# Patient Record
Sex: Male | Born: 1945
Health system: Southern US, Community
[De-identification: ages and names within clinical notes are randomized; demographics above are authoritative.]

## PROBLEM LIST (undated history)

## (undated) DIAGNOSIS — K2270 Barrett's esophagus without dysplasia: Secondary | ICD-10-CM

## (undated) DIAGNOSIS — Z8711 Personal history of peptic ulcer disease: Secondary | ICD-10-CM

## (undated) DIAGNOSIS — I10 Essential (primary) hypertension: Secondary | ICD-10-CM

## (undated) DIAGNOSIS — F32A Depression, unspecified: Secondary | ICD-10-CM

## (undated) DIAGNOSIS — Z87442 Personal history of urinary calculi: Secondary | ICD-10-CM

## (undated) DIAGNOSIS — R7611 Nonspecific reaction to tuberculin skin test without active tuberculosis: Secondary | ICD-10-CM

## (undated) DIAGNOSIS — I2510 Atherosclerotic heart disease of native coronary artery without angina pectoris: Secondary | ICD-10-CM

## (undated) DIAGNOSIS — G4730 Sleep apnea, unspecified: Secondary | ICD-10-CM

## (undated) DIAGNOSIS — N529 Male erectile dysfunction, unspecified: Secondary | ICD-10-CM

## (undated) DIAGNOSIS — I671 Cerebral aneurysm, nonruptured: Secondary | ICD-10-CM

## (undated) DIAGNOSIS — E7800 Pure hypercholesterolemia, unspecified: Secondary | ICD-10-CM

## (undated) DIAGNOSIS — K219 Gastro-esophageal reflux disease without esophagitis: Secondary | ICD-10-CM

## (undated) DIAGNOSIS — K635 Polyp of colon: Secondary | ICD-10-CM

## (undated) DIAGNOSIS — Z8719 Personal history of other diseases of the digestive system: Secondary | ICD-10-CM

## (undated) DIAGNOSIS — N400 Enlarged prostate without lower urinary tract symptoms: Secondary | ICD-10-CM

## (undated) DIAGNOSIS — F419 Anxiety disorder, unspecified: Secondary | ICD-10-CM

## (undated) DIAGNOSIS — N189 Chronic kidney disease, unspecified: Secondary | ICD-10-CM

## (undated) HISTORY — DX: Benign prostatic hyperplasia without lower urinary tract symptoms: N40.0

## (undated) HISTORY — DX: Nonspecific reaction to tuberculin skin test without active tuberculosis: R76.11

## (undated) HISTORY — DX: Personal history of peptic ulcer disease: Z87.11

## (undated) HISTORY — DX: Gastro-esophageal reflux disease without esophagitis: K21.9

## (undated) HISTORY — PX: HERNIA REPAIR: SHX51

## (undated) HISTORY — DX: Anxiety disorder, unspecified: F41.9

## (undated) HISTORY — PX: CATARACT EXTRACTION W/ INTRAOCULAR LENS  IMPLANT, BILATERAL: SHX1307

## (undated) HISTORY — DX: Pure hypercholesterolemia, unspecified: E78.00

## (undated) HISTORY — DX: Polyp of colon: K63.5

## (undated) HISTORY — DX: Personal history of other diseases of the digestive system: Z87.19

## (undated) HISTORY — DX: Male erectile dysfunction, unspecified: N52.9

## (undated) HISTORY — DX: Essential (primary) hypertension: I10

## (undated) HISTORY — DX: Barrett's esophagus without dysplasia: K22.70

---

## 1968-02-08 HISTORY — PX: APPENDECTOMY: SHX54

## 1997-02-07 HISTORY — PX: PROSTATE SURGERY: SHX751

## 1997-10-03 ENCOUNTER — Encounter: Admission: RE | Admit: 1997-10-03 | Discharge: 1997-10-03 | Payer: Self-pay | Admitting: Family Medicine

## 1997-10-20 ENCOUNTER — Encounter: Admission: RE | Admit: 1997-10-20 | Discharge: 1997-10-20 | Payer: Self-pay | Admitting: Family Medicine

## 1997-11-03 ENCOUNTER — Emergency Department (HOSPITAL_COMMUNITY): Admission: EM | Admit: 1997-11-03 | Discharge: 1997-11-03 | Payer: Self-pay | Admitting: Emergency Medicine

## 1998-02-19 ENCOUNTER — Encounter: Payer: Self-pay | Admitting: Urology

## 1998-02-23 ENCOUNTER — Ambulatory Visit (HOSPITAL_COMMUNITY): Admission: RE | Admit: 1998-02-23 | Discharge: 1998-02-23 | Payer: Self-pay | Admitting: Urology

## 2003-07-28 ENCOUNTER — Ambulatory Visit (HOSPITAL_COMMUNITY): Admission: RE | Admit: 2003-07-28 | Discharge: 2003-07-28 | Payer: Self-pay | Admitting: Gastroenterology

## 2003-07-28 ENCOUNTER — Encounter (INDEPENDENT_AMBULATORY_CARE_PROVIDER_SITE_OTHER): Payer: Self-pay | Admitting: Specialist

## 2003-12-07 ENCOUNTER — Emergency Department (HOSPITAL_COMMUNITY): Admission: EM | Admit: 2003-12-07 | Discharge: 2003-12-07 | Payer: Self-pay | Admitting: *Deleted

## 2005-07-13 ENCOUNTER — Ambulatory Visit: Admission: RE | Admit: 2005-07-13 | Discharge: 2005-07-13 | Payer: Self-pay | Admitting: Specialist

## 2008-11-19 ENCOUNTER — Encounter: Admission: RE | Admit: 2008-11-19 | Discharge: 2008-11-19 | Payer: Self-pay | Admitting: Otolaryngology

## 2010-06-25 NOTE — Op Note (Signed)
NAMESAMAJ, WESSELLS                          ACCOUNT NO.:  [id number]   MEDICAL RECORD NO.:  [id number]                   PATIENT TYPE:  AMB   LOCATION:  ENDO                                 FACILITY:  [facility]   PHYSICIAN:  Petra Kuba, M.D.                 DATE OF BIRTH:  [birth year]   DATE OF PROCEDURE:  07/28/2003  DATE OF DISCHARGE:                                 OPERATIVE REPORT   PROCEDURE:  Colonoscopy.   INDICATION:  Polyp on flexible sigmoidoscopy.  Consent was signed after  risks, benefits, methods, options thoroughly discussed in the office.   MEDICINES USED:  1. Demerol 60.  2. Versed 6.   DESCRIPTION OF PROCEDURE:  Rectal inspection was pertinent for external  hemorrhoids, small.  Digital exam was negative.  The video colonoscope was  inserted, easily advanced around the colon to the cecum.  This did require  some abdominal pressure but no position changes.  On insertion in the  rectum, a small polyp was seen, possibly the one seen by Dr. Janey Greaser but no  other abnormalities but some left-sided diverticula.  The cecum was  identified by the appendiceal orifice and the ileocecal valve.  The prep was  adequate.  There was some liquid stool that required washing and suctioning.  On slow withdrawal through the colon, the appendiceal orifice possibly had a  little bit of polypoid tissue there, and a few cold biopsies were obtained  just to make sure.  The scope was further withdrawn.  In the ascending  colon, 2 polyps on folds right next to each other were seen and each hot  biopsied x 1 or 2 and put in a second container.  The scope was further  withdrawn.  The transverse was normal.  In the descending and proximal  sigmoid, 2 questionable tiny polyps were seen and were both hot biopsied and  put in a third container.  The scope was withdrawn back to the distal  sigmoid, and multiple tiny to small probable hyperplastic-appearing polyps  were seen and were all hot  biopsied and put in the fourth container.  No  other polypoid lesions were seen.  One of these was probably the one seen by  Dr. Janey Greaser on his sigmoidoscopy.  Anorectal pull-through and retroflexion  confirmed some small hemorrhoids.  The scope was reinserted a short ways up  the left side of the colon; air was suctioned and the scope removed.  The  patient tolerated the procedure well.  There was no obvious immediate  complication.   ENDOSCOPIC DIAGNOSES:  1. Internal and external hemorrhoids.  2. Left-sided diverticula.  3. Few rectal and distal sigmoid tiny to small polyps, hot biopsied.  4. Two tiny proximal sigmoid and mid descending questionable polyps, hot     biopsied.  5. Ascending 2 small polyps, hot biopsied.  6. Questionable appendiceal orifice with polypoid tissue, cold biopsied.  7. Otherwise, within normal limits to the cecum.   PLAN:  1. Await pathology to determine future colonic screening.  2. Otherwise, return care to Dr. Janey Greaser for the customary health care     maintenance to include yearly rectals and guaiacs.  3. Otherwise, happy to see back p.r.n.Petra Kuba, M.D.    MEM/MEDQ  D:  07/28/2003  T:  07/28/2003  Job:  [id number]   cc:   Al Decant. Janey Greaser, MD  [address]  [place]  [place] [address]  Fax: [phone]

## 2011-04-14 ENCOUNTER — Other Ambulatory Visit: Payer: Self-pay | Admitting: Dermatology

## 2012-10-08 HISTORY — PX: COLONOSCOPY: SHX174

## 2012-10-08 HISTORY — PX: ESOPHAGOGASTRODUODENOSCOPY ENDOSCOPY: SHX5814

## 2012-10-15 LAB — HM COLONOSCOPY

## 2014-03-10 ENCOUNTER — Encounter: Payer: Self-pay | Admitting: *Deleted

## 2014-03-10 ENCOUNTER — Ambulatory Visit (INDEPENDENT_AMBULATORY_CARE_PROVIDER_SITE_OTHER): Payer: 59 | Admitting: Internal Medicine

## 2014-03-10 ENCOUNTER — Encounter: Payer: Self-pay | Admitting: Internal Medicine

## 2014-03-10 VITALS — BP 118/80 | HR 77 | Ht 69.0 in | Wt 178.8 lb

## 2014-03-10 DIAGNOSIS — R0609 Other forms of dyspnea: Secondary | ICD-10-CM

## 2014-03-10 NOTE — Patient Instructions (Signed)
Your physician recommends that you continue on your current medications as directed. Please refer to the Current Medication list given to you today. Your physician recommends that you schedule a follow-up appointment as needed with Dr. Ross.   

## 2014-03-10 NOTE — Progress Notes (Signed)
HPI Patient is a 69 yo who is referred for evaluation of fatigue  Different from what she has experienced in past.  Increased DOE After work gets home  Exhausted  Wants to sleep  Doesn't   Has cramps/pain in L leg (thigh) when does stairs.   Since seen by Dr Harrington Challenger has [facility]  Does troined [facility] 3.5 to 4 for 30 min   Does do some jogging on track Then gets on to  wts   Doing well with this   Labs done in Dec  CBC, TSH normla  Cr 1.5   Allergies  Allergen Reactions  . Penicillins Rash    Current Outpatient Prescriptions  Medication Sig Dispense Refill  . atorvastatin (LIPITOR) 40 MG tablet Take 40 mg by mouth daily.    . calcium carbonate (TUMS - DOSED IN MG ELEMENTAL CALCIUM) 500 MG chewable tablet Chew 1 tablet by mouth as needed for indigestion or heartburn.    . clonazePAM (KLONOPIN) 1 MG tablet Take 1 mg by mouth 2 (two) times daily. Take 1/2 to 1 tablet every 12 hours as needed for severe anxiety.    . hydrochlorothiazide (HYDRODIURIL) 25 MG tablet Take 25 mg by mouth daily.    Marland Kitchen ibuprofen (ADVIL,MOTRIN) 200 MG tablet Take 200 mg by mouth 2 (two) times daily. Pt states that he takes 2 tablets twice daily.    . Multiple Vitamin (MULTIVITAMIN) tablet Take 1 tablet by mouth daily.    Marland Kitchen omeprazole (PRILOSEC) 20 MG capsule Take 20 mg by mouth daily.    . tamsulosin (FLOMAX) 0.4 MG CAPS capsule Take 0.4 mg by mouth daily after breakfast.    . zaleplon (SONATA) 10 MG capsule Take 10 mg by mouth at bedtime as needed for sleep.     No current facility-administered medications for this visit.    Past Medical History  Diagnosis Date  . Hypertension   . Hypercholesteremia   . Anxiety   . BPH (benign prostatic hyperplasia)   . Erectile dysfunction   . GERD (gastroesophageal reflux disease)     Past Surgical History  Procedure Laterality Date  . Hernia repair    . Appendectomy    . Esophagogastroduodenoscopy endoscopy  10/2012  . Colonoscopy  10/2012    Family History   Problem Relation Age of Onset  . Aneurysm Father   . Breast cancer Sister   . Colon cancer Sister     History   Social History  . Marital Status: Married    Spouse Name: pamela    Number of Children: 2  . Years of Education: 12   Occupational History  . [employer].    Social History Main Topics  . Smoking status: Former Smoker    Quit date: 03/10/1996  . Smokeless tobacco: Not on file  . Alcohol Use: No  . Drug Use: No  . Sexual Activity: Not on file   Other Topics Concern  . Not on file   Social History Narrative    Review of Systems:  All systems reviewed.  They are negative to the above problem except as previously stated.  Vital Signs: BP 118/80 mmHg  Pulse 77  Ht 5\' 9"  (1.753 m)  Wt 178 lb 12.8 oz (81.103 kg)  BMI 26.39 kg/m2  Physical Exam  HEENT:  Normocephalic, atraumatic. EOMI, PERRLA.  Neck: JVP is normal.  No bruits.  Lungs: clear to auscultation. No rales no wheezes.  Heart: Regular rate and rhythm. Normal S1, S2.  No S3.   No significant murmurs. PMI not displaced.  Abdomen:  Supple, nontender. Normal bowel sounds. No masses. No hepatomegaly.  Extremities:   Good distal pulses throughout. No lower extremity edema.  Musculoskeletal :moving all extremities.  Neuro:   alert and oriented x3.  CN II-XII grossly intact.  EKG  SR 77 bpm   Assessment and Plan:  1  Fatigue, dyspnea.  I am not convinced cardiac in origin  He is going to gym  Doing OK with this  I would not sched any cardiac testing unless symptoms change  2.  Lipids  Did not get LDL but total chol was 152, HT< 40  Trig 125  Cannot be high     F/U Prn

## 2014-04-07 ENCOUNTER — Encounter: Payer: Self-pay | Admitting: Podiatry

## 2014-04-07 ENCOUNTER — Ambulatory Visit (INDEPENDENT_AMBULATORY_CARE_PROVIDER_SITE_OTHER): Payer: 59 | Admitting: Podiatry

## 2014-04-07 ENCOUNTER — Ambulatory Visit: Payer: 59

## 2014-04-07 ENCOUNTER — Ambulatory Visit (INDEPENDENT_AMBULATORY_CARE_PROVIDER_SITE_OTHER): Payer: 59

## 2014-04-07 VITALS — BP 142/80 | HR 87 | Resp 12

## 2014-04-07 DIAGNOSIS — M779 Enthesopathy, unspecified: Secondary | ICD-10-CM

## 2014-04-07 DIAGNOSIS — R52 Pain, unspecified: Secondary | ICD-10-CM | POA: Diagnosis not present

## 2014-04-07 MED ORDER — DICLOFENAC SODIUM 75 MG PO TBEC: 75.0000 mg | DELAYED_RELEASE_TABLET | Freq: Two times a day (BID) | ORAL | Status: DC

## 2014-04-07 MED ORDER — TRIAMCINOLONE ACETONIDE 10 MG/ML IJ SUSP
10.0000 mg | Freq: Once | INTRAMUSCULAR | Status: AC
  Administered 2014-04-07: 10 mg

## 2014-04-07 NOTE — Progress Notes (Signed)
   Subjective:    Patient ID: John Salazar, male    DOB: 04/04/1945, 69 y.o.   MRN: [id number]  HPI  PT STATED LT LATERAL SIDE OF THE FOOT IS PAINFUL AND HAVE BURNING SENSATION. LT FOOT IS GETTING WORKING, ESPECIALLY WHEN WALKING/STANDING. TRIED NO TREATMENT.  Review of Systems  All other systems reviewed and are negative.      Objective:   Physical Exam        Assessment & Plan:

## 2014-04-08 NOTE — Progress Notes (Signed)
Subjective:     Patient ID: John Salazar, male   DOB: 1945-12-15, 69 y.o.   MRN: [id number]  HPI patient states that both his feet hurt but the left is worse on the outside and he does not remember specific injury. States his orthotics have worn out   Review of Systems  All other systems reviewed and are negative.      Objective:   Physical Exam  Cardiovascular: Intact distal pulses.   Musculoskeletal: Normal range of motion.  Neurological: He is alert.  Skin: Skin is warm and dry.  Nursing note and vitals reviewed.  neurovascular status intact with muscle strength adequate range of motion subtalar midtarsal joint was adequate. Patient's noted to have good digital perfusion and is well oriented 3 and does have mild equinus condition. There is quite a bit of discomfort in the lateral side of the left over right foot proximal to the  metatarsal head with inflammation occurring upon palpation and pain    Assessment:     Probable tendinitis with instability of the foot structure and orthotics which have lost some of their support mechanism    Plan:     H&P and x-rays reviewed. At this point I did careful lateral injection left 3 mg Kenalog 5 mg Xylocaine and scanned for custom orthotics to reduce stress against his feet. Reappoint when they are returned or earlier if any issues should occur

## 2014-04-29 ENCOUNTER — Ambulatory Visit: Payer: 59 | Admitting: *Deleted

## 2014-04-29 DIAGNOSIS — M779 Enthesopathy, unspecified: Secondary | ICD-10-CM

## 2014-04-29 NOTE — Patient Instructions (Signed)

## 2014-04-29 NOTE — Progress Notes (Signed)
Patient ID: John Salazar, male   DOB: [birth year], 69 y.o.   MRN: [id number] PICKING UP INSERTS

## 2014-11-28 ENCOUNTER — Other Ambulatory Visit: Payer: Self-pay | Admitting: Gastroenterology

## 2015-07-23 DIAGNOSIS — H01022 Squamous blepharitis right lower eyelid: Secondary | ICD-10-CM | POA: Diagnosis not present

## 2015-07-23 DIAGNOSIS — H25813 Combined forms of age-related cataract, bilateral: Secondary | ICD-10-CM | POA: Diagnosis not present

## 2015-07-23 DIAGNOSIS — H01021 Squamous blepharitis right upper eyelid: Secondary | ICD-10-CM | POA: Diagnosis not present

## 2015-07-23 DIAGNOSIS — H01024 Squamous blepharitis left upper eyelid: Secondary | ICD-10-CM | POA: Diagnosis not present

## 2015-07-23 DIAGNOSIS — H01025 Squamous blepharitis left lower eyelid: Secondary | ICD-10-CM | POA: Diagnosis not present

## 2015-07-23 DIAGNOSIS — H5703 Miosis: Secondary | ICD-10-CM | POA: Diagnosis not present

## 2015-07-29 DIAGNOSIS — J0100 Acute maxillary sinusitis, unspecified: Secondary | ICD-10-CM | POA: Diagnosis not present

## 2015-08-05 DIAGNOSIS — L821 Other seborrheic keratosis: Secondary | ICD-10-CM | POA: Diagnosis not present

## 2015-08-05 DIAGNOSIS — B351 Tinea unguium: Secondary | ICD-10-CM | POA: Diagnosis not present

## 2015-08-05 DIAGNOSIS — L820 Inflamed seborrheic keratosis: Secondary | ICD-10-CM | POA: Diagnosis not present

## 2015-08-05 DIAGNOSIS — L814 Other melanin hyperpigmentation: Secondary | ICD-10-CM | POA: Diagnosis not present

## 2015-08-05 DIAGNOSIS — D225 Melanocytic nevi of trunk: Secondary | ICD-10-CM | POA: Diagnosis not present

## 2015-08-05 DIAGNOSIS — L570 Actinic keratosis: Secondary | ICD-10-CM | POA: Diagnosis not present

## 2015-08-10 DIAGNOSIS — H2512 Age-related nuclear cataract, left eye: Secondary | ICD-10-CM | POA: Diagnosis not present

## 2015-08-18 DIAGNOSIS — H2511 Age-related nuclear cataract, right eye: Secondary | ICD-10-CM | POA: Diagnosis not present

## 2015-08-24 DIAGNOSIS — H21561 Pupillary abnormality, right eye: Secondary | ICD-10-CM | POA: Diagnosis not present

## 2015-08-24 DIAGNOSIS — H25811 Combined forms of age-related cataract, right eye: Secondary | ICD-10-CM | POA: Diagnosis not present

## 2015-08-24 DIAGNOSIS — H2511 Age-related nuclear cataract, right eye: Secondary | ICD-10-CM | POA: Diagnosis not present

## 2015-08-24 DIAGNOSIS — H2181 Floppy iris syndrome: Secondary | ICD-10-CM | POA: Diagnosis not present

## 2015-09-08 DIAGNOSIS — E559 Vitamin D deficiency, unspecified: Secondary | ICD-10-CM | POA: Diagnosis not present

## 2015-09-08 DIAGNOSIS — Z0000 Encounter for general adult medical examination without abnormal findings: Secondary | ICD-10-CM | POA: Diagnosis not present

## 2015-11-26 DIAGNOSIS — A09 Infectious gastroenteritis and colitis, unspecified: Secondary | ICD-10-CM | POA: Diagnosis not present

## 2015-11-26 DIAGNOSIS — Z86010 Personal history of colonic polyps: Secondary | ICD-10-CM | POA: Diagnosis not present

## 2015-11-26 DIAGNOSIS — K2210 Ulcer of esophagus without bleeding: Secondary | ICD-10-CM | POA: Diagnosis not present

## 2015-11-26 DIAGNOSIS — K210 Gastro-esophageal reflux disease with esophagitis: Secondary | ICD-10-CM | POA: Diagnosis not present

## 2015-12-03 DIAGNOSIS — L918 Other hypertrophic disorders of the skin: Secondary | ICD-10-CM | POA: Diagnosis not present

## 2015-12-03 DIAGNOSIS — B351 Tinea unguium: Secondary | ICD-10-CM | POA: Diagnosis not present

## 2015-12-03 DIAGNOSIS — D225 Melanocytic nevi of trunk: Secondary | ICD-10-CM | POA: Diagnosis not present

## 2015-12-03 DIAGNOSIS — L570 Actinic keratosis: Secondary | ICD-10-CM | POA: Diagnosis not present

## 2015-12-03 DIAGNOSIS — L821 Other seborrheic keratosis: Secondary | ICD-10-CM | POA: Diagnosis not present

## 2016-01-20 DIAGNOSIS — E782 Mixed hyperlipidemia: Secondary | ICD-10-CM | POA: Diagnosis not present

## 2016-01-20 DIAGNOSIS — R5383 Other fatigue: Secondary | ICD-10-CM | POA: Diagnosis not present

## 2016-01-20 DIAGNOSIS — M79609 Pain in unspecified limb: Secondary | ICD-10-CM | POA: Diagnosis not present

## 2016-01-20 DIAGNOSIS — Z23 Encounter for immunization: Secondary | ICD-10-CM | POA: Diagnosis not present

## 2016-02-04 DIAGNOSIS — W458XXA Other foreign body or object entering through skin, initial encounter: Secondary | ICD-10-CM | POA: Diagnosis not present

## 2016-02-04 DIAGNOSIS — S61209A Unspecified open wound of unspecified finger without damage to nail, initial encounter: Secondary | ICD-10-CM | POA: Diagnosis not present

## 2016-03-15 DIAGNOSIS — M62830 Muscle spasm of back: Secondary | ICD-10-CM | POA: Diagnosis not present

## 2016-03-15 DIAGNOSIS — G44209 Tension-type headache, unspecified, not intractable: Secondary | ICD-10-CM | POA: Diagnosis not present

## 2016-04-11 DIAGNOSIS — N401 Enlarged prostate with lower urinary tract symptoms: Secondary | ICD-10-CM | POA: Diagnosis not present

## 2016-04-18 DIAGNOSIS — N5201 Erectile dysfunction due to arterial insufficiency: Secondary | ICD-10-CM | POA: Diagnosis not present

## 2016-04-18 DIAGNOSIS — N400 Enlarged prostate without lower urinary tract symptoms: Secondary | ICD-10-CM | POA: Diagnosis not present

## 2016-04-23 DIAGNOSIS — J069 Acute upper respiratory infection, unspecified: Secondary | ICD-10-CM | POA: Diagnosis not present

## 2016-05-23 DIAGNOSIS — J309 Allergic rhinitis, unspecified: Secondary | ICD-10-CM | POA: Diagnosis not present

## 2016-05-23 DIAGNOSIS — I10 Essential (primary) hypertension: Secondary | ICD-10-CM | POA: Diagnosis not present

## 2016-05-23 DIAGNOSIS — R350 Frequency of micturition: Secondary | ICD-10-CM | POA: Diagnosis not present

## 2016-05-23 DIAGNOSIS — E782 Mixed hyperlipidemia: Secondary | ICD-10-CM | POA: Diagnosis not present

## 2016-05-24 DIAGNOSIS — M79609 Pain in unspecified limb: Secondary | ICD-10-CM | POA: Diagnosis not present

## 2016-05-24 DIAGNOSIS — Z23 Encounter for immunization: Secondary | ICD-10-CM | POA: Diagnosis not present

## 2016-05-24 DIAGNOSIS — R5383 Other fatigue: Secondary | ICD-10-CM | POA: Diagnosis not present

## 2016-05-24 DIAGNOSIS — E782 Mixed hyperlipidemia: Secondary | ICD-10-CM | POA: Diagnosis not present

## 2016-06-02 DIAGNOSIS — L570 Actinic keratosis: Secondary | ICD-10-CM | POA: Diagnosis not present

## 2016-06-02 DIAGNOSIS — L820 Inflamed seborrheic keratosis: Secondary | ICD-10-CM | POA: Diagnosis not present

## 2016-06-02 DIAGNOSIS — B351 Tinea unguium: Secondary | ICD-10-CM | POA: Diagnosis not present

## 2016-06-02 DIAGNOSIS — L814 Other melanin hyperpigmentation: Secondary | ICD-10-CM | POA: Diagnosis not present

## 2016-06-02 DIAGNOSIS — L821 Other seborrheic keratosis: Secondary | ICD-10-CM | POA: Diagnosis not present

## 2016-08-24 DIAGNOSIS — G8929 Other chronic pain: Secondary | ICD-10-CM | POA: Diagnosis not present

## 2016-08-24 DIAGNOSIS — M170 Bilateral primary osteoarthritis of knee: Secondary | ICD-10-CM | POA: Diagnosis not present

## 2016-08-24 DIAGNOSIS — M25562 Pain in left knee: Secondary | ICD-10-CM | POA: Diagnosis not present

## 2016-10-13 DIAGNOSIS — I10 Essential (primary) hypertension: Secondary | ICD-10-CM | POA: Diagnosis not present

## 2016-10-13 DIAGNOSIS — Z125 Encounter for screening for malignant neoplasm of prostate: Secondary | ICD-10-CM | POA: Diagnosis not present

## 2016-10-13 DIAGNOSIS — N400 Enlarged prostate without lower urinary tract symptoms: Secondary | ICD-10-CM | POA: Diagnosis not present

## 2016-10-13 DIAGNOSIS — E782 Mixed hyperlipidemia: Secondary | ICD-10-CM | POA: Diagnosis not present

## 2016-10-13 DIAGNOSIS — Z23 Encounter for immunization: Secondary | ICD-10-CM | POA: Diagnosis not present

## 2016-10-13 DIAGNOSIS — E559 Vitamin D deficiency, unspecified: Secondary | ICD-10-CM | POA: Diagnosis not present

## 2016-10-13 DIAGNOSIS — Z0000 Encounter for general adult medical examination without abnormal findings: Secondary | ICD-10-CM | POA: Diagnosis not present

## 2016-11-29 DIAGNOSIS — H8109 Meniere's disease, unspecified ear: Secondary | ICD-10-CM | POA: Diagnosis not present

## 2016-11-29 DIAGNOSIS — I10 Essential (primary) hypertension: Secondary | ICD-10-CM | POA: Diagnosis not present

## 2016-11-29 DIAGNOSIS — M549 Dorsalgia, unspecified: Secondary | ICD-10-CM | POA: Diagnosis not present

## 2016-11-29 DIAGNOSIS — R21 Rash and other nonspecific skin eruption: Secondary | ICD-10-CM | POA: Diagnosis not present

## 2016-12-01 DIAGNOSIS — C44329 Squamous cell carcinoma of skin of other parts of face: Secondary | ICD-10-CM | POA: Diagnosis not present

## 2016-12-01 DIAGNOSIS — D485 Neoplasm of uncertain behavior of skin: Secondary | ICD-10-CM | POA: Diagnosis not present

## 2016-12-01 DIAGNOSIS — D225 Melanocytic nevi of trunk: Secondary | ICD-10-CM | POA: Diagnosis not present

## 2016-12-01 DIAGNOSIS — L218 Other seborrheic dermatitis: Secondary | ICD-10-CM | POA: Diagnosis not present

## 2016-12-01 DIAGNOSIS — L570 Actinic keratosis: Secondary | ICD-10-CM | POA: Diagnosis not present

## 2016-12-01 DIAGNOSIS — L578 Other skin changes due to chronic exposure to nonionizing radiation: Secondary | ICD-10-CM | POA: Diagnosis not present

## 2016-12-01 DIAGNOSIS — L821 Other seborrheic keratosis: Secondary | ICD-10-CM | POA: Diagnosis not present

## 2016-12-01 DIAGNOSIS — L814 Other melanin hyperpigmentation: Secondary | ICD-10-CM | POA: Diagnosis not present

## 2016-12-01 DIAGNOSIS — L820 Inflamed seborrheic keratosis: Secondary | ICD-10-CM | POA: Diagnosis not present

## 2016-12-13 DIAGNOSIS — L218 Other seborrheic dermatitis: Secondary | ICD-10-CM | POA: Diagnosis not present

## 2016-12-13 DIAGNOSIS — C44320 Squamous cell carcinoma of skin of unspecified parts of face: Secondary | ICD-10-CM | POA: Diagnosis not present

## 2016-12-13 DIAGNOSIS — L570 Actinic keratosis: Secondary | ICD-10-CM | POA: Diagnosis not present

## 2017-02-09 DIAGNOSIS — A09 Infectious gastroenteritis and colitis, unspecified: Secondary | ICD-10-CM | POA: Diagnosis not present

## 2017-02-09 DIAGNOSIS — Z86010 Personal history of colonic polyps: Secondary | ICD-10-CM | POA: Diagnosis not present

## 2017-02-09 DIAGNOSIS — K2210 Ulcer of esophagus without bleeding: Secondary | ICD-10-CM | POA: Diagnosis not present

## 2017-02-14 DIAGNOSIS — L578 Other skin changes due to chronic exposure to nonionizing radiation: Secondary | ICD-10-CM | POA: Diagnosis not present

## 2017-02-14 DIAGNOSIS — Z85828 Personal history of other malignant neoplasm of skin: Secondary | ICD-10-CM | POA: Diagnosis not present

## 2017-02-22 DIAGNOSIS — H4911 Fourth [trochlear] nerve palsy, right eye: Secondary | ICD-10-CM | POA: Diagnosis not present

## 2017-02-22 DIAGNOSIS — H532 Diplopia: Secondary | ICD-10-CM | POA: Diagnosis not present

## 2017-02-22 DIAGNOSIS — H5021 Vertical strabismus, right eye: Secondary | ICD-10-CM | POA: Diagnosis not present

## 2017-02-22 DIAGNOSIS — Z961 Presence of intraocular lens: Secondary | ICD-10-CM | POA: Diagnosis not present

## 2017-03-02 DIAGNOSIS — N189 Chronic kidney disease, unspecified: Secondary | ICD-10-CM | POA: Diagnosis not present

## 2017-03-29 DIAGNOSIS — L570 Actinic keratosis: Secondary | ICD-10-CM | POA: Diagnosis not present

## 2017-04-19 ENCOUNTER — Telehealth: Payer: Self-pay | Admitting: Neurology

## 2017-04-19 ENCOUNTER — Encounter (INDEPENDENT_AMBULATORY_CARE_PROVIDER_SITE_OTHER): Payer: Self-pay

## 2017-04-19 ENCOUNTER — Encounter: Payer: Self-pay | Admitting: Neurology

## 2017-04-19 ENCOUNTER — Ambulatory Visit (INDEPENDENT_AMBULATORY_CARE_PROVIDER_SITE_OTHER): Payer: PPO | Admitting: Neurology

## 2017-04-19 VITALS — BP 124/78 | HR 88 | Ht 69.0 in | Wt 173.0 lb

## 2017-04-19 DIAGNOSIS — H4912 Fourth [trochlear] nerve palsy, left eye: Secondary | ICD-10-CM | POA: Diagnosis not present

## 2017-04-19 DIAGNOSIS — H532 Diplopia: Secondary | ICD-10-CM

## 2017-04-19 DIAGNOSIS — H499 Unspecified paralytic strabismus: Secondary | ICD-10-CM

## 2017-04-19 NOTE — Telephone Encounter (Signed)
John Salazar [phone] said if another form was sent in that would mean another auth but they don't require an [place]. She is requesting a call back, please do not fax anything. Please call asap

## 2017-04-19 NOTE — Telephone Encounter (Signed)
Faxed the form back with Dr. Jaynee Eagles NPI and GI NPI.

## 2017-04-19 NOTE — Telephone Encounter (Signed)
I spoke to [facility] and gave her Dr. Jaynee Eagles NPI and [facility] NPI for the approvals for the images.

## 2017-04-19 NOTE — Progress Notes (Signed)
[facility]    Provider:  Dr Jaynee Eagles Referring Provider: Lawerance Cruel, MD Primary Care Physician:  John Cruel, MD  CC:  Left 4th nerve palsy  HPI:  John Salazar is a 72 y.o. male here as a referral from Dr. Katy Salazar for diplopia. PMHx HTN, meniere's, HLD. When he looks up at church he has double vision. He has to tilt to the left to see the TV sometimes. Worse when tired because he really has to focus. No trauma or inciting event. May have been "forever" for these symptoms. Could have been ongoing for years. Not worsening. Stable. He also notices it when playing golf. He feels his eyes get tired. It is constant, continuous, not dependent on time of day. No facial weakness, no difficulty speaking or swallowing, no numbness, no tingling, no focal weakness or any other problems. His eyes are tired when he moves them. Not painful. It does make him dizzy if moves eyes very quickly. No other focal neurologic deficits, associated symptoms, inciting events or modifiable factors. No fevers. No headache.   Reviewed notes, labs and imaging from outside physicians, which showed:  Reviewed referring physician notes.  Patient presented with double vision especially when looking up.  If he stares really hard his eyes get tired, his eyes seemed not to be in sink when he is looking peripherally in any direction, also complains of not being able to focus on things when his head is in motion.  No associated symptoms.  Are unchanged or unaffected by nothing.  Occur while looking at screens at church and golfing.  Reviewed exam which showed best corrected vision 20/20 OU, pupils equally round and reactive, full motility, full confrontation, normal orbits, head test tilt test showed a definite right 4th nerve palsy normal 5th and 7th nerve palsy.  Slit-lamp exam funduscopic exam are unremarkable.  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  Review of  Systems: Patient complains of symptoms per HPI as well as the following symptoms: headache. Pertinent negatives and positives per HPI. All others negative.   Social History   Socioeconomic History  . Marital status: Married    Spouse name: John Salazar  . Number of children: 2  . Years of education: 19  . Highest education level: Not on file  Social Needs  . Financial resource strain: Not on file  . Food insecurity - worry: Not on file  . Food insecurity - inability: Not on file  . Transportation needs - medical: Not on file  . Transportation needs - non-medical: Not on file  Occupational History  . Occupation: Oceanographer.  Tobacco Use  . Smoking status: Former Smoker    Last attempt to quit: 03/10/1996    Years since quitting: 21.1  . Smokeless tobacco: Never Used  Substance and Sexual Activity  . Alcohol use: No    Alcohol/week: 0.0 oz  . Drug use: No  . Sexual activity: Not on file  Other Topics Concern  . Not on file  Social History Narrative  . Not on file    Family History  Problem Relation Age of Onset  . Aneurysm Father   . Breast cancer Sister   . Colon cancer Sister     Past Medical History:  Diagnosis Date  . Anxiety   . BPH (benign prostatic hyperplasia)   . Erectile dysfunction   . GERD (gastroesophageal reflux disease)   . Hypercholesteremia   . Hypertension  Past Surgical History:  Procedure Laterality Date  . APPENDECTOMY    . COLONOSCOPY  10/2012  . ESOPHAGOGASTRODUODENOSCOPY ENDOSCOPY  10/2012  . HERNIA REPAIR    . PROSTATE SURGERY     20 years ago    Current Outpatient Medications  Medication Sig Dispense Refill  . acetaminophen (TYLENOL) 500 MG tablet Take 500 mg by mouth every 6 (six) hours as needed.    . B Complex Vitamins (VITAMIN B COMPLEX PO) Take 1 tablet by mouth daily.     Marland Kitchen dicyclomine (BENTYL) 20 MG tablet Take 20 mg by mouth every 6 (six) hours as needed.     . ezetimibe (ZETIA) 10 MG tablet Take 10 mg by mouth  daily.    . irbesartan-hydrochlorothiazide (AVALIDE) 150-12.5 MG tablet Take 1 tablet by mouth daily.    Marland Kitchen omeprazole (PRILOSEC) 20 MG capsule Take 40 mg by mouth daily.     . simethicone (MYLICON) 244 MG chewable tablet Chew 125 mg by mouth every 6 (six) hours as needed for flatulence.    . tamsulosin (FLOMAX) 0.4 MG CAPS capsule Take 0.4 mg by mouth daily after breakfast.     No current facility-administered medications for this visit.     Allergies as of 04/19/2017 - Review Complete 04/19/2017  Allergen Reaction Noted  . Bee venom  04/18/2017  . Penicillins Rash 03/10/2014    Vitals: BP 124/78   Pulse 88   Ht 5' 9" (1.753 m)   Wt 173 lb (78.5 kg)   BMI 25.55 kg/m  Last Weight:  Wt Readings from Last 1 Encounters:  04/19/17 173 lb (78.5 kg)   Last Height:   Ht Readings from Last 1 Encounters:  04/19/17 5' 9" (1.753 m)    Physical exam: Exam: Gen: NAD, conversant, well nourised, well groomed                     CV: RRR, no MRG. No Carotid Bruits. No peripheral edema, warm, nontender Eyes: Conjunctivae clear without exudates or hemorrhage  Neuro: Detailed Neurologic Exam  Speech:    Speech is normal; fluent and spontaneous with normal comprehension.  Cognition:    The patient is oriented to person, place, and time;     recent and remote memory intact;     language fluent;     normal attention, concentration,     fund of knowledge Cranial Nerves:    The pupils are equal, round, and reactive to light. The fundi are normal and spontaneous venous pulsations are present. Visual fields are full to finger confrontation. Left 4th nerve palsy. Trigeminal sensation is intact and the muscles of mastication are normal. The face is symmetric. The palate elevates in the midline. Hearing intact. Voice is normal. Shoulder shrug is normal. The tongue has normal motion without fasciculations.   Coordination:    Normal finger to nose and heel to shin. Normal rapid alternating  movements.   Gait:    Heel-toe and tandem gait are normal.   Motor Observation:    No asymmetry, no atrophy, and no involuntary movements noted. Tone:    Normal muscle tone.    Posture:    Posture is normal. normal erect    Strength:    Strength is V/V in the upper and lower limbs.      Sensation: intact to LT     Reflex Exam:  DTR's:    Deep tendon reflexes in the upper and lower extremities are normal bilaterally.   Toes:  The toes are downgoing bilaterally.   Clonus:    Clonus is absent.      Assessment/Plan:  Lovely gentleman with left 4th nerve palsy  MRI of the brain and orbits to rule out lesions or tumors (schwannoma), increased pressure, space occupying or compressive masses, strokes, pseudotumor cerebri and other etiologies Esr/crp but unlikely [facility] Myasthenia Gravis Labs May be microvascular or congenital, will check HGBa1c If symptoms worsen need to follow up in the office. Discussed differential including MG, go to the ED for acute worsening or breathing problems.  Orders Placed This Encounter  Procedures  . MR BRAIN W WO CONTRAST  . MR ORBITS W WO CONTRAST  . Hemoglobin A1c  . Acetylcholine receptor, binding  . Acetylcholine receptor, blocking  . Acetylcholine receptor, modulating  . Basic Metabolic Panel  . Sedimentation rate  . C-reactive protein   CC: Dr. Harrington Challenger, Dr. Clent Jacks     Sarina Ill, MD  [facility] [address] [place], [place] [address]  Phone [phone] Fax [phone]

## 2017-04-19 NOTE — Patient Instructions (Addendum)
Labs MRI brain If symptoms worsen call office or email

## 2017-04-19 NOTE — Telephone Encounter (Signed)
Sherry from [employer] has called to inform Raquel Sarna that a PA is not required for procedure.  Judeen Hammans needs to know if Raquel Sarna want to withdraw or if she wants to proceed.  Judeen Hammans states if Raquel Sarna wants to proceed  Judeen Hammans will need Dr Cathren Laine 1st name & NPI in addiiton to [facility] NPI please call

## 2017-04-19 NOTE — Telephone Encounter (Signed)
04/19/17 Health team faxed clinical notes order sent to GI EE

## 2017-04-27 LAB — BASIC METABOLIC PANEL
BUN / CREAT RATIO: 13 (ref 10–24)
BUN: 19 mg/dL (ref 8–27)
CO2: 24 mmol/L (ref 20–29)
Calcium: 10.4 mg/dL — ABNORMAL HIGH (ref 8.6–10.2)
Chloride: 102 mmol/L (ref 96–106)
Creatinine, Ser: 1.46 mg/dL — ABNORMAL HIGH (ref 0.76–1.27)
GFR calc non Af Amer: 47 mL/min/{1.73_m2} — ABNORMAL LOW (ref >59)
GFR, EST AFRICAN AMERICAN: 55 mL/min/{1.73_m2} — AB (ref >59)
Glucose: 90 mg/dL (ref 65–99)
POTASSIUM: 4.1 mmol/L (ref 3.5–5.2)
SODIUM: 141 mmol/L (ref 134–144)

## 2017-04-27 LAB — HEMOGLOBIN A1C
ESTIMATED AVERAGE GLUCOSE: 117 mg/dL
HEMOGLOBIN A1C: 5.7 % — AB (ref 4.8–5.6)

## 2017-04-27 LAB — ACETYLCHOLINE RECEPTOR, BINDING

## 2017-04-27 LAB — ACETYLCHOLINE RECEPTOR, BLOCKING: ACETYLCHOL BLOCK AB: 12 % (ref 0–25)

## 2017-04-27 LAB — SEDIMENTATION RATE: Sed Rate: 8 mm/hr (ref 0–30)

## 2017-04-27 LAB — C-REACTIVE PROTEIN: CRP: 2 mg/L (ref 0.0–4.9)

## 2017-04-27 LAB — ACETYLCHOLINE RECEPTOR, MODULATING: Acetylcholine Modulat Ab: <12 % (ref 0–20)

## 2017-04-29 ENCOUNTER — Ambulatory Visit
Admission: RE | Admit: 2017-04-29 | Discharge: 2017-04-29 | Disposition: A | Discharge status: Home / Self Care | Payer: 59 | Source: Ambulatory Visit | Attending: Neurology | Admitting: Neurology

## 2017-04-29 DIAGNOSIS — H532 Diplopia: Secondary | ICD-10-CM

## 2017-04-29 DIAGNOSIS — H499 Unspecified paralytic strabismus: Secondary | ICD-10-CM | POA: Diagnosis not present

## 2017-04-29 MED ORDER — GADOBENATE DIMEGLUMINE 529 MG/ML IV SOLN
15.0000 mL | Freq: Once | INTRAVENOUS | Status: AC | PRN
  Administered 2017-04-29: 15 mL via INTRAVENOUS

## 2017-05-02 ENCOUNTER — Telehealth: Payer: Self-pay | Admitting: *Deleted

## 2017-05-02 NOTE — Telephone Encounter (Signed)
Spoke with patient regarding lab results. Patient was made aware that his labs are unremarkable. His creatinine is elevated but Dr. Jaynee Eagles believes he told her that he has a history of chronic kidney disease. If he does not have a hx of this he should follow up with his pcp. Pt reported that he was told on more than one occasion that his kidney level was up and then was told later that he needed to stop taking Advil and that he was stage 3 or 4. Pt did not know any further information and stated that he had questions for his PCP. RN encouraged pt to call PCP. He verbalized understanding and appreciation.

## 2017-05-02 NOTE — Telephone Encounter (Signed)
Called pt & discussed results of MRI brain & orbits. He is aware that the scans are unremarkable and he verbalized understanding. He requested that the results be faxed to his PCP Dr. Harrington Challenger which RN did at the end of the call. He will be seeing Dr. Harrington Challenger to f/u on his kidney function on 4/2. He had no further questions.    Notes recorded by Melvenia Beam, MD on 05/02/2017 at 1:12 PM EDT Unremarkable MRIs of the brain and orbits, thanks

## 2017-05-02 NOTE — Telephone Encounter (Signed)
-----   Message from Melvenia Beam, MD sent at 04/27/2017 12:18 PM EDT ----- Labs unremarkable. His creatinine is elevated but I believe he told me has a history of chronic kidney disease. I looked in his records however and did not see that, please verify. If he does not have a hx of this he should follow up with his pcp. thanks

## 2017-05-09 DIAGNOSIS — H532 Diplopia: Secondary | ICD-10-CM | POA: Diagnosis not present

## 2017-05-09 DIAGNOSIS — N183 Chronic kidney disease, stage 3 (moderate): Secondary | ICD-10-CM | POA: Diagnosis not present

## 2017-06-12 DIAGNOSIS — H8110 Benign paroxysmal vertigo, unspecified ear: Secondary | ICD-10-CM | POA: Diagnosis not present

## 2017-06-12 DIAGNOSIS — H8109 Meniere's disease, unspecified ear: Secondary | ICD-10-CM | POA: Diagnosis not present

## 2017-06-12 DIAGNOSIS — I10 Essential (primary) hypertension: Secondary | ICD-10-CM | POA: Diagnosis not present

## 2017-06-13 DIAGNOSIS — Z961 Presence of intraocular lens: Secondary | ICD-10-CM | POA: Diagnosis not present

## 2017-06-13 DIAGNOSIS — H4911 Fourth [trochlear] nerve palsy, right eye: Secondary | ICD-10-CM | POA: Diagnosis not present

## 2017-06-13 DIAGNOSIS — H5021 Vertical strabismus, right eye: Secondary | ICD-10-CM | POA: Diagnosis not present

## 2017-06-13 DIAGNOSIS — H5213 Myopia, bilateral: Secondary | ICD-10-CM | POA: Diagnosis not present

## 2017-06-13 DIAGNOSIS — H532 Diplopia: Secondary | ICD-10-CM | POA: Diagnosis not present

## 2017-06-14 DIAGNOSIS — H5213 Myopia, bilateral: Secondary | ICD-10-CM | POA: Diagnosis not present

## 2017-06-26 ENCOUNTER — Telehealth: Payer: Self-pay | Admitting: Neurology

## 2017-06-26 DIAGNOSIS — R42 Dizziness and giddiness: Secondary | ICD-10-CM | POA: Diagnosis not present

## 2017-06-26 DIAGNOSIS — R2689 Other abnormalities of gait and mobility: Secondary | ICD-10-CM | POA: Insufficient documentation

## 2017-06-26 NOTE — Telephone Encounter (Signed)
Patient says PCP referred him to  Dr. Redmond Baseman who is an ENT physician for balance issues but Dr Redmond Baseman says he should see Dr. Jaynee Eagles for balance issues because of MRI results. Patient saw Dr. Jaynee Eagles 04-19-17 for double vision issues. Please call and discuss.

## 2017-06-26 NOTE — Telephone Encounter (Signed)
I don't know if I should see him or not, I have no idea what he means by balance issues. He is welcome to follow up here with me however again, I don;t know what is going on so I may or may not be able to help. But happy to see him and evaluate him

## 2017-06-28 NOTE — Telephone Encounter (Signed)
Called patient. He said since he was seen for his double vision, he has had a leaning balance issue. He saw ENT and was recommended to see Dr. Jaynee Eagles and review MRI. Discussed with Dr. Jaynee Eagles. Can see in office. Patient scheduled for 8.5.19 @ 8:30 arrival time 8:00. Pt verbalized appreciation. He was also put on the wait per request in case of any cancellations. He had asked if he could see another doctor sooner. RN advised this would have to approved by both Dr. Jaynee Eagles and receiving doctor since he is established with Dr. Jaynee Eagles. Pt declined and said he would keep the August appt with Dr. Jaynee Eagles.

## 2017-07-19 DIAGNOSIS — R930 Abnormal findings on diagnostic imaging of skull and head, not elsewhere classified: Secondary | ICD-10-CM | POA: Diagnosis not present

## 2017-07-19 DIAGNOSIS — L237 Allergic contact dermatitis due to plants, except food: Secondary | ICD-10-CM | POA: Diagnosis not present

## 2017-07-19 DIAGNOSIS — R42 Dizziness and giddiness: Secondary | ICD-10-CM | POA: Diagnosis not present

## 2017-07-21 ENCOUNTER — Other Ambulatory Visit: Payer: Self-pay | Admitting: Family Medicine

## 2017-07-21 DIAGNOSIS — R930 Abnormal findings on diagnostic imaging of skull and head, not elsewhere classified: Secondary | ICD-10-CM

## 2017-07-29 ENCOUNTER — Ambulatory Visit
Admission: RE | Admit: 2017-07-29 | Discharge: 2017-07-29 | Disposition: A | Discharge status: Home / Self Care | Payer: PPO | Source: Ambulatory Visit | Attending: Family Medicine | Admitting: Family Medicine

## 2017-07-29 DIAGNOSIS — H532 Diplopia: Secondary | ICD-10-CM | POA: Diagnosis not present

## 2017-07-29 DIAGNOSIS — R930 Abnormal findings on diagnostic imaging of skull and head, not elsewhere classified: Secondary | ICD-10-CM

## 2017-07-29 DIAGNOSIS — R270 Ataxia, unspecified: Secondary | ICD-10-CM | POA: Diagnosis not present

## 2017-08-03 ENCOUNTER — Other Ambulatory Visit (HOSPITAL_COMMUNITY): Payer: Self-pay | Admitting: Interventional Radiology

## 2017-08-03 DIAGNOSIS — R9089 Other abnormal findings on diagnostic imaging of central nervous system: Secondary | ICD-10-CM

## 2017-08-07 ENCOUNTER — Other Ambulatory Visit: Payer: Self-pay | Admitting: Radiology

## 2017-08-08 ENCOUNTER — Other Ambulatory Visit: Payer: Self-pay | Admitting: Radiology

## 2017-08-09 ENCOUNTER — Ambulatory Visit (HOSPITAL_COMMUNITY)
Admission: RE | Admit: 2017-08-09 | Discharge: 2017-08-09 | Disposition: A | Discharge status: Home / Self Care | Payer: PPO | Source: Ambulatory Visit | Attending: Interventional Radiology | Admitting: Interventional Radiology

## 2017-08-09 ENCOUNTER — Encounter (HOSPITAL_COMMUNITY): Payer: Self-pay

## 2017-08-09 ENCOUNTER — Other Ambulatory Visit (HOSPITAL_COMMUNITY): Payer: Self-pay | Admitting: Interventional Radiology

## 2017-08-09 DIAGNOSIS — I671 Cerebral aneurysm, nonruptured: Secondary | ICD-10-CM | POA: Diagnosis not present

## 2017-08-09 DIAGNOSIS — F419 Anxiety disorder, unspecified: Secondary | ICD-10-CM | POA: Insufficient documentation

## 2017-08-09 DIAGNOSIS — Z7982 Long term (current) use of aspirin: Secondary | ICD-10-CM | POA: Insufficient documentation

## 2017-08-09 DIAGNOSIS — R9089 Other abnormal findings on diagnostic imaging of central nervous system: Secondary | ICD-10-CM

## 2017-08-09 DIAGNOSIS — I10 Essential (primary) hypertension: Secondary | ICD-10-CM | POA: Insufficient documentation

## 2017-08-09 DIAGNOSIS — Z880 Allergy status to penicillin: Secondary | ICD-10-CM | POA: Insufficient documentation

## 2017-08-09 DIAGNOSIS — Z87891 Personal history of nicotine dependence: Secondary | ICD-10-CM | POA: Diagnosis not present

## 2017-08-09 DIAGNOSIS — I6523 Occlusion and stenosis of bilateral carotid arteries: Secondary | ICD-10-CM | POA: Insufficient documentation

## 2017-08-09 DIAGNOSIS — I6601 Occlusion and stenosis of right middle cerebral artery: Secondary | ICD-10-CM | POA: Diagnosis not present

## 2017-08-09 DIAGNOSIS — E7800 Pure hypercholesterolemia, unspecified: Secondary | ICD-10-CM | POA: Insufficient documentation

## 2017-08-09 DIAGNOSIS — G450 Vertebro-basilar artery syndrome: Secondary | ICD-10-CM | POA: Diagnosis present

## 2017-08-09 DIAGNOSIS — I6501 Occlusion and stenosis of right vertebral artery: Secondary | ICD-10-CM | POA: Diagnosis not present

## 2017-08-09 DIAGNOSIS — K219 Gastro-esophageal reflux disease without esophagitis: Secondary | ICD-10-CM | POA: Diagnosis not present

## 2017-08-09 DIAGNOSIS — N400 Enlarged prostate without lower urinary tract symptoms: Secondary | ICD-10-CM | POA: Diagnosis not present

## 2017-08-09 DIAGNOSIS — I6521 Occlusion and stenosis of right carotid artery: Secondary | ICD-10-CM | POA: Diagnosis not present

## 2017-08-09 HISTORY — PX: IR ANGIO VERTEBRAL SEL VERTEBRAL BILAT MOD SED: IMG5369

## 2017-08-09 HISTORY — PX: IR ANGIO INTRA EXTRACRAN SEL COM CAROTID INNOMINATE BILAT MOD SED: IMG5360

## 2017-08-09 LAB — BASIC METABOLIC PANEL
ANION GAP: 7 (ref 5–15)
BUN: 17 mg/dL (ref 8–23)
CHLORIDE: 106 mmol/L (ref 98–111)
CO2: 28 mmol/L (ref 22–32)
Calcium: 9.7 mg/dL (ref 8.9–10.3)
Creatinine, Ser: 1.52 mg/dL — ABNORMAL HIGH (ref 0.61–1.24)
GFR calc Af Amer: 51 mL/min — ABNORMAL LOW (ref >60)
GFR calc non Af Amer: 44 mL/min — ABNORMAL LOW (ref >60)
GLUCOSE: 94 mg/dL (ref 70–99)
POTASSIUM: 3.8 mmol/L (ref 3.5–5.1)
Sodium: 141 mmol/L (ref 135–145)

## 2017-08-09 LAB — CBC
HEMATOCRIT: 44.1 % (ref 39.0–52.0)
HEMOGLOBIN: 14.7 g/dL (ref 13.0–17.0)
MCH: 30.4 pg (ref 26.0–34.0)
MCHC: 33.3 g/dL (ref 30.0–36.0)
MCV: 91.3 fL (ref 78.0–100.0)
Platelets: 182 10*3/uL (ref 150–400)
RBC: 4.83 MIL/uL (ref 4.22–5.81)
RDW: 12.7 % (ref 11.5–15.5)
WBC: 5.9 10*3/uL (ref 4.0–10.5)

## 2017-08-09 LAB — PROTIME-INR
INR: 1.03
Prothrombin Time: 13.4 seconds (ref 11.4–15.2)

## 2017-08-09 MED ORDER — IOHEXOL 300 MG/ML  SOLN
150.0000 mL | Freq: Once | INTRAMUSCULAR | Status: AC | PRN
Start: 2017-08-09 — End: 2017-08-09
  Administered 2017-08-09: 85 mL via INTRA_ARTERIAL

## 2017-08-09 MED ORDER — MIDAZOLAM HCL 2 MG/2ML IJ SOLN
INTRAMUSCULAR | Status: AC
  Filled 2017-08-09: qty 2

## 2017-08-09 MED ORDER — HEPARIN SODIUM (PORCINE) 1000 UNIT/ML IJ SOLN
INTRAMUSCULAR | Status: AC
  Filled 2017-08-09: qty 1

## 2017-08-09 MED ORDER — LIDOCAINE HCL 1 % IJ SOLN
INTRAMUSCULAR | Status: AC
  Filled 2017-08-09: qty 20

## 2017-08-09 MED ORDER — SODIUM CHLORIDE 0.9 % IV SOLN: Freq: Once | INTRAVENOUS | Status: DC

## 2017-08-09 MED ORDER — MIDAZOLAM HCL 2 MG/2ML IJ SOLN
INTRAMUSCULAR | Status: AC | PRN
  Administered 2017-08-09: 1 mg via INTRAVENOUS

## 2017-08-09 MED ORDER — LIDOCAINE HCL (PF) 1 % IJ SOLN
INTRAMUSCULAR | Status: AC | PRN
  Administered 2017-08-09: 10 mL

## 2017-08-09 MED ORDER — SODIUM CHLORIDE 0.9 % IV SOLN: INTRAVENOUS | Status: AC

## 2017-08-09 MED ORDER — IOPAMIDOL (ISOVUE-300) INJECTION 61%
INTRAVENOUS | Status: AC
  Filled 2017-08-09: qty 50

## 2017-08-09 MED ORDER — FENTANYL CITRATE (PF) 100 MCG/2ML IJ SOLN
INTRAMUSCULAR | Status: AC | PRN
  Administered 2017-08-09: 25 ug via INTRAVENOUS

## 2017-08-09 MED ORDER — FENTANYL CITRATE (PF) 100 MCG/2ML IJ SOLN
INTRAMUSCULAR | Status: AC
  Filled 2017-08-09: qty 2

## 2017-08-09 MED ORDER — HEPARIN SODIUM (PORCINE) 1000 UNIT/ML IJ SOLN
INTRAMUSCULAR | Status: AC | PRN
  Administered 2017-08-09: 1000 [IU] via INTRAVENOUS

## 2017-08-09 NOTE — Sedation Documentation (Signed)
IR tech holding manual pressure to right groin at this time.

## 2017-08-09 NOTE — Procedures (Signed)
S/P 4 vessel cerebral arteriogram RT CFA approach. Findings. 1.Approx 80 - 85 % stenosis of prox RT ICA. 2.RT MCA  approx 70 % stenosis prox  3.RT VA approx 70 % stenosis  4.RT PCA  prox 70 % stenosis . 5. ICA prox  50 % stenosis . 6.? 50 % stenosis Lt VBJ

## 2017-08-09 NOTE — Discharge Instructions (Signed)
Femoral Site Care Refer to this sheet in the next few weeks. These instructions provide you with information about caring for yourself after your procedure. Your health care provider may also give you more specific instructions. Your treatment has been planned according to current medical practices, but problems sometimes occur. Call your health care provider if you have any problems or questions after your procedure. What can I expect after the procedure? After your procedure, it is typical to have the following:  Bruising at the site that usually fades within 1-2 weeks.  Blood collecting in the tissue (hematoma) that may be painful to the touch. It should usually decrease in size and tenderness within 1-2 weeks.  Follow these instructions at home:  Take medicines only as directed by your health care provider.  You may shower 24-48 hours after the procedure or as directed by your health care provider. Remove the bandage (dressing) and gently wash the site with plain soap and water. Pat the area dry with a clean towel. Do not rub the site, because this may cause bleeding.  Do not take baths, swim, or use a hot tub until your health care provider approves.  Check your insertion site every day for redness, swelling, or drainage.  Do not apply powder or lotion to the site.  Limit use of stairs to twice a day for the first 2-3 days or as directed by your health care provider.  Do not squat for the first 2-3 days or as directed by your health care provider.  Do not lift over 10 lb (4.5 kg) for 5 days after your procedure or as directed by your health care provider.  Ask your health care provider when it is okay to: ? Return to work or school. ? Resume usual physical activities or sports. ? Resume sexual activity.  Do not drive home if you are discharged the same day as the procedure. Have someone else drive you.  You may drive 24 hours after the procedure unless otherwise instructed by  your health care provider.  Do not operate machinery or power tools for 24 hours after the procedure or as directed by your health care provider.  If your procedure was done as an outpatient procedure, which means that you went home the same day as your procedure, a responsible adult should be with you for the first 24 hours after you arrive home.  Keep all follow-up visits as directed by your health care provider. This is important. Contact a health care provider if:  You have a fever.  You have chills.  You have increased bleeding from the site. Hold pressure on the site. Get help right away if:  You have unusual pain at the site.  You have redness, warmth, or swelling at the site.  You have drainage (other than a small amount of blood on the dressing) from the site.  The site is bleeding, and the bleeding does not stop after 30 minutes of holding steady pressure on the site.  Your leg or foot becomes pale, cool, tingly, or numb. This information is not intended to replace advice given to you by your health care provider. Make sure you discuss any questions you have with your health care provider. Document Released: 09/27/2013 Document Revised: 07/02/2015 Document Reviewed: 08/13/2013 Elsevier Interactive Patient Education  2018 [employer]. Cerebral Angiogram Cerebral angiogram, also called cerebral angiography, is an imaging test. It uses X-ray or digital images and colored dye (iodine solution) that is called contrast. An  angiogram is done to look at blood vessels in the brain and neck. It helps to detect any abnormalities in the vessels that might affect blood flow. During the procedure, the physician will inject medicine into an area in your groin or arm to numb it. The physician will make a tiny incision in the groin or arm. A thin tube (catheter) will be slipped into an artery and moved to the brain or neck. The contrast will then be injected into the catheter and images will  be taken of the area. Tell a health care provider about:  Any allergies you have, particularly shellfish.  All medicines you are taking, including vitamins, herbs, eye drops, creams, and over-the-counter medicines.  Any problems you or family members have had with anesthetic medicines.  Any reactions you have had to contrast dye or iodine.  Any blood disorders you have.  Any surgeries you have had.  Medical conditions you have, including pregnancy or the possibility of pregnancy.  If you are currently breastfeeding. What are the risks? Generally, this is a safe procedure. However, problems can occur and include:  Allergic reaction to contrast material or anesthesia.  Damage to surrounding nerves, tissues, or structures.  Bleeding or bruising.  Blood clot.  Infection.  Inability to remember what happened (amnesia) (usually temporary).  Weakness, numbness, speech, or vision problems (usually temporary).  Stroke.  Kidney injury.  What happens before the procedure?  You may have: ? A physical exam. ? Blood and urine tests. ? Imaging tests, including X-rays or MRIs.  Ask your health care provider about: ? Changing or stopping your regular medicines. This is especially important if you are taking diabetes medicines or blood thinners. ? Taking medicines such as aspirin or ibuprofen. These medicines can thin your blood. Do not take these medicines before your procedure if your health care provider asks you not to.  Do not eat or drink anything after midnight on the night before the procedure or as directed by your health care provider. What happens during the procedure?  You will lie on your back on an imaging bed with a C-shaped machine around you.  Your head will be secured to the bed with a strap or device to help you keep still.  You will be given medicine to help you relax (sedative) through an IV in your arm.  Your heart rate and other vital signals will be  watched carefully.  Cleaning and numbing medicine (local anesthetic) will be applied to your skin where the insertion will take place. This is usually your groin, leg, or arm.  The radiologist will make a small incision.  The catheter will be inserted into an artery that leads to the head. You may feel slight pressure.  The catheter will be moved through the body all the way up to your neck and brain. Images will help your health care provider bring the catheter to the correct location.  The dye will be injected into the catheter and will travel to your head or neck area. You may feel a warming or burning sensation or notice a strange taste as the dye goes through your system.  Images will be taken of how the dye flows through the area. It is normal to hear beeping noises and machines during the procedure.  While the images are being taken, you may be given instructions on breathing, swallowing, moving, or talking.  When the images are finished, the catheter will be slowly removed.  Pressure will be applied  to the skin to stop any bleeding. A tight bandage (dressing) or seal will be applied to the skin.  Your IV line will be removed. What happens after the procedure?  You will stay in a recovery area until the medicine has worn off. Your blood pressure and pulse will be observed.  You will be asked to lie flat and to keep your arm or leg straight in the recovery room.  The insertion site will be watched for bleeding and you will be checked often.  You may feel tired.  You may feel tenderness or notice bruising at the insertion site. This information is not intended to replace advice given to you by your health care provider. Make sure you discuss any questions you have with your health care provider. Document Released: 06/10/2013 Document Revised: 07/02/2015 Document Reviewed: 02/06/2013 Elsevier Interactive Patient Education  2017 [place]. Moderate Conscious Sedation, Adult,  Care After These instructions provide you with information about caring for yourself after your procedure. Your health care provider may also give you more specific instructions. Your treatment has been planned according to current medical practices, but problems sometimes occur. Call your health care provider if you have any problems or questions after your procedure. What can I expect after the procedure? After your procedure, it is common:  To feel sleepy for several hours.  To feel clumsy and have poor balance for several hours.  To have poor judgment for several hours.  To vomit if you eat too soon.  Follow these instructions at home: For at least 24 hours after the procedure:   Do not: ? Participate in activities where you could fall or become injured. ? Drive. ? Use heavy machinery. ? Drink alcohol. ? Take sleeping pills or medicines that cause drowsiness. ? Make important decisions or sign legal documents. ? Take care of children on your own.  Rest. Eating and drinking  Follow the diet recommended by your health care provider.  If you vomit: ? Drink water, juice, or soup when you can drink without vomiting. ? Make sure you have little or no nausea before eating solid foods. General instructions  Have a responsible adult stay with you until you are awake and alert.  Take over-the-counter and prescription medicines only as told by your health care provider.  If you smoke, do not smoke without supervision.  Keep all follow-up visits as told by your health care provider. This is important. Contact a health care provider if:  You keep feeling nauseous or you keep vomiting.  You feel light-headed.  You develop a rash.  You have a fever. Get help right away if:  You have trouble breathing. This information is not intended to replace advice given to you by your health care provider. Make sure you discuss any questions you have with your health care  provider. Document Released: 11/14/2012 Document Revised: 06/29/2015 Document Reviewed: 05/16/2015 Elsevier Interactive Patient Education  [employer].

## 2017-08-09 NOTE — Sedation Documentation (Signed)
BED Rest to start at 1015

## 2017-08-09 NOTE — H&P (Signed)
Chief Complaint: Patient was seen in consultation today for cerebral arteriogram at the request of A Ahern MD  Referring Physician(s): Dr Myrla Halsted Dr Lavina Hamman  Supervising Physician: Dr Luanne Bras  Patient Status: [facility] - Out-pt  History of Present Illness: [name] is a 72 y.o. male   Pt had noticed change in vision; diplopia 3-4 months ago Was seen by PCP MRI then was deemed wnl  New symptoms developed recently continued vision changes; proprioception  Always veering to right Seems to occur when weather is hot  Never falls; no speech issues Denies headache; denies dizziness Denies N/V  MRA 07/29/17:  IMPRESSION: 1. Intracranial atherosclerosis including severe bilateral PCA stenoses. 2. Mild distal right V4 stenosis. 3. Moderate right M1 and right A1 stenoses.  Now referred to Dr Estanislado Pandy for cerebral arteriogram    Past Medical History:  Diagnosis Date  . Anxiety   . BPH (benign prostatic hyperplasia)   . Erectile dysfunction   . GERD (gastroesophageal reflux disease)   . Hypercholesteremia   . Hypertension     Past Surgical History:  Procedure Laterality Date  . APPENDECTOMY    . COLONOSCOPY  10/2012  . ESOPHAGOGASTRODUODENOSCOPY ENDOSCOPY  10/2012  . HERNIA REPAIR    . PROSTATE SURGERY     20 years ago    Allergies: Bee venom and Penicillins  Medications: Prior to Admission medications   Medication Sig Start Date End Date Taking? Authorizing Provider  acetaminophen (TYLENOL) 500 MG tablet Take 500 mg by mouth every 6 (six) hours as needed.   Yes [provider]  aspirin EC 81 MG tablet Take 81 mg by mouth daily.   Yes [provider]  ezetimibe (ZETIA) 10 MG tablet Take 10 mg by mouth at bedtime.    Yes [provider]  irbesartan-hydrochlorothiazide (AVALIDE) 150-12.5 MG tablet Take 1 tablet by mouth daily.   Yes [provider]  omeprazole (PRILOSEC) 20 MG capsule Take 20 mg by mouth daily.     Yes [provider]  tamsulosin (FLOMAX) 0.4 MG CAPS capsule Take 0.4 mg by mouth daily after breakfast.   Yes [provider]     Family History  Problem Relation Age of Onset  . Aneurysm Father   . Breast cancer Sister   . Colon cancer Sister     Social History   Socioeconomic History  . Marital status: Married    Spouse name: Wyona Neils  . Number of children: 2  . Years of education: 58  . Highest education level: Not on file  Occupational History  . Occupation: Oceanographer.  Social Needs  . Financial resource strain: Not on file  . Food insecurity:    Worry: Not on file    Inability: Not on file  . Transportation needs:    Medical: Not on file    Non-medical: Not on file  Tobacco Use  . Smoking status: Former Smoker    Last attempt to quit: 03/10/1996    Years since quitting: 21.4  . Smokeless tobacco: Never Used  Substance and Sexual Activity  . Alcohol use: No    Alcohol/week: 0.0 oz  . Drug use: No  . Sexual activity: Not on file  Lifestyle  . Physical activity:    Days per week: Not on file    Minutes per session: Not on file  . Stress: Not on file  Relationships  . Social connections:    Talks on phone: Not on file  Gets together: Not on file    Attends religious service: Not on file    Active member of club or organization: Not on file    Attends meetings of clubs or organizations: Not on file    Relationship status: Not on file  Other Topics Concern  . Not on file  Social History Narrative  . Not on file    Review of Systems: A 12 point ROS discussed and pertinent positives are indicated in the HPI above.  All other systems are negative.  Review of Systems  Constitutional: Negative for activity change, appetite change, fatigue and fever.  HENT: Negative for tinnitus and voice change.   Eyes: Positive for visual disturbance.  Respiratory: Negative for cough and shortness of breath.   Cardiovascular: Negative for chest  pain.  Gastrointestinal: Negative for abdominal pain and nausea.  Musculoskeletal: Positive for gait problem.       Proprioception issues occasionally  Neurological: Negative for dizziness, tremors, seizures, syncope, facial asymmetry, speech difficulty, weakness, light-headedness, numbness and headaches.  Psychiatric/Behavioral: Negative for behavioral problems and confusion.    Vital Signs: BP 128/87   Pulse 69   Temp 98.1 F (36.7 C) (Oral)   Resp 16   Ht 5\' 9"  (1.753 m)   Wt 170 lb (77.1 kg)   SpO2 100%   BMI 25.10 kg/m   Physical Exam  Constitutional: He is oriented to person, place, and time.  Cardiovascular: Normal rate, regular rhythm and normal heart sounds.  Pulmonary/Chest: Effort normal and breath sounds normal.  Abdominal: Soft. Bowel sounds are normal.  Musculoskeletal: Normal range of motion.  Neurological: He is alert and oriented to person, place, and time.  Skin: Skin is warm and dry.  Psychiatric: He has a normal mood and affect. His behavior is normal. Judgment and thought content normal.  Nursing note and vitals reviewed.   Imaging: [name] CX Contrast  Result Date: 07/30/2017 CLINICAL DATA:  Abnormal brain MRI. Imbalance. Ataxia. Diplopia. Concern for posterior circulation vascular disease. EXAM: MRA HEAD WITHOUT CONTRAST TECHNIQUE: Angiographic images of the Circle of Willis were obtained using MRA technique without intravenous contrast. COMPARISON:  04/29/2017 brain MRI.  No prior angiographic imaging. FINDINGS: The visualized distal vertebral arteries are patent to the basilar with irregularity and mild narrowing of the distal right V4 segment. AICAs are small and not well evaluated. Patent SCA origins are visualized bilaterally. The basilar artery is widely patent. Posterior communicating arteries are not identified. There are severe tandem stenoses involving the right P1 and proximal P2 segments. There is also a severe mid left P2 stenosis, and there  is asymmetric attenuation of the more distal left P2 segment. The internal carotid arteries are patent from skull base to carotid termini without evidence of significant stenosis. ACAs and MCAs are patent without evidence of proximal branch occlusion. There is moderate multifocal narrowing of the right M1 segment. There is also likely a moderate proximal right A1 stenosis. No significant left A1 or left M1 stenosis is identified. No intracranial aneurysm is identified. IMPRESSION: 1. Intracranial atherosclerosis including severe bilateral PCA stenoses. 2. Mild distal right V4 stenosis. 3. Moderate right M1 and right A1 stenoses. Electronically Signed   By: Logan Bores M.D.   On: 07/30/2017 16:36    Labs:  CBC: Recent Labs    08/09/17 0730  WBC 5.9  HGB 14.7  HCT 44.1  PLT 182    COAGS: Recent Labs    08/09/17 0730  INR 1.03  BMP: Recent Labs    04/19/17 0829 08/09/17 0730  NA 141 141  K 4.1 3.8  CL 102 106  CO2 24 28  GLUCOSE 90 94  BUN 19 17  CALCIUM 10.4* 9.7  CREATININE 1.46* 1.52*  GFRNONAA 47* 44*  GFRAA 55* 51*    LIVER FUNCTION TESTS: No results for input(s): BILITOT, AST, ALT, ALKPHOS, PROT, ALBUMIN in the last 8760 hours.  TUMOR MARKERS: No results for input(s): AFPTM, CEA, CA199, CHROMGRNA in the last 8760 hours.  Assessment and Plan:  Symptoms of diplopia and proprioception issues--- veers to Rt Abnormal MRA; Bilat Posterior communicating artery stenosis Now scheduled for cerebral arteriogram Risks and benefits of cerebral angiogram with intervention were discussed with the patient including, but not limited to bleeding, infection, vascular injury, contrast induced renal failure, stroke or even death.  This interventional procedure involves the use of X-rays and because of the nature of the planned procedure, it is possible that we will have prolonged use of X-ray fluoroscopy.  Potential radiation risks to you include (but are not limited to) the  following: - A slightly elevated risk for cancer  several years later in life. This risk is typically less than 0.5% percent. This risk is low in comparison to the normal incidence of human cancer, which is 33% for women and 50% for men according to the [place]. - Radiation induced injury can include skin redness, resembling a rash, tissue breakdown / ulcers and hair loss (which can be temporary or permanent).   The likelihood of either of these occurring depends on the difficulty of the procedure and whether you are sensitive to radiation due to previous procedures, disease, or genetic conditions.   IF your procedure requires a prolonged use of radiation, you will be notified and given written instructions for further action.  It is your responsibility to monitor the irradiated area for the 2 weeks following the procedure and to notify your physician if you are concerned that you have suffered a radiation induced injury.    All of the patient's questions were answered, patient is agreeable to proceed.  Consent signed and in chart.  Thank you for this interesting consult.  I greatly enjoyed meeting HUNT ZAJICEK and look forward to participating in their care.  A copy of this report was sent to the requesting provider on this date.  Electronically Signed: Carmine Youngberg A, PA-C 08/09/2017, 8:30 AM   I spent a total of  30 Minutes   in face to face in clinical consultation, greater than 50% of which was counseling/coordinating care for cerebral arteriogram

## 2017-08-11 ENCOUNTER — Encounter (HOSPITAL_COMMUNITY): Payer: Self-pay | Admitting: Interventional Radiology

## 2017-08-15 ENCOUNTER — Other Ambulatory Visit (HOSPITAL_COMMUNITY): Payer: Self-pay | Admitting: Interventional Radiology

## 2017-08-15 DIAGNOSIS — I771 Stricture of artery: Secondary | ICD-10-CM

## 2017-08-18 ENCOUNTER — Telehealth: Payer: Self-pay | Admitting: Student

## 2017-08-18 ENCOUNTER — Other Ambulatory Visit (HOSPITAL_COMMUNITY): Payer: Self-pay | Admitting: Radiology

## 2017-08-18 DIAGNOSIS — K219 Gastro-esophageal reflux disease without esophagitis: Secondary | ICD-10-CM | POA: Diagnosis present

## 2017-08-18 DIAGNOSIS — R9089 Other abnormal findings on diagnostic imaging of central nervous system: Secondary | ICD-10-CM

## 2017-08-18 DIAGNOSIS — Z9841 Cataract extraction status, right eye: Secondary | ICD-10-CM | POA: Diagnosis not present

## 2017-08-18 DIAGNOSIS — N400 Enlarged prostate without lower urinary tract symptoms: Secondary | ICD-10-CM | POA: Diagnosis present

## 2017-08-18 DIAGNOSIS — Z87891 Personal history of nicotine dependence: Secondary | ICD-10-CM | POA: Diagnosis not present

## 2017-08-18 DIAGNOSIS — I129 Hypertensive chronic kidney disease with stage 1 through stage 4 chronic kidney disease, or unspecified chronic kidney disease: Secondary | ICD-10-CM | POA: Diagnosis present

## 2017-08-18 DIAGNOSIS — E7800 Pure hypercholesterolemia, unspecified: Secondary | ICD-10-CM | POA: Diagnosis present

## 2017-08-18 DIAGNOSIS — I6601 Occlusion and stenosis of right middle cerebral artery: Secondary | ICD-10-CM | POA: Diagnosis present

## 2017-08-18 DIAGNOSIS — Z79899 Other long term (current) drug therapy: Secondary | ICD-10-CM | POA: Diagnosis not present

## 2017-08-18 DIAGNOSIS — I6521 Occlusion and stenosis of right carotid artery: Secondary | ICD-10-CM | POA: Diagnosis present

## 2017-08-18 DIAGNOSIS — Z803 Family history of malignant neoplasm of breast: Secondary | ICD-10-CM | POA: Diagnosis not present

## 2017-08-18 DIAGNOSIS — Z9842 Cataract extraction status, left eye: Secondary | ICD-10-CM | POA: Diagnosis not present

## 2017-08-18 DIAGNOSIS — Z91030 Bee allergy status: Secondary | ICD-10-CM | POA: Diagnosis not present

## 2017-08-18 DIAGNOSIS — Z961 Presence of intraocular lens: Secondary | ICD-10-CM | POA: Diagnosis present

## 2017-08-18 DIAGNOSIS — Z880 Allergy status to penicillin: Secondary | ICD-10-CM | POA: Diagnosis not present

## 2017-08-18 DIAGNOSIS — Z7902 Long term (current) use of antithrombotics/antiplatelets: Secondary | ICD-10-CM | POA: Diagnosis not present

## 2017-08-18 DIAGNOSIS — Z7982 Long term (current) use of aspirin: Secondary | ICD-10-CM | POA: Diagnosis not present

## 2017-08-18 DIAGNOSIS — Z7901 Long term (current) use of anticoagulants: Secondary | ICD-10-CM | POA: Diagnosis not present

## 2017-08-18 DIAGNOSIS — Z800 Family history of malignant neoplasm of digestive organs: Secondary | ICD-10-CM | POA: Diagnosis not present

## 2017-08-18 DIAGNOSIS — N182 Chronic kidney disease, stage 2 (mild): Secondary | ICD-10-CM | POA: Diagnosis present

## 2017-08-18 LAB — PLATELET INHIBITION P2Y12: Platelet Function  P2Y12: 136 [PRU] — ABNORMAL LOW (ref 194–418)

## 2017-08-18 NOTE — Progress Notes (Signed)
Pt came in for P2Y12 check today (08/18/17). Result was 136. Pt currently taking Aspirin 81mg  and Plavix 75mg  both 1 QD. Per Deveshwar pt is to DC Plavix and start Brilinta 90mg  b.i.d. And continue on the Aspirin 81mg  QD. Radonna Ricker, PA will call pt and pharmacy with this information. JM

## 2017-08-18 NOTE — Telephone Encounter (Signed)
Called patient regarding P2Y12 level from today- 136 PRU. Patient is scheduled for cerebral angiogram with possible right ICA revascularization. Patient is currently taking Plavix 75 mg once daily and Aspirin 81 mg once daily.  Spoke with our scheduler, Anderson Malta, who states she spoke with Dr. Estanislado Pandy regarding medication changes.  Informed patient of P2Y12 level from today. Informed patient that based on this lab value, per Dr. Estanislado Pandy, we will be switching up his medications. Instructed patient to discontinue Plavix use. Instructed patient to begin taking Brilinta 90 mg twice daily. Instructed patient to continue taking Aspirin 81 mg once daily. Confirmed pharmacy with patient- [facility] [place].  Called prescription into [facility] [phone])- Brilinta 90 mg, take one tablet by mouth twice daily, dispense 60 tablets with 3 refills.  All questions answered and concerns addressed. Patient conveys understanding and agrees with plan.  Bea Graff Louk, PA-C 08/18/2017, 12:28 PM

## 2017-08-22 ENCOUNTER — Other Ambulatory Visit: Payer: Self-pay

## 2017-08-22 ENCOUNTER — Telehealth: Payer: Self-pay | Admitting: Student

## 2017-08-22 ENCOUNTER — Encounter (HOSPITAL_COMMUNITY): Payer: Self-pay | Admitting: *Deleted

## 2017-08-22 ENCOUNTER — Other Ambulatory Visit: Payer: Self-pay | Admitting: Radiology

## 2017-08-22 NOTE — Progress Notes (Signed)
Pt denies chest pain and being under the care of a cardiologist but stated " I get short of breath at times since an hour after starting Brilinta, it says that it can be a side effect of the medicine, it says to tell the doctor if it gets worse and I hardly notice it. " Left voice message with Anderson Malta, RN to make MD aware. Pt denies having an echo and cardiac cath but stated that a stress test was performed > 10 years ago. Pt denies having an EKG and chest x ray within the last year. Pt made aware to stop taking vitamins, fish oil and herbal medicationsDo not take any NSAIDs ie: Ibuprofen, Advil, Naproxen(Aleve), Motrin, BC and Goody Powder. Pt verbalized understanding of all pre-op instructions.

## 2017-08-22 NOTE — Telephone Encounter (Signed)
Received message from pre-admit RN regarding patient's shortness of breath. RN reports patient stating he has had shortness of breath since beginning Brilinta.  Patient is scheduled for cerebral angiogram with right ICA angioplasty and possible stent placement 08/23/2017. Patient is currently taking Brilinta 90 mg twice daily and Aspirin 81 mg once daily.  Patient states that he notices the "tinest shortness of breath" for a few minutes after he takes Brilinta. States that it occurs for a few minutes and he "barley notices it". Denies chest pain or palpitations associated with shortness of breath.  Discussed case with Dr. Estanislado Pandy.  Instructed patient, per Dr. Estanislado Pandy, to drink 1/2 of a cup of coffee 20 minutes before taking Brilinta to see if that changes his shortness of breath. However, instructed patient not to begin this until after his scheduled procedure tomorrow.  Instructed patient to continue taking Brilinta 90 mg twice daily and Aspirin 81 mg once daily.  All questions answered and concerns addressed. Patient conveys understanding and agrees with plan.  Bea Graff Rosario Duey, PA-C 08/22/2017, 3:57 PM

## 2017-08-23 ENCOUNTER — Ambulatory Visit (HOSPITAL_COMMUNITY): Payer: PPO | Admitting: Anesthesiology

## 2017-08-23 ENCOUNTER — Other Ambulatory Visit (HOSPITAL_COMMUNITY): Payer: PPO

## 2017-08-23 ENCOUNTER — Inpatient Hospital Stay (HOSPITAL_COMMUNITY)
Admission: AD | Admit: 2017-08-23 | Discharge: 2017-08-24 | DRG: 036 | Disposition: A | Discharge status: Home / Self Care | Payer: PPO | Attending: Interventional Radiology | Admitting: Interventional Radiology

## 2017-08-23 ENCOUNTER — Encounter (HOSPITAL_COMMUNITY): Payer: Self-pay

## 2017-08-23 ENCOUNTER — Other Ambulatory Visit: Payer: Self-pay

## 2017-08-23 ENCOUNTER — Encounter (HOSPITAL_COMMUNITY)
Admission: AD | Disposition: A | Discharge status: Home / Self Care | Payer: Self-pay | Source: Home / Self Care | Attending: Interventional Radiology

## 2017-08-23 ENCOUNTER — Inpatient Hospital Stay (HOSPITAL_COMMUNITY): Admission: RE | Admit: 2017-08-23 | Payer: PPO | Source: Ambulatory Visit

## 2017-08-23 ENCOUNTER — Ambulatory Visit (HOSPITAL_COMMUNITY)
Admission: RE | Admit: 2017-08-23 | Discharge: 2017-08-23 | Disposition: A | Discharge status: Home / Self Care | Payer: PPO | Source: Ambulatory Visit | Attending: Interventional Radiology | Admitting: Interventional Radiology

## 2017-08-23 ENCOUNTER — Ambulatory Visit (HOSPITAL_COMMUNITY): Payer: PPO

## 2017-08-23 DIAGNOSIS — K219 Gastro-esophageal reflux disease without esophagitis: Secondary | ICD-10-CM | POA: Diagnosis present

## 2017-08-23 DIAGNOSIS — I129 Hypertensive chronic kidney disease with stage 1 through stage 4 chronic kidney disease, or unspecified chronic kidney disease: Secondary | ICD-10-CM | POA: Diagnosis present

## 2017-08-23 DIAGNOSIS — Z7902 Long term (current) use of antithrombotics/antiplatelets: Secondary | ICD-10-CM | POA: Diagnosis not present

## 2017-08-23 DIAGNOSIS — Z7982 Long term (current) use of aspirin: Secondary | ICD-10-CM

## 2017-08-23 DIAGNOSIS — Z803 Family history of malignant neoplasm of breast: Secondary | ICD-10-CM | POA: Diagnosis not present

## 2017-08-23 DIAGNOSIS — Z800 Family history of malignant neoplasm of digestive organs: Secondary | ICD-10-CM

## 2017-08-23 DIAGNOSIS — Z880 Allergy status to penicillin: Secondary | ICD-10-CM

## 2017-08-23 DIAGNOSIS — N400 Enlarged prostate without lower urinary tract symptoms: Secondary | ICD-10-CM | POA: Diagnosis present

## 2017-08-23 DIAGNOSIS — Z79899 Other long term (current) drug therapy: Secondary | ICD-10-CM | POA: Diagnosis not present

## 2017-08-23 DIAGNOSIS — N182 Chronic kidney disease, stage 2 (mild): Secondary | ICD-10-CM | POA: Diagnosis present

## 2017-08-23 DIAGNOSIS — Z9841 Cataract extraction status, right eye: Secondary | ICD-10-CM | POA: Diagnosis not present

## 2017-08-23 DIAGNOSIS — E7800 Pure hypercholesterolemia, unspecified: Secondary | ICD-10-CM | POA: Diagnosis present

## 2017-08-23 DIAGNOSIS — Z9842 Cataract extraction status, left eye: Secondary | ICD-10-CM | POA: Diagnosis not present

## 2017-08-23 DIAGNOSIS — I6521 Occlusion and stenosis of right carotid artery: Secondary | ICD-10-CM | POA: Diagnosis present

## 2017-08-23 DIAGNOSIS — Z7901 Long term (current) use of anticoagulants: Secondary | ICD-10-CM | POA: Diagnosis not present

## 2017-08-23 DIAGNOSIS — Z91030 Bee allergy status: Secondary | ICD-10-CM

## 2017-08-23 DIAGNOSIS — Z87891 Personal history of nicotine dependence: Secondary | ICD-10-CM | POA: Diagnosis not present

## 2017-08-23 DIAGNOSIS — I771 Stricture of artery: Secondary | ICD-10-CM

## 2017-08-23 DIAGNOSIS — I6601 Occlusion and stenosis of right middle cerebral artery: Secondary | ICD-10-CM | POA: Diagnosis present

## 2017-08-23 DIAGNOSIS — Z961 Presence of intraocular lens: Secondary | ICD-10-CM | POA: Diagnosis present

## 2017-08-23 HISTORY — PX: IR INTRAVSC STENT CERV CAROTID W/EMB-PROT MOD SED INCL ANGIO: IMG2303

## 2017-08-23 HISTORY — DX: Chronic kidney disease, unspecified: N18.9

## 2017-08-23 HISTORY — PX: RADIOLOGY WITH ANESTHESIA: SHX6223

## 2017-08-23 LAB — CBC WITH DIFFERENTIAL/PLATELET
Abs Immature Granulocytes: 0 10*3/uL (ref 0.0–0.1)
BASOS PCT: 1 %
Basophils Absolute: 0 10*3/uL (ref 0.0–0.1)
EOS PCT: 3 %
Eosinophils Absolute: 0.2 10*3/uL (ref 0.0–0.7)
HCT: 41.3 % (ref 39.0–52.0)
Hemoglobin: 13.5 g/dL (ref 13.0–17.0)
Immature Granulocytes: 0 %
Lymphocytes Relative: 27 %
Lymphs Abs: 1.2 10*3/uL (ref 0.7–4.0)
MCH: 30.1 pg (ref 26.0–34.0)
MCHC: 32.7 g/dL (ref 30.0–36.0)
MCV: 92 fL (ref 78.0–100.0)
MONO ABS: 0.5 10*3/uL (ref 0.1–1.0)
Monocytes Relative: 11 %
Neutro Abs: 2.6 10*3/uL (ref 1.7–7.7)
Neutrophils Relative %: 58 %
PLATELETS: 183 10*3/uL (ref 150–400)
RBC: 4.49 MIL/uL (ref 4.22–5.81)
RDW: 12.6 % (ref 11.5–15.5)
WBC: 4.5 10*3/uL (ref 4.0–10.5)

## 2017-08-23 LAB — POCT ACTIVATED CLOTTING TIME
ACTIVATED CLOTTING TIME: 197 s
Activated Clotting Time: 180 seconds
Activated Clotting Time: 202 seconds

## 2017-08-23 LAB — BASIC METABOLIC PANEL
Anion gap: 8 (ref 5–15)
BUN: 20 mg/dL (ref 8–23)
CALCIUM: 9.5 mg/dL (ref 8.9–10.3)
CO2: 23 mmol/L (ref 22–32)
Chloride: 110 mmol/L (ref 98–111)
Creatinine, Ser: 1.47 mg/dL — ABNORMAL HIGH (ref 0.61–1.24)
GFR calc Af Amer: 53 mL/min — ABNORMAL LOW (ref >60)
GFR, EST NON AFRICAN AMERICAN: 46 mL/min — AB (ref >60)
GLUCOSE: 96 mg/dL (ref 70–99)
Potassium: 3.6 mmol/L (ref 3.5–5.1)
Sodium: 141 mmol/L (ref 135–145)

## 2017-08-23 LAB — PLATELET INHIBITION P2Y12: PLATELET FUNCTION P2Y12: 51 [PRU] — AB (ref 194–418)

## 2017-08-23 LAB — PROTIME-INR
INR: 1.07
Prothrombin Time: 13.8 seconds (ref 11.4–15.2)

## 2017-08-23 SURGERY — IR WITH ANESTHESIA
Anesthesia: General

## 2017-08-23 MED ORDER — CLEVIDIPINE BUTYRATE 0.5 MG/ML IV EMUL
INTRAVENOUS | Status: DC | PRN
  Administered 2017-08-23: 1 mg/h via INTRAVENOUS

## 2017-08-23 MED ORDER — VANCOMYCIN HCL IN DEXTROSE 1-5 GM/200ML-% IV SOLN
INTRAVENOUS | Status: AC
  Filled 2017-08-23: qty 200

## 2017-08-23 MED ORDER — EPHEDRINE SULFATE 50 MG/ML IJ SOLN
INTRAMUSCULAR | Status: DC | PRN
  Administered 2017-08-23: 15 mg via INTRAVENOUS
  Administered 2017-08-23: 10 mg via INTRAVENOUS

## 2017-08-23 MED ORDER — HEPARIN (PORCINE) IN NACL 100-0.45 UNIT/ML-% IJ SOLN
INTRAMUSCULAR | Status: AC
  Administered 2017-08-23: 500 [IU]/h via INTRAVENOUS
  Filled 2017-08-23: qty 250

## 2017-08-23 MED ORDER — EPTIFIBATIDE 20 MG/10ML IV SOLN
INTRAVENOUS | Status: AC
  Administered 2017-08-23 (×2): 1.5 mg
  Filled 2017-08-23: qty 10

## 2017-08-23 MED ORDER — FENTANYL CITRATE (PF) 100 MCG/2ML IJ SOLN: 25.0000 ug | INTRAMUSCULAR | Status: DC | PRN

## 2017-08-23 MED ORDER — ASPIRIN 81 MG PO CHEW: 81.0000 mg | CHEWABLE_TABLET | Freq: Every day | ORAL | Status: DC

## 2017-08-23 MED ORDER — CLEVIDIPINE BUTYRATE 0.5 MG/ML IV EMUL
0.0000 mg/h | INTRAVENOUS | Status: DC
  Administered 2017-08-23: 2 mg/h via INTRAVENOUS
  Administered 2017-08-23: 8 mg/h via INTRAVENOUS
  Administered 2017-08-23 (×3): 4 mg/h via INTRAVENOUS
  Administered 2017-08-23 (×2): 1 mg/h via INTRAVENOUS
  Administered 2017-08-24: 8 mg/h via INTRAVENOUS
  Administered 2017-08-24: 4 mg/h via INTRAVENOUS
  Filled 2017-08-23 (×3): qty 50

## 2017-08-23 MED ORDER — SODIUM CHLORIDE 0.9 % IV SOLN
INTRAVENOUS | Status: DC
  Administered 2017-08-23 – 2017-08-24 (×2): via INTRAVENOUS

## 2017-08-23 MED ORDER — SODIUM CHLORIDE 0.9 % IV SOLN
INTRAVENOUS | Status: DC
  Administered 2017-08-23 (×2): via INTRAVENOUS

## 2017-08-23 MED ORDER — IOHEXOL 300 MG/ML  SOLN: 120.0000 mL | Freq: Once | INTRAMUSCULAR | Status: DC | PRN

## 2017-08-23 MED ORDER — HEPARIN (PORCINE) IN NACL 100-0.45 UNIT/ML-% IJ SOLN
500.0000 [IU]/h | INTRAMUSCULAR | Status: DC
  Administered 2017-08-23: 500 [IU]/h via INTRAVENOUS

## 2017-08-23 MED ORDER — ASPIRIN 81 MG PO CHEW
81.0000 mg | CHEWABLE_TABLET | Freq: Every day | ORAL | Status: DC
  Administered 2017-08-24: 81 mg via ORAL
  Filled 2017-08-23: qty 1

## 2017-08-23 MED ORDER — TICAGRELOR 90 MG PO TABS: 90.0000 mg | ORAL_TABLET | Freq: Two times a day (BID) | ORAL | Status: DC

## 2017-08-23 MED ORDER — GLYCOPYRROLATE 0.2 MG/ML IJ SOLN
INTRAMUSCULAR | Status: DC | PRN
  Administered 2017-08-23: 0.3 mg via INTRAVENOUS

## 2017-08-23 MED ORDER — HEPARIN (PORCINE) IN NACL 100-0.45 UNIT/ML-% IJ SOLN
900.0000 [IU]/h | INTRAMUSCULAR | Status: DC
  Filled 2017-08-23: qty 250

## 2017-08-23 MED ORDER — DEXAMETHASONE SODIUM PHOSPHATE 10 MG/ML IJ SOLN
INTRAMUSCULAR | Status: DC | PRN
  Administered 2017-08-23: 5 mg via INTRAVENOUS

## 2017-08-23 MED ORDER — HEPARIN (PORCINE) IN NACL 100-0.45 UNIT/ML-% IJ SOLN
600.0000 [IU]/h | INTRAMUSCULAR | Status: DC
  Administered 2017-08-23: 600 [IU]/h via INTRAVENOUS
  Filled 2017-08-23: qty 250

## 2017-08-23 MED ORDER — ACETAMINOPHEN 650 MG RE SUPP: 650.0000 mg | RECTAL | Status: DC | PRN

## 2017-08-23 MED ORDER — FENTANYL CITRATE (PF) 100 MCG/2ML IJ SOLN
INTRAMUSCULAR | Status: DC | PRN
  Administered 2017-08-23: 25 ug via INTRAVENOUS
  Administered 2017-08-23: 50 ug via INTRAVENOUS
  Administered 2017-08-23: 25 ug via INTRAVENOUS

## 2017-08-23 MED ORDER — ACETAMINOPHEN 325 MG PO TABS
650.0000 mg | ORAL_TABLET | ORAL | Status: DC | PRN
  Administered 2017-08-23 – 2017-08-24 (×2): 650 mg via ORAL
  Filled 2017-08-23 (×2): qty 2

## 2017-08-23 MED ORDER — DIPHENHYDRAMINE HCL 50 MG/ML IJ SOLN
INTRAMUSCULAR | Status: AC
  Filled 2017-08-23: qty 1

## 2017-08-23 MED ORDER — TICAGRELOR 90 MG PO TABS
90.0000 mg | ORAL_TABLET | Freq: Two times a day (BID) | ORAL | Status: DC
  Administered 2017-08-23 – 2017-08-24 (×2): 90 mg via ORAL
  Filled 2017-08-23 (×2): qty 1

## 2017-08-23 MED ORDER — HEPARIN SODIUM (PORCINE) 1000 UNIT/ML IJ SOLN
INTRAMUSCULAR | Status: DC | PRN
  Administered 2017-08-23: 3000 [IU] via INTRAVENOUS
  Administered 2017-08-23: 500 [IU] via INTRAVENOUS

## 2017-08-23 MED ORDER — VANCOMYCIN HCL 1000 MG IV SOLR
1000.0000 mg | INTRAVENOUS | Status: AC
  Administered 2017-08-23: 1000 mg via INTRAVENOUS
  Filled 2017-08-23: qty 1000

## 2017-08-23 MED ORDER — LIDOCAINE HCL (PF) 1 % IJ SOLN
INTRAMUSCULAR | Status: DC | PRN
  Administered 2017-08-23: 8 mL

## 2017-08-23 MED ORDER — DIPHENHYDRAMINE HCL 50 MG/ML IJ SOLN
INTRAMUSCULAR | Status: DC | PRN
  Administered 2017-08-23: 12.5 mg via INTRAVENOUS

## 2017-08-23 MED ORDER — MEPERIDINE HCL 50 MG/ML IJ SOLN: 6.2500 mg | INTRAMUSCULAR | Status: DC | PRN

## 2017-08-23 MED ORDER — ASPIRIN EC 325 MG PO TBEC
325.0000 mg | DELAYED_RELEASE_TABLET | ORAL | Status: DC
  Filled 2017-08-23: qty 1

## 2017-08-23 MED ORDER — METOCLOPRAMIDE HCL 5 MG/ML IJ SOLN: 10.0000 mg | Freq: Once | INTRAMUSCULAR | Status: DC | PRN

## 2017-08-23 MED ORDER — ACETAMINOPHEN 160 MG/5ML PO SOLN: 650.0000 mg | ORAL | Status: DC | PRN

## 2017-08-23 MED ORDER — MIDAZOLAM HCL 5 MG/5ML IJ SOLN
INTRAMUSCULAR | Status: DC | PRN
  Administered 2017-08-23: 0.5 mg via INTRAVENOUS
  Administered 2017-08-23: 1 mg via INTRAVENOUS
  Administered 2017-08-23: 0.5 mg via INTRAVENOUS

## 2017-08-23 MED ORDER — PHENYLEPHRINE HCL 10 MG/ML IJ SOLN
INTRAMUSCULAR | Status: DC | PRN
  Administered 2017-08-23: 25 ug/min via INTRAVENOUS

## 2017-08-23 MED ORDER — LIDOCAINE HCL (CARDIAC) PF 100 MG/5ML IV SOSY
PREFILLED_SYRINGE | INTRAVENOUS | Status: DC | PRN
  Administered 2017-08-23: 40 mg via INTRATRACHEAL

## 2017-08-23 MED ORDER — NIMODIPINE 30 MG PO CAPS
0.0000 mg | ORAL_CAPSULE | ORAL | Status: DC
  Filled 2017-08-23: qty 2

## 2017-08-23 MED ORDER — NITROGLYCERIN 1 MG/10 ML FOR IR/CATH LAB
INTRA_ARTERIAL | Status: AC
  Filled 2017-08-23: qty 10

## 2017-08-23 MED ORDER — LIDOCAINE HCL 1 % IJ SOLN
INTRAMUSCULAR | Status: AC
  Filled 2017-08-23: qty 20

## 2017-08-23 MED ORDER — CLEVIDIPINE BUTYRATE 0.5 MG/ML IV EMUL
INTRAVENOUS | Status: AC
  Filled 2017-08-23: qty 50

## 2017-08-23 NOTE — H&P (Signed)
Chief Complaint: Patient was seen in consultation today for right ICA stenosis.  Referring Physician(s): Sarina Ill  Supervising Physician: Luanne Bras  Patient Status: [facility] - Out-pt  History of Present Illness: John Salazar is a 72 y.o. male with a past medical history of hypertension, hypercholesteremia, GERD, CKD, BPJ, ED, and anxiety. He was referred to Dr. Estanislado Pandy by Dr. Jaynee Eagles for management of dizziness. He underwent a diagnostic cerebral angiogram with Dr. Estanislado Pandy 08/09/2017.  Diagnostic cerebral angiogram 08/09/2017: 1. Approximately 80-85% stenosis of proximal right ICA. 2. Right MCA  approximately 70% stenosis proximal. 3. Right VA approximately 70% stenosis. 4. Right PCA  proximal 70% stenosis. 5. ICA proximal 50% stenosis. 6. ?50% stenosis left VBJ.  Patient presents today for possible image-guided cerebral angiogram with possible right ICA stenosis angioplasty/stent placement. Patient awake and alert laying in bed with no complaints at this time. Accompanied by wife at bedside. Denies fever, chills, headache, weakness, dizziness, numbness/tingling, vision changes, hearing changes, tinnitus, or speech difficulty.  Patient is currently taking Brilinta 90 mg twice daily and Aspirin 81 mg once daily.   Past Medical History:  Diagnosis Date  . Anxiety   . BPH (benign prostatic hyperplasia)   . Chronic kidney disease    stage 2  . Erectile dysfunction   . GERD (gastroesophageal reflux disease)   . Hypercholesteremia   . Hypertension     Past Surgical History:  Procedure Laterality Date  . APPENDECTOMY    . CATARACT EXTRACTION W/ INTRAOCULAR LENS  IMPLANT, BILATERAL    . COLONOSCOPY  10/2012  . ESOPHAGOGASTRODUODENOSCOPY ENDOSCOPY  10/2012  . HERNIA REPAIR    . IR ANGIO INTRA EXTRACRAN SEL COM CAROTID INNOMINATE BILAT MOD SED  08/09/2017  . IR ANGIO VERTEBRAL SEL VERTEBRAL BILAT MOD SED  08/09/2017  . PROSTATE SURGERY     20 years ago     Allergies: Bee venom and Penicillins  Medications: Prior to Admission medications   Medication Sig Start Date End Date Taking? Authorizing Provider  acetaminophen (TYLENOL) 500 MG tablet Take 1,000 mg by mouth every 6 (six) hours as needed for headache.    Yes [provider]  aspirin EC 81 MG tablet Take 81 mg by mouth daily.   Yes [provider]  ezetimibe (ZETIA) 10 MG tablet Take 10 mg by mouth at bedtime.    Yes [provider]  irbesartan-hydrochlorothiazide (AVALIDE) 150-12.5 MG tablet Take 1 tablet by mouth every morning.    Yes [provider]  omeprazole (PRILOSEC) 40 MG capsule Take 40 mg by mouth every morning.   Yes [provider]  tamsulosin (FLOMAX) 0.4 MG CAPS capsule Take 0.4 mg by mouth every evening.    Yes [provider]  ticagrelor (BRILINTA) 90 MG TABS tablet Take 90 mg by mouth 2 (two) times daily.   Yes [provider]  omeprazole (PRILOSEC) 20 MG capsule Take 20 mg by mouth daily.     [provider]     Family History  Problem Relation Age of Onset  . Aneurysm Father   . Breast cancer Sister   . Colon cancer Sister     Social History   Socioeconomic History  . Marital status: Married    Spouse name: pamela  . Number of children: 2  . Years of education: 67  . Highest education level: Not on file  Occupational History  . Occupation: Oceanographer.  Social Needs  . Financial resource strain: Not on file  .  Food insecurity:    Worry: Not on file    Inability: Not on file  . Transportation needs:    Medical: Not on file    Non-medical: Not on file  Tobacco Use  . Smoking status: Former Smoker    Types: Cigarettes    Last attempt to quit: 03/10/1996    Years since quitting: 21.4  . Smokeless tobacco: Never Used  Substance and Sexual Activity  . Alcohol use: No    Alcohol/week: 0.0 oz  . Drug use: No  . Sexual activity: Not on file  Lifestyle  . Physical  activity:    Days per week: Not on file    Minutes per session: Not on file  . Stress: Not on file  Relationships  . Social connections:    Talks on phone: Not on file    Gets together: Not on file    Attends religious service: Not on file    Active member of club or organization: Not on file    Attends meetings of clubs or organizations: Not on file    Relationship status: Not on file  Other Topics Concern  . Not on file  Social History Narrative  . Not on file     Review of Systems: A 12 point ROS discussed and pertinent positives are indicated in the HPI above.  All other systems are negative.  Review of Systems  Constitutional: Negative for chills and fever.  HENT: Negative for hearing loss and tinnitus.   Respiratory: Negative for shortness of breath and wheezing.   Cardiovascular: Negative for chest pain and palpitations.  Neurological: Negative for dizziness, speech difficulty, weakness, numbness and headaches.  Psychiatric/Behavioral: Negative for behavioral problems and confusion.    Vital Signs: BP (!) 154/91   Pulse 71   Temp 98.2 F (36.8 C) (Oral)   Resp 18   Ht 5\' 9"  (1.753 m)   Wt 165 lb (74.8 kg)   SpO2 99%   BMI 24.37 kg/m   Physical Exam  Constitutional: He is oriented to person, place, and time. He appears well-developed and well-nourished. No distress.  Cardiovascular: Normal rate, regular rhythm and normal heart sounds.  No murmur heard. Pulmonary/Chest: Effort normal and breath sounds normal. No respiratory distress. He has no wheezes.  Neurological: He is alert and oriented to person, place, and time.  Skin: Skin is warm and dry.  Psychiatric: He has a normal mood and affect. His behavior is normal. Judgment and thought content normal.  Nursing note and vitals reviewed.    MD Evaluation Airway: WNL Heart: WNL Abdomen: WNL Chest/ Lungs: WNL ASA  Classification: 3 Mallampati/Airway Score: Two   Imaging: Mr John Salazar  Contrast  Result Date: 07/30/2017 CLINICAL DATA:  Abnormal brain MRI. Imbalance. Ataxia. Diplopia. Concern for posterior circulation vascular disease. EXAM: MRA HEAD WITHOUT CONTRAST TECHNIQUE: Angiographic images of the Circle of Willis were obtained using MRA technique without intravenous contrast. COMPARISON:  04/29/2017 brain MRI.  No prior angiographic imaging. FINDINGS: The visualized distal vertebral arteries are patent to the basilar with irregularity and mild narrowing of the distal right V4 segment. AICAs are small and not well evaluated. Patent SCA origins are visualized bilaterally. The basilar artery is widely patent. Posterior communicating arteries are not identified. There are severe tandem stenoses involving the right P1 and proximal P2 segments. There is also a severe mid left P2 stenosis, and there is asymmetric attenuation of the more distal left P2 segment. The internal carotid arteries are patent  from skull base to carotid termini without evidence of significant stenosis. ACAs and MCAs are patent without evidence of proximal branch occlusion. There is moderate multifocal narrowing of the right M1 segment. There is also likely a moderate proximal right A1 stenosis. No significant left A1 or left M1 stenosis is identified. No intracranial aneurysm is identified. IMPRESSION: 1. Intracranial atherosclerosis including severe bilateral PCA stenoses. 2. Mild distal right V4 stenosis. 3. Moderate right M1 and right A1 stenoses. Electronically Signed   By: Logan Bores M.D.   On: 07/30/2017 16:36   Ir Angio Intra Extracran Sel Com Carotid Innominate Bilat Mod Sed  Result Date: 08/11/2017 CLINICAL DATA:  Vertebrobasilar ischemic symptoms with dizziness, gait imbalance, and subjective diplopia. Abnormal MRA MRI of the brain. EXAM: BILATERAL COMMON CAROTID AND INNOMINATE ANGIOGRAPHY; IR ANGIO VERTEBRAL SEL VERTEBRAL BILAT MOD SED COMPARISON:  MRI MRA of the brain 04/29/2017. MEDICATIONS: Heparin 1000  units IV; no antibiotic was administered within 1 hour of the procedure. ANESTHESIA/SEDATION: Versed 1 mg IV; Fentanyl 25 mcg IV. Moderate Sedation Time:  31 minutes. The patient was continuously monitored during the procedure by the interventional radiology nurse under my direct supervision. CONTRAST:  Isovue 300 approximately 60 mL. FLUOROSCOPY TIME:  Fluoroscopy Time: 10 minutes 42 seconds (979 mGy). COMPLICATIONS: None immediate. TECHNIQUE: Informed written consent was obtained from the patient after a thorough discussion of the procedural risks, benefits and alternatives. All questions were addressed. Maximal Sterile Barrier Technique was utilized including caps, mask, sterile gowns, sterile gloves, sterile drape, hand hygiene and skin antiseptic. A timeout was performed prior to the initiation of the procedure. The right groin was prepped and draped in the usual sterile fashion. Thereafter using modified Seldinger technique, transfemoral access into the right common femoral artery was obtained without difficulty. Over a 0.035 inch guidewire, a 5 French Pinnacle sheath was inserted. Through this, and also over 0.035 inch guidewire, a 5 [place] JB 1 catheter was advanced to the aortic arch region and selectively positioned in the right common carotid artery, the right vertebral artery, the left common carotid artery and the left vertebral artery. FINDINGS: The right common carotid arteriogram demonstrates the right external carotid artery and its major branches to be widely patent. The right internal carotid artery just distal to the bulb demonstrates a severe stenosis of approximately 80-85% related to a circumferential smooth plaque. There is no evidence of intraluminal filling defects or of ulcerations. More distally, the right internal carotid artery is seen to opacify normally to the cranial skull base. The petrous, cavernous and supraclinoid segments demonstrate wide patency. The right middle cerebral  artery and the right anterior cerebral artery opacify into the capillary and venous phases. The right middle cerebral artery has a 25% to 50% stenosis of the proximal right M1 segment, of approximately 70% involving the distal right middle cerebral artery M1 segment. The trifurcation branches opacify unimpeded. The right anterior cerebral artery opacifies into the capillary and venous phases. The right vertebral artery origin demonstrates approximately 60 to 65% stenosis associated with mild tortuosity just distal to its origin. This dominant right opacifies normally to the cranial skull base. Wide patency is seen of the right vertebrobasilar junction, and the right posterior-inferior cerebellar artery. The opacified portion of the basilar artery, the left posterior cerebral artery, the superior cerebellar arteries and the anterior-inferior cerebellar arteries is normal into the capillary and venous phases. The right posterior cerebral artery has mild tortuosity in the P1 segment associated with a high-grade approximately 80% stenosis  of the right posterior cerebral artery distal P1 segment. The left common carotid arteriogram demonstrates the left external carotid artery and its major branches to be widely patent. The left internal carotid artery at the bulb demonstrates an approximately 50% stenosis at the level of the bulb. Just distal to this there is mild irregularity along the posterior wall of the left internal carotid artery at the bulb and just distally signifying fine small ulcerations. Distal to this the left internal carotid artery is seen to opacify normally to the cranial skull base. The petrous, cavernous and supraclinoid segments demonstrate wide patency. There is an approximately 3.7 mm x 3 mm saccular outpouching projecting medially from the superior hypophyseal region of the left internal carotid artery at the level of the ophthalmic artery. The left middle cerebral artery and the left anterior  cerebral artery opacify normally into the capillary and venous phases. Cross filling via the anterior communicating artery of the right anterior cerebral artery A2 segment is seen. The origin of the left vertebral artery is widely patent. There is mild to moderate tortuosity just distal to its origin. More distally, the vessel is seen to opacify to the cranial skull base. A hypoplastic left posterior inferior cerebellar artery is seen. Also demonstrated distal to this is approximately 50% stenosis of the left vertebrobasilar junction seen best on the AP projections. More distally, the opacified portion of the basilar artery, the left posterior cerebral artery, the superior cerebellar arteries and the anterior-inferior cerebellar arteries is normal into the capillary and venous phases. Again demonstrated is the right posterior cerebral artery severe stenosis of approximately 80% in the P1 P2 regions. Unopacified blood is seen in the basilar artery from the contralateral vertebral artery. IMPRESSION: Approximately 80-85% stenosis of the right internal carotid artery just distal to the bulb by the NASCET criteria. Approximately 25-50% stenosis of the proximal right middle cerebral artery M1 segment, and of approximately 70% of the distal right middle cerebral artery M1 segment. Approximately 60-65% stenosis of the right vertebral artery just distal to its origin. Approximately 50% stenosis of the left internal carotid artery proximally associated with mild ulceration. Approximately 3.7 mm x 3.0 mm outpouching from the severe hypophyseal region of the left internal carotid artery at the level of the ophthalmic artery probably representing an aneurysm. PLAN: Findings were reviewed with the patient and the patient's family. The patient will be started on Plavix 75 mg a day and has been asked to continue taking his 81 mg of aspirin daily. Options of subsequent management were reviewed with the patient and the patient's  family. Given the patient's vague symptoms of lack of energy, and also the symptoms of dizziness, and visual problems, endovascular options of augmenting intracranial flow were all reviewed briefly. It was felt the patient be started on the dual anti-platelet therapy for now to see response to symptoms. Also more importantly to consider revascularization of the severe high-grade stenosis of the right internal carotid artery approximately as described above to prevent further hemodynamic, and also thromboembolic complications of ischemic stroke. The option of stent assisted revascularization with distal protection of the right internal carotid artery proximally were reviewed with the patient and the patient's family. Questions answered to their satisfaction. The patient would like to proceed with endovascular treatment of the right internal carotid artery severe high-grade stenosis. This was arranged with anesthesia support as soon as possible. The patient and family were asked call should they have any concerns or questions. They leave with good understanding  and agreement with the above management plan. Electronically Signed   By: Luanne Bras M.D.   On: 08/09/2017 13:03   Ir Angio Vertebral Sel Vertebral Bilat Mod Sed  Result Date: 08/11/2017 CLINICAL DATA:  Vertebrobasilar ischemic symptoms with dizziness, gait imbalance, and subjective diplopia. Abnormal MRA MRI of the brain. EXAM: BILATERAL COMMON CAROTID AND INNOMINATE ANGIOGRAPHY; IR ANGIO VERTEBRAL SEL VERTEBRAL BILAT MOD SED COMPARISON:  MRI MRA of the brain 04/29/2017. MEDICATIONS: Heparin 1000 units IV; no antibiotic was administered within 1 hour of the procedure. ANESTHESIA/SEDATION: Versed 1 mg IV; Fentanyl 25 mcg IV. Moderate Sedation Time:  31 minutes. The patient was continuously monitored during the procedure by the interventional radiology nurse under my direct supervision. CONTRAST:  Isovue 300 approximately 60 mL. FLUOROSCOPY TIME:   Fluoroscopy Time: 10 minutes 42 seconds (979 mGy). COMPLICATIONS: None immediate. TECHNIQUE: Informed written consent was obtained from the patient after a thorough discussion of the procedural risks, benefits and alternatives. All questions were addressed. Maximal Sterile Barrier Technique was utilized including caps, mask, sterile gowns, sterile gloves, sterile drape, hand hygiene and skin antiseptic. A timeout was performed prior to the initiation of the procedure. The right groin was prepped and draped in the usual sterile fashion. Thereafter using modified Seldinger technique, transfemoral access into the right common femoral artery was obtained without difficulty. Over a 0.035 inch guidewire, a 5 French Pinnacle sheath was inserted. Through this, and also over 0.035 inch guidewire, a 5 [place] JB 1 catheter was advanced to the aortic arch region and selectively positioned in the right common carotid artery, the right vertebral artery, the left common carotid artery and the left vertebral artery. FINDINGS: The right common carotid arteriogram demonstrates the right external carotid artery and its major branches to be widely patent. The right internal carotid artery just distal to the bulb demonstrates a severe stenosis of approximately 80-85% related to a circumferential smooth plaque. There is no evidence of intraluminal filling defects or of ulcerations. More distally, the right internal carotid artery is seen to opacify normally to the cranial skull base. The petrous, cavernous and supraclinoid segments demonstrate wide patency. The right middle cerebral artery and the right anterior cerebral artery opacify into the capillary and venous phases. The right middle cerebral artery has a 25% to 50% stenosis of the proximal right M1 segment, of approximately 70% involving the distal right middle cerebral artery M1 segment. The trifurcation branches opacify unimpeded. The right anterior cerebral artery opacifies  into the capillary and venous phases. The right vertebral artery origin demonstrates approximately 60 to 65% stenosis associated with mild tortuosity just distal to its origin. This dominant right opacifies normally to the cranial skull base. Wide patency is seen of the right vertebrobasilar junction, and the right posterior-inferior cerebellar artery. The opacified portion of the basilar artery, the left posterior cerebral artery, the superior cerebellar arteries and the anterior-inferior cerebellar arteries is normal into the capillary and venous phases. The right posterior cerebral artery has mild tortuosity in the P1 segment associated with a high-grade approximately 80% stenosis of the right posterior cerebral artery distal P1 segment. The left common carotid arteriogram demonstrates the left external carotid artery and its major branches to be widely patent. The left internal carotid artery at the bulb demonstrates an approximately 50% stenosis at the level of the bulb. Just distal to this there is mild irregularity along the posterior wall of the left internal carotid artery at the bulb and just distally signifying fine small ulcerations. Distal  to this the left internal carotid artery is seen to opacify normally to the cranial skull base. The petrous, cavernous and supraclinoid segments demonstrate wide patency. There is an approximately 3.7 mm x 3 mm saccular outpouching projecting medially from the superior hypophyseal region of the left internal carotid artery at the level of the ophthalmic artery. The left middle cerebral artery and the left anterior cerebral artery opacify normally into the capillary and venous phases. Cross filling via the anterior communicating artery of the right anterior cerebral artery A2 segment is seen. The origin of the left vertebral artery is widely patent. There is mild to moderate tortuosity just distal to its origin. More distally, the vessel is seen to opacify to the  cranial skull base. A hypoplastic left posterior inferior cerebellar artery is seen. Also demonstrated distal to this is approximately 50% stenosis of the left vertebrobasilar junction seen best on the AP projections. More distally, the opacified portion of the basilar artery, the left posterior cerebral artery, the superior cerebellar arteries and the anterior-inferior cerebellar arteries is normal into the capillary and venous phases. Again demonstrated is the right posterior cerebral artery severe stenosis of approximately 80% in the P1 P2 regions. Unopacified blood is seen in the basilar artery from the contralateral vertebral artery. IMPRESSION: Approximately 80-85% stenosis of the right internal carotid artery just distal to the bulb by the NASCET criteria. Approximately 25-50% stenosis of the proximal right middle cerebral artery M1 segment, and of approximately 70% of the distal right middle cerebral artery M1 segment. Approximately 60-65% stenosis of the right vertebral artery just distal to its origin. Approximately 50% stenosis of the left internal carotid artery proximally associated with mild ulceration. Approximately 3.7 mm x 3.0 mm outpouching from the severe hypophyseal region of the left internal carotid artery at the level of the ophthalmic artery probably representing an aneurysm. PLAN: Findings were reviewed with the patient and the patient's family. The patient will be started on Plavix 75 mg a day and has been asked to continue taking his 81 mg of aspirin daily. Options of subsequent management were reviewed with the patient and the patient's family. Given the patient's vague symptoms of lack of energy, and also the symptoms of dizziness, and visual problems, endovascular options of augmenting intracranial flow were all reviewed briefly. It was felt the patient be started on the dual anti-platelet therapy for now to see response to symptoms. Also more importantly to consider revascularization  of the severe high-grade stenosis of the right internal carotid artery approximately as described above to prevent further hemodynamic, and also thromboembolic complications of ischemic stroke. The option of stent assisted revascularization with distal protection of the right internal carotid artery proximally were reviewed with the patient and the patient's family. Questions answered to their satisfaction. The patient would like to proceed with endovascular treatment of the right internal carotid artery severe high-grade stenosis. This was arranged with anesthesia support as soon as possible. The patient and family were asked call should they have any concerns or questions. They leave with good understanding and agreement with the above management plan. Electronically Signed   By: Luanne Bras M.D.   On: 08/09/2017 13:03    Labs:  CBC: Recent Labs    08/09/17 0730 08/23/17 0645  WBC 5.9 4.5  HGB 14.7 13.5  HCT 44.1 41.3  PLT 182 183    COAGS: Recent Labs    08/09/17 0730  INR 1.03    BMP: Recent Labs    04/19/17  6546 08/09/17 0730  NA 141 141  K 4.1 3.8  CL 102 106  CO2 24 28  GLUCOSE 90 94  BUN 19 17  CALCIUM 10.4* 9.7  CREATININE 1.46* 1.52*  GFRNONAA 47* 44*  GFRAA 55* 51*    LIVER FUNCTION TESTS: No results for input(s): BILITOT, AST, ALT, ALKPHOS, PROT, ALBUMIN in the last 8760 hours.  TUMOR MARKERS: No results for input(s): AFPTM, CEA, CA199, CHROMGRNA in the last 8760 hours.  Assessment and Plan:  Right ICA stenosis. Plan for image-guided cerebral angiogram with possible right ICA stenosis angioplasty/stent placement today with Dr. Estanislado Pandy. Patient is NPO. Denies fever and WBCs WNL. INR pending. P2Y12 136 PRU 08/18/2017, P2Y12 from today pending.  Risks and benefits of cerebral angiogram with intervention were discussed with the patient including, but not limited to bleeding, infection, vascular injury, contrast induced renal failure, stroke or  even death. This interventional procedure involves the use of X-rays and because of the nature of the planned procedure, it is possible that we will have prolonged use of X-ray fluoroscopy. Potential radiation risks to you include (but are not limited to) the following: - A slightly elevated risk for cancer  several years later in life. This risk is typically less than 0.5% percent. This risk is low in comparison to the normal incidence of human cancer, which is 33% for women and 50% for men according to the [place]. - Radiation induced injury can include skin redness, resembling a rash, tissue breakdown / ulcers and hair loss (which can be temporary or permanent).  The likelihood of either of these occurring depends on the difficulty of the procedure and whether you are sensitive to radiation due to previous procedures, disease, or genetic conditions.  IF your procedure requires a prolonged use of radiation, you will be notified and given written instructions for further action.  It is your responsibility to monitor the irradiated area for the 2 weeks following the procedure and to notify your physician if you are concerned that you have suffered a radiation induced injury.   All of the patient's questions were answered, patient is agreeable to proceed. Consent signed and in chart.   Thank you for this interesting consult.  I greatly enjoyed meeting BERTRAN ZEIMET and look forward to participating in their care.  A copy of this report was sent to the requesting provider on this date.  Electronically Signed: Earley Abide, PA-C 08/23/2017, 8:13 AM   I spent a total of 20 Minutes in face to face in clinical consultation, greater than 50% of which was counseling/coordinating care for right ICA stenosis.

## 2017-08-23 NOTE — Progress Notes (Signed)
Dr. Estanislado Pandy at bedside and states heparin levels should be based on PTT levels rather than heparin levels. Pharmacy made aware by Dr. Estanislado Pandy. Order placed for PTT level

## 2017-08-23 NOTE — Procedures (Signed)
S/P RT common carotid arteriogram followed by stent assisted angioplasty with distal protection of severe  RT ICA prox stenosis .

## 2017-08-23 NOTE — Progress Notes (Signed)
Pt transferred to PACU, groin checked with RN; IR RN did I&O cath on pt per MD order and got 500-600cc clear yellow urine. Report given; IR team signing off

## 2017-08-23 NOTE — Progress Notes (Signed)
Verbal order received from Dr. Estanislado Pandy to not give the 325mg  of ASA or the niMODipine prior to surgery.   Jacqlyn Larsen, RN

## 2017-08-23 NOTE — Anesthesia Procedure Notes (Signed)
Procedure Name: MAC Date/Time: 08/23/2017 8:54 AM Performed by: Mariea Clonts, CRNA Pre-anesthesia Checklist: Patient identified, Emergency Drugs available, Suction available, Patient being monitored and Timeout performed Patient Re-evaluated:Patient Re-evaluated prior to induction Oxygen Delivery Method: Nasal cannula

## 2017-08-23 NOTE — Anesthesia Postprocedure Evaluation (Signed)
Anesthesia Post Note  Patient: John Salazar  Procedure(s) Performed: IR WITH ANESTHESIA STENT PLACEMENT (N/A )     Patient location during evaluation: PACU Anesthesia Type: MAC Level of consciousness: awake and alert Pain management: pain level controlled Vital Signs Assessment: post-procedure vital signs reviewed and stable Respiratory status: spontaneous breathing, nonlabored ventilation, respiratory function stable and patient connected to nasal cannula oxygen Cardiovascular status: stable and blood pressure returned to baseline Postop Assessment: no apparent nausea or vomiting Anesthetic complications: no    Last Vitals:  Vitals:   08/23/17 1330 08/23/17 1345  BP: (!) 100/56 105/60  Pulse: 99 94  Resp: 16 14  Temp:    SpO2: 100% 98%    Last Pain:  Vitals:   08/23/17 1315  TempSrc:   PainSc: 0-No pain                 Montez Hageman

## 2017-08-23 NOTE — Transfer of Care (Signed)
Immediate Anesthesia Transfer of Care Note  Patient: John Salazar  Procedure(s) Performed: IR WITH ANESTHESIA STENT PLACEMENT (N/A )  Patient Location: PACU  Anesthesia Type:MAC  Level of Consciousness: awake, alert  and oriented  Airway & Oxygen Therapy: Patient Spontanous Breathing and Patient connected to nasal cannula oxygen  Post-op Assessment: Report given to RN and Post -op Vital signs reviewed and stable  Post vital signs: Reviewed and stable  Last Vitals:  Vitals Value Taken Time  BP 117/74 08/23/2017 11:45 AM  Temp 36.2 C 08/23/2017 11:45 AM  Pulse 87 08/23/2017 11:55 AM  Resp 16 08/23/2017 11:56 AM  SpO2 100 % 08/23/2017 11:55 AM  Vitals shown include unvalidated device data.  Last Pain:  Vitals:   08/23/17 0733  TempSrc:   PainSc: 0-No pain      Patients Stated Pain Goal: 3 (24/23/53 6144)  Complications: No apparent anesthesia complications

## 2017-08-23 NOTE — Anesthesia Procedure Notes (Signed)
Arterial Line Insertion Start/End7/17/2019 8:30 AM, 08/23/2017 8:35 AM Performed by: CRNA  Patient location: Pre-op. Preanesthetic checklist: patient identified, IV checked, site marked, risks and benefits discussed, surgical consent, monitors and equipment checked, pre-op evaluation, timeout performed and anesthesia consent Left, radial was placed Catheter size: 20 G  Attempts: 1 Procedure performed without using ultrasound guided technique. Following insertion, dressing applied and Biopatch. Post procedure assessment: normal  Patient tolerated the procedure well with no immediate complications.

## 2017-08-23 NOTE — Progress Notes (Signed)
ANTICOAGULATION CONSULT NOTE - Initial Consult  Pharmacy Consult for heparin Indication: Post-interventional neuroradiological procedure  Allergies  Allergen Reactions  . Bee Venom Anaphylaxis and Shortness Of Breath  . Penicillins Rash    PATIENT HAS HAD A PCN REACTION WITH IMMEDIATE RASH, FACIAL/TONGUE/THROAT SWELLING, SOB, OR LIGHTHEADEDNESS WITH HYPOTENSION:  #  #  YES  #  #  Has patient had a PCN reaction causing severe rash involving mucus membranes or skin necrosis: no Has patient had a PCN reaction that required hospitalization: no Has patient had a PCN reaction occurring within the last 10 years: no     Patient Measurements: Height: 5\' 9"  (175.3 cm) Weight: 165 lb (74.8 kg) IBW/kg (Calculated) : 70.7 Heparin Dosing Weight: 74.8 kg  Vital Signs: Temp: 97.7 F (36.5 C) (07/17 1315) Temp Source: Oral (07/17 0653) BP: 106/69 (07/17 1415) Pulse Rate: 93 (07/17 1415)  Labs: Recent Labs    08/23/17 0645  HGB 13.5  HCT 41.3  PLT 183  LABPROT 13.8  INR 1.07  CREATININE 1.47*    Estimated Creatinine Clearance: 45.4 mL/min (A) (by C-G formula based on SCr of 1.47 mg/dL (H)).   Medical History: Past Medical History:  Diagnosis Date  . Anxiety   . BPH (benign prostatic hyperplasia)   . Chronic kidney disease    stage 2  . Erectile dysfunction   . GERD (gastroesophageal reflux disease)   . Hypercholesteremia   . Hypertension     Medications:  Medications Prior to Admission  Medication Sig Dispense Refill Last Dose  . acetaminophen (TYLENOL) 500 MG tablet Take 1,000 mg by mouth every 6 (six) hours as needed for headache.    08/22/2017 at Unknown time  . aspirin EC 81 MG tablet Take 81 mg by mouth daily.   08/23/2017 at 0500  . ezetimibe (ZETIA) 10 MG tablet Take 10 mg by mouth at bedtime.    08/22/2017 at Unknown time  . irbesartan-hydrochlorothiazide (AVALIDE) 150-12.5 MG tablet Take 1 tablet by mouth every morning.    08/22/2017 at Unknown time  . omeprazole  (PRILOSEC) 40 MG capsule Take 40 mg by mouth every morning.   08/23/2017 at 0500  . tamsulosin (FLOMAX) 0.4 MG CAPS capsule Take 0.4 mg by mouth every evening.    08/22/2017 at Unknown time  . ticagrelor (BRILINTA) 90 MG TABS tablet Take 90 mg by mouth 2 (two) times daily.   08/23/2017 at 0500  . omeprazole (PRILOSEC) 20 MG capsule Take 20 mg by mouth daily.    08/09/2017 at 0500    Assessment: Patient is post-interventional neuroradiological procedure. Pharmacy consulted to dose and monitor heparin. Was put on heparin 500 units/hr initially.  Goal of Therapy:  Heparin level 0.1-0.25 units/ml Monitor platelets by anticoagulation protocol: Yes   Plan:  Increase heparin infusion to 600 units/hr Check anti-Xa level in 8 hours and daily while on heparin Continue to monitor H&H and platelets   Thank you for allowing [place] to participate in this patients care.   Jens Som, PharmD Please utilize [employer] (under [place]) for appropriate number for your unit pharmacist. 08/23/2017 2:35 PM

## 2017-08-23 NOTE — Anesthesia Preprocedure Evaluation (Addendum)
Anesthesia Evaluation  Patient identified by MRN, date of birth, ID band Patient awake    Reviewed: Allergy & Precautions, NPO status , Patient's Chart, lab work & pertinent test results  Airway Mallampati: II  TM Distance: >3 FB Neck ROM: Full    Dental no notable dental hx. (+) Partial Upper   Pulmonary former smoker,    Pulmonary exam normal breath sounds clear to auscultation       Cardiovascular hypertension, Pt. on medications Normal cardiovascular exam Rhythm:Regular Rate:Normal     Neuro/Psych negative neurological ROS  negative psych ROS   GI/Hepatic negative GI ROS, Neg liver ROS,   Endo/Other  negative endocrine ROS  Renal/GU negative Renal ROS  negative genitourinary   Musculoskeletal negative musculoskeletal ROS (+)   Abdominal   Peds negative pediatric ROS (+)  Hematology negative hematology ROS (+)   Anesthesia Other Findings   Reproductive/Obstetrics negative OB ROS                            Anesthesia Physical Anesthesia Plan  ASA: III  Anesthesia Plan: MAC   Post-op Pain Management:    Induction: Intravenous  PONV Risk Score and Plan: 2 and Ondansetron  Airway Management Planned: Simple Face Mask  Additional Equipment:   Intra-op Plan:   Post-operative Plan:   Informed Consent: I have reviewed the patients History and Physical, chart, labs and discussed the procedure including the risks, benefits and alternatives for the proposed anesthesia with the patient or authorized representative who has indicated his/her understanding and acceptance.   Dental advisory given  Plan Discussed with: CRNA  Anesthesia Plan Comments:        Anesthesia Quick Evaluation

## 2017-08-23 NOTE — Progress Notes (Signed)
Saw patient in PACU following procedure. Patient underwent cerebral angiogram with stent assisted angioplasty of right proximal ICA.  Patient awake and alert laying in bed with no complaints at this time. Denies fever, chills, headache, weakness, numbness/tingling, dizziness, vision changes, hearing changes, tinnitus, or speech difficulty.  Alert, awake, and oriented x3. Speech and comprehension intact. PERRL bilaterally. EOMs intact bilaterally without nystagmus or subjective diplopia. Visual fields not assessed. No facial asymmetry. Tongue midline. Motor power symmetric proportional to effort. No pronator drift. Fine motor and coordination intact and symmetric. Gait not assessed. Romberg not assessed. Heel to toe not assessed. Distal pulses 2+ bilaterally. Right groin incision soft without active bleeding or hematoma.  Plan to transfer to neuro ICU for overnight observation. Raise HOB to 30 degrees at 17:00 today. Continue with hourly BP and neuro checks. IR to follow.  Bea Graff Louk, PA-C 08/23/2017, 4:24 PM

## 2017-08-24 ENCOUNTER — Encounter (HOSPITAL_COMMUNITY): Payer: Self-pay | Admitting: Interventional Radiology

## 2017-08-24 ENCOUNTER — Other Ambulatory Visit (HOSPITAL_COMMUNITY): Payer: Self-pay | Admitting: Radiology

## 2017-08-24 DIAGNOSIS — I6521 Occlusion and stenosis of right carotid artery: Secondary | ICD-10-CM

## 2017-08-24 LAB — BASIC METABOLIC PANEL
Anion gap: 7 (ref 5–15)
BUN: 19 mg/dL (ref 8–23)
CO2: 21 mmol/L — AB (ref 22–32)
CREATININE: 1.38 mg/dL — AB (ref 0.61–1.24)
Calcium: 8.9 mg/dL (ref 8.9–10.3)
Chloride: 111 mmol/L (ref 98–111)
GFR calc Af Amer: 57 mL/min — ABNORMAL LOW (ref >60)
GFR calc non Af Amer: 50 mL/min — ABNORMAL LOW (ref >60)
GLUCOSE: 172 mg/dL — AB (ref 70–99)
Potassium: 3.6 mmol/L (ref 3.5–5.1)
Sodium: 139 mmol/L (ref 135–145)

## 2017-08-24 LAB — CBC WITH DIFFERENTIAL/PLATELET
Abs Immature Granulocytes: 0 10*3/uL (ref 0.0–0.1)
BASOS PCT: 0 %
Basophils Absolute: 0 10*3/uL (ref 0.0–0.1)
EOS ABS: 0 10*3/uL (ref 0.0–0.7)
Eosinophils Relative: 0 %
HEMATOCRIT: 35.6 % — AB (ref 39.0–52.0)
Hemoglobin: 11.9 g/dL — ABNORMAL LOW (ref 13.0–17.0)
Immature Granulocytes: 0 %
Lymphocytes Relative: 9 %
Lymphs Abs: 0.6 10*3/uL — ABNORMAL LOW (ref 0.7–4.0)
MCH: 30.4 pg (ref 26.0–34.0)
MCHC: 33.4 g/dL (ref 30.0–36.0)
MCV: 91 fL (ref 78.0–100.0)
MONO ABS: 0.4 10*3/uL (ref 0.1–1.0)
MONOS PCT: 6 %
Neutro Abs: 6.2 10*3/uL (ref 1.7–7.7)
Neutrophils Relative %: 85 %
PLATELETS: 178 10*3/uL (ref 150–400)
RBC: 3.91 MIL/uL — ABNORMAL LOW (ref 4.22–5.81)
RDW: 12.8 % (ref 11.5–15.5)
WBC: 7.3 10*3/uL (ref 4.0–10.5)

## 2017-08-24 LAB — HEPARIN LEVEL (UNFRACTIONATED): Heparin Unfractionated: 0.1 IU/mL — ABNORMAL LOW (ref 0.30–0.70)

## 2017-08-24 LAB — MRSA PCR SCREENING: MRSA BY PCR: NEGATIVE

## 2017-08-24 NOTE — Progress Notes (Signed)
IV's removed. Discharge instructions given to patient and wife with verbalization of understanding. All belongings sent with wife. Cell phone and charger with wife. Patient wheeled out by nurse tech.

## 2017-08-24 NOTE — Discharge Summary (Signed)
Patient ID: John Salazar MRN: [id number] DOB/AGE: 1945-09-03 72 y.o.  Admit date: 08/23/2017 Discharge date: 08/24/2017  Supervising Physician: Luanne Bras  Patient Status: [facility] - In-pt  Admission Diagnoses: Carotid stenosis, symptomatic w/o infarct, right  Discharge Diagnoses:  Active Problems:   Carotid stenosis, symptomatic w/o infarct, right   Discharged Condition: stable  Hospital Course:  Patient presented to [facility] 08/23/2017 for an image-guided cerebral angiogram with stent assisted angioplasty of severe right ICA proximal stenosis with Dr. Estanislado Pandy. Procedure occurred without major complications, right groin intact, VSS. Patient was transferred to neuro ICU for overnight observation. No major events occurred overnight.  Patient awake and alert sitting in bed without complaints at this time. Accompanied by wife at bedside. Denies fever, chills, headache, weakness, numbness/tingling, dizziness, vision changes, hearing changes, tinnitus, or speech difficulty. Patient to be discharged home today and to follow-up in clinic with Dr. Estanislado Pandy 2 weeks after discharge.  Consults: None  Significant Diagnostic Studies: [name] NO Contrast  Result Date: 07/30/2017 CLINICAL DATA:  Abnormal brain MRI. Imbalance. Ataxia. Diplopia. Concern for posterior circulation vascular disease. EXAM: MRA HEAD WITHOUT CONTRAST TECHNIQUE: Angiographic images of the Circle of Willis were obtained using MRA technique without intravenous contrast. COMPARISON:  04/29/2017 brain MRI.  No prior angiographic imaging. FINDINGS: The visualized distal vertebral arteries are patent to the basilar with irregularity and mild narrowing of the distal right V4 segment. AICAs are small and not well evaluated. Patent SCA origins are visualized bilaterally. The basilar artery is widely patent. Posterior communicating arteries are not identified. There are severe tandem stenoses involving the right P1 and proximal  P2 segments. There is also a severe mid left P2 stenosis, and there is asymmetric attenuation of the more distal left P2 segment. The internal carotid arteries are patent from skull base to carotid termini without evidence of significant stenosis. ACAs and MCAs are patent without evidence of proximal branch occlusion. There is moderate multifocal narrowing of the right M1 segment. There is also likely a moderate proximal right A1 stenosis. No significant left A1 or left M1 stenosis is identified. No intracranial aneurysm is identified. IMPRESSION: 1. Intracranial atherosclerosis including severe bilateral PCA stenoses. 2. Mild distal right V4 stenosis. 3. Moderate right M1 and right A1 stenoses. Electronically Signed   By: Logan Bores M.D.   On: 07/30/2017 16:36   Ir Angio Intra Extracran Sel Com Carotid Innominate Bilat Mod Sed  Result Date: 08/11/2017 CLINICAL DATA:  Vertebrobasilar ischemic symptoms with dizziness, gait imbalance, and subjective diplopia. Abnormal MRA MRI of the brain. EXAM: BILATERAL COMMON CAROTID AND INNOMINATE ANGIOGRAPHY; IR ANGIO VERTEBRAL SEL VERTEBRAL BILAT MOD SED COMPARISON:  MRI MRA of the brain 04/29/2017. MEDICATIONS: Heparin 1000 units IV; no antibiotic was administered within 1 hour of the procedure. ANESTHESIA/SEDATION: Versed 1 mg IV; Fentanyl 25 mcg IV. Moderate Sedation Time:  31 minutes. The patient was continuously monitored during the procedure by the interventional radiology nurse under my direct supervision. CONTRAST:  Isovue 300 approximately 60 mL. FLUOROSCOPY TIME:  Fluoroscopy Time: 10 minutes 42 seconds (979 mGy). COMPLICATIONS: None immediate. TECHNIQUE: Informed written consent was obtained from the patient after a thorough discussion of the procedural risks, benefits and alternatives. All questions were addressed. Maximal Sterile Barrier Technique was utilized including caps, mask, sterile gowns, sterile gloves, sterile drape, hand hygiene and skin antiseptic.  A timeout was performed prior to the initiation of the procedure. The right groin was prepped and draped in the usual sterile fashion. Thereafter using  modified Seldinger technique, transfemoral access into the right common femoral artery was obtained without difficulty. Over a 0.035 inch guidewire, a 5 French Pinnacle sheath was inserted. Through this, and also over 0.035 inch guidewire, a 5 [place] JB 1 catheter was advanced to the aortic arch region and selectively positioned in the right common carotid artery, the right vertebral artery, the left common carotid artery and the left vertebral artery. FINDINGS: The right common carotid arteriogram demonstrates the right external carotid artery and its major branches to be widely patent. The right internal carotid artery just distal to the bulb demonstrates a severe stenosis of approximately 80-85% related to a circumferential smooth plaque. There is no evidence of intraluminal filling defects or of ulcerations. More distally, the right internal carotid artery is seen to opacify normally to the cranial skull base. The petrous, cavernous and supraclinoid segments demonstrate wide patency. The right middle cerebral artery and the right anterior cerebral artery opacify into the capillary and venous phases. The right middle cerebral artery has a 25% to 50% stenosis of the proximal right M1 segment, of approximately 70% involving the distal right middle cerebral artery M1 segment. The trifurcation branches opacify unimpeded. The right anterior cerebral artery opacifies into the capillary and venous phases. The right vertebral artery origin demonstrates approximately 60 to 65% stenosis associated with mild tortuosity just distal to its origin. This dominant right opacifies normally to the cranial skull base. Wide patency is seen of the right vertebrobasilar junction, and the right posterior-inferior cerebellar artery. The opacified portion of the basilar artery, the left  posterior cerebral artery, the superior cerebellar arteries and the anterior-inferior cerebellar arteries is normal into the capillary and venous phases. The right posterior cerebral artery has mild tortuosity in the P1 segment associated with a high-grade approximately 80% stenosis of the right posterior cerebral artery distal P1 segment. The left common carotid arteriogram demonstrates the left external carotid artery and its major branches to be widely patent. The left internal carotid artery at the bulb demonstrates an approximately 50% stenosis at the level of the bulb. Just distal to this there is mild irregularity along the posterior wall of the left internal carotid artery at the bulb and just distally signifying fine small ulcerations. Distal to this the left internal carotid artery is seen to opacify normally to the cranial skull base. The petrous, cavernous and supraclinoid segments demonstrate wide patency. There is an approximately 3.7 mm x 3 mm saccular outpouching projecting medially from the superior hypophyseal region of the left internal carotid artery at the level of the ophthalmic artery. The left middle cerebral artery and the left anterior cerebral artery opacify normally into the capillary and venous phases. Cross filling via the anterior communicating artery of the right anterior cerebral artery A2 segment is seen. The origin of the left vertebral artery is widely patent. There is mild to moderate tortuosity just distal to its origin. More distally, the vessel is seen to opacify to the cranial skull base. A hypoplastic left posterior inferior cerebellar artery is seen. Also demonstrated distal to this is approximately 50% stenosis of the left vertebrobasilar junction seen best on the AP projections. More distally, the opacified portion of the basilar artery, the left posterior cerebral artery, the superior cerebellar arteries and the anterior-inferior cerebellar arteries is normal into the  capillary and venous phases. Again demonstrated is the right posterior cerebral artery severe stenosis of approximately 80% in the P1 P2 regions. Unopacified blood is seen in the basilar artery from  the contralateral vertebral artery. IMPRESSION: Approximately 80-85% stenosis of the right internal carotid artery just distal to the bulb by the NASCET criteria. Approximately 25-50% stenosis of the proximal right middle cerebral artery M1 segment, and of approximately 70% of the distal right middle cerebral artery M1 segment. Approximately 60-65% stenosis of the right vertebral artery just distal to its origin. Approximately 50% stenosis of the left internal carotid artery proximally associated with mild ulceration. Approximately 3.7 mm x 3.0 mm outpouching from the severe hypophyseal region of the left internal carotid artery at the level of the ophthalmic artery probably representing an aneurysm. PLAN: Findings were reviewed with the patient and the patient's family. The patient will be started on Plavix 75 mg a day and has been asked to continue taking his 81 mg of aspirin daily. Options of subsequent management were reviewed with the patient and the patient's family. Given the patient's vague symptoms of lack of energy, and also the symptoms of dizziness, and visual problems, endovascular options of augmenting intracranial flow were all reviewed briefly. It was felt the patient be started on the dual anti-platelet therapy for now to see response to symptoms. Also more importantly to consider revascularization of the severe high-grade stenosis of the right internal carotid artery approximately as described above to prevent further hemodynamic, and also thromboembolic complications of ischemic stroke. The option of stent assisted revascularization with distal protection of the right internal carotid artery proximally were reviewed with the patient and the patient's family. Questions answered to their satisfaction.  The patient would like to proceed with endovascular treatment of the right internal carotid artery severe high-grade stenosis. This was arranged with anesthesia support as soon as possible. The patient and family were asked call should they have any concerns or questions. They leave with good understanding and agreement with the above management plan. Electronically Signed   By: Luanne Bras M.D.   On: 08/09/2017 13:03   Ir Angio Vertebral Sel Vertebral Bilat Mod Sed  Result Date: 08/11/2017 CLINICAL DATA:  Vertebrobasilar ischemic symptoms with dizziness, gait imbalance, and subjective diplopia. Abnormal MRA MRI of the brain. EXAM: BILATERAL COMMON CAROTID AND INNOMINATE ANGIOGRAPHY; IR ANGIO VERTEBRAL SEL VERTEBRAL BILAT MOD SED COMPARISON:  MRI MRA of the brain 04/29/2017. MEDICATIONS: Heparin 1000 units IV; no antibiotic was administered within 1 hour of the procedure. ANESTHESIA/SEDATION: Versed 1 mg IV; Fentanyl 25 mcg IV. Moderate Sedation Time:  31 minutes. The patient was continuously monitored during the procedure by the interventional radiology nurse under my direct supervision. CONTRAST:  Isovue 300 approximately 60 mL. FLUOROSCOPY TIME:  Fluoroscopy Time: 10 minutes 42 seconds (979 mGy). COMPLICATIONS: None immediate. TECHNIQUE: Informed written consent was obtained from the patient after a thorough discussion of the procedural risks, benefits and alternatives. All questions were addressed. Maximal Sterile Barrier Technique was utilized including caps, mask, sterile gowns, sterile gloves, sterile drape, hand hygiene and skin antiseptic. A timeout was performed prior to the initiation of the procedure. The right groin was prepped and draped in the usual sterile fashion. Thereafter using modified Seldinger technique, transfemoral access into the right common femoral artery was obtained without difficulty. Over a 0.035 inch guidewire, a 5 French Pinnacle sheath was inserted. Through this, and also  over 0.035 inch guidewire, a 5 [place] JB 1 catheter was advanced to the aortic arch region and selectively positioned in the right common carotid artery, the right vertebral artery, the left common carotid artery and the left vertebral artery. FINDINGS: The right common carotid  arteriogram demonstrates the right external carotid artery and its major branches to be widely patent. The right internal carotid artery just distal to the bulb demonstrates a severe stenosis of approximately 80-85% related to a circumferential smooth plaque. There is no evidence of intraluminal filling defects or of ulcerations. More distally, the right internal carotid artery is seen to opacify normally to the cranial skull base. The petrous, cavernous and supraclinoid segments demonstrate wide patency. The right middle cerebral artery and the right anterior cerebral artery opacify into the capillary and venous phases. The right middle cerebral artery has a 25% to 50% stenosis of the proximal right M1 segment, of approximately 70% involving the distal right middle cerebral artery M1 segment. The trifurcation branches opacify unimpeded. The right anterior cerebral artery opacifies into the capillary and venous phases. The right vertebral artery origin demonstrates approximately 60 to 65% stenosis associated with mild tortuosity just distal to its origin. This dominant right opacifies normally to the cranial skull base. Wide patency is seen of the right vertebrobasilar junction, and the right posterior-inferior cerebellar artery. The opacified portion of the basilar artery, the left posterior cerebral artery, the superior cerebellar arteries and the anterior-inferior cerebellar arteries is normal into the capillary and venous phases. The right posterior cerebral artery has mild tortuosity in the P1 segment associated with a high-grade approximately 80% stenosis of the right posterior cerebral artery distal P1 segment. The left common carotid  arteriogram demonstrates the left external carotid artery and its major branches to be widely patent. The left internal carotid artery at the bulb demonstrates an approximately 50% stenosis at the level of the bulb. Just distal to this there is mild irregularity along the posterior wall of the left internal carotid artery at the bulb and just distally signifying fine small ulcerations. Distal to this the left internal carotid artery is seen to opacify normally to the cranial skull base. The petrous, cavernous and supraclinoid segments demonstrate wide patency. There is an approximately 3.7 mm x 3 mm saccular outpouching projecting medially from the superior hypophyseal region of the left internal carotid artery at the level of the ophthalmic artery. The left middle cerebral artery and the left anterior cerebral artery opacify normally into the capillary and venous phases. Cross filling via the anterior communicating artery of the right anterior cerebral artery A2 segment is seen. The origin of the left vertebral artery is widely patent. There is mild to moderate tortuosity just distal to its origin. More distally, the vessel is seen to opacify to the cranial skull base. A hypoplastic left posterior inferior cerebellar artery is seen. Also demonstrated distal to this is approximately 50% stenosis of the left vertebrobasilar junction seen best on the AP projections. More distally, the opacified portion of the basilar artery, the left posterior cerebral artery, the superior cerebellar arteries and the anterior-inferior cerebellar arteries is normal into the capillary and venous phases. Again demonstrated is the right posterior cerebral artery severe stenosis of approximately 80% in the P1 P2 regions. Unopacified blood is seen in the basilar artery from the contralateral vertebral artery. IMPRESSION: Approximately 80-85% stenosis of the right internal carotid artery just distal to the bulb by the NASCET criteria.  Approximately 25-50% stenosis of the proximal right middle cerebral artery M1 segment, and of approximately 70% of the distal right middle cerebral artery M1 segment. Approximately 60-65% stenosis of the right vertebral artery just distal to its origin. Approximately 50% stenosis of the left internal carotid artery proximally associated with mild ulceration. Approximately  3.7 mm x 3.0 mm outpouching from the severe hypophyseal region of the left internal carotid artery at the level of the ophthalmic artery probably representing an aneurysm. PLAN: Findings were reviewed with the patient and the patient's family. The patient will be started on Plavix 75 mg a day and has been asked to continue taking his 81 mg of aspirin daily. Options of subsequent management were reviewed with the patient and the patient's family. Given the patient's vague symptoms of lack of energy, and also the symptoms of dizziness, and visual problems, endovascular options of augmenting intracranial flow were all reviewed briefly. It was felt the patient be started on the dual anti-platelet therapy for now to see response to symptoms. Also more importantly to consider revascularization of the severe high-grade stenosis of the right internal carotid artery approximately as described above to prevent further hemodynamic, and also thromboembolic complications of ischemic stroke. The option of stent assisted revascularization with distal protection of the right internal carotid artery proximally were reviewed with the patient and the patient's family. Questions answered to their satisfaction. The patient would like to proceed with endovascular treatment of the right internal carotid artery severe high-grade stenosis. This was arranged with anesthesia support as soon as possible. The patient and family were asked call should they have any concerns or questions. They leave with good understanding and agreement with the above management plan.  Electronically Signed   By: Luanne Bras M.D.   On: 08/09/2017 13:03    Discharge Exam: Blood pressure 133/68, pulse 74, temperature 97.8 F (36.6 C), temperature source Oral, resp. rate 20, height 5\' 9"  (1.753 m), weight 171 lb 4.8 oz (77.7 kg), SpO2 98 %. Physical Exam  Constitutional: He appears well-developed and well-nourished. No distress.  Cardiovascular: Normal rate, regular rhythm and normal heart sounds.  No murmur heard. Pulmonary/Chest: Effort normal and breath sounds normal. No respiratory distress. He has no wheezes.  Neurological:  Alert, awake, and oriented x3. Speech and comprehension intact. PERRL bilaterally. EOMs intact bilaterally without nystagmus or subjective diplopia. Visual fields not assessed. No facial asymmetry. Tongue midline. Motor power symmetric proportional to effort. No pronator drift. Fine motor and coordination intact and symmetric. Gait not assessed. Romberg not assessed. Heel to toe not assessed. Distal pulses 2+ bilaterally.  Skin: Skin is warm and dry.  Right groin incision soft without active bleeding or hematoma.  Psychiatric: He has a normal mood and affect. His behavior is normal. Judgment and thought content normal.  Nursing note and vitals reviewed.   Disposition: Discharge disposition: 01-Home or Self Care       Discharge Instructions    Call MD for:  difficulty breathing, headache or visual disturbances   Complete by:  As directed    Call MD for:  extreme fatigue   Complete by:  As directed    Call MD for:  hives   Complete by:  As directed    Call MD for:  persistant dizziness or light-headedness   Complete by:  As directed    Call MD for:  persistant nausea and vomiting   Complete by:  As directed    Call MD for:  redness, tenderness, or signs of infection (pain, swelling, redness, odor or green/yellow discharge around incision site)   Complete by:  As directed    Call MD for:  severe uncontrolled pain    Complete by:  As directed    Call MD for:  temperature >100.4   Complete by:  As directed  Diet - low sodium heart healthy   Complete by:  As directed    Discharge instructions   Complete by:  As directed    Continue taking Brilitna 90 mg twice daily. Continue taking Aspirin 81 mg once daily. No bending, stooping, or lifting more than 10 pounds for 2 weeks. No driving self for 2 weeks. Stay hydrated by drinking plenty of fluids. If you notice any new or worsening stroke symptoms, go to the emergency department immediately.     Allergies as of 08/24/2017      Reactions   Bee Venom Anaphylaxis, Shortness Of Breath   Penicillins Rash   PATIENT HAS HAD A PCN REACTION WITH IMMEDIATE RASH, FACIAL/TONGUE/THROAT SWELLING, SOB, OR LIGHTHEADEDNESS WITH HYPOTENSION:  #  #  YES  #  #  Has patient had a PCN reaction causing severe rash involving mucus membranes or skin necrosis: no Has patient had a PCN reaction that required hospitalization: no Has patient had a PCN reaction occurring within the last 10 years: no      Medication List    TAKE these medications   acetaminophen 500 MG tablet Commonly known as:  TYLENOL Take 1,000 mg by mouth every 6 (six) hours as needed for headache.   aspirin EC 81 MG tablet Take 81 mg by mouth daily.   ezetimibe 10 MG tablet Commonly known as:  ZETIA Take 10 mg by mouth at bedtime.   irbesartan-hydrochlorothiazide 150-12.5 MG tablet Commonly known as:  AVALIDE Take 1 tablet by mouth every morning.   omeprazole 40 MG capsule Commonly known as:  PRILOSEC Take 40 mg by mouth every morning. What changed:  Another medication with the same name was removed. Continue taking this medication, and follow the directions you see here.   tamsulosin 0.4 MG Caps capsule Commonly known as:  FLOMAX Take 0.4 mg by mouth every evening.   ticagrelor 90 MG Tabs tablet Commonly known as:  BRILINTA Take 90 mg by mouth 2 (two) times daily.      Follow-up  Information    Luanne Bras, MD Follow up.   Specialties:  Interventional Radiology, Radiology Why:  Please follow-up with Dr. Estanislado Pandy in clinic 2 weeks following discharge. Contact information: [place]  [id number] [phone]            Electronically Signed: Earley Abide, PA-C 08/24/2017, 10:25 AM   I have spent Less Than 30 Minutes discharging Karen Kitchens.

## 2017-08-24 NOTE — Progress Notes (Signed)
[place] for Heparin Indication: Post-interventional neuroradiological procedure  Allergies  Allergen Reactions  . Bee Venom Anaphylaxis and Shortness Of Breath  . Penicillins Rash    PATIENT HAS HAD A PCN REACTION WITH IMMEDIATE RASH, FACIAL/TONGUE/THROAT SWELLING, SOB, OR LIGHTHEADEDNESS WITH HYPOTENSION:  #  #  YES  #  #  Has patient had a PCN reaction causing severe rash involving mucus membranes or skin necrosis: no Has patient had a PCN reaction that required hospitalization: no Has patient had a PCN reaction occurring within the last 10 years: no     Patient Measurements: Height: 5\' 9"  (175.3 cm) Weight: 171 lb 4.8 oz (77.7 kg) IBW/kg (Calculated) : 70.7 Heparin Dosing Weight: 74.8 kg  Vital Signs: Temp: 98.1 F (36.7 C) (07/18 0000) Temp Source: Oral (07/18 0000) BP: 114/64 (07/18 0000) Pulse Rate: 85 (07/18 0100)  Labs: Recent Labs    08/23/17 0645 08/23/17 2300 08/24/17 0229  HGB 13.5  --  11.9*  HCT 41.3  --  35.6*  PLT 183  --  178  LABPROT 13.8  --   --   INR 1.07  --   --   HEPARINUNFRC  --  0.10*  --   CREATININE 1.47*  --   --     Estimated Creatinine Clearance: 45.4 mL/min (A) (by C-G formula based on SCr of 1.47 mg/dL (H)).   Medical History: Past Medical History:  Diagnosis Date  . Anxiety   . BPH (benign prostatic hyperplasia)   . Chronic kidney disease    stage 2  . Erectile dysfunction   . GERD (gastroesophageal reflux disease)   . Hypercholesteremia   . Hypertension     Medications:  Medications Prior to Admission  Medication Sig Dispense Refill Last Dose  . acetaminophen (TYLENOL) 500 MG tablet Take 1,000 mg by mouth every 6 (six) hours as needed for headache.    08/22/2017 at Unknown time  . aspirin EC 81 MG tablet Take 81 mg by mouth daily.   08/23/2017 at 0500  . ezetimibe (ZETIA) 10 MG tablet Take 10 mg by mouth at bedtime.    08/22/2017 at Unknown time  . irbesartan-hydrochlorothiazide  (AVALIDE) 150-12.5 MG tablet Take 1 tablet by mouth every morning.    08/22/2017 at Unknown time  . omeprazole (PRILOSEC) 40 MG capsule Take 40 mg by mouth every morning.   08/23/2017 at 0500  . tamsulosin (FLOMAX) 0.4 MG CAPS capsule Take 0.4 mg by mouth every evening.    08/22/2017 at Unknown time  . ticagrelor (BRILINTA) 90 MG TABS tablet Take 90 mg by mouth 2 (two) times daily.   08/23/2017 at 0500  . omeprazole (PRILOSEC) 20 MG capsule Take 20 mg by mouth daily.    08/09/2017 at 0500    Assessment: Patient is post-interventional neuroradiological procedure. Pharmacy consulted to dose and monitor heparin. Was put on heparin 500 units/hr initially.  Heparin level within goal range this AM at 0.1  Goal of Therapy:  Heparin level 0.1-0.25 units/ml, aPTT 50-60 Monitor platelets by anticoagulation protocol: Yes   Plan:  Cont heparin at 600 units/hr Heparin off at 0800 this AM per order admin instructions  Narda Bonds, PharmD, [facility] Pharmacist Phone: [phone]

## 2017-08-25 ENCOUNTER — Other Ambulatory Visit (HOSPITAL_COMMUNITY): Payer: Self-pay | Admitting: Interventional Radiology

## 2017-08-25 DIAGNOSIS — I771 Stricture of artery: Secondary | ICD-10-CM

## 2017-08-28 ENCOUNTER — Encounter (HOSPITAL_COMMUNITY): Payer: Self-pay | Admitting: Interventional Radiology

## 2017-09-04 ENCOUNTER — Telehealth: Payer: Self-pay | Admitting: Student

## 2017-09-04 ENCOUNTER — Telehealth (HOSPITAL_COMMUNITY): Payer: Self-pay

## 2017-09-04 NOTE — Telephone Encounter (Signed)
Received message from patient regarding dizziness. Patient underwent an image-guided cerebral angiogram with stent assisted angioplasty of right ICA proximal stenosis 08/23/2017 with Dr. Estanislado Pandy. Patient is currently taking Brilinta 90 mg twice daily and Aspirin 81 mg once daily.  Patient states that he has had dizziness for the past few days. States that the dizziness is worsening, and he is now "couch bound" due to dizziness. States he thinks dizziness is related to [place] use, and is curious if he can take something in place of Brilinta.  Patient also curious on restrictions. States that he was informed of restrictions on discharge (no bending, stooping, or lifting more than 10 pounds; no driving self) and is curious when his restrictions can be stopped.  Discussed case with Dr. Estanislado Pandy.  Informed patient, per Dr. Estanislado Pandy, that we are going to switch his medications due to his dizziness. Instructed patient to discontinue Brilinta use. Instructed patient to begin taking Plavix 75 mg once daily. Instructed patient to continue taking Aspirin 81 mg once daily.  Instructed patient to continue with restrictions (no bending, stooping, or lifting more than 10 pounds; no driving self) until his follow-up appointment with Dr. Estanislado Pandy 09/12/2017.  All questions answered and concerns addressed. Patient conveys understanding and agrees with plan.  Bea Graff Louk, PA-C 09/04/2017, 9:44 AM

## 2017-09-04 NOTE — Telephone Encounter (Signed)
Pt called and stated that since he's been taking the Brilinta, he's had lots of dizziness. He's had so much dizziness that he has to stay home now. He would like some advise on what to do especially if he has to take this medication for the next year. I informed pt that I would give the message to [facility] and have her call him back. He agreed with this plan. AW

## 2017-09-11 ENCOUNTER — Ambulatory Visit: Payer: Self-pay | Admitting: Neurology

## 2017-09-11 ENCOUNTER — Encounter

## 2017-09-12 ENCOUNTER — Ambulatory Visit (HOSPITAL_COMMUNITY)
Admission: RE | Admit: 2017-09-12 | Discharge: 2017-09-12 | Disposition: A | Discharge status: Home / Self Care | Payer: PPO | Source: Ambulatory Visit | Attending: Interventional Radiology | Admitting: Interventional Radiology

## 2017-09-12 ENCOUNTER — Telehealth: Payer: Self-pay | Admitting: Student

## 2017-09-12 DIAGNOSIS — N189 Chronic kidney disease, unspecified: Secondary | ICD-10-CM | POA: Insufficient documentation

## 2017-09-12 DIAGNOSIS — Z880 Allergy status to penicillin: Secondary | ICD-10-CM | POA: Insufficient documentation

## 2017-09-12 DIAGNOSIS — Z7982 Long term (current) use of aspirin: Secondary | ICD-10-CM | POA: Insufficient documentation

## 2017-09-12 DIAGNOSIS — Z8774 Personal history of (corrected) congenital malformations of heart and circulatory system: Secondary | ICD-10-CM | POA: Insufficient documentation

## 2017-09-12 DIAGNOSIS — I129 Hypertensive chronic kidney disease with stage 1 through stage 4 chronic kidney disease, or unspecified chronic kidney disease: Secondary | ICD-10-CM | POA: Diagnosis not present

## 2017-09-12 DIAGNOSIS — Z09 Encounter for follow-up examination after completed treatment for conditions other than malignant neoplasm: Secondary | ICD-10-CM | POA: Diagnosis not present

## 2017-09-12 DIAGNOSIS — I771 Stricture of artery: Secondary | ICD-10-CM

## 2017-09-12 DIAGNOSIS — E7800 Pure hypercholesterolemia, unspecified: Secondary | ICD-10-CM | POA: Insufficient documentation

## 2017-09-12 DIAGNOSIS — N400 Enlarged prostate without lower urinary tract symptoms: Secondary | ICD-10-CM | POA: Diagnosis not present

## 2017-09-12 DIAGNOSIS — F419 Anxiety disorder, unspecified: Secondary | ICD-10-CM | POA: Insufficient documentation

## 2017-09-12 DIAGNOSIS — K219 Gastro-esophageal reflux disease without esophagitis: Secondary | ICD-10-CM | POA: Insufficient documentation

## 2017-09-12 DIAGNOSIS — Z7902 Long term (current) use of antithrombotics/antiplatelets: Secondary | ICD-10-CM | POA: Insufficient documentation

## 2017-09-12 DIAGNOSIS — Z87891 Personal history of nicotine dependence: Secondary | ICD-10-CM | POA: Insufficient documentation

## 2017-09-12 LAB — PLATELET INHIBITION P2Y12: Platelet Function  P2Y12: 139 [PRU] — ABNORMAL LOW (ref 194–418)

## 2017-09-12 NOTE — Progress Notes (Signed)
Received patient's P2Y12 result from today- 139 PRU. Patient is currently taking Plavix 75 mg once daily and Aspirin 81 mg once daily.  Reviewed lab result with Dr. Estanislado Pandy. Per Dr. Estanislado Pandy, no indication to change medications based on lab value.  Patient to continue taking Plavix 75 mg once daily and Aspirin 81 mg once daily.  Bea Graff Haydon Dorris, PA-C 09/12/2017, 3:45 PM

## 2017-09-12 NOTE — Consult Note (Signed)
Chief Complaint: Patient was seen in consultation today for right ICA proximal stenosis s/p revascularization.  Referring Physician(s): None  Supervising Physician: Luanne Bras  Patient Status: [facility] - Out-pt  History of Present Illness: John Salazar is a 72 y.o. male with a past medical history of hypertension, hypercholesteremia, GERD, CKD, BPJ, ED, and anxiety. He was referred to Dr. Estanislado Pandy by Dr. Jaynee Eagles for management of dizziness. He underwent a diagnostic cerebral angiogram with Dr. Estanislado Pandy 08/09/2017. He then underwent a cerebral angiogram with revascularization of his right ICA proximal stenosis with Dr. Estanislado Pandy 08/23/2017.  Patient presents today for follow-up regarding his recent procedure 08/23/2017. Patient awake and alert sitting in chair. Accompanied by wife. States that since procedure, his symptoms are "better but not 100% better". Complains of dizziness, stable at this time. Complains of diplopia, stable at this time. Denies headache, weakness, numbness/tingling, syncope, blurred vision, hearing changes, tinnitus, or speech difficulty.  Patient is currently taking Plavix 75 mg once daily and Aspirin 81 mg once daily.   Past Medical History:  Diagnosis Date  . Anxiety   . BPH (benign prostatic hyperplasia)   . Chronic kidney disease    stage 2  . Erectile dysfunction   . GERD (gastroesophageal reflux disease)   . Hypercholesteremia   . Hypertension     Past Surgical History:  Procedure Laterality Date  . APPENDECTOMY    . CATARACT EXTRACTION W/ INTRAOCULAR LENS  IMPLANT, BILATERAL    . COLONOSCOPY  10/2012  . ESOPHAGOGASTRODUODENOSCOPY ENDOSCOPY  10/2012  . HERNIA REPAIR    . IR ANGIO INTRA EXTRACRAN SEL COM CAROTID INNOMINATE BILAT MOD SED  08/09/2017  . IR ANGIO VERTEBRAL SEL VERTEBRAL BILAT MOD SED  08/09/2017  . IR INTRAVSC STENT CERV CAROTID W/EMB-PROT MOD SED INCL ANGIO  08/23/2017  . PROSTATE SURGERY     20 years ago  . RADIOLOGY WITH  ANESTHESIA N/A 08/23/2017   Procedure: IR WITH ANESTHESIA STENT PLACEMENT;  Surgeon: Luanne Bras, MD;  Location: [place];  Service: Radiology;  Laterality: N/A;    Allergies: Bee venom and Penicillins  Medications: Prior to Admission medications   Medication Sig Start Date End Date Taking? Authorizing Provider  acetaminophen (TYLENOL) 500 MG tablet Take 1,000 mg by mouth every 6 (six) hours as needed for headache.     [provider]  aspirin EC 81 MG tablet Take 81 mg by mouth daily.    [provider]  ezetimibe (ZETIA) 10 MG tablet Take 10 mg by mouth at bedtime.     [provider]  irbesartan-hydrochlorothiazide (AVALIDE) 150-12.5 MG tablet Take 1 tablet by mouth every morning.     [provider]  omeprazole (PRILOSEC) 40 MG capsule Take 40 mg by mouth every morning.    [provider]  tamsulosin (FLOMAX) 0.4 MG CAPS capsule Take 0.4 mg by mouth every evening.     [provider]  ticagrelor (BRILINTA) 90 MG TABS tablet Take 90 mg by mouth 2 (two) times daily.    [provider]     Family History  Problem Relation Age of Onset  . Aneurysm Father   . Breast cancer Sister   . Colon cancer Sister     Social History   Socioeconomic History  . Marital status: Married    Spouse name: pamela  . Number of children: 2  . Years of education: 37  . Highest education level: Not on file  Occupational History  . Occupation: Oceanographer.  Social Needs  . Financial resource strain: Not on file  . Food insecurity:    Worry: Not on file    Inability: Not on file  . Transportation needs:    Medical: Not on file    Non-medical: Not on file  Tobacco Use  . Smoking status: Former Smoker    Types: Cigarettes    Last attempt to quit: 03/10/1996    Years since quitting: 21.5  . Smokeless tobacco: Never Used  Substance and Sexual Activity  . Alcohol use: No    Alcohol/week: 0.0 oz  . Drug use: No  . Sexual  activity: Not on file  Lifestyle  . Physical activity:    Days per week: Not on file    Minutes per session: Not on file  . Stress: Not on file  Relationships  . Social connections:    Talks on phone: Not on file    Gets together: Not on file    Attends religious service: Not on file    Active member of club or organization: Not on file    Attends meetings of clubs or organizations: Not on file    Relationship status: Not on file  Other Topics Concern  . Not on file  Social History Narrative  . Not on file     Review of Systems: A 12 point ROS discussed and pertinent positives are indicated in the HPI above.  All other systems are negative.  Review of Systems  Constitutional: Negative for chills and fever.  HENT: Negative for hearing loss and tinnitus.   Eyes: Positive for visual disturbance.  Respiratory: Negative for shortness of breath and wheezing.   Cardiovascular: Negative for chest pain and palpitations.  Neurological: Positive for dizziness. Negative for syncope, speech difficulty, weakness, numbness and headaches.  Psychiatric/Behavioral: Negative for behavioral problems and confusion.    Vital Signs: There were no vitals taken for this visit.  Physical Exam  Constitutional: He is oriented to person, place, and time. He appears well-developed and well-nourished. No distress.  Pulmonary/Chest: Effort normal. No respiratory distress.  Neurological: He is alert and oriented to person, place, and time.  Skin: Skin is warm and dry.  Psychiatric: He has a normal mood and affect. His behavior is normal. Judgment and thought content normal.  Nursing note and vitals reviewed.    Imaging: Ir Sherre Lain Stent Cerv Carotid W/emb-prot Mod Sed  Result Date: 08/28/2017 CLINICAL DATA:  Patient with a history of symptomatic severe right internal carotid artery stenosis proximally. EXAM: INTRAVSC STENT CERV CAROTID W/ EMB-PROT COMPARISON:  Diagnostic catheter arteriogram of  08/09/2017. MEDICATIONS: Heparin 3,500 units IV; vancomycin 1 g IV antibiotic was administered within 1 hour of the procedure. ANESTHESIA/SEDATION: Mac anesthesia as per the Department of Anesthesiology at [place]:  Isovue 300 approximately 100 cc. FLUOROSCOPY TIME:  Fluoroscopy Time: 36 minutes 12 seconds (1193 mGy). COMPLICATIONS: Nonnone immediatee immediate. TECHNIQUE: Informed written consent was obtained from the patient after a thorough discussion of the procedural risks, benefits and alternatives. All questions were addressed. Maximal Sterile Barrier Technique was utilized including caps, mask, sterile gowns, sterile gloves, sterile drape, hand hygiene and skin antiseptic. A timeout was performed prior to the initiation of the procedure. The right groin was prepped and draped in the usual sterile fashion. Thereafter using modified Seldinger technique, transfemoral access into the right common femoral artery was obtained without difficulty. Over a 0.035 inch guidewire, a 5 French Pinnacle sheath was inserted. Through this, and also over 0.035  inch guidewire, a 5 [place] JB 1 catheter was advanced to the aortic arch region and selectively positioned in the right common carotid artery. FINDINGS: The right common carotid arteriogram again demonstrates the right external carotid artery and its major branches to be widely patent. The right internal carotid artery just distal to the bulb demonstrates a severe almost 90% narrowing by the NASCET criteria related to circumferential smooth plaque. Distal to this the right internal carotid artery is seen to opacify to the cranial skull base. The petrous segment is widely patent. There is mild stenosis of the petrous cavernous junction. More distally, the cavernous and the supraclinoid segments demonstrate wide patency. A right middle cerebral artery demonstrates an approximately 50% stenosis in the right middle cerebral artery M1 segment. The  trifurcation branches are seen to opacify into the capillary and venous phases. A right anterior cerebral artery is seen to opacify proximally and distally into the A2 A3 territory. ENDOVASCULAR REVASCULARIZATION OF SEVERELY STENOTIC RIGHT INTERNAL CAROTID ARTERY PROXIMALLY WITH STENT ASSISTED ANGIOPLASTY WITH DISTAL PROTECTION The diagnostic JB 1 catheter in the right common carotid artery was exchanged over a 0.035 inch 300 cm Rosen exchange guidewire for a 6 French 80 cm Cook shuttle sheath using biplane roadmap technique and constant fluoroscopic guidance. Good aspiration obtained from the hub of the [facility] shuttle sheath. A gentle contrast injection demonstrated no change in the extracranial or intracranial circulation. Measurements were then performed of the right internal carotid artery in its normal segment just distal to the area of severe stenosis. Also obtained was measurements of the right common carotid artery just proximal to the right common carotid bifurcation. A 4/7 mm EMBO SHIELD and 80 device was then prepped in its housing. Bubbles were cleared off of the filter portion of the distal protection device and also of the delivery microcatheter. The filter device was then captured into the delivery microcatheter. The combination of the delivery microcatheter and the delivery micro guidewire were then retrieved from the housing and J configuration given to the distal 0.014 inch micro guidewire. Thereafter using, biplane roadmap technique and constant fluoroscopic guidance, in a coaxial manner and with constant heparinized saline infusion the combination of the delivery microcatheter of the distal capture device, and the micro guidewire were then advanced to the distal portion of the [employer] sheath. Using a torque device, and biplane roadmap technique and constant fluoroscopic guidance, access to the tight narrowing of the right internal carotid artery was obtained without difficulty followed by the  advancement of the wire, the delivery microcatheter of the distal protection device to the distal portion of the right internal carotid artery petrous horizontal segment. Once there, the distal deployment of the filter device was then achieved by unsheathing it proximally. Once the filter had been deployed, the delivery sheath was then gently retrieved and removed with constant attention to the distal portion of the filter. The filter delivery catheter was then retrieved and removed. A control arteriogram performed through the [place] shuttle sheath in the right common carotid artery demonstrated no evidence of spasm or of dissections. No change was noted intracranially. At this time, in a coaxial manner and with constant heparinized saline infusion using biplane roadmap technique and constant fluoroscopic guidance, a 4 mm x 20 mm 014 Viatrac angioplasty balloon catheter which had been retrogradely prepped and purged with heparinized saline infusion was advanced using the rapid exchange technique to the area of tight stenosis of the right internal carotid artery. The markers on  the balloon were then appropriately aligned related to the severe stenosis. Thereafter, control inflation was performed using a micro inflation syringe device via micro tubing to 10 atmospheres. The balloon was left inflated at 4.08 mm for approximately 45 seconds. It was then deflated and retrieved and removed again using the rapid exchange technique. A control arteriogram performed through the [facility] shuttle sheath in the right internal carotid artery demonstrates significant improved caliber and flow through the angioplastied segment. At this time, it was decided to use a 8-6 mm x 40 mm stent across the angioplastied segment and also to cover the carotid bulb and into the right common carotid bifurcation. The stent delivery catheter was then prepped and purged with heparinized saline infusion retrogradely. Using the rapid exchange  technique, the stent delivery catheter was advanced over the exchange micro guidewire and positioned such that the distal and the proximal portions were in the normal appearing segments of the internal carotid artery, and the common carotid artery. After having ascertained the distal and the proximal landing zones, this stent was then deployed without any difficulty. The delivery apparatus was retrieved and removed. The distal filter remained stable. A control arteriogram performed through the [place] shuttle sheath demonstrated significant improved caliber and flow through the stented segment. There continued to be waist with less than 1 cm coverage distal to the area of stenosis at the distal portion of the stent. The waist area was then angioplastied with a Viatrac 014 5 mm x 30 mm angioplasty balloon catheter which was prepped and purged with heparinized saline infusion retrogradely as described earlier. Again using the rapid exchange technique this was advanced and positioned such that the distal and the proximal markers were appropriately aligned. Thereafter, slow inflation was performed using a micro inflation syringe device via micro tubing to a diameter of 5.14 mm maintained for 40 seconds. This was then deflated and retrieved and removed. A control arteriogram performed through the [place] shuttle sheath demonstrated significant caliber and flow through the entirety of the stented segment and intracranially. Given the small coverage distal to the actual diseased segment, it was elected to place a second stent device with measurements of 9 mm/7 mm x 40 mm. Again as described above, this stent apparatus was prepped with heparinized saline infusion and then advanced using the rapid exchange technique. The distal portion of this stent was deployed at least 1.5 cm distal to the initial stent. Again after having deployed, the delivery process was retrieved and removed. Control arteriogram performed  through the [facility] shuttle sheath demonstrated excellent apposition and flow through the stented segment and intracranially. Following the placement of the second stent, the filter capture catheter microcatheter was advanced again in a coaxial manner using the rapid exchange technique to the proximal portion of the device. By retrieving on the delivery micro guidewire, the filter was captured into the delivery microcatheter and retrieved and removed under constant fluoroscopic guidance. Angiograms were then obtained through the [facility] shuttle sheath at 15,30 and 40 minutes post second stent deployment. These continued to maintain excellent apposition and lack of irregularity within the stented segment of the distal common carotid artery and proximal internal carotid artery. Intracranially there was significant improved hemodynamic flow into the right middle cerebral artery and the right anterior cerebral artery distributions. Throughout the procedure, the patient's hemodynamic status and neurological status remained stable. No evidence of bradycardia or hemodynamic consequences were noted at the time of the procedure. The [place] shuttle sheath was  then retrieved into the abdominal aorta and exchanged for a J-tip guidewire for a 6 French Pinnacle sheath. This in turn was then removed with the application of an ExoSeal closure device followed by external pressure. During manual compression, the patient did have a transient episode of vasovagal episode where he complained of nausea and diaphoresis with the blood pressure dropping to the 80s over 50s. The patient's heart rate remained in the region of approximately 55 beats per minute. This responded appropriately to fluid challenge, and also to vasoconstriction with medication as per CRNA. The patient remained neurologically stable. The right groin appeared soft without evidence of a hematoma or bleeding. Distal pulses remained palpable in the dorsalis pedis, and posterior  regions at the end of the procedure. The patient was then transferred to the neuro ICU for overnight observations on low-dose IV heparin and neurological observations. Overnight the patient had no neurological or systemic complications. The patient was able to tolerate a free liquid meal in the late evening. The following day, the IV heparin was stopped and the patient was switched to aspirin 81 mg a day, and Brilinta 90 mg twice a day. Prior to discharge the patient was able to ambulate independently. The patient was able to take a full meal intact without difficulty. He was then discharged home under the care of his family with special instructions to continue on maintaining his hydration, continue his anti-platelet therapy, his blood pressure medication and also his cholesterol lowering medications. He was advised not to drive for a couple of weeks. He was also advised to refrain from stooping, bending or lifting 10 pounds or more. Questions were answered to his satisfaction. He was then discharged under the care of his wife. They both were informed to call 911 should he develop stroke-like symptoms such as speech difficulties, facial weakness, gait imbalance or visual problems. The patient will be seen in the clinic in approximately 2 weeks time. IMPRESSION: Status post endovascular revascularization of symptomatic severe high-grade stenosis of the right internal carotid artery proximally with stent assisted angioplasty and distal protection as described above. PLAN: Follow-up of in the clinic in 2 weeks time. INDICATION: Patient with a history of severe right internal carotid artery stenosis with right cerebral hemisphere ischemic symptoms. Electronically Signed   By: Luanne Bras M.D.   On: 08/24/2017 15:14    Labs:  CBC: Recent Labs    08/09/17 0730 08/23/17 0645 08/24/17 0229  WBC 5.9 4.5 7.3  HGB 14.7 13.5 11.9*  HCT 44.1 41.3 35.6*  PLT 182 183 178    COAGS: Recent Labs     08/09/17 0730 08/23/17 0645  INR 1.03 1.07    BMP: Recent Labs    04/19/17 0829 08/09/17 0730 08/23/17 0645 08/24/17 0229  NA 141 141 141 139  K 4.1 3.8 3.6 3.6  CL 102 106 110 111  CO2 _0 21*  GLUCOSE 90 94 96 172*  BUN _1 CALCIUM 10.4* 9.7 9.5 8.9  CREATININE 1.46* 1.52* 1.47* 1.38*  GFRNONAA 47* 44* 46* 50*  GFRAA 55* 51* 53* 57*    LIVER FUNCTION TESTS: No results for input(s): BILITOT, AST, ALT, ALKPHOS, PROT, ALBUMIN in the last 8760 hours.  TUMOR MARKERS: No results for input(s): AFPTM, CEA, CA199, CHROMGRNA in the last 8760 hours.  Assessment and Plan:  Right ICA proximal stenosis s/p revascularization 08/23/2017 with Dr. Estanislado Pandy. Reviewed imaging with patient and wife. Explained that the best course of management for patient's  stenosis at this time is monitoring with routine imaging.  Plan for follow-up carotid ultrasound in 4 months from treatment (4 months from 08/23/2017). Informed patient that our schedulers will call him to set up this imaging scan. Will order P2Y12 today to check effectiveness of Plavix use. Instructed patient to continue taking Plavix 75 mg once daily and Aspirin 81 mg once daily. Instructed patient to return to baseline function slowly. Informed patient that if he notices any left-sided weakness, unsteady gait, or worsening of diplopia, to call us immediately.  All questions answered and concerns addressed. Patient and wife convey understanding and agree with plan.  Thank you for this interesting consult.  I greatly enjoyed meeting ENMANUEL ZUFALL and look forward to participating in their care.  A copy of this report was sent to the requesting provider on this date.  Electronically Signed: Earley Abide, PA-C 09/12/2017, 10:12 AM   I spent a total of 25 Minutes in face to face in clinical consultation, greater than 50% of which was counseling/coordinating care for right ICA proximal stenosis s/p  revascularization.

## 2017-09-13 ENCOUNTER — Other Ambulatory Visit (HOSPITAL_COMMUNITY): Payer: Self-pay | Admitting: Interventional Radiology

## 2017-09-20 ENCOUNTER — Telehealth (HOSPITAL_COMMUNITY): Payer: Self-pay

## 2017-09-20 NOTE — Telephone Encounter (Signed)
Pt called because his BP has been low lately. He wasn't sure if this was related to his procedure and stent. Per Dr. Estanislado Pandy, this is not related and for pt to f/u with PCP about low BP. Pt agreed with this plan. AW

## 2017-11-07 DIAGNOSIS — Z79899 Other long term (current) drug therapy: Secondary | ICD-10-CM | POA: Diagnosis not present

## 2017-11-07 DIAGNOSIS — Z125 Encounter for screening for malignant neoplasm of prostate: Secondary | ICD-10-CM | POA: Diagnosis not present

## 2017-11-07 DIAGNOSIS — E782 Mixed hyperlipidemia: Secondary | ICD-10-CM | POA: Diagnosis not present

## 2017-11-07 DIAGNOSIS — Z23 Encounter for immunization: Secondary | ICD-10-CM | POA: Diagnosis not present

## 2017-11-07 DIAGNOSIS — I10 Essential (primary) hypertension: Secondary | ICD-10-CM | POA: Diagnosis not present

## 2017-11-08 DIAGNOSIS — Z125 Encounter for screening for malignant neoplasm of prostate: Secondary | ICD-10-CM | POA: Diagnosis not present

## 2017-11-08 DIAGNOSIS — E782 Mixed hyperlipidemia: Secondary | ICD-10-CM | POA: Diagnosis not present

## 2017-11-08 DIAGNOSIS — Z23 Encounter for immunization: Secondary | ICD-10-CM | POA: Diagnosis not present

## 2017-11-08 DIAGNOSIS — I10 Essential (primary) hypertension: Secondary | ICD-10-CM | POA: Diagnosis not present

## 2017-11-08 DIAGNOSIS — Z79899 Other long term (current) drug therapy: Secondary | ICD-10-CM | POA: Diagnosis not present

## 2017-11-08 LAB — BASIC METABOLIC PANEL
BUN: 16 (ref 4–21)
Creatinine: 1.5 — AB (ref 0.6–1.3)
Glucose: 89
Potassium: 3.8 (ref 3.4–5.3)
SODIUM: 142 (ref 137–147)

## 2017-11-09 LAB — LIPID PANEL
CHOLESTEROL: 131 (ref 0–200)
HDL: 40 (ref 35–70)
LDL CALC: 62
TRIGLYCERIDES: 147 (ref 40–160)

## 2017-11-09 LAB — HEPATIC FUNCTION PANEL
ALT: 15 (ref 10–40)
AST: 17 (ref 14–40)
Bilirubin, Total: 0.5

## 2017-12-12 DIAGNOSIS — A09 Infectious gastroenteritis and colitis, unspecified: Secondary | ICD-10-CM | POA: Diagnosis not present

## 2017-12-12 DIAGNOSIS — Z86010 Personal history of colonic polyps: Secondary | ICD-10-CM | POA: Diagnosis not present

## 2017-12-12 DIAGNOSIS — K2210 Ulcer of esophagus without bleeding: Secondary | ICD-10-CM | POA: Diagnosis not present

## 2017-12-20 ENCOUNTER — Telehealth: Payer: Self-pay | Admitting: Student

## 2017-12-20 NOTE — Progress Notes (Signed)
Received refill authorization request for patient. Patient is currently taking Plavix 75 mg once daily.  Request form for Plavix 75 mg tablets taken once daily with 3 refills filled and faxed to [facility] [phone]).  Bea Graff Abednego Yeates, PA-C 12/20/2017, 1:49 PM

## 2018-01-08 ENCOUNTER — Ambulatory Visit (INDEPENDENT_AMBULATORY_CARE_PROVIDER_SITE_OTHER): Payer: PPO | Admitting: Family Medicine

## 2018-01-08 ENCOUNTER — Encounter (INDEPENDENT_AMBULATORY_CARE_PROVIDER_SITE_OTHER): Payer: Self-pay

## 2018-01-08 VITALS — BP 156/88 | HR 74 | Temp 97.9°F | Resp 10 | Ht 69.0 in | Wt 172.5 lb

## 2018-01-08 DIAGNOSIS — N400 Enlarged prostate without lower urinary tract symptoms: Secondary | ICD-10-CM | POA: Insufficient documentation

## 2018-01-08 DIAGNOSIS — H9202 Otalgia, left ear: Secondary | ICD-10-CM

## 2018-01-08 DIAGNOSIS — K2270 Barrett's esophagus without dysplasia: Secondary | ICD-10-CM | POA: Insufficient documentation

## 2018-01-08 DIAGNOSIS — I10 Essential (primary) hypertension: Secondary | ICD-10-CM | POA: Insufficient documentation

## 2018-01-08 DIAGNOSIS — E785 Hyperlipidemia, unspecified: Secondary | ICD-10-CM | POA: Diagnosis not present

## 2018-01-08 DIAGNOSIS — D649 Anemia, unspecified: Secondary | ICD-10-CM | POA: Insufficient documentation

## 2018-01-08 MED ORDER — LOSARTAN POTASSIUM 25 MG PO TABS: 25.0000 mg | ORAL_TABLET | Freq: Every day | ORAL | 2 refills | Status: DC

## 2018-01-08 MED ORDER — PANTOPRAZOLE SODIUM 40 MG PO TBEC: 40.0000 mg | DELAYED_RELEASE_TABLET | Freq: Every day | ORAL | 1 refills | Status: DC

## 2018-01-08 NOTE — Progress Notes (Signed)
Subjective:     John Salazar is a 72 y.o. male presenting for Establish Care (previous PCP Dr Becky Sax physicians); Hypertension (b/p fluctuates. discuss medication changes possibly.); and Ear Pain (left ear. Started 3 days ago-01/05/18.)     HPI  #HTN - was on irbesartan-HCTZ > prior to having the stent placed - then dropped to HCTZ alone (told this was good for menieres disease hx - carotid artery stent > then started having dizzy spells - the last week BP is going up - fatigued, flushed - Home monitoring > 120-150/80 - will get lightheaded if his BP is ~115 - currently taking HCTZ 25 mg -   #Ear Pain  - pain on the exterior and canal of the left ear - put some olive oil and heating pad and tylenol - no fever, chills - no drainage, itching - decreased hearing at baseline  - not worse with hearing  #GERD - hx of alcohol abuse until 1993 - quit smoking 1998 - started having stomach issues at 80 - continued for that time - hx of ulcers - sp treatment - has continued different medicaiton - last EGD was 4-5 years ago - follows with GI with plan for a EGD/Colonoscopy after he stops Plavix - on Plavix until June      Review of Systems No cp, sob Endorses intermittent dizziness No blood in urine or stool  Social History   Tobacco Use  Smoking Status Former Smoker  . Types: Cigarettes  . Last attempt to quit: 03/10/1996  . Years since quitting: 21.8  Smokeless Tobacco Never Used        Objective:    BP Readings from Last 3 Encounters:  01/08/18 (!) 156/88  08/24/17 133/80  08/09/17 120/82   Wt Readings from Last 3 Encounters:  01/08/18 172 lb 8 oz (78.2 kg)  08/23/17 171 lb 4.8 oz (77.7 kg)  08/09/17 170 lb (77.1 kg)    BP (!) 156/88   Pulse 74   Temp 97.9 F (36.6 C)   Resp 10   Ht 5\' 9"  (1.753 m)   Wt 172 lb 8 oz (78.2 kg)   SpO2 98%   BMI 25.47 kg/m    Physical Exam  Constitutional: He is oriented to person, place, and  time. He appears well-developed and well-nourished. No distress.  HENT:  Head: Normocephalic and atraumatic.  Right Ear: Tympanic membrane and ear canal normal.  Left Ear: Tympanic membrane and ear canal normal.  Nose: Nose normal.  Mouth/Throat: Uvula is midline and oropharynx is clear and moist.  Eyes: Pupils are equal, round, and reactive to light. Conjunctivae and EOM are normal. No scleral icterus.  Neck: Normal range of motion. Neck supple.  Cardiovascular: Normal rate, regular rhythm and normal heart sounds.  No murmur heard. Pulmonary/Chest: Effort normal and breath sounds normal. No respiratory distress. He has no wheezes.  Abdominal: Soft. Bowel sounds are normal. He exhibits no distension and no mass. There is no tenderness. There is no guarding.  Musculoskeletal: Normal range of motion.  Lymphadenopathy:    He has no cervical adenopathy.  Neurological: He is alert and oriented to person, place, and time.  Skin: Skin is warm and dry. Capillary refill takes less than 2 seconds. He is not diaphoretic.  Psychiatric: He has a normal mood and affect.          Assessment & Plan:   Problem List Items Addressed This Visit      Cardiovascular and  Mediastinum   Benign essential HTN - Primary    Discussed ways to decrease orthostatic symptoms. Also will start losartan given CKD and elevated BP today. Repeat labs in 4 weeks      Relevant Medications   hydrochlorothiazide (HYDRODIURIL) 25 MG tablet   rosuvastatin (CRESTOR) 20 MG tablet   losartan (COZAAR) 25 MG tablet     Digestive   Barrett esophagus    Cont pantoprazole and following with GI      Relevant Medications   pantoprazole (PROTONIX) 40 MG tablet     Other   HLD (hyperlipidemia)   Relevant Medications   hydrochlorothiazide (HYDRODIURIL) 25 MG tablet   rosuvastatin (CRESTOR) 20 MG tablet   losartan (COZAAR) 25 MG tablet   Anemia    Noted on labs when hospitalized for surgery. Will recheck at next lab  draw. No blood in stool       Other Visit Diagnoses    Left ear pain          No evidence of infection or trauma on exam. Reassurance and watch and wait for ear pain. Return if worsening/not improving   Return in about 4 weeks (around 02/05/2018) for blood pressure check.  Lesleigh Noe, MD

## 2018-01-08 NOTE — Patient Instructions (Addendum)
#  Hypertension - Start Losartan - keep an eye on blood pressure and symptoms - make sure you make slow transitions -- sit on the side of the bed - consider moving legs in bed before waking up in the morning - Return for blood pressure check and labs in 4 weeks

## 2018-01-08 NOTE — Assessment & Plan Note (Signed)
Cont pantoprazole and following with GI

## 2018-01-08 NOTE — Assessment & Plan Note (Signed)
Discussed ways to decrease orthostatic symptoms. Also will start losartan given CKD and elevated BP today. Repeat labs in 4 weeks

## 2018-01-08 NOTE — Assessment & Plan Note (Signed)
Noted on labs when hospitalized for surgery. Will recheck at next lab draw. No blood in stool

## 2018-01-15 ENCOUNTER — Other Ambulatory Visit: Payer: Self-pay

## 2018-01-15 NOTE — Patient Outreach (Signed)
[facility]) Care Management  01/15/2018  WILBUR OAKLAND Dec 22, 1945 [id number]   Telephone Screen  Referral Date: 01/15/18 Referral Source: Nurse Call Center Referral Reason: "  01/13/18-caller states he has itching and hemorrhoids" Insurance: HTA   Incoming call from patient returning RN CM call. Patient shares that through his cell phone carrier he has the automatic spam block of phone calls and it blocked the return calls from the nurse at nurse line center. RN CM reviewed and discussed reason for call to nurse line. Patient states that he spoke with "online MD" and got all his questions or concerns answered. He declined needing further assistance and voiced no further issues or concerns at this time. Patient advised to feel free to call 24hr Nurse Line for any future needs or concerns. He voiced understanding and was appreciative of call.     Plan: RN CM will close case at this time.    Enzo Montgomery, RN,BSN,CCM [place] [facility] Telephonic Care Management Coordinator Direct Phone: [phone] Toll Free: [phone] Fax: [phone]

## 2018-01-15 NOTE — Patient Outreach (Signed)
[facility]) Care Management  01/15/2018  OSVALDO LAMPING 15-Oct-1945 [id number]    Telephone Screen  Referral Date: 01/15/18 Referral Source: Nurse Call Center Referral Reason: "  01/13/18-caller states he has itching and hemorrhoids" Insurance: HTA   Outreach attempt #1 to patient and call went straight to [employer]. RN CM left HIPAA compliant voicemail message along with contact info.     Plan: RN CM will make outreach attempt to patient within 3-4 business days. RN CM will send unsuccessful outreach letter to patient.   Enzo Montgomery, RN,BSN,CCM [place] [facility] Telephonic Care Management Coordinator Direct Phone: [phone] Toll Free: [phone] Fax: [phone]

## 2018-01-16 ENCOUNTER — Encounter: Payer: Self-pay | Admitting: Gastroenterology

## 2018-01-16 ENCOUNTER — Ambulatory Visit: Payer: PPO

## 2018-01-22 ENCOUNTER — Encounter: Payer: Self-pay | Admitting: Internal Medicine

## 2018-01-22 ENCOUNTER — Ambulatory Visit: Payer: PPO | Admitting: Internal Medicine

## 2018-01-22 VITALS — BP 118/78 | HR 81 | Ht 69.0 in | Wt 171.4 lb

## 2018-01-22 DIAGNOSIS — Z7189 Other specified counseling: Secondary | ICD-10-CM | POA: Diagnosis not present

## 2018-01-22 DIAGNOSIS — I10 Essential (primary) hypertension: Secondary | ICD-10-CM

## 2018-01-22 DIAGNOSIS — E782 Mixed hyperlipidemia: Secondary | ICD-10-CM | POA: Diagnosis not present

## 2018-01-22 DIAGNOSIS — I6521 Occlusion and stenosis of right carotid artery: Secondary | ICD-10-CM

## 2018-01-22 NOTE — Patient Instructions (Signed)
Medication Instructions:  No changes If you need a refill on your cardiac medications before your next appointment, please call your pharmacy.   Lab work: None If you have labs (blood work) drawn today and your tests are completely normal, you will receive your results only by: Marland Kitchen MyChart Message (if you have MyChart) OR . A paper copy in the mail If you have any lab test that is abnormal or we need to change your treatment, we will call you to review the results.  Testing/Procedures: none  Follow-Up: At [facility], you and your health needs are our priority.  As part of our continuing mission to provide you with exceptional heart care, we have created designated Provider Care Teams.  These Care Teams include your primary Cardiologist (physician) and Advanced Practice Providers (APPs -  Physician Assistants and Nurse Practitioners) who all work together to provide you with the care you need, when you need it. You will need a follow up appointment in:  12 months.  Please call our office 2 months in advance to schedule this appointment.  You may see Dr. Harrington Challenger or one of the following Advanced Practice Providers on your designated Care Team: Richardson Dopp, PA-C [place], [place] . Daune Perch, NP  Any Other Special Instructions Will Be Listed Below (If Applicable).

## 2018-01-22 NOTE — Progress Notes (Signed)
Cardiology Office Note   Date:  01/22/2018   ID:  Felder, Lebeda 11-11-1945, MRN [id number]  PCP:  Lesleigh Noe, MD  Cardiologist:   Dorris Carnes, MD   Pt is self referred for cardiac risk analysis      History of Present Illness: John Salazar is a 72 y.o. male with a history of HTN, GERD, EtOH aabuse   CV dz  The pt developed dizziness in July 2019   Work up showed signif CV dz and he underatent PTA/Stent in July.    The dizziness has improved  H recently was seen by new primary MD   Concerned about coexistent CAD    He denies CP   Breathing is very good   He is very active  Lipids in October LDL 62  HDL 40   Trig 131    Current Meds  Medication Sig  . acetaminophen (TYLENOL) 500 MG tablet Take 1,000 mg by mouth every 6 (six) hours as needed for headache.   Marland Kitchen aspirin EC 81 MG tablet Take 81 mg by mouth daily.  . Calcium Carbonate Antacid (TUMS E-X PO) Take by mouth.  . clopidogrel (PLAVIX) 75 MG tablet   . dicyclomine (BENTYL) 20 MG tablet Take 20 mg by mouth daily.  Marland Kitchen losartan (COZAAR) 25 MG tablet Take 1 tablet (25 mg total) by mouth daily.  . pantoprazole (PROTONIX) 40 MG tablet Take 1 tablet (40 mg total) by mouth daily.  . Probiotic Product (PROBIOTIC-10 PO) Take 1 tablet by mouth daily.  . rosuvastatin (CRESTOR) 20 MG tablet   . Simethicone (GAS-X PO) Take by mouth.  . tamsulosin (FLOMAX) 0.4 MG CAPS capsule Take 0.4 mg by mouth every evening.      Allergies:   Bee venom and Penicillins   Past Medical History:  Diagnosis Date  . Anxiety   . BPH (benign prostatic hyperplasia)   . Chronic kidney disease    stage 2  . Colon polyps   . Erectile dysfunction   . GERD (gastroesophageal reflux disease)   . History of stomach ulcers   . Hypercholesteremia   . Hypertension   . 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 APPENDECTOMY  1970  . CATARACT EXTRACTION W/ INTRAOCULAR LENS  IMPLANT, BILATERAL      . COLONOSCOPY  10/2012  . ESOPHAGOGASTRODUODENOSCOPY ENDOSCOPY  10/2012  . HERNIA REPAIR    . IR ANGIO INTRA EXTRACRAN SEL COM CAROTID INNOMINATE BILAT MOD SED  08/09/2017  . IR ANGIO VERTEBRAL SEL VERTEBRAL BILAT MOD SED  08/09/2017  . IR INTRAVSC STENT CERV CAROTID W/EMB-PROT MOD SED INCL ANGIO  08/23/2017  . PROSTATE SURGERY  1999   20 years ago, enlarged prostate  . RADIOLOGY WITH ANESTHESIA N/A 08/23/2017   Procedure: IR WITH ANESTHESIA STENT PLACEMENT;  Surgeon: Luanne Bras, MD;  Location: [facility];  Service: Radiology;  Laterality: N/A;     Social History:  The patient  reports that he quit smoking about 21 years ago. His smoking use included cigarettes. He has never used smokeless tobacco. He reports that he does not drink alcohol or use drugs.   Family History:  The patient's family history includes Aneurysm (age of onset: 103) in his father; Stroke (age of onset: 73) in his mother.    ROS:  Please see the history of present illness. All other systems are reviewed and  Negative to the  above problem except as noted.    PHYSICAL EXAM: VS:  BP 118/78   Pulse 81   Ht 5\' 9"  (1.753 m)   Wt 171 lb 6.4 oz (77.7 kg)   SpO2 96%   BMI 25.31 kg/m   GEN: Well nourished, well developed, in no acute distress  HEENT: normal  Neck: no JVD, carotid bruits, or masses Cardiac: RRR; no murmurs, rubs, or gallops,no edema  Respiratory:  clear to auscultation bilaterally, normal work of breathing GI: soft, nontender, nondistended, + BS  No hepatomegaly  MS: no deformity Moving all extremities   Skin: warm and dry, no rash Neuro:  Strength and sensation are intact Psych: euthymic mood, full affect   EKG:  EKG is not ordered today.  In July 2019  SR 70 bpm  Nonspecific ST changes     Lipid Panel    Component Value Date/Time   CHOL 131 11/09/2017   TRIG 147 11/09/2017   HDL 40 11/09/2017   LDLCALC 62 11/09/2017      Wt Readings from Last 3 Encounters:  01/22/18 171 lb 6.4 oz (77.7  kg)  01/08/18 172 lb 8 oz (78.2 kg)  08/23/17 171 lb 4.8 oz (77.7 kg)      ASSESSMENT AND PLAN:  1 Cardiac risk assessment   The pt probably  has CAD given known CV disease    He is active and without symptoms No suggestion of angina  Therefore I would not plan any furhter testing for now unless develops symptoms    Treat as if has dz   Keep active     2  HTN  BP is OK  3  CV dz  Continue on ASA Plavix given recent CV intervention    4  Lipids   Good control   Continue to follow    WIll place f/u for 1 year,  Sooner if develops symptoms    I would set f/u in 1 year, sooner if pt develops problems     Current medicines are reviewed at length with the patient today.  The patient does not have concerns regarding medicines.  Signed, Dorris Carnes, MD  01/22/2018 2:11 PM    [facility] [place], [place], [place]  [address] Phone: [phone]; Fax: [phone]

## 2018-02-05 ENCOUNTER — Encounter: Payer: Self-pay | Admitting: Family Medicine

## 2018-02-05 ENCOUNTER — Ambulatory Visit (INDEPENDENT_AMBULATORY_CARE_PROVIDER_SITE_OTHER): Payer: PPO | Admitting: Family Medicine

## 2018-02-05 VITALS — BP 150/82 | HR 88 | Temp 98.1°F | Ht 69.0 in | Wt 172.2 lb

## 2018-02-05 DIAGNOSIS — N183 Chronic kidney disease, stage 3 unspecified: Secondary | ICD-10-CM | POA: Insufficient documentation

## 2018-02-05 DIAGNOSIS — Z1159 Encounter for screening for other viral diseases: Secondary | ICD-10-CM | POA: Diagnosis not present

## 2018-02-05 DIAGNOSIS — Z1211 Encounter for screening for malignant neoplasm of colon: Secondary | ICD-10-CM

## 2018-02-05 DIAGNOSIS — I10 Essential (primary) hypertension: Secondary | ICD-10-CM

## 2018-02-05 LAB — BASIC METABOLIC PANEL
BUN: 20 mg/dL (ref 6–23)
CHLORIDE: 106 meq/L (ref 96–112)
CO2: 28 meq/L (ref 19–32)
Calcium: 9.6 mg/dL (ref 8.4–10.5)
Creatinine, Ser: 1.55 mg/dL — ABNORMAL HIGH (ref 0.40–1.50)
GFR: 46.9 mL/min — ABNORMAL LOW (ref >60.00)
Glucose, Bld: 88 mg/dL (ref 70–99)
Potassium: 4.4 mEq/L (ref 3.5–5.1)
Sodium: 140 mEq/L (ref 135–145)

## 2018-02-05 MED ORDER — LOSARTAN POTASSIUM 50 MG PO TABS: 25.0000 mg | ORAL_TABLET | Freq: Every day | ORAL | 1 refills | Status: DC

## 2018-02-05 NOTE — Progress Notes (Signed)
Subjective:     John Salazar is a 72 y.o. male presenting for Hypertension (4 week follow up. )     HPI   #HTN - not checking the bp too often at home - has been running high at home 140-160s - took BP medication today   #colon cancer screening - has had polyps removed - last screen ~5 years ago - GI doctor wanted to wait until no longer on Plavix - GI doctor is talking with the provider who started the plavix for the stent to see if he can stop it  Review of Systems  Respiratory: Negative for shortness of breath.   Cardiovascular: Negative for chest pain, palpitations and leg swelling.  Neurological: Positive for headaches (caffeine).    01/08/2018: Clinic HTN - Added losartan. orthostatic prevention.  - recheck anemia labs 01/22/2018: Cards - CVD - asa and plavix given recent CV intervention. Lipids adn BP ok - 118/78   Social History   Tobacco Use  Smoking Status Former Smoker  . Types: Cigarettes  . Last attempt to quit: 03/10/1996  . Years since quitting: 21.9  Smokeless Tobacco Never Used        Objective:    BP Readings from Last 3 Encounters:  02/05/18 (!) 150/82  01/22/18 118/78  01/08/18 (!) 156/88   Wt Readings from Last 3 Encounters:  02/05/18 172 lb 4 oz (78.1 kg)  01/22/18 171 lb 6.4 oz (77.7 kg)  01/08/18 172 lb 8 oz (78.2 kg)    BP (!) 150/82   Pulse 88   Temp 98.1 F (36.7 C)   Ht 5\' 9"  (1.753 m)   Wt 172 lb 4 oz (78.1 kg)   SpO2 97%   BMI 25.44 kg/m    Physical Exam Constitutional:      Appearance: Normal appearance. He is not ill-appearing or diaphoretic.  HENT:     Right Ear: External ear normal.     Left Ear: External ear normal.     Nose: Nose normal.  Eyes:     General: No scleral icterus.    Extraocular Movements: Extraocular movements intact.     Conjunctiva/sclera: Conjunctivae normal.  Neck:     Musculoskeletal: Neck supple.     Vascular: No JVD.  Cardiovascular:     Rate and Rhythm: Normal rate and  regular rhythm.     Heart sounds: No murmur.  Pulmonary:     Effort: Pulmonary effort is normal.     Breath sounds: Normal breath sounds. No wheezing or rales.  Musculoskeletal:     Right lower leg: No edema.     Left lower leg: No edema.  Skin:    General: Skin is warm and dry.  Neurological:     Mental Status: He is alert. Mental status is at baseline.  Psychiatric:        Mood and Affect: Mood normal.           Assessment & Plan:   Problem List Items Addressed This Visit      Cardiovascular and Mediastinum   Benign essential HTN - Primary    BP elevated at home and in office. Will increase losartan. MyChart in 2 weeks with BP to see if another increase is needed.       Relevant Medications   losartan (COZAAR) 50 MG tablet   Other Relevant Orders   Basic metabolic panel     Other   Colon cancer screening    Follows with GI but  currently on Plavix. Last colonoscopy ~5 years ago. Discussed FOBT, but given blood thinner this may be positive. Will wait to see what GI hears back from provider who started the plavix 2/2 to stent. Likely OK to wait 6 mo - 1 year until no longer on plavix       Other Visit Diagnoses    Need for hepatitis C screening test       Relevant Orders   Hepatitis C antibody       Return in about 6 weeks (around 03/19/2018).  Lesleigh Noe, MD

## 2018-02-05 NOTE — Patient Instructions (Addendum)
BP Readings from Last 3 Encounters:  02/05/18 (!) 150/82  01/22/18 118/78  01/08/18 (!) 156/88    Blood pressure 1) Increase Losartan to 50 mg daily 2) In 2 weeks - send me a mychart message with your blood pressure. We will plan to increase the losartan if still elevated 3) Blood work before you leave - will send the results through MyChart   [place] stands for "Dietary Approaches to Stop Hypertension." The DASH eating plan is a healthy eating plan that has been shown to reduce high blood pressure (hypertension). It may also reduce your risk for type 2 diabetes, heart disease, and stroke. The DASH eating plan may also help with weight loss. What are tips for following this plan?  General guidelines  Avoid eating more than 2,300 mg (milligrams) of salt (sodium) a day. If you have hypertension, you may need to reduce your sodium intake to 1,500 mg a day.  Limit alcohol intake to no more than 1 drink a day for nonpregnant women and 2 drinks a day for men. One drink equals 12 oz of beer, 5 oz of wine, or 1 oz of hard liquor.  Work with your health care provider to maintain a healthy body weight or to lose weight. Ask what an ideal weight is for you.  Get at least 30 minutes of exercise that causes your heart to beat faster (aerobic exercise) most days of the week. Activities may include walking, swimming, or biking.  Work with your health care provider or diet and nutrition specialist (dietitian) to adjust your eating plan to your individual calorie needs. Reading food labels   Check food labels for the amount of sodium per serving. Choose foods with less than 5 percent of the Daily Value of sodium. Generally, foods with less than 300 mg of sodium per serving fit into this eating plan.  To find whole grains, look for the word "whole" as the first word in the ingredient list. Shopping  Buy products labeled as "low-sodium" or "no salt added."  Buy fresh foods. Avoid  canned foods and premade or frozen meals. Cooking  Avoid adding salt when cooking. Use salt-free seasonings or herbs instead of table salt or sea salt. Check with your health care provider or pharmacist before using salt substitutes.  Do not fry foods. Cook foods using healthy methods such as baking, boiling, grilling, and broiling instead.  Cook with heart-healthy oils, such as olive, canola, soybean, or sunflower oil. Meal planning  Eat a balanced diet that includes: ? 5 or more servings of fruits and vegetables each day. At each meal, try to fill half of your plate with fruits and vegetables. ? Up to 6-8 servings of whole grains each day. ? Less than 6 oz of lean meat, poultry, or fish each day. A 3-oz serving of meat is about the same size as a deck of cards. One egg equals 1 oz. ? 2 servings of low-fat dairy each day. ? A serving of nuts, seeds, or beans 5 times each week. ? Heart-healthy fats. Healthy fats called Omega-3 fatty acids are found in foods such as flaxseeds and coldwater fish, like sardines, salmon, and mackerel.  Limit how much you eat of the following: ? Canned or prepackaged foods. ? Food that is high in trans fat, such as fried foods. ? Food that is high in saturated fat, such as fatty meat. ? Sweets, desserts, sugary drinks, and other foods with added sugar. ? Full-fat dairy products.  Do not salt foods before eating.  Try to eat at least 2 vegetarian meals each week.  Eat more home-cooked food and less restaurant, buffet, and fast food.  When eating at a restaurant, ask that your food be prepared with less salt or no salt, if possible. What foods are recommended? The items listed may not be a complete list. Talk with your dietitian about what dietary choices are best for you. Grains Whole-grain or whole-wheat bread. Whole-grain or whole-wheat pasta. Brown rice. [place]. Bulgur. Whole-grain and low-sodium cereals. Pita bread. Low-fat, low-sodium  crackers. Whole-wheat flour tortillas. Vegetables Fresh or frozen vegetables (raw, steamed, roasted, or grilled). Low-sodium or reduced-sodium tomato and vegetable juice. Low-sodium or reduced-sodium tomato sauce and tomato paste. Low-sodium or reduced-sodium canned vegetables. Fruits All fresh, dried, or frozen fruit. Canned fruit in natural juice (without added sugar). Meat and other protein foods Skinless chicken or [place]. Ground chicken or [place]. Pork with fat trimmed off. Fish and seafood. Egg whites. Dried beans, peas, or lentils. Unsalted nuts, nut butters, and seeds. Unsalted canned beans. Lean cuts of beef with fat trimmed off. Low-sodium, lean deli meat. Dairy Low-fat (1%) or fat-free (skim) milk. Fat-free, low-fat, or reduced-fat cheeses. Nonfat, low-sodium ricotta or cottage cheese. Low-fat or nonfat yogurt. Low-fat, low-sodium cheese. Fats and oils Soft margarine without trans fats. Vegetable oil. Low-fat, reduced-fat, or light mayonnaise and salad dressings (reduced-sodium). Canola, safflower, olive, soybean, and sunflower oils. Avocado. Seasoning and other foods Herbs. Spices. Seasoning mixes without salt. Unsalted popcorn and pretzels. Fat-free sweets. What foods are not recommended? The items listed may not be a complete list. Talk with your dietitian about what dietary choices are best for you. Grains Baked goods made with fat, such as croissants, muffins, or some breads. Dry pasta or rice meal packs. Vegetables Creamed or fried vegetables. Vegetables in a cheese sauce. Regular canned vegetables (not low-sodium or reduced-sodium). Regular canned tomato sauce and paste (not low-sodium or reduced-sodium). Regular tomato and vegetable juice (not low-sodium or reduced-sodium). Angie Fava. Olives. Fruits Canned fruit in a light or heavy syrup. Fried fruit. Fruit in cream or butter sauce. Meat and other protein foods Fatty cuts of meat. Ribs. Fried meat. Berniece Salines. Sausage. Bologna and  other processed lunch meats. Salami. Fatback. Hotdogs. Bratwurst. Salted nuts and seeds. Canned beans with added salt. Canned or smoked fish. Whole eggs or egg yolks. Chicken or [place] with skin. Dairy Whole or 2% milk, cream, and half-and-half. Whole or full-fat cream cheese. Whole-fat or sweetened yogurt. Full-fat cheese. Nondairy creamers. Whipped toppings. Processed cheese and cheese spreads. Fats and oils Butter. Stick margarine. Lard. Shortening. Ghee. Bacon fat. Tropical oils, such as coconut, palm kernel, or palm oil. Seasoning and other foods Salted popcorn and pretzels. Onion salt, garlic salt, seasoned salt, table salt, and sea salt. Worcestershire sauce. Tartar sauce. Barbecue sauce. Teriyaki sauce. Soy sauce, including reduced-sodium. Steak sauce. Canned and packaged gravies. Fish sauce. Oyster sauce. Cocktail sauce. Horseradish that you find on the shelf. Ketchup. Mustard. Meat flavorings and tenderizers. Bouillon cubes. Hot sauce and Tabasco sauce. Premade or packaged marinades. Premade or packaged taco seasonings. Relishes. Regular salad dressings. Where to find more information:  National Heart, Lung, and [place]: https://wilson-eaton.com/  American Heart Association: www.heart.org Summary  The DASH eating plan is a healthy eating plan that has been shown to reduce high blood pressure (hypertension). It may also reduce your risk for type 2 diabetes, heart disease, and stroke.  With the DASH eating plan, you should limit salt (sodium)  intake to 2,300 mg a day. If you have hypertension, you may need to reduce your sodium intake to 1,500 mg a day.  When on the DASH eating plan, aim to eat more fresh fruits and vegetables, whole grains, lean proteins, low-fat dairy, and heart-healthy fats.  Work with your health care provider or diet and nutrition specialist (dietitian) to adjust your eating plan to your individual calorie needs. This information is not intended to replace advice  given to you by your health care provider. Make sure you discuss any questions you have with your health care provider. Document Released: 01/13/2011 Document Revised: 01/18/2016 Document Reviewed: 01/18/2016 Elsevier Interactive Patient Education  2019 [employer].

## 2018-02-05 NOTE — Assessment & Plan Note (Signed)
Follows with GI but currently on Plavix. Last colonoscopy ~5 years ago. Discussed FOBT, but given blood thinner this may be positive. Will wait to see what GI hears back from provider who started the plavix 2/2 to stent. Likely OK to wait 6 mo - 1 year until no longer on plavix

## 2018-02-05 NOTE — Assessment & Plan Note (Signed)
BP elevated at home and in office. Will increase losartan. MyChart in 2 weeks with BP to see if another increase is needed.

## 2018-02-06 ENCOUNTER — Other Ambulatory Visit: Payer: Self-pay

## 2018-02-06 DIAGNOSIS — I10 Essential (primary) hypertension: Secondary | ICD-10-CM

## 2018-02-06 LAB — HEPATITIS C ANTIBODY
Hepatitis C Ab: NONREACTIVE
SIGNAL TO CUT-OFF: 0.02 (ref <1.00)

## 2018-02-06 MED ORDER — LOSARTAN POTASSIUM 50 MG PO TABS: 50.0000 mg | ORAL_TABLET | Freq: Every day | ORAL | 1 refills | Status: DC

## 2018-02-06 NOTE — Telephone Encounter (Signed)
Re sending Losartan 50 mg to the pharmacy for 1 full tablet instead of 1/2 tablet. 1/2 tablet was sent in error. Dr. Einar Pheasant advised.

## 2018-02-20 ENCOUNTER — Other Ambulatory Visit (HOSPITAL_COMMUNITY): Payer: Self-pay | Admitting: Interventional Radiology

## 2018-02-20 DIAGNOSIS — I771 Stricture of artery: Secondary | ICD-10-CM

## 2018-02-26 ENCOUNTER — Telehealth: Payer: Self-pay

## 2018-02-26 DIAGNOSIS — I10 Essential (primary) hypertension: Secondary | ICD-10-CM

## 2018-02-26 MED ORDER — LOSARTAN POTASSIUM 100 MG PO TABS: 100.0000 mg | ORAL_TABLET | Freq: Every day | ORAL | 1 refills | Status: DC

## 2018-02-26 NOTE — Telephone Encounter (Signed)
1. Patient has been taking Losartan 50 mg 1 tablet daily except did not take it today due to taking his other b/p medication with HCTZ. 2. Patient is not having any symptoms of concern as listed below. Patient did have the ringing in his ears early this morning but that has improved now. He checked his b/p while we were on the phone and reading was 146/87. Patient will start Losartan 100 mg 1 daily tomorrow. 3. Patient will call back with readings since there is an issue with mychart messaging to Dr. Einar Pheasant. Will look into this.

## 2018-02-26 NOTE — Telephone Encounter (Signed)
Routing to MA to clarify the following:    1. What medications he was taking prior to elevated blood pressure  2. Is he having any additional symptoms: severe headache, chest pain, shortness of breath, etc --> if so may need to go the ER  3. Starting tomorrow: increase Losartan to 100 mg dail -- I've sent a prescription to the pharmacy.   4. Mychart or call back in 3-4 days with blood pressure numbers after increasing Losartan.

## 2018-02-26 NOTE — Telephone Encounter (Signed)
Pt said this morning when woke up ringing in ear which pt usually has but ringing in ear was more intense this morning; pt took BP 181/102; recked 170/90's. Pt did not take losartan 50 mg today but took irbesartan -HCTZ 150-12.5 mg instead; pt thought should take med with HCTZ in it since BP went up. No H/A,dizziness,CP, SOB or swelling. Pt states he feels OK.  Pt ate [place] food that was salty on 02/25/18.Pt has not been upset or rushing. So other than [place] food pt is not sure what would have caused elevated BP. Pt last seen 02/05/18 when losartan was increased to 50 mg. Recent BP readings 01/26/18 BP 153/89                 02/02/18 BP 151/90                 02/05/18 BP 147/97                  02/07/18 BP 136/81                  02/25/18 BP 151/87 Pt is at work now and cannot ck BP.Please advise. [employer].

## 2018-03-02 ENCOUNTER — Ambulatory Visit (HOSPITAL_COMMUNITY)
Admission: RE | Admit: 2018-03-02 | Discharge: 2018-03-02 | Disposition: A | Discharge status: Home / Self Care | Payer: PPO | Source: Ambulatory Visit | Attending: Interventional Radiology | Admitting: Interventional Radiology

## 2018-03-02 DIAGNOSIS — I771 Stricture of artery: Secondary | ICD-10-CM | POA: Diagnosis not present

## 2018-03-02 NOTE — Progress Notes (Signed)
Carotid artery duplex completed. Refer to "CV Proc" under chart review to view preliminary results.  03/02/2018 4:06 PM Maudry Mayhew, MHA, RVT, RDCS, RDMS

## 2018-03-04 ENCOUNTER — Encounter: Payer: Self-pay | Admitting: Family Medicine

## 2018-03-04 DIAGNOSIS — I10 Essential (primary) hypertension: Secondary | ICD-10-CM

## 2018-03-05 DIAGNOSIS — M25561 Pain in right knee: Secondary | ICD-10-CM | POA: Diagnosis not present

## 2018-03-05 DIAGNOSIS — M25562 Pain in left knee: Secondary | ICD-10-CM | POA: Diagnosis not present

## 2018-03-05 DIAGNOSIS — M170 Bilateral primary osteoarthritis of knee: Secondary | ICD-10-CM | POA: Diagnosis not present

## 2018-03-05 DIAGNOSIS — M179 Osteoarthritis of knee, unspecified: Secondary | ICD-10-CM | POA: Diagnosis not present

## 2018-03-06 ENCOUNTER — Other Ambulatory Visit (INDEPENDENT_AMBULATORY_CARE_PROVIDER_SITE_OTHER): Payer: PPO

## 2018-03-06 ENCOUNTER — Encounter: Payer: Self-pay | Admitting: Family Medicine

## 2018-03-06 ENCOUNTER — Telehealth (HOSPITAL_COMMUNITY): Payer: Self-pay

## 2018-03-06 DIAGNOSIS — I10 Essential (primary) hypertension: Secondary | ICD-10-CM | POA: Diagnosis not present

## 2018-03-06 LAB — BASIC METABOLIC PANEL
BUN: 25 mg/dL — ABNORMAL HIGH (ref 6–23)
CO2: 26 mEq/L (ref 19–32)
Calcium: 9.6 mg/dL (ref 8.4–10.5)
Chloride: 105 mEq/L (ref 96–112)
Creatinine, Ser: 1.65 mg/dL — ABNORMAL HIGH (ref 0.40–1.50)
GFR: 41.04 mL/min — ABNORMAL LOW (ref >60.00)
Glucose, Bld: 127 mg/dL — ABNORMAL HIGH (ref 70–99)
POTASSIUM: 4 meq/L (ref 3.5–5.1)
Sodium: 138 mEq/L (ref 135–145)

## 2018-03-06 MED ORDER — AMLODIPINE BESYLATE 5 MG PO TABS: 5.0000 mg | ORAL_TABLET | Freq: Every day | ORAL | 1 refills | Status: DC

## 2018-03-06 NOTE — Telephone Encounter (Signed)
Called pt regarding recent us carotid, no answer, left vm. AW 

## 2018-03-16 ENCOUNTER — Other Ambulatory Visit: Payer: Self-pay

## 2018-03-16 DIAGNOSIS — K2270 Barrett's esophagus without dysplasia: Secondary | ICD-10-CM

## 2018-03-16 MED ORDER — PANTOPRAZOLE SODIUM 40 MG PO TBEC: 40.0000 mg | DELAYED_RELEASE_TABLET | Freq: Every day | ORAL | 3 refills | Status: DC

## 2018-03-16 NOTE — Telephone Encounter (Signed)
Refill request from [facility] for Pantoprazole

## 2018-03-19 ENCOUNTER — Encounter: Payer: Self-pay | Admitting: Family Medicine

## 2018-03-19 ENCOUNTER — Ambulatory Visit (INDEPENDENT_AMBULATORY_CARE_PROVIDER_SITE_OTHER): Payer: PPO | Admitting: Family Medicine

## 2018-03-19 VITALS — BP 126/62 | HR 80 | Temp 98.6°F | Ht 69.0 in | Wt 168.5 lb

## 2018-03-19 DIAGNOSIS — R7303 Prediabetes: Secondary | ICD-10-CM | POA: Insufficient documentation

## 2018-03-19 DIAGNOSIS — N183 Chronic kidney disease, stage 3 unspecified: Secondary | ICD-10-CM

## 2018-03-19 DIAGNOSIS — I10 Essential (primary) hypertension: Secondary | ICD-10-CM

## 2018-03-19 DIAGNOSIS — E782 Mixed hyperlipidemia: Secondary | ICD-10-CM

## 2018-03-19 NOTE — Assessment & Plan Note (Signed)
Elevated 5.7 last year and high glucose on random testing. Repeat HgbA1c next month. Encouraged regular exercise to decrease risk of progression and choosing high protein and high fiber snacks

## 2018-03-19 NOTE — Assessment & Plan Note (Signed)
Reviewed labs. Encouraged hydration. Will check HgbA1c next month. Baseline 1.4-1.6. Will repeat to make sure not still elevating and if that is the case may consider stopping losartan and getting kidney US

## 2018-03-19 NOTE — Assessment & Plan Note (Signed)
BP improved in clinic. Suspect higher home numbers due to not resting prior to checking. Instructed on measuring technique and to MyChart if remaining elevated and will increase amlodipine if needed

## 2018-03-19 NOTE — Addendum Note (Signed)
Addended by: Kris Mouton on: 03/19/2018 10:37 AM   Modules accepted: Orders

## 2018-03-19 NOTE — Progress Notes (Signed)
Subjective:     John Salazar is a 73 y.o. male presenting for Hypertension (6 week follow up. No concerns with b/p medications. Also would like to go over his lab results.)     HPI  #HTN - at home still fluctuating 131-162/77-90 - is not typically resting when he takes it  - when it is high - feels a pressure on his face and ears ringinng - though occasionally his BP is not actually high - Losartan 100 mg and Amlodipine 5 mg  #CKD - worried about this diagnosis   Review of Systems  Eyes: Negative for visual disturbance.  Respiratory: Negative for shortness of breath.   Cardiovascular: Negative for chest pain, palpitations and leg swelling.  Neurological: Negative for headaches (maybe caffeine related).    02/05/2018: Clinic - HTN - Losartan 50 mg 02/26/2018: Telephone - increase to losartan 100 mg 03/04/2018: Mychart - BP 136-154/79-89 > add amlodipine    Social History   Tobacco Use  Smoking Status Former Smoker  . Types: Cigarettes  . Last attempt to quit: 03/10/1996  . Years since quitting: 22.0  Smokeless Tobacco Never Used        Objective:    BP Readings from Last 3 Encounters:  03/19/18 126/62  02/05/18 (!) 150/82  01/22/18 118/78   Wt Readings from Last 3 Encounters:  03/19/18 168 lb 8 oz (76.4 kg)  02/05/18 172 lb 4 oz (78.1 kg)  01/22/18 171 lb 6.4 oz (77.7 kg)    BP 126/62   Pulse 80   Temp 98.6 F (37 C)   Ht 5\' 9"  (1.753 m)   Wt 168 lb 8 oz (76.4 kg)   BMI 24.88 kg/m    Physical Exam Constitutional:      Appearance: Normal appearance. He is not ill-appearing or diaphoretic.  HENT:     Right Ear: External ear normal.     Left Ear: External ear normal.     Nose: Nose normal.  Eyes:     General: No scleral icterus.    Extraocular Movements: Extraocular movements intact.     Conjunctiva/sclera: Conjunctivae normal.  Neck:     Musculoskeletal: Neck supple.  Cardiovascular:     Rate and Rhythm: Normal rate and regular rhythm.       Heart sounds: No murmur.  Pulmonary:     Effort: Pulmonary effort is normal. No respiratory distress.     Breath sounds: Normal breath sounds. No wheezing.  Skin:    General: Skin is warm and dry.  Neurological:     Mental Status: He is alert. Mental status is at baseline.  Psychiatric:        Mood and Affect: Mood normal.    Lab Results  Component Value Date   HGBA1C 5.7 (H) 04/19/2017    BMP Latest Ref Rng & Units 03/06/2018 02/05/2018 11/08/2017  Glucose 70 - 99 mg/dL 127(H) 88 -  BUN 6 - 23 mg/dL 25(H) 20 16  Creatinine 0.40 - 1.50 mg/dL 1.65(H) 1.55(H) 1.5(A)  BUN/Creat Ratio 10 - 24 - - -  Sodium 135 - 145 mEq/L 138 140 142  Potassium 3.5 - 5.1 mEq/L 4.0 4.4 3.8  Chloride 96 - 112 mEq/L 105 106 -  CO2 19 - 32 mEq/L 26 28 -  Calcium 8.4 - 10.5 mg/dL 9.6 9.6 -         Assessment & Plan:   Problem List Items Addressed This Visit      Cardiovascular and Mediastinum  Benign essential HTN - Primary    BP improved in clinic. Suspect higher home numbers due to not resting prior to checking. Instructed on measuring technique and to MyChart if remaining elevated and will increase amlodipine if needed      Relevant Orders   Comprehensive metabolic panel   CBC     Genitourinary   CKD (chronic kidney disease) stage 3, GFR 30-59 ml/min (HCC)    Reviewed labs. Encouraged hydration. Will check HgbA1c next month. Baseline 1.4-1.6. Will repeat to make sure not still elevating and if that is the case may consider stopping losartan and getting kidney US        Other   HLD (hyperlipidemia)   Relevant Orders   Lipid panel   Prediabetes    Elevated 5.7 last year and high glucose on random testing. Repeat HgbA1c next month. Encouraged regular exercise to decrease risk of progression and choosing high protein and high fiber snacks      Relevant Orders   Hemoglobin A1c       Return in about 3 months (around 06/17/2018).  Lesleigh Noe, MD

## 2018-03-19 NOTE — Patient Instructions (Addendum)
#  Hypertension - Keep watching your blood pressure - continue Losartan 100 mg and the Amlodipine 5 mg - if blood pressure continues to be high   # Prediabetes - will repeat labs in 2-3 weeks - Exercise - try to get 150 minutes a week   #Chronic Kidney disease - we will repeat labs in a few weeks - controlling blood pressure is the best

## 2018-03-29 ENCOUNTER — Other Ambulatory Visit: Payer: Self-pay

## 2018-03-29 MED ORDER — ROSUVASTATIN CALCIUM 20 MG PO TABS: 20.0000 mg | ORAL_TABLET | Freq: Every day | ORAL | 12 refills | Status: DC

## 2018-03-29 NOTE — Telephone Encounter (Signed)
Refill request for Rosuvastatin from [facility].

## 2018-04-09 ENCOUNTER — Other Ambulatory Visit (INDEPENDENT_AMBULATORY_CARE_PROVIDER_SITE_OTHER): Payer: PPO

## 2018-04-09 DIAGNOSIS — R7303 Prediabetes: Secondary | ICD-10-CM | POA: Diagnosis not present

## 2018-04-09 DIAGNOSIS — E782 Mixed hyperlipidemia: Secondary | ICD-10-CM | POA: Diagnosis not present

## 2018-04-09 DIAGNOSIS — I10 Essential (primary) hypertension: Secondary | ICD-10-CM | POA: Diagnosis not present

## 2018-04-09 LAB — HEMOGLOBIN A1C: Hgb A1c MFr Bld: 5.8 % (ref 4.6–6.5)

## 2018-04-09 LAB — LIPID PANEL
Cholesterol: 136 mg/dL (ref 0–200)
HDL: 58.4 mg/dL (ref >39.00)
LDL Cholesterol: 55 mg/dL (ref 0–99)
NonHDL: 77.57
Total CHOL/HDL Ratio: 2
Triglycerides: 111 mg/dL (ref 0.0–149.0)
VLDL: 22.2 mg/dL (ref 0.0–40.0)

## 2018-04-09 LAB — COMPREHENSIVE METABOLIC PANEL
ALT: 17 U/L (ref 0–53)
AST: 19 U/L (ref 0–37)
Albumin: 4.6 g/dL (ref 3.5–5.2)
Alkaline Phosphatase: 75 U/L (ref 39–117)
BUN: 22 mg/dL (ref 6–23)
CO2: 29 mEq/L (ref 19–32)
Calcium: 10.1 mg/dL (ref 8.4–10.5)
Chloride: 104 mEq/L (ref 96–112)
Creatinine, Ser: 1.55 mg/dL — ABNORMAL HIGH (ref 0.40–1.50)
GFR: 44.1 mL/min — ABNORMAL LOW (ref >60.00)
Glucose, Bld: 88 mg/dL (ref 70–99)
POTASSIUM: 4.4 meq/L (ref 3.5–5.1)
Sodium: 140 mEq/L (ref 135–145)
TOTAL PROTEIN: 7.5 g/dL (ref 6.0–8.3)
Total Bilirubin: 0.8 mg/dL (ref 0.2–1.2)

## 2018-04-09 LAB — CBC
HCT: 44.2 % (ref 39.0–52.0)
Hemoglobin: 14.9 g/dL (ref 13.0–17.0)
MCHC: 33.7 g/dL (ref 30.0–36.0)
MCV: 90.2 fl (ref 78.0–100.0)
PLATELETS: 157 10*3/uL (ref 150.0–400.0)
RBC: 4.91 Mil/uL (ref 4.22–5.81)
RDW: 13.7 % (ref 11.5–15.5)
WBC: 4.8 10*3/uL (ref 4.0–10.5)

## 2018-04-13 ENCOUNTER — Other Ambulatory Visit: Payer: Self-pay | Admitting: Radiology

## 2018-04-13 MED ORDER — CLOPIDOGREL BISULFATE 75 MG PO TABS: 75.0000 mg | ORAL_TABLET | Freq: Every day | ORAL | 3 refills | Status: DC

## 2018-04-20 ENCOUNTER — Other Ambulatory Visit: Payer: Self-pay | Admitting: Family Medicine

## 2018-04-20 DIAGNOSIS — I10 Essential (primary) hypertension: Secondary | ICD-10-CM

## 2018-04-27 ENCOUNTER — Encounter: Payer: Self-pay | Admitting: Family Medicine

## 2018-04-30 NOTE — Telephone Encounter (Signed)
Spoke with patient today to follow up on how he is feeling and his b/p from 04/27/2018. I apologized for the delay in response and patient understood and was not upset. On 04/27/2018 patient was doing some house work and started to feel faint. States he has had this happen before when he was seen his previous PCP. Usually knows that means his b/p is low. He checked his b/p at11 am and it was 88/56. His previous PCP told him before that if that happens to him just to eat some salty crackers, that is what he did and his b/p went up to 108/62 around 12 pm and then 92/58 around 2 pm. As of 04/28/2018 he has not been taking his Amlodipine at all. B/P on 04/28/2018 with Losartan only was 128/84 and on 04/29/2018 105/70. Today-04/30/2018 he checked his b/p while on the phone with me and reading was 142/83, but he has not taking his Losartan yet. He is no longer feeling fait and denies any other cardiac symptoms or concerns. Only feels faint like that when b/p gets too low. He wonders what your thoughts are. Should he keep taking medications and adjusting them based on his b/p readings?

## 2018-05-09 ENCOUNTER — Encounter: Payer: Self-pay | Admitting: Family Medicine

## 2018-05-21 ENCOUNTER — Encounter: Payer: Self-pay | Admitting: Family Medicine

## 2018-05-21 DIAGNOSIS — R21 Rash and other nonspecific skin eruption: Secondary | ICD-10-CM

## 2018-05-21 MED ORDER — TRIAMCINOLONE ACETONIDE 0.1 % EX CREA: 1.0000 "application " | TOPICAL_CREAM | Freq: Two times a day (BID) | CUTANEOUS | 0 refills | Status: DC

## 2018-05-22 ENCOUNTER — Encounter: Payer: Self-pay | Admitting: Family Medicine

## 2018-05-22 ENCOUNTER — Other Ambulatory Visit: Payer: Self-pay

## 2018-05-22 ENCOUNTER — Ambulatory Visit (INDEPENDENT_AMBULATORY_CARE_PROVIDER_SITE_OTHER): Payer: PPO | Admitting: Family Medicine

## 2018-05-22 VITALS — BP 140/86 | HR 72 | Temp 97.8°F | Ht 69.0 in | Wt 168.0 lb

## 2018-05-22 DIAGNOSIS — I10 Essential (primary) hypertension: Secondary | ICD-10-CM

## 2018-05-22 DIAGNOSIS — R21 Rash and other nonspecific skin eruption: Secondary | ICD-10-CM

## 2018-05-22 DIAGNOSIS — W57XXXA Bitten or stung by nonvenomous insect and other nonvenomous arthropods, initial encounter: Secondary | ICD-10-CM | POA: Diagnosis not present

## 2018-05-22 DIAGNOSIS — S30863A Insect bite (nonvenomous) of scrotum and testes, initial encounter: Secondary | ICD-10-CM | POA: Diagnosis not present

## 2018-05-22 MED ORDER — DOXYCYCLINE HYCLATE 100 MG PO TABS: 100.0000 mg | ORAL_TABLET | Freq: Two times a day (BID) | ORAL | 0 refills | Status: DC

## 2018-05-22 NOTE — Progress Notes (Signed)
Subjective:     CADEN FUKUSHIMA is a 73 y.o. male presenting for Tick Bite (Pt removed a tick from his testicle 1 week ago. The area is red and swollen.)     HPI   #Tick bite - removed the tick 1 week ago - initially a small red lesion - took benadryl and a bath and was still itching - now with a larger rash and lesion present - was mowing when it started - just one spot on the testicle - not sure if he fully removed the tick but thinks he did - no other rashes on the body - itchy, burning symptoms - not pain - no swelling or abscess per patient - wondering if also a chigger bite   #HTN - bp was low and had stopped the amlodipine - now checks in the AM and then will occasionally only take 1/2 tablet or no losartan due to occasional low BP - no cp, sob, HA   Review of Systems  Constitutional: Negative for chills and fever.     Social History   Tobacco Use  Smoking Status Former Smoker  . Types: Cigarettes  . Last attempt to quit: 03/10/1996  . Years since quitting: 22.2  Smokeless Tobacco Never Used        Objective:    BP Readings from Last 3 Encounters:  05/22/18 140/86  03/19/18 126/62  02/05/18 (!) 150/82   Wt Readings from Last 3 Encounters:  05/22/18 168 lb (76.2 kg)  03/19/18 168 lb 8 oz (76.4 kg)  02/05/18 172 lb 4 oz (78.1 kg)    BP 140/86 (BP Location: Left Arm, Patient Position: Sitting, Cuff Size: Large)   Pulse 72   Temp 97.8 F (36.6 C) (Oral)   Ht 5\' 9"  (1.753 m)   Wt 168 lb (76.2 kg)   SpO2 98%   BMI 24.81 kg/m    Physical Exam Constitutional:      Appearance: Normal appearance. He is not ill-appearing or diaphoretic.  HENT:     Right Ear: External ear normal.     Left Ear: External ear normal.     Nose: Nose normal.  Eyes:     General: No scleral icterus.    Extraocular Movements: Extraocular movements intact.     Conjunctiva/sclera: Conjunctivae normal.  Neck:     Musculoskeletal: Neck supple.  Cardiovascular:   Rate and Rhythm: Normal rate.  Pulmonary:     Effort: Pulmonary effort is normal.  Genitourinary:    Comments: Left posterior scrotum with erythema which is covering the posterior portion of the scrotum. No mass, no tenderness, no swelling, diffuse erythema without clearing or distinct features.  Skin:    General: Skin is warm and dry.  Neurological:     Mental Status: He is alert. Mental status is at baseline.  Psychiatric:        Mood and Affect: Mood normal.           Assessment & Plan:   Problem List Items Addressed This Visit      Cardiovascular and Mediastinum   Benign essential HTN    BP seems to be low occasionally at home. Removed amlodipine as not needed. Advised decreasing to 50 mg losartan and checking later in the day. If elevated, return to original dose. If well controlled will update medication.        Other Visit Diagnoses    Skin rash    -  Primary   Relevant Medications  doxycycline (VIBRA-TABS) 100 MG tablet   Tick bite, initial encounter       Relevant Medications   doxycycline (VIBRA-TABS) 100 MG tablet     Tick bite - no evidence of tick still present. No abscess or sign of infection on exam.  ? Contact dermatitis or localized dermatitis > advised triamcinolone cream and benadryl prn for itching.   If no improvement or worsening symptoms > doxycycline provided as watch and wait medication   Return if symptoms worsen or fail to improve.  Lesleigh Noe, MD

## 2018-05-22 NOTE — Patient Instructions (Addendum)
#  Itching - continue Triamcinolone cream - 2 times daily - Benadryl can also be used  - if you develop fevers, chills, worsening symptoms call in    I will send in a prescription for antibiotics which you can start if the redness worsens or if no improvement in 5-10 days

## 2018-05-22 NOTE — Assessment & Plan Note (Signed)
BP seems to be low occasionally at home. Removed amlodipine as not needed. Advised decreasing to 50 mg losartan and checking later in the day. If elevated, return to original dose. If well controlled will update medication.

## 2018-05-23 ENCOUNTER — Encounter: Payer: Self-pay | Admitting: Family Medicine

## 2018-05-24 MED ORDER — HYDROXYZINE PAMOATE 25 MG PO CAPS: 25.0000 mg | ORAL_CAPSULE | Freq: Three times a day (TID) | ORAL | 0 refills | Status: DC | PRN

## 2018-06-17 ENCOUNTER — Encounter: Payer: Self-pay | Admitting: Family Medicine

## 2018-06-18 ENCOUNTER — Encounter: Payer: Self-pay | Admitting: Family Medicine

## 2018-06-18 ENCOUNTER — Ambulatory Visit (INDEPENDENT_AMBULATORY_CARE_PROVIDER_SITE_OTHER): Payer: PPO | Admitting: Family Medicine

## 2018-06-18 VITALS — BP 154/96 | HR 89 | Ht 69.0 in

## 2018-06-18 DIAGNOSIS — Z95828 Presence of other vascular implants and grafts: Secondary | ICD-10-CM

## 2018-06-18 DIAGNOSIS — Z7689 Persons encountering health services in other specified circumstances: Secondary | ICD-10-CM | POA: Insufficient documentation

## 2018-06-18 DIAGNOSIS — I10 Essential (primary) hypertension: Secondary | ICD-10-CM | POA: Diagnosis not present

## 2018-06-18 DIAGNOSIS — Z125 Encounter for screening for malignant neoplasm of prostate: Secondary | ICD-10-CM

## 2018-06-18 DIAGNOSIS — Z9889 Other specified postprocedural states: Secondary | ICD-10-CM | POA: Diagnosis not present

## 2018-06-18 NOTE — Assessment & Plan Note (Signed)
Discussed risks of prostate cancer screening. He would like to continue screening, but not due until October.

## 2018-06-18 NOTE — Assessment & Plan Note (Signed)
Unable to see in EMR if patient had outpatient follow-up after stent placement. Saw Cardiology in December but does not appear to have seen neurology. Has been on dual-anti-platelet x 11 months and likely does not need indefinitely, but feel it would be helpful to speak to a specialist for final recommendation. OK to hold for colonoscopy/Endoscopy -- pt will reach out to both specialty providers to get some clarity.

## 2018-06-18 NOTE — Assessment & Plan Note (Signed)
BP still slightly elevated. Discussed risk of MI/Stroke and pt would like to try to get better control. Will increase losartan to 100 mg > if getting symptoms of low bp will try 75 mg. If still getting symptoms will continue discussion of risk/benefit of bp control.

## 2018-06-18 NOTE — Progress Notes (Signed)
I connected with John Salazar on 06/18/18 at  8:00 AM EDT by video and verified that I am speaking with the correct person using two identifiers.   I discussed the limitations, risks, security and privacy concerns of performing an evaluation and management service by video and the availability of in person appointments. I also discussed with the patient that there may be a patient responsible charge related to this service. The patient expressed understanding and agreed to proceed.  Patient location: Home Provider Location: [place] Participants: Lesleigh Noe and John Salazar   Subjective:     John Salazar is a 73 y.o. male presenting for 3 months follow up (see mychart message for details.)     HPI   #BP - BP was high several months ago - when he was on Amlodpine and losartan BP finally decreased > but then had dizziness - If BP is <150 will take 50 mg if >150 then will take 100 mg - typically taking 50 mg dose.  - discussed risk of MI/Stroke with higher bp - pt would like to trial 100 mg losartan to see if he can tolerate - check back with mychart in 1 week - doing well overall - no cp, sob, dizziness  #PSA  - has been getting his PSA checked - was up as high as 3.24 - several friends with prostate cancer - discussed risks of screening. Ok with continued annual screening  #s/p carotid artery stent - placed 08/2017 - continues to be on ASA and plavix - wondering how long he needs to be on these medications - has not seen the providers who did the stents for a while - guideline dosing is not clear - may only needed ASA at this point.     Review of Systems  Constitutional: Negative for chills and fever.  Respiratory: Negative for shortness of breath.   Cardiovascular: Negative for chest pain.  Neurological: Negative for dizziness.    03/19/2018: Clinic - HTN - mildly elevated but better. CKD - stable. prediabetes 04/27/2018: MyChart - feeling  faint with low BP 04/17/2018: MyChart - BP at home 144-151/88-95 on Losartan 50 mg.   Social History   Tobacco Use  Smoking Status Former Smoker  . Types: Cigarettes  . Last attempt to quit: 03/10/1996  . Years since quitting: 22.2  Smokeless Tobacco Never Used        Objective:   BP Readings from Last 3 Encounters:  06/18/18 (!) 154/96  05/22/18 140/86  03/19/18 126/62   Wt Readings from Last 3 Encounters:  05/22/18 168 lb (76.2 kg)  03/19/18 168 lb 8 oz (76.4 kg)  02/05/18 172 lb 4 oz (78.1 kg)    BP (!) 154/96 Comment: per patient  Pulse 89 Comment: per patient  Ht 5\' 9"  (1.753 m)   BMI 24.81 kg/m    Physical Exam Constitutional:      Appearance: Normal appearance. He is not ill-appearing.  HENT:     Head: Normocephalic and atraumatic.     Right Ear: External ear normal.     Left Ear: External ear normal.  Eyes:     Conjunctiva/sclera: Conjunctivae normal.  Cardiovascular:     Rate and Rhythm: Normal rate.  Pulmonary:     Effort: Pulmonary effort is normal. No respiratory distress.  Neurological:     Mental Status: He is alert. Mental status is at baseline.  Psychiatric:        Mood and  Affect: Mood normal.        Behavior: Behavior normal.        Thought Content: Thought content normal.        Judgment: Judgment normal.            Assessment & Plan:   Problem List Items Addressed This Visit      Cardiovascular and Mediastinum   Benign essential HTN - Primary    BP still slightly elevated. Discussed risk of MI/Stroke and pt would like to try to get better control. Will increase losartan to 100 mg > if getting symptoms of low bp will try 75 mg. If still getting symptoms will continue discussion of risk/benefit of bp control.         Other   History of right common carotid artery stent placement    Unable to see in EMR if patient had outpatient follow-up after stent placement. Saw Cardiology in December but does not appear to have seen  neurology. Has been on dual-anti-platelet x 11 months and likely does not need indefinitely, but feel it would be helpful to speak to a specialist for final recommendation. OK to hold for colonoscopy/Endoscopy -- pt will reach out to both specialty providers to get some clarity.       Screening for prostate cancer    Discussed risks of prostate cancer screening. He would like to continue screening, but not due until October.           Return in about 6 months (around 12/19/2018).  Lesleigh Noe, MD

## 2018-07-15 ENCOUNTER — Other Ambulatory Visit: Payer: Self-pay | Admitting: Family Medicine

## 2018-07-15 DIAGNOSIS — K2270 Barrett's esophagus without dysplasia: Secondary | ICD-10-CM

## 2018-08-15 ENCOUNTER — Encounter: Payer: Self-pay | Admitting: Family Medicine

## 2018-08-16 NOTE — Telephone Encounter (Signed)
Pt called on 08/15/18 asking for a medicare wellness appointment. I scheduled him a 40 min appointment on 08/23/18. If this is not correct please let me know.

## 2018-08-20 ENCOUNTER — Telehealth: Payer: Self-pay | Admitting: Student

## 2018-08-20 NOTE — Telephone Encounter (Signed)
NIR.  Received message from patient requesting Plavix refill. Patient is currently taking Plavix 75 mg once daily.  [facility] [phone]) at 1303 to fill prescription- Plavix 75 mg tablets, take one tablet by mouth once daily, dispense 30 tablets with 3 refills.   Bea Graff Paisyn Guercio, PA-C 08/20/2018, 1:06 PM

## 2018-08-23 ENCOUNTER — Other Ambulatory Visit: Payer: Self-pay

## 2018-08-23 ENCOUNTER — Encounter: Payer: Self-pay | Admitting: Family Medicine

## 2018-08-23 ENCOUNTER — Ambulatory Visit (INDEPENDENT_AMBULATORY_CARE_PROVIDER_SITE_OTHER): Payer: PPO | Admitting: Family Medicine

## 2018-08-23 VITALS — BP 136/82 | HR 92 | Temp 98.4°F | Resp 18 | Ht 68.75 in | Wt 166.5 lb

## 2018-08-23 DIAGNOSIS — Z66 Do not resuscitate: Secondary | ICD-10-CM | POA: Insufficient documentation

## 2018-08-23 DIAGNOSIS — Z9849 Cataract extraction status, unspecified eye: Secondary | ICD-10-CM | POA: Diagnosis not present

## 2018-08-23 DIAGNOSIS — Z0000 Encounter for general adult medical examination without abnormal findings: Secondary | ICD-10-CM

## 2018-08-23 NOTE — Patient Instructions (Signed)
Great to see you!  We will plan to get labs next spring  Get your colonoscopy  Look into getting the shingles shot a pharmacy  Return in the fall for a nurse visit to get your flu shot.   Keep up the good work with diet and exercise!

## 2018-08-23 NOTE — Progress Notes (Signed)
Subjective:   John Salazar is a 73 y.o. male who presents for Medicare Annual/Subsequent preventive examination.  Review of Systems:  Review of Systems  Constitutional: Negative for chills and fever.  HENT: Negative for congestion and sore throat.   Eyes: Negative for blurred vision and double vision.  Respiratory: Negative for shortness of breath.   Cardiovascular: Negative for chest pain.  Gastrointestinal: Negative for heartburn, nausea and vomiting.  Genitourinary: Negative.   Musculoskeletal: Negative.  Negative for myalgias.  Skin: Negative for rash.  Neurological: Negative for dizziness and headaches.  Endo/Heme/Allergies: Does not bruise/bleed easily.  Psychiatric/Behavioral: Negative for depression. The patient is not nervous/anxious.    Seeing the orthopedic doctor for knee pain - has used prednisone prescription - has had injection         Objective:    Vitals: BP 136/82   Pulse 92   Temp 98.4 F (36.9 C)   Resp 18   Ht 5' 8.75" (1.746 m)   Wt 166 lb 8 oz (75.5 kg)   SpO2 100%   BMI 24.77 kg/m   Body mass index is 24.77 kg/m.  Advanced Directives 08/23/2017 08/09/2017  Does Patient Have a Medical Advance Directive? Yes No  Type of Advance Directive Living will -  Does patient want to make changes to medical advance directive? No - Patient declined -  Would patient like information on creating a medical advance directive? No - Patient declined No - Patient declined    Tobacco Social History   Tobacco Use  Smoking Status Former Smoker  . Packs/day: 2.00  . Years: 25.00  . Pack years: 50.00  . Types: Cigarettes  . Quit date: 03/10/1996  . Years since quitting: 22.4  Smokeless Tobacco Never Used     Counseling given: Not Answered   Clinical Intake: See scanned form    Past Medical History:  Diagnosis Date  . Anxiety   . BPH (benign prostatic hyperplasia)   . Chronic kidney disease    stage 2  . Colon polyps   . Erectile dysfunction    . GERD (gastroesophageal reflux disease)   . History of stomach ulcers   . Hypercholesteremia   . Hypertension   . 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 APPENDECTOMY  1970  . CATARACT EXTRACTION W/ INTRAOCULAR LENS  IMPLANT, BILATERAL    . COLONOSCOPY  10/2012  . ESOPHAGOGASTRODUODENOSCOPY ENDOSCOPY  10/2012  . HERNIA REPAIR    . IR ANGIO INTRA EXTRACRAN SEL COM CAROTID INNOMINATE BILAT MOD SED  08/09/2017  . IR ANGIO VERTEBRAL SEL VERTEBRAL BILAT MOD SED  08/09/2017  . IR INTRAVSC STENT CERV CAROTID W/EMB-PROT MOD SED INCL ANGIO  08/23/2017  . PROSTATE SURGERY  1999   20 years ago, enlarged prostate  . RADIOLOGY WITH ANESTHESIA N/A 08/23/2017   Procedure: IR WITH ANESTHESIA STENT PLACEMENT;  Surgeon: Luanne Bras, MD;  Location: [facility];  Service: Radiology;  Laterality: N/A;   Family History  Problem Relation Age of Onset  . Stroke Mother 50  . Aneurysm Father 49   Social History   Socioeconomic History  . Marital status: Married    Spouse name: pamela  . Number of children: 2  . Years of education: 23  . Highest education level: Not on file  Occupational History  . Occupation: Oceanographer.  Social Needs  . Financial resource strain: Not on file  . Food  insecurity    Worry: Not on file    Inability: Not on file  . Transportation needs    Medical: Not on file    Non-medical: Not on file  Tobacco Use  . Smoking status: Former Smoker    Packs/day: 2.00    Years: 25.00    Pack years: 50.00    Types: Cigarettes    Quit date: 03/10/1996    Years since quitting: 22.4  . Smokeless tobacco: Never Used  Substance and Sexual Activity  . Alcohol use: No    Alcohol/week: 0.0 standard drinks    Comment: in the past drank, quit 1993  . Drug use: No  . Sexual activity: Not Currently  Lifestyle  . Physical activity    Days per week: Not on file    Minutes per session: Not on file  . Stress: Not on file   Relationships  . Social Herbalist on phone: Not on file    Gets together: Not on file    Attends religious service: Not on file    Active member of club or organization: Not on file    Attends meetings of clubs or organizations: Not on file    Relationship status: Not on file  Other Topics Concern  . Not on file  Social History Narrative   Lives with [name] (Wife) - together for 25 years   Children - [name] and [name] -- one is a Marine scientist in [employer] and other works for [employer] - 18, lives in [place]   Enjoys - playing golf, goes to [employer] and sponsors people as well, church   Support - family, good friends in [place]   Exercise - walks the dog, runs up hills   Diet - improved from before, tries to drink water - low salt or no salt    Outpatient Encounter Medications as of 08/23/2018  Medication Sig  . acetaminophen (TYLENOL) 500 MG tablet Take 1,000 mg by mouth every 6 (six) hours as needed for headache.   Marland Kitchen aspirin EC 81 MG tablet Take 81 mg by mouth daily.  . Calcium Carbonate Antacid (TUMS E-X PO) Take by mouth.  . clopidogrel (PLAVIX) 75 MG tablet Take 1 tablet (75 mg total) by mouth daily.  Marland Kitchen losartan (COZAAR) 100 MG tablet TAKE 1 TABLET ONCE DAILY. (Patient taking differently: Taking 50 mg daily right now)  . pantoprazole (PROTONIX) 40 MG tablet TAKE 1 TABLET BY MOUTH DAILY.  . rosuvastatin (CRESTOR) 20 MG tablet Take 1 tablet (20 mg total) by mouth daily.  . Simethicone (GAS-X PO) Take by mouth.  . tamsulosin (FLOMAX) 0.4 MG CAPS capsule Take 0.4 mg by mouth every evening.   . [DISCONTINUED] dicyclomine (BENTYL) 20 MG tablet Take 20 mg by mouth daily.   No facility-administered encounter medications on file as of 08/23/2018.     Activities of Daily Living In your present state of health, do you have any difficulty performing the following activities: 08/23/2017 08/23/2017  Hearing? N N  Vision? N N  Difficulty concentrating or making decisions? N N   Walking or climbing stairs? N N  Dressing or bathing? N N  Doing errands, shopping? N -  Some recent data might be hidden   Has hearing aides Knee pain limits walking occasionally Otherwise is able to do daily activities   Patient Care Team: Lesleigh Noe, MD as PCP - General (Family Medicine) Fay Records, MD as PCP - Cardiology (  Cardiology)   Assessment:   This is a routine wellness examination for [place].  Exercise Activities and Dietary recommendations   Walking 3+ days a week Diet: pretty good - oatmeal for breakfast, light lunch (avoiding bread), grilled food for dinner  Goals   None     Fall Risk No flowsheet data found. Is the patient's home free of loose throw rugs in walkways, pet beds, electrical cords, etc?   no      Grab bars in the bathroom? no      Handrails on the stairs?   yes      Adequate lighting?   yes  Timed Get Up and Go Performed: n/a   Depression Screen PHQ 2/9 Scores 01/08/2018  PHQ - 2 Score 0    Cognitive Function    Mini-Cog - 08/23/18 1116    Normal clock drawing test?  yes    How many words correct?  3        Immunization History  Administered Date(s) Administered  . Influenza, High Dose Seasonal PF 11/07/2017  . Pneumococcal Conjugate-13 07/26/2013  . Pneumococcal Polysaccharide-23 11/19/2009  . Tdap 11/12/2010    Qualifies for Shingles Vaccine? Yes - advised pharmacy  Screening Tests Health Maintenance  Topic Date Due  . COLONOSCOPY  10/15/2017  . INFLUENZA VACCINE  09/08/2018  . TETANUS/TDAP  11/11/2020  . Hepatitis C Screening  Completed  . PNA vac Low Risk Adult  Completed   Cancer Screenings: Lung: Low Dose CT Chest recommended if Age 59-80 years, 30 pack-year currently smoking OR have quit w/in 15years. Patient does not qualify. Colorectal: will get colonoscopy soon, already scheduled  Additional Screenings:  Hepatitis C Screening: already done      Plan:    Labs last done in March. Will defer PSA  check until next year to group annual labs together.   Pt would like to find a new ophthalmologist - referral placed  I have personally reviewed and noted the following in the patient's chart:   . Medical and social history  . Use of alcohol, tobacco or illicit drugs  . Current medications and supplements . Functional ability and status . Nutritional status . Physical activity . Advanced directives . List of other physicians . Hospitalizations, surgeries, and ER visits in previous 12 months . Vitals . Screenings to include cognitive, depression, and falls . Referrals and appointments  In addition, I have reviewed and discussed with patient certain preventive protocols, quality metrics, and best practice recommendations. A written personalized care plan for preventive services as well as general preventive health recommendations were provided to patient.     Lesleigh Noe, MD  08/23/2018

## 2018-09-07 DIAGNOSIS — Z1159 Encounter for screening for other viral diseases: Secondary | ICD-10-CM | POA: Diagnosis not present

## 2018-09-12 DIAGNOSIS — K449 Diaphragmatic hernia without obstruction or gangrene: Secondary | ICD-10-CM | POA: Diagnosis not present

## 2018-09-12 DIAGNOSIS — D128 Benign neoplasm of rectum: Secondary | ICD-10-CM | POA: Diagnosis not present

## 2018-09-12 DIAGNOSIS — K635 Polyp of colon: Secondary | ICD-10-CM | POA: Diagnosis not present

## 2018-09-12 DIAGNOSIS — Z86010 Personal history of colonic polyps: Secondary | ICD-10-CM | POA: Diagnosis not present

## 2018-09-12 DIAGNOSIS — K2270 Barrett's esophagus without dysplasia: Secondary | ICD-10-CM | POA: Diagnosis not present

## 2018-09-12 DIAGNOSIS — D123 Benign neoplasm of transverse colon: Secondary | ICD-10-CM | POA: Diagnosis not present

## 2018-09-12 DIAGNOSIS — K5730 Diverticulosis of large intestine without perforation or abscess without bleeding: Secondary | ICD-10-CM | POA: Diagnosis not present

## 2018-09-12 DIAGNOSIS — D125 Benign neoplasm of sigmoid colon: Secondary | ICD-10-CM | POA: Diagnosis not present

## 2018-09-12 LAB — HM COLONOSCOPY

## 2018-09-14 DIAGNOSIS — D125 Benign neoplasm of sigmoid colon: Secondary | ICD-10-CM | POA: Diagnosis not present

## 2018-09-14 DIAGNOSIS — D128 Benign neoplasm of rectum: Secondary | ICD-10-CM | POA: Diagnosis not present

## 2018-09-14 DIAGNOSIS — D123 Benign neoplasm of transverse colon: Secondary | ICD-10-CM | POA: Diagnosis not present

## 2018-09-14 DIAGNOSIS — K2270 Barrett's esophagus without dysplasia: Secondary | ICD-10-CM | POA: Diagnosis not present

## 2018-09-14 DIAGNOSIS — K635 Polyp of colon: Secondary | ICD-10-CM | POA: Diagnosis not present

## 2018-09-19 ENCOUNTER — Other Ambulatory Visit (HOSPITAL_COMMUNITY): Payer: Self-pay | Admitting: Interventional Radiology

## 2018-09-19 DIAGNOSIS — I771 Stricture of artery: Secondary | ICD-10-CM

## 2018-10-02 DIAGNOSIS — H4912 Fourth [trochlear] nerve palsy, left eye: Secondary | ICD-10-CM | POA: Diagnosis not present

## 2018-10-03 ENCOUNTER — Encounter: Payer: Self-pay | Admitting: Family Medicine

## 2018-10-04 ENCOUNTER — Telehealth (HOSPITAL_COMMUNITY): Payer: Self-pay

## 2018-10-04 ENCOUNTER — Ambulatory Visit (INDEPENDENT_AMBULATORY_CARE_PROVIDER_SITE_OTHER): Payer: PPO | Admitting: Family Medicine

## 2018-10-04 ENCOUNTER — Ambulatory Visit (HOSPITAL_COMMUNITY)
Admission: RE | Admit: 2018-10-04 | Discharge: 2018-10-04 | Disposition: A | Discharge status: Home / Self Care | Payer: PPO | Source: Ambulatory Visit | Attending: Interventional Radiology | Admitting: Interventional Radiology

## 2018-10-04 ENCOUNTER — Other Ambulatory Visit: Payer: Self-pay

## 2018-10-04 ENCOUNTER — Encounter: Payer: Self-pay | Admitting: Family Medicine

## 2018-10-04 VITALS — BP 140/86 | HR 74 | Temp 98.6°F | Ht 68.75 in | Wt 168.2 lb

## 2018-10-04 DIAGNOSIS — I771 Stricture of artery: Secondary | ICD-10-CM | POA: Diagnosis not present

## 2018-10-04 DIAGNOSIS — Z23 Encounter for immunization: Secondary | ICD-10-CM

## 2018-10-04 DIAGNOSIS — M170 Bilateral primary osteoarthritis of knee: Secondary | ICD-10-CM | POA: Diagnosis not present

## 2018-10-04 MED ORDER — METHYLPREDNISOLONE ACETATE 40 MG/ML IJ SUSP
80.0000 mg | Freq: Once | INTRAMUSCULAR | Status: AC
  Administered 2018-10-04: 13:00:00 80 mg via INTRA_ARTICULAR

## 2018-10-04 MED ORDER — METHYLPREDNISOLONE ACETATE 40 MG/ML IJ SUSP
80.0000 mg | Freq: Once | INTRAMUSCULAR | Status: AC
  Administered 2018-10-04: 80 mg via INTRA_ARTICULAR

## 2018-10-04 NOTE — Progress Notes (Signed)
     Cambell Stanek T. Madox Corkins, MD Primary Care and Sports Medicine [facility] at [facility] [place], [address] Phone: [phone]  FAX: [name] - 73 y.o. male  MRN [id number]  Date of Birth: 11/14/1945  Visit Date: 10/04/2018  PCP: Lesleigh Noe, MD  Referred by: Lesleigh Noe, MD  Chief Complaint  Patient presents with  . Knee Pain    Bilateral   Subjective:   John Salazar is a 73 y.o. very pleasant male patient with Body mass index is 25.03 kg/m. who presents with the following:  B knee OA and pain, presents with request for corticosteroid injections. He has known Knee OA and has been treated in the past by Erasmo Score at [facility].  Procedure only:  Aspiration/Injection Procedure Note John Salazar [birth year] Date of procedure: 10/04/2018  Procedure: Large Joint Joint Aspiration / Injection of the Right Knee Indications: Pain  Procedure Details Patient verbally consented to procedure. Risks (including potential rare risk of infection), benefits, and alternatives explained. Sterilely prepped with Chloraprep. Ethyl cholride used for anesthesia. 8 cc Lidocaine 1% mixed with 2 mL Depo-Medrol 40 mg injected using the anteromedial approach without difficulty. No complications with procedure and tolerated well. Patient had decreased pain post-injection.  Medication: 2 mL of Depo-Medrol 40 mg, equaling Depo-Medrol 80 mg total  Aspiration/Injection Procedure Note John Salazar 29-Apr-1945 Date of procedure: 10/04/2018  Procedure: Large Joint Aspiration / Injection of the Left Knee Indications: Pain  Procedure Details Patient verbally consented to procedure. Risks (including potential rare risk of infection), benefits, and alternatives explained. Sterilely prepped with Chloraprep. Ethyl cholride used for anesthesia. 8 cc Lidocaine 1% mixed with 2 mL Depo-Medrol 40 mg injected using the anteromedial approach  without difficulty. No complications with procedure and tolerated well. Patient had decreased pain post-injection.  Medication: 2 mL of Depo-Medrol 40 mg, equaling Depo-Medrol 80 mg total   Signed,  Dianca Owensby T. Khalil Szczepanik, MD

## 2018-10-04 NOTE — Telephone Encounter (Signed)
Pt agreed to f/u in 6 months with us carotid. AW 

## 2018-10-05 NOTE — Progress Notes (Signed)
Please, send this message to Dr. Luanne Bras. He is not my patient.

## 2018-10-08 ENCOUNTER — Ambulatory Visit: Payer: PPO | Admitting: Family Medicine

## 2018-10-09 ENCOUNTER — Encounter: Payer: Self-pay | Admitting: Primary Care

## 2018-10-22 ENCOUNTER — Telehealth: Payer: Self-pay | Admitting: Student

## 2018-10-22 NOTE — Telephone Encounter (Signed)
NIR.  Received message from patient regarding questions, asking for me to call back.  Called patient at [phone] to speak with him. Requesting that I go over carotid US results from 10/04/2018- reviewed results over phone (stent is patent).  Discussed follow-up regarding his stent and aneurysm- explained we will use two different imaging modalities for this, and our schedulers will call him to set up these imaging scans. Discussed continuation of DAPT (Plavix and Aspirin)- explained that he is to continue DAPT at this time per Dr. Estanislado Pandy. All questions answered and concerns addressed. Patient conveys understanding and agrees with plan.   Bea Graff Louk, PA-C 10/22/2018, 10:35 AM

## 2018-10-24 ENCOUNTER — Telehealth (HOSPITAL_COMMUNITY): Payer: Self-pay

## 2018-10-24 NOTE — Telephone Encounter (Signed)
Called to schedule angio, no answer, left vm. AW  

## 2018-10-29 ENCOUNTER — Other Ambulatory Visit (HOSPITAL_COMMUNITY): Payer: Self-pay | Admitting: Interventional Radiology

## 2018-10-29 DIAGNOSIS — I771 Stricture of artery: Secondary | ICD-10-CM

## 2018-10-29 DIAGNOSIS — I729 Aneurysm of unspecified site: Secondary | ICD-10-CM

## 2018-11-07 ENCOUNTER — Other Ambulatory Visit: Payer: Self-pay | Admitting: Student

## 2018-11-07 ENCOUNTER — Other Ambulatory Visit: Payer: Self-pay | Admitting: Radiology

## 2018-11-08 ENCOUNTER — Other Ambulatory Visit (HOSPITAL_COMMUNITY): Payer: Self-pay | Admitting: Interventional Radiology

## 2018-11-08 ENCOUNTER — Encounter (HOSPITAL_COMMUNITY): Payer: Self-pay

## 2018-11-08 ENCOUNTER — Ambulatory Visit (HOSPITAL_COMMUNITY)
Admission: RE | Admit: 2018-11-08 | Discharge: 2018-11-08 | Disposition: A | Discharge status: Home / Self Care | Payer: PPO | Source: Ambulatory Visit | Attending: Interventional Radiology | Admitting: Interventional Radiology

## 2018-11-08 ENCOUNTER — Telehealth (HOSPITAL_COMMUNITY): Payer: Self-pay

## 2018-11-08 ENCOUNTER — Other Ambulatory Visit: Payer: Self-pay

## 2018-11-08 DIAGNOSIS — I129 Hypertensive chronic kidney disease with stage 1 through stage 4 chronic kidney disease, or unspecified chronic kidney disease: Secondary | ICD-10-CM | POA: Insufficient documentation

## 2018-11-08 DIAGNOSIS — I671 Cerebral aneurysm, nonruptured: Secondary | ICD-10-CM | POA: Insufficient documentation

## 2018-11-08 DIAGNOSIS — E7800 Pure hypercholesterolemia, unspecified: Secondary | ICD-10-CM | POA: Diagnosis not present

## 2018-11-08 DIAGNOSIS — Z7982 Long term (current) use of aspirin: Secondary | ICD-10-CM | POA: Insufficient documentation

## 2018-11-08 DIAGNOSIS — I771 Stricture of artery: Secondary | ICD-10-CM

## 2018-11-08 DIAGNOSIS — K219 Gastro-esophageal reflux disease without esophagitis: Secondary | ICD-10-CM | POA: Diagnosis not present

## 2018-11-08 DIAGNOSIS — I729 Aneurysm of unspecified site: Secondary | ICD-10-CM

## 2018-11-08 DIAGNOSIS — F419 Anxiety disorder, unspecified: Secondary | ICD-10-CM | POA: Insufficient documentation

## 2018-11-08 DIAGNOSIS — I651 Occlusion and stenosis of basilar artery: Secondary | ICD-10-CM | POA: Diagnosis not present

## 2018-11-08 DIAGNOSIS — N400 Enlarged prostate without lower urinary tract symptoms: Secondary | ICD-10-CM | POA: Diagnosis not present

## 2018-11-08 DIAGNOSIS — I6523 Occlusion and stenosis of bilateral carotid arteries: Secondary | ICD-10-CM | POA: Diagnosis not present

## 2018-11-08 DIAGNOSIS — Z79899 Other long term (current) drug therapy: Secondary | ICD-10-CM | POA: Diagnosis not present

## 2018-11-08 DIAGNOSIS — N182 Chronic kidney disease, stage 2 (mild): Secondary | ICD-10-CM | POA: Diagnosis not present

## 2018-11-08 DIAGNOSIS — I6503 Occlusion and stenosis of bilateral vertebral arteries: Secondary | ICD-10-CM | POA: Diagnosis not present

## 2018-11-08 DIAGNOSIS — Z87891 Personal history of nicotine dependence: Secondary | ICD-10-CM | POA: Insufficient documentation

## 2018-11-08 DIAGNOSIS — I6601 Occlusion and stenosis of right middle cerebral artery: Secondary | ICD-10-CM | POA: Diagnosis not present

## 2018-11-08 HISTORY — PX: IR ANGIO VERTEBRAL SEL VERTEBRAL BILAT MOD SED: IMG5369

## 2018-11-08 HISTORY — PX: IR ANGIO INTRA EXTRACRAN SEL COM CAROTID INNOMINATE BILAT MOD SED: IMG5360

## 2018-11-08 LAB — BASIC METABOLIC PANEL
Anion gap: 8 (ref 5–15)
BUN: 15 mg/dL (ref 8–23)
CO2: 27 mmol/L (ref 22–32)
Calcium: 9.5 mg/dL (ref 8.9–10.3)
Chloride: 104 mmol/L (ref 98–111)
Creatinine, Ser: 1.51 mg/dL — ABNORMAL HIGH (ref 0.61–1.24)
GFR calc Af Amer: 52 mL/min — ABNORMAL LOW (ref >60)
GFR calc non Af Amer: 45 mL/min — ABNORMAL LOW (ref >60)
Glucose, Bld: 93 mg/dL (ref 70–99)
Potassium: 3.8 mmol/L (ref 3.5–5.1)
Sodium: 139 mmol/L (ref 135–145)

## 2018-11-08 LAB — CBC
HCT: 46.1 % (ref 39.0–52.0)
Hemoglobin: 15.1 g/dL (ref 13.0–17.0)
MCH: 30.7 pg (ref 26.0–34.0)
MCHC: 32.8 g/dL (ref 30.0–36.0)
MCV: 93.7 fL (ref 80.0–100.0)
Platelets: 155 10*3/uL (ref 150–400)
RBC: 4.92 MIL/uL (ref 4.22–5.81)
RDW: 13.2 % (ref 11.5–15.5)
WBC: 5.3 10*3/uL (ref 4.0–10.5)
nRBC: 0 % (ref 0.0–0.2)

## 2018-11-08 LAB — PROTIME-INR
INR: 1 (ref 0.8–1.2)
Prothrombin Time: 13.4 seconds (ref 11.4–15.2)

## 2018-11-08 MED ORDER — LIDOCAINE HCL (PF) 1 % IJ SOLN
INTRAMUSCULAR | Status: AC | PRN
  Administered 2018-11-08: 5 mL

## 2018-11-08 MED ORDER — SODIUM CHLORIDE 0.9 % IV SOLN: Freq: Once | INTRAVENOUS | Status: DC

## 2018-11-08 MED ORDER — SODIUM CHLORIDE 0.9 % IV SOLN: INTRAVENOUS | Status: AC

## 2018-11-08 MED ORDER — FENTANYL CITRATE (PF) 100 MCG/2ML IJ SOLN
INTRAMUSCULAR | Status: AC
  Filled 2018-11-08: qty 2

## 2018-11-08 MED ORDER — HEPARIN SODIUM (PORCINE) 1000 UNIT/ML IJ SOLN
INTRAMUSCULAR | Status: AC
  Administered 2018-11-08: 10:00:00 2000 [IU]
  Filled 2018-11-08: qty 1

## 2018-11-08 MED ORDER — NITROGLYCERIN 1 MG/10 ML FOR IR/CATH LAB
INTRA_ARTERIAL | Status: AC
  Administered 2018-11-08: 400 ug
  Filled 2018-11-08: qty 10

## 2018-11-08 MED ORDER — FENTANYL CITRATE (PF) 100 MCG/2ML IJ SOLN
INTRAMUSCULAR | Status: AC | PRN
  Administered 2018-11-08: 25 ug via INTRAVENOUS

## 2018-11-08 MED ORDER — ATROPINE SULFATE 1 MG/10ML IJ SOSY
PREFILLED_SYRINGE | INTRAMUSCULAR | Status: AC
  Filled 2018-11-08: qty 10

## 2018-11-08 MED ORDER — VERAPAMIL HCL 2.5 MG/ML IV SOLN
INTRAVENOUS | Status: AC
  Administered 2018-11-08: 10:00:00 2.5 mg
  Filled 2018-11-08: qty 2

## 2018-11-08 MED ORDER — LIDOCAINE HCL 1 % IJ SOLN
INTRAMUSCULAR | Status: AC
  Filled 2018-11-08: qty 20

## 2018-11-08 MED ORDER — MIDAZOLAM HCL 2 MG/2ML IJ SOLN
INTRAMUSCULAR | Status: AC | PRN
  Administered 2018-11-08: 1 mg via INTRAVENOUS

## 2018-11-08 MED ORDER — IOHEXOL 300 MG/ML  SOLN
150.0000 mL | Freq: Once | INTRAMUSCULAR | Status: AC | PRN
  Administered 2018-11-08: 10:00:00 62 mL via INTRA_ARTERIAL

## 2018-11-08 MED ORDER — MIDAZOLAM HCL 2 MG/2ML IJ SOLN
INTRAMUSCULAR | Status: AC
  Filled 2018-11-08: qty 2

## 2018-11-08 NOTE — Telephone Encounter (Signed)
Called to schedule consult, no answer, left vm. AW  

## 2018-11-08 NOTE — H&P (Addendum)
Chief Complaint: Patient was seen in consultation today for cerebral angiogram at the request of John Salazar,John Salazar  Referring Physician(s): John Salazar,John Salazar  Supervising Physician: Luanne Bras  Patient Status: [name]  History of Present Illness: John Salazar is a 73 y.o. male with hx of cerebrovascular disease who underwent successful (R)ICA stent angioplasty in 08/2017. He has done well, taking his ASA and Plavix daily. He is here today for follow up cerebral angiogram. PMHx, meds, labs, imaging, allergies reviewed. Feels well, no recent fevers, chills, illness. Has been NPO today as directed.    Past Medical History:  Diagnosis Date  . Anxiety   . BPH (benign prostatic hyperplasia)   . Chronic kidney disease    stage 2  . Colon polyps   . Erectile dysfunction   . GERD (gastroesophageal reflux disease)   . History of stomach ulcers   . Hypercholesteremia   . Hypertension   . 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 APPENDECTOMY  1970  . CATARACT EXTRACTION W/ INTRAOCULAR LENS  IMPLANT, BILATERAL    . COLONOSCOPY  10/2012  . ESOPHAGOGASTRODUODENOSCOPY ENDOSCOPY  10/2012  . HERNIA REPAIR    . IR ANGIO INTRA EXTRACRAN SEL COM CAROTID INNOMINATE BILAT MOD SED  08/09/2017  . IR ANGIO VERTEBRAL SEL VERTEBRAL BILAT MOD SED  08/09/2017  . IR INTRAVSC STENT CERV CAROTID W/EMB-PROT MOD SED INCL ANGIO  08/23/2017  . PROSTATE SURGERY  1999   20 years ago, enlarged prostate  . RADIOLOGY WITH ANESTHESIA N/A 08/23/2017   Procedure: IR WITH ANESTHESIA STENT PLACEMENT;  Surgeon: Luanne Bras, MD;  Location: [place];  Service: Radiology;  Laterality: N/A;    Allergies: Bee venom and Penicillins  Medications: Prior to Admission medications   Medication Sig Start Date End Date Taking? Authorizing Provider  acetaminophen (TYLENOL) 500 MG tablet Take 1,000 mg by mouth every 6 (six) hours as needed for headache.     Yes [provider]  aspirin EC 81 MG tablet Take 81 mg by mouth daily.   Yes [provider]  clopidogrel (PLAVIX) 75 MG tablet Take 1 tablet (75 mg total) by mouth daily. 04/13/18  Yes Glorene Leitzke, Lennette Bihari, PA-C  diphenhydrAMINE (BENADRYL) 25 MG tablet Take 12.5 mg by mouth daily as needed for allergies.   Yes [provider]  fluticasone (FLONASE) 50 MCG/ACT nasal spray Place 1 spray into both nostrils daily as needed for allergies or rhinitis.   Yes [provider]  losartan (COZAAR) 100 MG tablet TAKE 1 TABLET ONCE DAILY. Patient taking differently: Take 100 mg by mouth daily.  04/20/18  Yes Lesleigh Noe, MD  Melatonin 10 MG TABS Take 10 mg by mouth at bedtime as needed (sleep).   Yes [provider]  pantoprazole (PROTONIX) 40 MG tablet TAKE 1 TABLET BY MOUTH DAILY. 07/16/18  Yes Lesleigh Noe, MD  rosuvastatin (CRESTOR) 20 MG tablet Take 1 tablet (20 mg total) by mouth daily. 03/29/18  Yes Lesleigh Noe, MD  tamsulosin (FLOMAX) 0.4 MG CAPS capsule Take 0.4 mg by mouth daily after supper.    Yes [provider]     Family History  Problem Relation Age of Onset  . Stroke Mother 52  . Aneurysm Father 81    Social History   Socioeconomic History  . Marital status: Married    Spouse name: pamela  . Number of children: 2  . Years of  education: 10  . Highest education level: Not on file  Occupational History  . Occupation: Oceanographer.  Social Needs  . Financial resource strain: Not on file  . Food insecurity    Worry: Not on file    Inability: Not on file  . Transportation needs    Medical: Not on file    Non-medical: Not on file  Tobacco Use  . Smoking status: Former Smoker    Packs/day: 2.00    Years: 25.00    Pack years: 50.00    Types: Cigarettes    Quit date: 03/10/1996    Years since quitting: 22.6  . Smokeless tobacco: Never Used  Substance and Sexual Activity  . Alcohol use: No    Alcohol/week: 0.0  standard drinks    Comment: in the past drank, quit 1993  . Drug use: No  . Sexual activity: Not Currently  Lifestyle  . Physical activity    Days per week: Not on file    Minutes per session: Not on file  . Stress: Not on file  Relationships  . Social Herbalist on phone: Not on file    Gets together: Not on file    Attends religious service: Not on file    Active member of club or organization: Not on file    Attends meetings of clubs or organizations: Not on file    Relationship status: Not on file  Other Topics Concern  . Not on file  Social History Narrative   Lives with [name] (Wife) - together for 25 years   Children - [name] and [name] -- one is a Marine scientist in [employer] and other works for [employer] - 17, lives in [place]   Enjoys - playing golf, goes to [employer] and sponsors people as well, church   Support - family, good friends in [place]   Exercise - walks the dog, runs up hills   Diet - improved from before, tries to drink water - low salt or no salt     Review of Systems: A 12 point ROS discussed and pertinent positives are indicated in the HPI above.  All other systems are negative.  Review of Systems  Vital Signs: BP (!) 158/98   Pulse 66   Temp 97.6 F (36.4 C) (Oral)   Resp 20   Ht 5\' 9"  (1.753 m)   Wt 72.6 kg   SpO2 100%   BMI 23.63 kg/m   Physical Exam Constitutional:      Appearance: Normal appearance.  HENT:     Mouth/Throat:     Mouth: Mucous membranes are moist.     Pharynx: Oropharynx is clear.  Neck:     Musculoskeletal: Normal range of motion and neck supple. No muscular tenderness.     Vascular: No carotid bruit.  Cardiovascular:     Rate and Rhythm: Normal rate and regular rhythm.     Pulses: Normal pulses.     Heart sounds: Normal heart sounds.  Pulmonary:     Effort: Pulmonary effort is normal. No respiratory distress.     Breath sounds: Normal breath sounds.  Abdominal:     General: Abdomen is flat. There is no  distension.     Palpations: Abdomen is soft.  Skin:    General: Skin is warm and dry.  Neurological:     General: No focal deficit present.     Mental Status: He is alert and oriented to person,  place, and time.  Psychiatric:        Mood and Affect: Mood normal.      Imaging: No results found.  Labs:  CBC: Recent Labs    04/09/18 0959 11/08/18 0639  WBC 4.8 5.3  HGB 14.9 15.1  HCT 44.2 46.1  PLT 157.0 155    COAGS: Recent Labs    11/08/18 0639  INR 1.0    BMP: Recent Labs    02/05/18 0825 03/06/18 1516 04/09/18 0959 11/08/18 0639  NA 140 138 140 139  K 4.4 4.0 4.4 3.8  CL 106 105 104 104  CO2 28 26 29 27   GLUCOSE 88 127* 88 93  BUN 20 25* 22 15  CALCIUM 9.6 9.6 10.1 9.5  CREATININE 1.55* 1.65* 1.55* 1.51*  GFRNONAA  --   --   --  45*  GFRAA  --   --   --  52*    LIVER FUNCTION TESTS: Recent Labs    11/09/17 04/09/18 0959  BILITOT  --  0.8  AST 17 19  ALT 15 17  ALKPHOS  --  75  PROT  --  7.5  ALBUMIN  --  4.6    TUMOR MARKERS: No results for input(s): AFPTM, CEA, CA199, CHROMGRNA in the last 8760 hours.  Assessment and Plan: Hx carotis stenosis, s/p prior stent angioplasty For cerebral arteriogram today. Labs reviewed. Risks and benefits of cerebral angiogram were discussed with the patient including, but not limited to bleeding, infection, vascular injury or contrast induced renal failure.  This interventional procedure involves the use of X-rays and because of the nature of the planned procedure, it is possible that we will have prolonged use of X-ray fluoroscopy.  Potential radiation risks to you include (but are not limited to) the following: - A slightly elevated risk for cancer  several years later in life. This risk is typically less than 0.5% percent. This risk is low in comparison to the normal incidence of human cancer, which is 33% for women and 50% for men according to the [place]. - Radiation induced  injury can include skin redness, resembling a rash, tissue breakdown / ulcers and hair loss (which can be temporary or permanent).   The likelihood of either of these occurring depends on the difficulty of the procedure and whether you are sensitive to radiation due to previous procedures, disease, or genetic conditions.   IF your procedure requires a prolonged use of radiation, you will be notified and given written instructions for further action.  It is your responsibility to monitor the irradiated area for the 2 weeks following the procedure and to notify your physician if you are concerned that you have suffered a radiation induced injury.    All of the patient's questions were answered, patient is agreeable to proceed.  Consent signed and in chart.     Thank you for this interesting consult.  I greatly enjoyed meeting LEWIS STULLER and look forward to participating in their care.  A copy of this report was sent to the requesting provider on this date.  Electronically Signed: Ascencion Dike, PA-C 11/08/2018, 7:39 AM   I spent a total of 25 minutes in face to face in clinical consultation, greater than 50% of which was counseling/coordinating care for cerebral angio

## 2018-11-08 NOTE — Procedures (Signed)
S.P 4 vessel cerebral arteriogram RT radial approach. Findings. 1.RT MCa M1 50 % stenosis. 2.Lt ICA superior hypophyseal aneurysm approx 24mm x 3.51mm  3.Patent Rt ICA stent 4.3x98mm and 76mm x 52mm LT ICA post wall bulb ?ulcerations associated with 30 % stenosis. S.Miku Udall MD

## 2018-11-08 NOTE — Discharge Instructions (Signed)
Moderate Conscious Sedation, Adult, Care After °These instructions provide you with information about caring for yourself after your procedure. Your health care provider may also give you more specific instructions. Your treatment has been planned according to current medical practices, but problems sometimes occur. Call your health care provider if you have any problems or questions after your procedure. °What can I expect after the procedure? °After your procedure, it is common: °· To feel sleepy for several hours. °· To feel clumsy and have poor balance for several hours. °· To have poor judgment for several hours. °· To vomit if you eat too soon. °Follow these instructions at home: °For at least 24 hours after the procedure: ° °· Do not: °? Participate in activities where you could fall or become injured. °? Drive. °? Use heavy machinery. °? Drink alcohol. °? Take sleeping pills or medicines that cause drowsiness. °? Make important decisions or sign legal documents. °? Take care of children on your own. °· Rest. °Eating and drinking °· Follow the diet recommended by your health care provider. °· If you vomit: °? Drink water, juice, or soup when you can drink without vomiting. °? Make sure you have little or no nausea before eating solid foods. °General instructions °· Have a responsible adult stay with you until you are awake and alert. °· Take over-the-counter and prescription medicines only as told by your health care provider. °· If you smoke, do not smoke without supervision. °· Keep all follow-up visits as told by your health care provider. This is important. °Contact a health care provider if: °· You keep feeling nauseous or you keep vomiting. °· You feel light-headed. °· You develop a rash. °· You have a fever. °Get help right away if: °· You have trouble breathing. °This information is not intended to replace advice given to you by your health care provider. Make sure you discuss any questions you have  with your health care provider. °Document Released: 11/14/2012 Document Revised: 01/06/2017 Document Reviewed: 05/16/2015 °Elsevier Patient Education © 2020 [employer] °Radial Site Care ° °This sheet gives you information about how to care for yourself after your procedure. Your health care provider may also give you more specific instructions. If you have problems or questions, contact your health care provider. °What can I expect after the procedure? °After the procedure, it is common to have: °· Bruising and tenderness at the catheter insertion area. °Follow these instructions at home: °Medicines °· Take over-the-counter and prescription medicines only as told by your health care provider. °Insertion site care °· Follow instructions from your health care provider about how to take care of your insertion site. Make sure you: °? Wash your hands with soap and water before you change your bandage (dressing). If soap and water are not available, use hand sanitizer. °? Change your dressing as told by your health care provider. °? Leave stitches (sutures), skin glue, or adhesive strips in place. These skin closures may need to stay in place for 2 weeks or longer. If adhesive strip edges start to loosen and curl up, you may trim the loose edges. Do not remove adhesive strips completely unless your health care provider tells you to do that. °· Check your insertion site every day for signs of infection. Check for: °? Redness, swelling, or pain. °? Fluid or blood. °? Pus or a bad smell. °? Warmth. °· Do not take baths, swim, or use a hot tub until your health care provider approves. °· You may   shower 24-48 hours after the procedure, or as directed by your health care provider. °? Remove the dressing and gently wash the site with plain soap and water. °? Pat the area dry with a clean towel. °? Do not rub the site. That could cause bleeding. °· Do not apply powder or lotion to the site. °Activity ° °· For 24 hours after  the procedure, or as directed by your health care provider: °? Do not flex or bend the affected arm. °? Do not push or pull heavy objects with the affected arm. °? Do not drive yourself home from the hospital or clinic. You may drive 24 hours after the procedure unless your health care provider tells you not to. °? Do not operate machinery or power tools. °· Do not lift anything that is heavier than 10 lb (4.5 kg), or the limit that you are told, until your health care provider says that it is safe. °· Ask your health care provider when it is okay to: °? Return to work or school. °? Resume usual physical activities or sports. °? Resume sexual activity. °General instructions °· If the catheter site starts to bleed, raise your arm and put firm pressure on the site. If the bleeding does not stop, get help right away. This is a medical emergency. °· If you went home on the same day as your procedure, a responsible adult should be with you for the first 24 hours after you arrive home. °· Keep all follow-up visits as told by your health care provider. This is important. °Contact a health care provider if: °· You have a fever. °· You have redness, swelling, or yellow drainage around your insertion site. °Get help right away if: °· You have unusual pain at the radial site. °· The catheter insertion area swells very fast. °· The insertion area is bleeding, and the bleeding does not stop when you hold steady pressure on the area. °· Your arm or hand becomes pale, cool, tingly, or numb. °These symptoms may represent a serious problem that is an emergency. Do not wait to see if the symptoms will go away. Get medical help right away. Call your local emergency services (911 in the [place] Do not drive yourself to the hospital. °Summary °· After the procedure, it is common to have bruising and tenderness at the site. °· Follow instructions from your health care provider about how to take care of your radial site wound. Check the  wound every day for signs of infection. °· Do not lift anything that is heavier than 10 lb (4.5 kg), or the limit that you are told, until your health care provider says that it is safe. °This information is not intended to replace advice given to you by your health care provider. Make sure you discuss any questions you have with your health care provider. °Document Released: 02/26/2010 Document Revised: 03/01/2017 Document Reviewed: 03/01/2017 °Elsevier Patient Education © 2020 Else[employer] Inc[employer] ° °

## 2018-11-08 NOTE — Sedation Documentation (Signed)
O2/ETCO2 monitor removed per md

## 2018-11-09 ENCOUNTER — Telehealth: Payer: Self-pay | Admitting: Internal Medicine

## 2018-11-09 NOTE — Telephone Encounter (Signed)
Pt called to report that he recently saw his PCP and he has been having exertional tightness... he says he feels well after resting but he cannot walk as far as he used to without feeling bad and having to stop. He does not have nitro at home.. he has some SOB with exertion but breathes well at rest.   Pt to see Robbie Lis PA Monday 11/12/18 and will consider going to the ER if his symptoms worsen over the weekend.       COVID-19 Pre-Screening Questions:  . In the past 7 to 10 days have you had a cough,  shortness of breath, headache, congestion, fever (100 or greater) body aches, chills, sore throat, or sudden loss of taste or sense of smell? NO . Have you been around anyone with known Covid 19. . Have you been around anyone who is awaiting Covid 19 test results in the past 7 to 10 days? NO . Have you been around anyone who has been exposed to Covid 19, or has mentioned symptoms of Covid 19 within the past 7 to 10 days? NO  If you have any concerns/questions about symptoms patients report during screening (either on the phone or at threshold). Contact the provider seeing the patient or DOD for further guidance.  If neither are available contact a member of the leadership team.

## 2018-11-09 NOTE — Telephone Encounter (Signed)
° ° °  Pt c/o of Chest Pain: STAT if CP now or developed within 24 hours  1. Are you having CP right now? no  2. Are you experiencing any other symptoms (ex. SOB, nausea, vomiting, sweating)? no  3. How long have you been experiencing CP? months  4. Is your CP continuous or coming and going? Coming and going when walking  5. Have you taken Nitroglycerin? no ?

## 2018-11-09 NOTE — Progress Notes (Signed)
Cardiology Office Note    Date:  11/12/2018   ID:  John Salazar, John Salazar 1945-10-08, MRN [id number]  PCP:  Lesleigh Noe, MD  Cardiologist:  Dr. Harrington Challenger Chief Complaint: CP  History of Present Illness:   John Salazar is a 73 y.o. male with hx of HTN, HLD, GERD, s/p stenting of right ICA proximal stenosis,   Hx of Dizziness. He underwent a diagnostic cerebral angiogram with Dr. Deveshwar7/04/2017. He then underwent a cerebral angiogram with revascularization of his right ICA proximal stenosis with Dr. Estanislado Pandy 08/23/2017.  He underwent cerebral angiogram on 11/08/2018 1.RT MCa M1 50 % stenosis. 2.Lt ICA superior hypophyseal aneurysm approx 78mm x 3.38mm  3.Patent Rt ICA stent 4.3x42mm and 29mm x 84mm LT ICA post wall bulb ?ulcerations associated with 30 % stenosis.  Presented with CP.  Patient has exertional chest pressure for many months, recently exacerbated.  He used to walk 3 miles few months ago but unable to do so lately.  Associated symptoms include shortness of breath.  No radiation, nausea or vomiting.  He also reports exertional fatigue.  He has been able to push himself secondary to pressure.  He has 35 years of smoking history, he smokes 1 to 2 packs/day.  He quit in 1998.   Past Medical History:  Diagnosis Date  . Anxiety   . BPH (benign prostatic hyperplasia)   . Chronic kidney disease    stage 2  . Colon polyps   . Erectile dysfunction   . GERD (gastroesophageal reflux disease)   . History of stomach ulcers   . Hypercholesteremia   . Hypertension   . 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 APPENDECTOMY  1970  . CATARACT EXTRACTION W/ INTRAOCULAR LENS  IMPLANT, BILATERAL    . COLONOSCOPY  10/2012  . ESOPHAGOGASTRODUODENOSCOPY ENDOSCOPY  10/2012  . HERNIA REPAIR    . IR ANGIO INTRA EXTRACRAN SEL COM CAROTID INNOMINATE BILAT MOD SED  08/09/2017  . IR ANGIO INTRA EXTRACRAN SEL COM CAROTID INNOMINATE BILAT MOD SED   11/08/2018  . IR ANGIO VERTEBRAL SEL VERTEBRAL BILAT MOD SED  08/09/2017  . IR INTRAVSC STENT CERV CAROTID W/EMB-PROT MOD SED INCL ANGIO  08/23/2017  . PROSTATE SURGERY  1999   20 years ago, enlarged prostate  . RADIOLOGY WITH ANESTHESIA N/A 08/23/2017   Procedure: IR WITH ANESTHESIA STENT PLACEMENT;  Surgeon: Luanne Bras, MD;  Location: [facility];  Service: Radiology;  Laterality: N/A;    Current Medications: Prior to Admission medications   Medication Sig Start Date End Date Taking? Authorizing Provider  acetaminophen (TYLENOL) 500 MG tablet Take 1,000 mg by mouth every 6 (six) hours as needed for headache.     [provider]  aspirin EC 81 MG tablet Take 81 mg by mouth daily.    [provider]  clopidogrel (PLAVIX) 75 MG tablet Take 1 tablet (75 mg total) by mouth daily. 04/13/18   Ascencion Dike, PA-C  diphenhydrAMINE (BENADRYL) 25 MG tablet Take 12.5 mg by mouth daily as needed for allergies.    [provider]  fluticasone (FLONASE) 50 MCG/ACT nasal spray Place 1 spray into both nostrils daily as needed for allergies or rhinitis.    [provider]  losartan (COZAAR) 100 MG tablet TAKE 1 TABLET ONCE DAILY. Patient taking differently: Take 100 mg by mouth daily.  04/20/18   Lesleigh Noe, MD  Melatonin 10 MG  TABS Take 10 mg by mouth at bedtime as needed (sleep).    [provider]  pantoprazole (PROTONIX) 40 MG tablet TAKE 1 TABLET BY MOUTH DAILY. 07/16/18   Lesleigh Noe, MD  rosuvastatin (CRESTOR) 20 MG tablet Take 1 tablet (20 mg total) by mouth daily. 03/29/18   Lesleigh Noe, MD  tamsulosin (FLOMAX) 0.4 MG CAPS capsule Take 0.4 mg by mouth daily after supper.     [provider]    Allergies:   Bee venom and Penicillins   Social History   Socioeconomic History  . Marital status: Married    Spouse name: pamela  . Number of children: 2  . Years of education: 62  . Highest education level: Not on file  Occupational  History  . Occupation: Oceanographer.  Social Needs  . Financial resource strain: Not on file  . Food insecurity    Worry: Not on file    Inability: Not on file  . Transportation needs    Medical: Not on file    Non-medical: Not on file  Tobacco Use  . Smoking status: Former Smoker    Packs/day: 2.00    Years: 25.00    Pack years: 50.00    Types: Cigarettes    Quit date: 03/10/1996    Years since quitting: 22.6  . Smokeless tobacco: Never Used  Substance and Sexual Activity  . Alcohol use: No    Alcohol/week: 0.0 standard drinks    Comment: in the past drank, quit 1993  . Drug use: No  . Sexual activity: Not Currently  Lifestyle  . Physical activity    Days per week: Not on file    Minutes per session: Not on file  . Stress: Not on file  Relationships  . Social Herbalist on phone: Not on file    Gets together: Not on file    Attends religious service: Not on file    Active member of club or organization: Not on file    Attends meetings of clubs or organizations: Not on file    Relationship status: Not on file  Other Topics Concern  . Not on file  Social History Narrative   Lives with [name] (Wife) - together for 25 years   Children - [name] and [name] -- one is a Marine scientist in [employer] and other works for [employer] - 18, lives in [place]   Enjoys - playing golf, goes to [employer] and sponsors people as well, church   Support - family, good friends in [place]   Exercise - walks the dog, runs up hills   Diet - improved from before, tries to drink water - low salt or no salt     Family History:  The patient's family history includes Aneurysm (age of onset: 66) in his father; Stroke (age of onset: 103) in his mother.   ROS:   Please see the history of present illness.    ROS All other systems reviewed and are negative.   PHYSICAL EXAM:   VS:  BP 116/82   Pulse 80   Ht 5\' 9"  (1.753 m)   Wt 164 lb 12.8 oz (74.8 kg)   SpO2 94%   BMI 24.34 kg/m     GEN: Well nourished, well developed, in no acute distress  HEENT: normal  Neck: no JVD, carotid bruits, or masses Cardiac: RRR; no murmurs, rubs, or gallops,no edema  Respiratory:  clear to auscultation  bilaterally, normal work of breathing GI: soft, nontender, nondistended, + BS MS: no deformity or atrophy  Skin: warm and dry, no rash Neuro:  Alert and Oriented x 3, Strength and sensation are intact Psych: euthymic mood, full affect  Wt Readings from Last 3 Encounters:  11/12/18 164 lb 12.8 oz (74.8 kg)  11/08/18 160 lb (72.6 kg)  10/04/18 168 lb 4 oz (76.3 kg)      Studies/Labs Reviewed:   EKG:  EKG is ordered today.  The ekg ordered today demonstrates normal sinus rhythm at rate of 80 bpm  Recent Labs: 04/09/2018: ALT 17 11/08/2018: BUN 15; Creatinine, Ser 1.51; Hemoglobin 15.1; Platelets 155; Potassium 3.8; Sodium 139   Lipid Panel    Component Value Date/Time   CHOL 136 04/09/2018 0959   TRIG 111.0 04/09/2018 0959   HDL 58.40 04/09/2018 0959   CHOLHDL 2 04/09/2018 0959   VLDL 22.2 04/09/2018 0959   LDLCALC 55 04/09/2018 0959    Additional studies/ records that were reviewed today include:   Carotid doppler 09/2018 Summary: Right Carotid: Right ICA stent is patent with less than 50% stenosis.  Left Carotid: Velocities in the left ICA are consistent with a 1-39% stenosis.   ASSESSMENT & PLAN:    1. Exertional angina Limiting exertional activity due to chest pressure.  No symptoms at rest.  His cardiac risk factor includes prior tobacco smoking, hypertension, hyperlipidemia and vascular disease.  Discussed invasive versus noninvasive evaluation in detail.  After long discussion, will proceed with cardiac catheterization for definite evaluation of coronary anatomy.  Start low-dose beta-blocker.  He is already on dual antiplatelet therapy with aspirin and Plavix.  Previously did not tolerated Brilinta secondary to shortness of breath.  Continue Crestor 20 mg.  2.   Hyperlipidemia -Currently taking Crestor 20 mg daily.  Mild leg cramping at night.  Will reevaluate during next office visit.  3.  Hypertension -Blood pressure stable on Crestor.  Add beta-blocker as above.  4.  CKD stage III -Baseline serum creatinine 1.5.  Hold Crestor on day of procedure.  Advised to drink plenty of water.    Medication Adjustments/Labs and Tests Ordered: Current medicines are reviewed at length with the patient today.  Concerns regarding medicines are outlined above.  Medication changes, Labs and Tests ordered today are listed in the Patient Instructions below. Patient Instructions  Medication Instructions:  Your physician has recommended you make the following change in your medication:  1.  START Metoprolol 25 mg taking 1/2 tablet twice a day  If you need a refill on your cardiac medications before your next appointment, please call your pharmacy.   Lab work: None ordered  If you have labs (blood work) drawn today and your tests are completely normal, you will receive your results only by: Marland Kitchen MyChart Message (if you have MyChart) OR . A paper copy in the mail If you have any lab test that is abnormal or we need to change your treatment, we will call you to review the results.  Testing/Procedures: Your physician has requested that you have a cardiac catheterization. Cardiac catheterization is used to diagnose and/or treat various heart conditions. Doctors may recommend this procedure for a number of different reasons. The most common reason is to evaluate chest pain. Chest pain can be a symptom of coronary artery disease (CAD), and cardiac catheterization can show whether plaque is narrowing or blocking your heart's arteries. This procedure is also used to evaluate the valves, as well as measure the blood  flow and oxygen levels in different parts of your heart. For further information please visit [email or web address]. Please follow instruction sheet, BELOW:      [facility] OFFICE [place], [place] [address] [place] [address] Dept: [phone] Loc: [facility]  11/12/2018  You are scheduled for a Cardiac Catheterization on Thursday, October 8 with Dr. Harrell Gave End.  1. Please arrive at the [facility] (Main Entrance A) at [facility]: [place], [place] [address] at 5:30 AM (This time is two hours before your procedure to ensure your preparation). Free valet parking service is available.   Special note: Every effort is made to have your procedure done on time. Please understand that emergencies sometimes delay scheduled procedures.  2. Diet: Do not eat solid foods after midnight.  The patient may have clear liquids until 5am upon the day of the procedure.  3. Labs: You will need to go to the [facility] on [address] to get your [phone] test done.  Once that is completed, you will have to go home to quarantine until your procedure on Thursday.   4. Medication instructions in preparation for your procedure:   Contrast Allergy: No  Stop taking, LOSARTAN  Thursday, October 8,   On the morning of your procedure, take your Aspirin and Plavix/Clopidogrel and any morning medicines NOT listed above.  You may use sips of water.  5. Plan for one night stay--bring personal belongings. 6. Bring a current list of your medications and current insurance cards. 7. You MUST have a responsible person to drive you home. 8. Someone MUST be with you the first 24 hours after you arrive home or your discharge will be delayed. 9. Please wear clothes that are easy to get on and off and wear slip-on shoes.  Thank you for allowing [place] to care for you!   -- [facility]    Follow-Up: At [facility], you and your health needs are our priority.  As part of our continuing mission to provide you  with exceptional heart care, we have created designated Provider Care Teams.  These Care Teams include your primary Cardiologist (physician) and Advanced Practice Providers (APPs -  Physician Assistants and Nurse Practitioners) who all work together to provide you with the care you need, when you need it. Marland Kitchen You are scheduled for a follow-up appointment, in office, 11/28/2018, arrive at 9:00 for registration to see Robbie Lis, PA-C  Any Other Special Instructions Will Be Listed Below (If Applicable).       Jarrett Soho, [place]  11/12/2018 9:27 AM    [facility] [address], [place], [place]  [address] Phone: [phone]; Fax: [phone]

## 2018-11-09 NOTE — H&P (View-Only) (Signed)
Cardiology Office Note    Date:  11/12/2018   ID:  John Salazar, Chillis 1946-01-29, MRN [id number]  PCP:  Lesleigh Noe, MD  Cardiologist:  Dr. Harrington Challenger Chief Complaint: CP  History of Present Illness:   John Salazar is a 73 y.o. male with hx of HTN, HLD, GERD, s/p stenting of right ICA proximal stenosis,   Hx of Dizziness. He underwent a diagnostic cerebral angiogram with Dr. Deveshwar7/04/2017. He then underwent a cerebral angiogram with revascularization of his right ICA proximal stenosis with Dr. Estanislado Pandy 08/23/2017.  He underwent cerebral angiogram on 11/08/2018 1.RT MCa M1 50 % stenosis. 2.Lt ICA superior hypophyseal aneurysm approx 39mm x 3.26mm  3.Patent Rt ICA stent 4.3x71mm and 42mm x 24mm LT ICA post wall bulb ?ulcerations associated with 30 % stenosis.  Presented with CP.  Patient has exertional chest pressure for many months, recently exacerbated.  He used to walk 3 miles few months ago but unable to do so lately.  Associated symptoms include shortness of breath.  No radiation, nausea or vomiting.  He also reports exertional fatigue.  He has been able to push himself secondary to pressure.  He has 35 years of smoking history, he smokes 1 to 2 packs/day.  He quit in 1998.   Past Medical History:  Diagnosis Date  . Anxiety   . BPH (benign prostatic hyperplasia)   . Chronic kidney disease    stage 2  . Colon polyps   . Erectile dysfunction   . GERD (gastroesophageal reflux disease)   . History of stomach ulcers   . Hypercholesteremia   . Hypertension   . 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 APPENDECTOMY  1970  . CATARACT EXTRACTION W/ INTRAOCULAR LENS  IMPLANT, BILATERAL    . COLONOSCOPY  10/2012  . ESOPHAGOGASTRODUODENOSCOPY ENDOSCOPY  10/2012  . HERNIA REPAIR    . IR ANGIO INTRA EXTRACRAN SEL COM CAROTID INNOMINATE BILAT MOD SED  08/09/2017  . IR ANGIO INTRA EXTRACRAN SEL COM CAROTID INNOMINATE BILAT MOD SED   11/08/2018  . IR ANGIO VERTEBRAL SEL VERTEBRAL BILAT MOD SED  08/09/2017  . IR INTRAVSC STENT CERV CAROTID W/EMB-PROT MOD SED INCL ANGIO  08/23/2017  . PROSTATE SURGERY  1999   20 years ago, enlarged prostate  . RADIOLOGY WITH ANESTHESIA N/A 08/23/2017   Procedure: IR WITH ANESTHESIA STENT PLACEMENT;  Surgeon: Luanne Bras, MD;  Location: [facility];  Service: Radiology;  Laterality: N/A;    Current Medications: Prior to Admission medications   Medication Sig Start Date End Date Taking? Authorizing Provider  acetaminophen (TYLENOL) 500 MG tablet Take 1,000 mg by mouth every 6 (six) hours as needed for headache.     [provider]  aspirin EC 81 MG tablet Take 81 mg by mouth daily.    [provider]  clopidogrel (PLAVIX) 75 MG tablet Take 1 tablet (75 mg total) by mouth daily. 04/13/18   Ascencion Dike, PA-C  diphenhydrAMINE (BENADRYL) 25 MG tablet Take 12.5 mg by mouth daily as needed for allergies.    [provider]  fluticasone (FLONASE) 50 MCG/ACT nasal spray Place 1 spray into both nostrils daily as needed for allergies or rhinitis.    [provider]  losartan (COZAAR) 100 MG tablet TAKE 1 TABLET ONCE DAILY. Patient taking differently: Take 100 mg by mouth daily.  04/20/18   Lesleigh Noe, MD  Melatonin 10 MG  TABS Take 10 mg by mouth at bedtime as needed (sleep).    [provider]  pantoprazole (PROTONIX) 40 MG tablet TAKE 1 TABLET BY MOUTH DAILY. 07/16/18   Lesleigh Noe, MD  rosuvastatin (CRESTOR) 20 MG tablet Take 1 tablet (20 mg total) by mouth daily. 03/29/18   Lesleigh Noe, MD  tamsulosin (FLOMAX) 0.4 MG CAPS capsule Take 0.4 mg by mouth daily after supper.     [provider]    Allergies:   Bee venom and Penicillins   Social History   Socioeconomic History  . Marital status: Married    Spouse name: pamela  . Number of children: 2  . Years of education: 5  . Highest education level: Not on file  Occupational  History  . Occupation: Oceanographer.  Social Needs  . Financial resource strain: Not on file  . Food insecurity    Worry: Not on file    Inability: Not on file  . Transportation needs    Medical: Not on file    Non-medical: Not on file  Tobacco Use  . Smoking status: Former Smoker    Packs/day: 2.00    Years: 25.00    Pack years: 50.00    Types: Cigarettes    Quit date: 03/10/1996    Years since quitting: 22.6  . Smokeless tobacco: Never Used  Substance and Sexual Activity  . Alcohol use: No    Alcohol/week: 0.0 standard drinks    Comment: in the past drank, quit 1993  . Drug use: No  . Sexual activity: Not Currently  Lifestyle  . Physical activity    Days per week: Not on file    Minutes per session: Not on file  . Stress: Not on file  Relationships  . Social Herbalist on phone: Not on file    Gets together: Not on file    Attends religious service: Not on file    Active member of club or organization: Not on file    Attends meetings of clubs or organizations: Not on file    Relationship status: Not on file  Other Topics Concern  . Not on file  Social History Narrative   Lives with [name] (Wife) - together for 25 years   Children - [name] and [name] -- one is a Marine scientist in [facility] and other works for [employer] - 18, lives in [place]   Enjoys - playing golf, goes to [employer] and sponsors people as well, church   Support - family, good friends in [place]   Exercise - walks the dog, runs up hills   Diet - improved from before, tries to drink water - low salt or no salt     Family History:  The patient's family history includes Aneurysm (age of onset: 76) in his father; Stroke (age of onset: 31) in his mother.   ROS:   Please see the history of present illness.    ROS All other systems reviewed and are negative.   PHYSICAL EXAM:   VS:  BP 116/82   Pulse 80   Ht 5\' 9"  (1.753 m)   Wt 164 lb 12.8 oz (74.8 kg)   SpO2 94%   BMI 24.34 kg/m     GEN: Well nourished, well developed, in no acute distress  HEENT: normal  Neck: no JVD, carotid bruits, or masses Cardiac: RRR; no murmurs, rubs, or gallops,no edema  Respiratory:  clear to auscultation  bilaterally, normal work of breathing GI: soft, nontender, nondistended, + BS MS: no deformity or atrophy  Skin: warm and dry, no rash Neuro:  Alert and Oriented x 3, Strength and sensation are intact Psych: euthymic mood, full affect  Wt Readings from Last 3 Encounters:  11/12/18 164 lb 12.8 oz (74.8 kg)  11/08/18 160 lb (72.6 kg)  10/04/18 168 lb 4 oz (76.3 kg)      Studies/Labs Reviewed:   EKG:  EKG is ordered today.  The ekg ordered today demonstrates normal sinus rhythm at rate of 80 bpm  Recent Labs: 04/09/2018: ALT 17 11/08/2018: BUN 15; Creatinine, Ser 1.51; Hemoglobin 15.1; Platelets 155; Potassium 3.8; Sodium 139   Lipid Panel    Component Value Date/Time   CHOL 136 04/09/2018 0959   TRIG 111.0 04/09/2018 0959   HDL 58.40 04/09/2018 0959   CHOLHDL 2 04/09/2018 0959   VLDL 22.2 04/09/2018 0959   LDLCALC 55 04/09/2018 0959    Additional studies/ records that were reviewed today include:   Carotid doppler 09/2018 Summary: Right Carotid: Right ICA stent is patent with less than 50% stenosis.  Left Carotid: Velocities in the left ICA are consistent with a 1-39% stenosis.   ASSESSMENT & PLAN:    1. Exertional angina Limiting exertional activity due to chest pressure.  No symptoms at rest.  His cardiac risk factor includes prior tobacco smoking, hypertension, hyperlipidemia and vascular disease.  Discussed invasive versus noninvasive evaluation in detail.  After long discussion, will proceed with cardiac catheterization for definite evaluation of coronary anatomy.  Start low-dose beta-blocker.  He is already on dual antiplatelet therapy with aspirin and Plavix.  Previously did not tolerated Brilinta secondary to shortness of breath.  Continue Crestor 20 mg.  2.   Hyperlipidemia -Currently taking Crestor 20 mg daily.  Mild leg cramping at night.  Will reevaluate during next office visit.  3.  Hypertension -Blood pressure stable on Crestor.  Add beta-blocker as above.  4.  CKD stage III -Baseline serum creatinine 1.5.  Hold Crestor on day of procedure.  Advised to drink plenty of water.    Medication Adjustments/Labs and Tests Ordered: Current medicines are reviewed at length with the patient today.  Concerns regarding medicines are outlined above.  Medication changes, Labs and Tests ordered today are listed in the Patient Instructions below. Patient Instructions  Medication Instructions:  Your physician has recommended you make the following change in your medication:  1.  START Metoprolol 25 mg taking 1/2 tablet twice a day  If you need a refill on your cardiac medications before your next appointment, please call your pharmacy.   Lab work: None ordered  If you have labs (blood work) drawn today and your tests are completely normal, you will receive your results only by: Marland Kitchen MyChart Message (if you have MyChart) OR . A paper copy in the mail If you have any lab test that is abnormal or we need to change your treatment, we will call you to review the results.  Testing/Procedures: Your physician has requested that you have a cardiac catheterization. Cardiac catheterization is used to diagnose and/or treat various heart conditions. Doctors may recommend this procedure for a number of different reasons. The most common reason is to evaluate chest pain. Chest pain can be a symptom of coronary artery disease (CAD), and cardiac catheterization can show whether plaque is narrowing or blocking your heart's arteries. This procedure is also used to evaluate the valves, as well as measure the blood  flow and oxygen levels in different parts of your heart. For further information please visit HugeFiesta.[email or web address]. Please follow instruction sheet, BELOW:      [facility] OFFICE [place], [place] [address] Dept: [phone] Loc: [place]  11/12/2018  You are scheduled for a Cardiac Catheterization on Thursday, October 8 with Dr. Harrell Gave End.  1. Please arrive at the [facility] (Main Entrance A) at [facility]: [place], [place] [address] at 5:30 AM (This time is two hours before your procedure to ensure your preparation). Free valet parking service is available.   Special note: Every effort is made to have your procedure done on time. Please understand that emergencies sometimes delay scheduled procedures.  2. Diet: Do not eat solid foods after midnight.  The patient may have clear liquids until 5am upon the day of the procedure.  3. Labs: You will need to go to the [facility] on [address] to get your [phone] test done.  Once that is completed, you will have to go home to quarantine until your procedure on Thursday.   4. Medication instructions in preparation for your procedure:   Contrast Allergy: No  Stop taking, LOSARTAN  Thursday, October 8,   On the morning of your procedure, take your Aspirin and Plavix/Clopidogrel and any morning medicines NOT listed above.  You may use sips of water.  5. Plan for one night stay--bring personal belongings. 6. Bring a current list of your medications and current insurance cards. 7. You MUST have a responsible person to drive you home. 8. Someone MUST be with you the first 24 hours after you arrive home or your discharge will be delayed. 9. Please wear clothes that are easy to get on and off and wear slip-on shoes.  Thank you for allowing [place] to care for you!   --  Invasive Cardiovascular services    Follow-Up: At [facility], you and your health needs are our priority.  As part of our continuing mission to provide you  with exceptional heart care, we have created designated Provider Care Teams.  These Care Teams include your primary Cardiologist (physician) and Advanced Practice Providers (APPs -  Physician Assistants and Nurse Practitioners) who all work together to provide you with the care you need, when you need it. Marland Kitchen You are scheduled for a follow-up appointment, in office, 11/28/2018, arrive at 9:00 for registration to see Robbie Lis, PA-C  Any Other Special Instructions Will Be Listed Below (If Applicable).       Jarrett Soho, [place]  11/12/2018 9:27 AM    [facility] [place], [place], [place]  [address] Phone: [phone]; Fax: [phone]

## 2018-11-12 ENCOUNTER — Other Ambulatory Visit (HOSPITAL_COMMUNITY)
Admission: RE | Admit: 2018-11-12 | Discharge: 2018-11-12 | Disposition: A | Discharge status: Home / Self Care | Payer: PPO | Source: Ambulatory Visit | Attending: Internal Medicine | Admitting: Internal Medicine

## 2018-11-12 ENCOUNTER — Other Ambulatory Visit: Payer: Self-pay

## 2018-11-12 ENCOUNTER — Telehealth (HOSPITAL_COMMUNITY): Payer: Self-pay

## 2018-11-12 ENCOUNTER — Ambulatory Visit: Payer: PPO | Admitting: Physician Assistant

## 2018-11-12 ENCOUNTER — Encounter: Payer: Self-pay | Admitting: Physician Assistant

## 2018-11-12 VITALS — BP 116/82 | HR 80 | Ht 69.0 in | Wt 164.8 lb

## 2018-11-12 DIAGNOSIS — I10 Essential (primary) hypertension: Secondary | ICD-10-CM

## 2018-11-12 DIAGNOSIS — E782 Mixed hyperlipidemia: Secondary | ICD-10-CM | POA: Diagnosis not present

## 2018-11-12 DIAGNOSIS — R0789 Other chest pain: Secondary | ICD-10-CM

## 2018-11-12 DIAGNOSIS — I209 Angina pectoris, unspecified: Secondary | ICD-10-CM | POA: Diagnosis not present

## 2018-11-12 DIAGNOSIS — Z20828 Contact with and (suspected) exposure to other viral communicable diseases: Secondary | ICD-10-CM | POA: Insufficient documentation

## 2018-11-12 DIAGNOSIS — R079 Chest pain, unspecified: Secondary | ICD-10-CM | POA: Diagnosis not present

## 2018-11-12 DIAGNOSIS — Z01812 Encounter for preprocedural laboratory examination: Secondary | ICD-10-CM | POA: Insufficient documentation

## 2018-11-12 MED ORDER — METOPROLOL TARTRATE 25 MG PO TABS: 12.5000 mg | ORAL_TABLET | Freq: Two times a day (BID) | ORAL | 1 refills | Status: DC

## 2018-11-12 NOTE — Telephone Encounter (Signed)
-----   Message from [name], [place] sent at 11/08/2018 12:44 PM EDT ----- John Salazar,  This patient underwent an image-guided dx cerebral arteriogram this AM. Dr. Estanislado Pandy would like to schedule patient for consultation to discuss results, first available. Please schedule and please let me know if you have any questions.  Thanks! Radonna Ricker

## 2018-11-12 NOTE — Patient Instructions (Addendum)
Medication Instructions:  Your physician has recommended you make the following change in your medication:  1.  START Metoprolol 25 mg taking 1/2 tablet twice a day  If you need a refill on your cardiac medications before your next appointment, please call your pharmacy.   Lab work: None ordered  If you have labs (blood work) drawn today and your tests are completely normal, you will receive your results only by: Marland Kitchen MyChart Message (if you have MyChart) OR . A paper copy in the mail If you have any lab test that is abnormal or we need to change your treatment, we will call you to review the results.  Testing/Procedures: Your physician has requested that you have a cardiac catheterization. Cardiac catheterization is used to diagnose and/or treat various heart conditions. Doctors may recommend this procedure for a number of different reasons. The most common reason is to evaluate chest pain. Chest pain can be a symptom of coronary artery disease (CAD), and cardiac catheterization can show whether plaque is narrowing or blocking your heart's arteries. This procedure is also used to evaluate the valves, as well as measure the blood flow and oxygen levels in different parts of your heart. For further information please visit HugeFiesta.tn. Please follow instruction sheet, BELOW:     [place] OFFICE [place], [place] [address] Dept: [phone] Loc: [place]  11/12/2018  You are scheduled for a Cardiac Catheterization on Thursday, October 8 with Dr. Harrell Gave End.  1. Please arrive at the [facility] (Main Entrance A) at [facility]: [place], [place] [address] at 5:30 AM (This time is two hours before your procedure to ensure your preparation). Free valet parking service is available.   Special note: Every effort is made to have your  procedure done on time. Please understand that emergencies sometimes delay scheduled procedures.  2. Diet: Do not eat solid foods after midnight.  The patient may have clear liquids until 5am upon the day of the procedure.  3. Labs: You will need to go to the [facility] on [address] to get your [phone] test done.  Once that is completed, you will have to go home to quarantine until your procedure on Thursday.   4. Medication instructions in preparation for your procedure:   Contrast Allergy: No  Stop taking, LOSARTAN  Thursday, October 8,   On the morning of your procedure, take your Aspirin and Plavix/Clopidogrel and any morning medicines NOT listed above.  You may use sips of water.  5. Plan for one night stay--bring personal belongings. 6. Bring a current list of your medications and current insurance cards. 7. You MUST have a responsible person to drive you home. 8. Someone MUST be with you the first 24 hours after you arrive home or your discharge will be delayed. 9. Please wear clothes that are easy to get on and off and wear slip-on shoes.  Thank you for allowing [place] to care for you!   -- [place] Invasive Cardiovascular services    Follow-Up: At [facility], you and your health needs are our priority.  As part of our continuing mission to provide you with exceptional heart care, we have created designated Provider Care Teams.  These Care Teams include your primary Cardiologist (physician) and Advanced Practice Providers (APPs -  Physician Assistants and Nurse Practitioners) who all work together to provide  you with the care you need, when you need it. Marland Kitchen You are scheduled for a follow-up appointment, in office, 11/28/2018, arrive at 9:00 for registration to see Robbie Lis, PA-C  Any Other Special Instructions Will Be Listed Below (If Applicable).

## 2018-11-13 ENCOUNTER — Encounter (HOSPITAL_COMMUNITY): Payer: Self-pay

## 2018-11-13 LAB — NOVEL CORONAVIRUS, NAA (HOSP ORDER, SEND-OUT TO REF LAB; TAT 18-24 HRS): SARS-CoV-2, NAA: NOT DETECTED

## 2018-11-14 ENCOUNTER — Telehealth: Payer: Self-pay | Admitting: *Deleted

## 2018-11-14 NOTE — Telephone Encounter (Addendum)
Pt contacted pre-catheterization scheduled at [facility] for: Thursday November 15, 2018 10:30 AM Verified arrival time and place: [facility]) at: 5:30 AM-pre procedure hydration   No solid food after midnight prior to cath, clear liquids until 5 AM day of procedure. Contrast allergy: no  Hold: Losartan-day before and day of procedure-GFR 45 -pt had already taken today.  Except hold medications AM meds can be  taken pre-cath with sip of water including: ASA 81 mg Plavix 75 mg  Confirmed patient has responsible adult to drive home post procedure and observe 24 hours after arriving home-yes  Currently, due to Covid-19 pandemic, only one support person will be allowed with patient. Must be the same support person for that patient's entire stay, will be screened and required to wear a mask. They will be asked to wait in the waiting room for the duration of the patient's stay.  Patients are required to wear a mask when they enter the hospital.     COVID-19 Pre-Screening Questions:  . In the past 7 to 10 days have you had a cough,  shortness of breath, headache, congestion, fever (100 or greater) body aches, chills, sore throat, or sudden loss of taste or sense of smell? no . Have you been around anyone with known Covid 19? no . Have you been around anyone who is awaiting Covid 19 test results in the past 7 to 10 days? no . Have you been around anyone who has been exposed to Covid 19, or has mentioned symptoms of Covid 19 within the past 7 to 10 days? no   I reviewed procedure/mask/visitor instructions, Covid-19 screening questions with patient, he verbalized understanding, thanked me for call.

## 2018-11-15 ENCOUNTER — Ambulatory Visit (HOSPITAL_COMMUNITY)
Admission: RE | Admit: 2018-11-15 | Discharge: 2018-11-15 | Disposition: A | Discharge status: Home / Self Care | Payer: PPO | Attending: Internal Medicine | Admitting: Internal Medicine

## 2018-11-15 ENCOUNTER — Other Ambulatory Visit: Payer: Self-pay

## 2018-11-15 ENCOUNTER — Encounter (HOSPITAL_COMMUNITY)
Admission: RE | Disposition: A | Discharge status: Home / Self Care | Payer: Self-pay | Source: Home / Self Care | Attending: Internal Medicine

## 2018-11-15 DIAGNOSIS — E7800 Pure hypercholesterolemia, unspecified: Secondary | ICD-10-CM | POA: Insufficient documentation

## 2018-11-15 DIAGNOSIS — Z7902 Long term (current) use of antithrombotics/antiplatelets: Secondary | ICD-10-CM | POA: Insufficient documentation

## 2018-11-15 DIAGNOSIS — Z79899 Other long term (current) drug therapy: Secondary | ICD-10-CM | POA: Diagnosis not present

## 2018-11-15 DIAGNOSIS — Z880 Allergy status to penicillin: Secondary | ICD-10-CM | POA: Insufficient documentation

## 2018-11-15 DIAGNOSIS — I200 Unstable angina: Secondary | ICD-10-CM | POA: Diagnosis present

## 2018-11-15 DIAGNOSIS — E785 Hyperlipidemia, unspecified: Secondary | ICD-10-CM | POA: Insufficient documentation

## 2018-11-15 DIAGNOSIS — I25110 Atherosclerotic heart disease of native coronary artery with unstable angina pectoris: Secondary | ICD-10-CM | POA: Diagnosis not present

## 2018-11-15 DIAGNOSIS — Z87891 Personal history of nicotine dependence: Secondary | ICD-10-CM | POA: Diagnosis not present

## 2018-11-15 DIAGNOSIS — N183 Chronic kidney disease, stage 3 unspecified: Secondary | ICD-10-CM | POA: Diagnosis not present

## 2018-11-15 DIAGNOSIS — Z8249 Family history of ischemic heart disease and other diseases of the circulatory system: Secondary | ICD-10-CM | POA: Diagnosis not present

## 2018-11-15 DIAGNOSIS — N400 Enlarged prostate without lower urinary tract symptoms: Secondary | ICD-10-CM | POA: Insufficient documentation

## 2018-11-15 DIAGNOSIS — I2584 Coronary atherosclerosis due to calcified coronary lesion: Secondary | ICD-10-CM | POA: Insufficient documentation

## 2018-11-15 DIAGNOSIS — Z7982 Long term (current) use of aspirin: Secondary | ICD-10-CM | POA: Diagnosis not present

## 2018-11-15 DIAGNOSIS — R252 Cramp and spasm: Secondary | ICD-10-CM | POA: Diagnosis not present

## 2018-11-15 DIAGNOSIS — Z95828 Presence of other vascular implants and grafts: Secondary | ICD-10-CM | POA: Diagnosis not present

## 2018-11-15 DIAGNOSIS — K219 Gastro-esophageal reflux disease without esophagitis: Secondary | ICD-10-CM | POA: Diagnosis not present

## 2018-11-15 DIAGNOSIS — I129 Hypertensive chronic kidney disease with stage 1 through stage 4 chronic kidney disease, or unspecified chronic kidney disease: Secondary | ICD-10-CM | POA: Diagnosis not present

## 2018-11-15 HISTORY — PX: LEFT HEART CATH AND CORONARY ANGIOGRAPHY: CATH118249

## 2018-11-15 LAB — BASIC METABOLIC PANEL
Anion gap: 8 (ref 5–15)
BUN: 14 mg/dL (ref 8–23)
CO2: 25 mmol/L (ref 22–32)
Calcium: 9.3 mg/dL (ref 8.9–10.3)
Chloride: 109 mmol/L (ref 98–111)
Creatinine, Ser: 1.37 mg/dL — ABNORMAL HIGH (ref 0.61–1.24)
GFR calc Af Amer: 58 mL/min — ABNORMAL LOW (ref >60)
GFR calc non Af Amer: 50 mL/min — ABNORMAL LOW (ref >60)
Glucose, Bld: 94 mg/dL (ref 70–99)
Potassium: 4.1 mmol/L (ref 3.5–5.1)
Sodium: 142 mmol/L (ref 135–145)

## 2018-11-15 SURGERY — LEFT HEART CATH AND CORONARY ANGIOGRAPHY
Anesthesia: LOCAL

## 2018-11-15 MED ORDER — SODIUM CHLORIDE 0.9 % IV SOLN: 250.0000 mL | INTRAVENOUS | Status: DC | PRN

## 2018-11-15 MED ORDER — VERAPAMIL HCL 2.5 MG/ML IV SOLN
INTRAVENOUS | Status: AC
  Filled 2018-11-15: qty 2

## 2018-11-15 MED ORDER — HEPARIN SODIUM (PORCINE) 1000 UNIT/ML IJ SOLN
INTRAMUSCULAR | Status: DC | PRN
  Administered 2018-11-15: 3500 [IU] via INTRAVENOUS

## 2018-11-15 MED ORDER — HEPARIN (PORCINE) IN NACL 1000-0.9 UT/500ML-% IV SOLN
INTRAVENOUS | Status: DC | PRN
  Administered 2018-11-15 (×2): 500 mL

## 2018-11-15 MED ORDER — SODIUM CHLORIDE 0.9 % WEIGHT BASED INFUSION
3.0000 mL/kg/h | INTRAVENOUS | Status: AC
  Administered 2018-11-15: 06:00:00 3 mL/kg/h via INTRAVENOUS

## 2018-11-15 MED ORDER — HEPARIN (PORCINE) IN NACL 1000-0.9 UT/500ML-% IV SOLN
INTRAVENOUS | Status: AC
  Filled 2018-11-15: qty 1000

## 2018-11-15 MED ORDER — FENTANYL CITRATE (PF) 100 MCG/2ML IJ SOLN
INTRAMUSCULAR | Status: DC | PRN
  Administered 2018-11-15 (×2): 25 ug via INTRAVENOUS

## 2018-11-15 MED ORDER — FENTANYL CITRATE (PF) 100 MCG/2ML IJ SOLN
INTRAMUSCULAR | Status: AC
  Filled 2018-11-15: qty 2

## 2018-11-15 MED ORDER — ISOSORBIDE MONONITRATE ER 30 MG PO TB24: 15.0000 mg | ORAL_TABLET | Freq: Every day | ORAL | 5 refills | Status: DC

## 2018-11-15 MED ORDER — ASPIRIN 81 MG PO CHEW: 81.0000 mg | CHEWABLE_TABLET | ORAL | Status: DC

## 2018-11-15 MED ORDER — NITROGLYCERIN 0.4 MG SL SUBL: 0.4000 mg | SUBLINGUAL_TABLET | SUBLINGUAL | 99 refills | Status: DC | PRN

## 2018-11-15 MED ORDER — LIDOCAINE HCL (PF) 1 % IJ SOLN
INTRAMUSCULAR | Status: DC | PRN
  Administered 2018-11-15: 2 mL

## 2018-11-15 MED ORDER — HEPARIN (PORCINE) IN NACL 1000-0.9 UT/500ML-% IV SOLN
INTRAVENOUS | Status: AC
  Filled 2018-11-15: qty 500

## 2018-11-15 MED ORDER — LIDOCAINE HCL (PF) 1 % IJ SOLN
INTRAMUSCULAR | Status: AC
  Filled 2018-11-15: qty 30

## 2018-11-15 MED ORDER — SODIUM CHLORIDE 0.9% FLUSH: 3.0000 mL | Freq: Two times a day (BID) | INTRAVENOUS | Status: DC

## 2018-11-15 MED ORDER — MIDAZOLAM HCL 2 MG/2ML IJ SOLN
INTRAMUSCULAR | Status: AC
  Filled 2018-11-15: qty 2

## 2018-11-15 MED ORDER — MIDAZOLAM HCL 2 MG/2ML IJ SOLN
INTRAMUSCULAR | Status: DC | PRN
  Administered 2018-11-15 (×2): 1 mg via INTRAVENOUS

## 2018-11-15 MED ORDER — SODIUM CHLORIDE 0.9 % WEIGHT BASED INFUSION: 1.0000 mL/kg/h | INTRAVENOUS | Status: DC

## 2018-11-15 MED ORDER — VERAPAMIL HCL 2.5 MG/ML IV SOLN
INTRAVENOUS | Status: DC | PRN
  Administered 2018-11-15: 10 mL via INTRA_ARTERIAL

## 2018-11-15 MED ORDER — SODIUM CHLORIDE 0.9% FLUSH: 3.0000 mL | INTRAVENOUS | Status: DC | PRN

## 2018-11-15 MED ORDER — IOHEXOL 350 MG/ML SOLN
INTRAVENOUS | Status: DC | PRN
  Administered 2018-11-15: 65 mL

## 2018-11-15 MED ORDER — HEPARIN SODIUM (PORCINE) 1000 UNIT/ML IJ SOLN
INTRAMUSCULAR | Status: AC
  Filled 2018-11-15: qty 1

## 2018-11-15 SURGICAL SUPPLY — 10 items
CATH 5FR JL3.5 JR4 ANG PIG MP (CATHETERS) ×1 IMPLANT
CATH INFINITI 5 FR MPA2 (CATHETERS) ×1 IMPLANT
DEVICE RAD COMP TR BAND LRG (VASCULAR PRODUCTS) ×1 IMPLANT
GLIDESHEATH SLEND SS 6F .021 (SHEATH) ×1 IMPLANT
GUIDEWIRE INQWIRE 1.5J.035X260 (WIRE) IMPLANT
INQWIRE 1.5J .035X260CM (WIRE) ×2
KIT HEART LEFT (KITS) ×2 IMPLANT
PACK CARDIAC CATHETERIZATION (CUSTOM PROCEDURE TRAY) ×2 IMPLANT
TRANSDUCER W/STOPCOCK (MISCELLANEOUS) ×2 IMPLANT
TUBING CIL FLEX 10 FLL-RA (TUBING) ×2 IMPLANT

## 2018-11-15 NOTE — Discharge Instructions (Signed)
Radial Site Care ° °This sheet gives you information about how to care for yourself after your procedure. Your health care provider may also give you more specific instructions. If you have problems or questions, contact your health care provider. °What can I expect after the procedure? °After the procedure, it is common to have: °· Bruising and tenderness at the catheter insertion area. °Follow these instructions at home: °Medicines °· Take over-the-counter and prescription medicines only as told by your health care provider. °Insertion site care °· Follow instructions from your health care provider about how to take care of your insertion site. Make sure you: °? Wash your hands with soap and water before you change your bandage (dressing). If soap and water are not available, use hand sanitizer. °? Change your dressing as told by your health care provider. °? Leave stitches (sutures), skin glue, or adhesive strips in place. These skin closures may need to stay in place for 2 weeks or longer. If adhesive strip edges start to loosen and curl up, you may trim the loose edges. Do not remove adhesive strips completely unless your health care provider tells you to do that. °· Check your insertion site every day for signs of infection. Check for: °? Redness, swelling, or pain. °? Fluid or blood. °? Pus or a bad smell. °? Warmth. °· Do not take baths, swim, or use a hot tub until your health care provider approves. °· You may shower 24-48 hours after the procedure, or as directed by your health care provider. °? Remove the dressing and gently wash the site with plain soap and water. °? Pat the area dry with a clean towel. °? Do not rub the site. That could cause bleeding. °· Do not apply powder or lotion to the site. °Activity ° °· For 24 hours after the procedure, or as directed by your health care provider: °? Do not flex or bend the affected arm. °? Do not push or pull heavy objects with the affected arm. °? Do not  drive yourself home from the hospital or clinic. You may drive 24 hours after the procedure unless your health care provider tells you not to. °? Do not operate machinery or power tools. °· Do not lift anything that is heavier than 10 lb (4.5 kg), or the limit that you are told, until your health care provider says that it is safe. °· Ask your health care provider when it is okay to: °? Return to work or school. °? Resume usual physical activities or sports. °? Resume sexual activity. °General instructions °· If the catheter site starts to bleed, raise your arm and put firm pressure on the site. If the bleeding does not stop, get help right away. This is a medical emergency. °· If you went home on the same day as your procedure, a responsible adult should be with you for the first 24 hours after you arrive home. °· Keep all follow-up visits as told by your health care provider. This is important. °Contact a health care provider if: °· You have a fever. °· You have redness, swelling, or yellow drainage around your insertion site. °Get help right away if: °· You have unusual pain at the radial site. °· The catheter insertion area swells very fast. °· The insertion area is bleeding, and the bleeding does not stop when you hold steady pressure on the area. °· Your arm or hand becomes pale, cool, tingly, or numb. °These symptoms may represent a serious problem   that is an emergency. Do not wait to see if the symptoms will go away. Get medical help right away. Call your local emergency services (911 in the [place] Do not drive yourself to the hospital. °Summary °· After the procedure, it is common to have bruising and tenderness at the site. °· Follow instructions from your health care provider about how to take care of your radial site wound. Check the wound every day for signs of infection. °· Do not lift anything that is heavier than 10 lb (4.5 kg), or the limit that you are told, until your health care provider says  that it is safe. °This information is not intended to replace advice given to you by your health care provider. Make sure you discuss any questions you have with your health care provider. °Document Released: 02/26/2010 Document Revised: 03/01/2017 Document Reviewed: 03/01/2017 °Elsevier Patient Education © 2020 [employer] Inc. ° °

## 2018-11-15 NOTE — Interval H&P Note (Signed)
History and Physical Interval Note:  11/15/2018 11:51 AM  John Salazar  has presented today for cardiac catheterization, with the diagnosis of accelerating angina.  The various methods of treatment have been discussed with the patient and family. After consideration of risks, benefits and other options for treatment, the patient has consented to  Procedure(s): LEFT HEART CATH AND CORONARY ANGIOGRAPHY (N/A) as a surgical intervention.  The patient's history has been reviewed, patient examined, no change in status, stable for surgery.  I have reviewed the patient's chart and labs.  Questions were answered to the patient's satisfaction.    Cath Lab Visit (complete for each Cath Lab visit)  Clinical Evaluation Leading to the Procedure:   ACS: No.  Non-ACS:    Anginal Classification: CCS III  Anti-ischemic medical therapy: Minimal Therapy (1 class of medications)  Non-Invasive Test Results: No non-invasive testing performed  Prior CABG: No previous CABG  Semaje Kinker

## 2018-11-16 ENCOUNTER — Encounter (HOSPITAL_COMMUNITY): Payer: Self-pay | Admitting: Internal Medicine

## 2018-11-20 ENCOUNTER — Telehealth: Payer: Self-pay

## 2018-11-20 NOTE — Telephone Encounter (Signed)
I spoke to the patient who had a heart cath on 10/8 with Dr End.  He is having residual CP and wants to discuss with Vin at the 10/21 OV.  He understands the medication that he has been prescribed and will monitor BP/HR and symptoms up until next week's visit.  He understands that if symptoms worsen, he will go to the ED.

## 2018-11-27 ENCOUNTER — Ambulatory Visit (HOSPITAL_COMMUNITY): Payer: PPO

## 2018-11-27 NOTE — Progress Notes (Signed)
Cardiology Office Note    Date:  11/28/2018   ID:  Tiziano, Pringle 08-28-1945, MRN [id number]  PCP:  Lesleigh Noe, MD  Cardiologist:  Dr. Harrington Challenger  Chief Complaint: cath  follow up  History of Present Illness:   John Salazar is a 73 y.o. male with hx of HTN, HLD, GERD, s/p stenting of right ICA for dizziness presents for cath follow up.   Hx of Dizziness s/p cerebral angiogram with revascularization of his right ICA proximal stenosis with Dr. Estanislado Pandy 08/23/2017.  He underwent cerebral angiogram on 11/08/2018 1.RT MCa M1 50 % stenosis. 2.Lt ICA superior hypophyseal aneurysm approx 70mm x 3.48mm  3.Patent Rt ICA stent 4.3x38mm and 54mm x 25mm LT ICA post wall bulb ?ulcerations associated with 30 % stenosis.  He has 35 years of smoking history, he smokes 1 to 2 packs/day.  He quit in 1998.  Seen by me 11/12/2018 for chest pain concerning for angina. Added BB. Follow up cath showed severe single-vessel coronary artery disease involving a branch of OM1 (80% ostial stenosis) and distal LCx/OM2 (70%).  The affected vessels/branches are small and not well-suited to PCI (vessel size 2 mm or less). Recommended medical therapy. He was already on DAPT with ASA and plavix for vascular disease.   Here today for follow up. The patient denies nausea, vomiting, fever, chest pain, palpitations, shortness of breath, orthopnea, PND, dizziness, syncope, cough, congestion, abdominal pain, hematochezia, melena, lower extremity edema. No recurrent chest pain. Recommended gradual building of exercise program.   Past Medical History:  Diagnosis Date  . Anxiety   . BPH (benign prostatic hyperplasia)   . Chronic kidney disease    stage 2  . Colon polyps   . Erectile dysfunction   . GERD (gastroesophageal reflux disease)   . History of stomach ulcers   . Hypercholesteremia   . Hypertension   . 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 APPENDECTOMY   1970  . CATARACT EXTRACTION W/ INTRAOCULAR LENS  IMPLANT, BILATERAL    . COLONOSCOPY  10/2012  . ESOPHAGOGASTRODUODENOSCOPY ENDOSCOPY  10/2012  . HERNIA REPAIR    . IR ANGIO INTRA EXTRACRAN SEL COM CAROTID INNOMINATE BILAT MOD SED  08/09/2017  . IR ANGIO INTRA EXTRACRAN SEL COM CAROTID INNOMINATE BILAT MOD SED  11/08/2018  . IR ANGIO VERTEBRAL SEL VERTEBRAL BILAT MOD SED  08/09/2017  . IR ANGIO VERTEBRAL SEL VERTEBRAL BILAT MOD SED  11/08/2018  . IR INTRAVSC STENT CERV CAROTID W/EMB-PROT MOD SED INCL ANGIO  08/23/2017  . LEFT HEART CATH AND CORONARY ANGIOGRAPHY N/A 11/15/2018   Procedure: LEFT HEART CATH AND CORONARY ANGIOGRAPHY;  Surgeon: Nelva Bush, MD;  Location: [facility];  Service: Cardiovascular;  Laterality: N/A;  . PROSTATE SURGERY  1999   20 years ago, enlarged prostate  . RADIOLOGY WITH ANESTHESIA N/A 08/23/2017   Procedure: IR WITH ANESTHESIA STENT PLACEMENT;  Surgeon: Luanne Bras, MD;  Location: [facility];  Service: Radiology;  Laterality: N/A;    Current Medications: Prior to Admission medications   Medication Sig Start Date End Date Taking? Authorizing Provider  acetaminophen (TYLENOL) 500 MG tablet Take 1,000 mg by mouth every 6 (six) hours as needed for headache.     [provider]  aspirin EC 81 MG tablet Take 81 mg by mouth daily.    [provider]  clopidogrel (PLAVIX) 75 MG tablet Take 1 tablet (  75 mg total) by mouth daily. 04/13/18   Ascencion Dike, PA-C  diphenhydrAMINE (BENADRYL) 25 MG tablet Take 12.5 mg by mouth daily as needed for allergies.    [provider]  fluticasone (FLONASE) 50 MCG/ACT nasal spray Place 1 spray into both nostrils daily as needed for allergies or rhinitis.    [provider]  isosorbide mononitrate (IMDUR) 30 MG 24 hr tablet Take 0.5 tablets (15 mg total) by mouth daily. 11/15/18 11/15/19  End, Harrell Gave, MD  losartan (COZAAR) 100 MG tablet TAKE 1 TABLET ONCE DAILY. Patient taking differently:  Take 100 mg by mouth daily.  04/20/18   Lesleigh Noe, MD  Melatonin 10 MG TABS Take 10 mg by mouth at bedtime as needed (sleep).    [provider]  metoprolol tartrate (LOPRESSOR) 25 MG tablet Take 0.5 tablets (12.5 mg total) by mouth 2 (two) times daily. 11/12/18 02/10/19  Aleicia Kenagy, Crista Luria, PA  nitroGLYCERIN (NITROSTAT) 0.4 MG SL tablet Place 1 tablet (0.4 mg total) under the tongue every 5 (five) minutes as needed for chest pain. 11/15/18 11/15/19  End, Harrell Gave, MD  pantoprazole (PROTONIX) 40 MG tablet TAKE 1 TABLET BY MOUTH DAILY. 07/16/18   Lesleigh Noe, MD  rosuvastatin (CRESTOR) 20 MG tablet Take 1 tablet (20 mg total) by mouth daily. 03/29/18   Lesleigh Noe, MD  tamsulosin (FLOMAX) 0.4 MG CAPS capsule Take 0.4 mg by mouth daily after supper.     [provider]    Allergies:   Bee venom and Penicillins   Social History   Socioeconomic History  . Marital status: Married    Spouse name: pamela  . Number of children: 2  . Years of education: 70  . Highest education level: Not on file  Occupational History  . Occupation: Oceanographer.  Social Needs  . Financial resource strain: Not on file  . Food insecurity    Worry: Not on file    Inability: Not on file  . Transportation needs    Medical: Not on file    Non-medical: Not on file  Tobacco Use  . Smoking status: Former Smoker    Packs/day: 2.00    Years: 25.00    Pack years: 50.00    Types: Cigarettes    Quit date: 03/10/1996    Years since quitting: 22.7  . Smokeless tobacco: Never Used  Substance and Sexual Activity  . Alcohol use: No    Alcohol/week: 0.0 standard drinks    Comment: in the past drank, quit 1993  . Drug use: No  . Sexual activity: Not Currently  Lifestyle  . Physical activity    Days per week: Not on file    Minutes per session: Not on file  . Stress: Not on file  Relationships  . Social Herbalist on phone: Not on file    Gets together: Not on file     Attends religious service: Not on file    Active member of club or organization: Not on file    Attends meetings of clubs or organizations: Not on file    Relationship status: Not on file  Other Topics Concern  . Not on file  Social History Narrative   Lives with [name] (Wife) - together for 25 years   Children - [name] and [name] -- one is a Marine scientist in [facility] and other works for [employer] - 18, lives in [place]   Enjoys - playing golf,  goes to [employer] and sponsors people as well, church   Support - family, good friends in [place]   Exercise - walks the dog, runs up hills   Diet - improved from before, tries to drink water - low salt or no salt     Family History:  The patient's family history includes Aneurysm (age of onset: 49) in his father; Stroke (age of onset: 6) in his mother.   ROS:   Please see the history of present illness.    ROS All other systems reviewed and are negative.   PHYSICAL EXAM:   VS:  BP 124/80   Pulse 67   Ht 5\' 9"  (1.753 m)   Wt 164 lb (74.4 kg)   SpO2 99%   BMI 24.22 kg/m    GEN: Well nourished, well developed, in no acute distress  HEENT: normal  Neck: no JVD, carotid bruits, or masses Cardiac: RRR; no murmurs, rubs, or gallops,no edema  Respiratory:  clear to auscultation bilaterally, normal work of breathing GI: soft, nontender, nondistended, + BS MS: no deformity or atrophy  Skin: warm and dry, no rash Neuro:  Alert and Oriented x 3, Strength and sensation are intact Psych: euthymic mood, full affect  Wt Readings from Last 3 Encounters:  11/28/18 164 lb (74.4 kg)  11/15/18 160 lb (72.6 kg)  11/12/18 164 lb 12.8 oz (74.8 kg)      Studies/Labs Reviewed:   EKG:  EKG is not ordered today.    Recent Labs: 04/09/2018: ALT 17 11/08/2018: Hemoglobin 15.1; Platelets 155 11/15/2018: BUN 14; Creatinine, Ser 1.37; Potassium 4.1; Sodium 142   Lipid Panel    Component Value Date/Time   CHOL 136 04/09/2018 0959   TRIG 111.0  04/09/2018 0959   HDL 58.40 04/09/2018 0959   CHOLHDL 2 04/09/2018 0959   VLDL 22.2 04/09/2018 0959   LDLCALC 55 04/09/2018 0959    Additional studies/ records that were reviewed today include:   EFT HEART CATH AND CORONARY ANGIOGRAPHY  Conclusion  Conclusions: 1. Severe single-vessel coronary artery disease involving a branch of OM1 (80% ostial stenosis) and distal LCx/OM2 (70%).  The affected vessels/branches are small and not well-suited to PCI (vessel size 2 mm or less). 2. Mild to moderate, non-obstructive coronary artery disease involving the LMCA, LAD, and RCA. 3. Normal left ventricular filling pressure.  Recommendations: 1. Aggressive medical therapy and secondary prevention.  I will add isosorbide mononitrate 15 mg daily, to be escalated as tolerated. 2. Continue antiplatelet and statin therapy. 3. Close outpatient follow-up with Dr. Elon Jester.    Diagnostic Dominance: Right  Intervention    ASSESSMENT & PLAN:    1. CAD  - Cath as above. Recommended medical therapy. He was already on DAPT for vascular disease.   2. HLD -04/09/2018: Cholesterol 136; HDL 58.40; LDL Cholesterol 55; Triglycerides 111.0; VLDL 22.2  - Continue statin - Discussed diet and exercise in detail  3. HTN - BP stable on current medications.   Medication Adjustments/Labs and Tests Ordered: Current medicines are reviewed at length with the patient today.  Concerns regarding medicines are outlined above.  Medication changes, Labs and Tests ordered today are listed in the Patient Instructions below. Patient Instructions  Medication Instructions:  Your physician recommends that you continue on your current medications as directed. Please refer to the Current Medication list given to you today.  *If you need a refill on your cardiac medications before your next appointment, please call your pharmacy*  Lab Work: None ordered  If you have labs (blood work) drawn today and your tests are  completely normal, you will receive your results only by: Marland Kitchen MyChart Message (if you have MyChart) OR . A paper copy in the mail If you have any lab test that is abnormal or we need to change your treatment, we will call you to review the results.  Testing/Procedures: None ordered  Follow-Up: At [facility], you and your health needs are our priority.  As part of our continuing mission to provide you with exceptional heart care, we have created designated Provider Care Teams.  These Care Teams include your primary Cardiologist (physician) and Advanced Practice Providers (APPs -  Physician Assistants and Nurse Practitioners) who all work together to provide you with the care you need, when you need it.  Your next appointment:   12 months  The format for your next appointment:   In Person  Provider:   You may see Robbie Lis, PA-C      Jarrett Soho, [place]  11/28/2018 9:31 AM    [facility] [address], [place], [place]  [address] Phone: [phone]; Fax: [phone]

## 2018-11-28 ENCOUNTER — Ambulatory Visit: Payer: PPO | Admitting: Cardiology

## 2018-11-28 ENCOUNTER — Encounter: Payer: Self-pay | Admitting: Physician Assistant

## 2018-11-28 ENCOUNTER — Other Ambulatory Visit: Payer: Self-pay

## 2018-11-28 ENCOUNTER — Ambulatory Visit: Payer: PPO | Admitting: Physician Assistant

## 2018-11-28 VITALS — BP 124/80 | HR 67 | Ht 69.0 in | Wt 164.0 lb

## 2018-11-28 DIAGNOSIS — I2510 Atherosclerotic heart disease of native coronary artery without angina pectoris: Secondary | ICD-10-CM | POA: Diagnosis not present

## 2018-11-28 DIAGNOSIS — I10 Essential (primary) hypertension: Secondary | ICD-10-CM | POA: Diagnosis not present

## 2018-11-28 DIAGNOSIS — E782 Mixed hyperlipidemia: Secondary | ICD-10-CM | POA: Diagnosis not present

## 2018-11-28 NOTE — Patient Instructions (Addendum)
Medication Instructions:  Your physician recommends that you continue on your current medications as directed. Please refer to the Current Medication list given to you today.  *If you need a refill on your cardiac medications before your next appointment, please call your pharmacy*  Lab Work: None ordered  If you have labs (blood work) drawn today and your tests are completely normal, you will receive your results only by: Marland Kitchen MyChart Message (if you have MyChart) OR . A paper copy in the mail If you have any lab test that is abnormal or we need to change your treatment, we will call you to review the results.  Testing/Procedures: None ordered  Follow-Up: At [facility], you and your health needs are our priority.  As part of our continuing mission to provide you with exceptional heart care, we have created designated Provider Care Teams.  These Care Teams include your primary Cardiologist (physician) and Advanced Practice Providers (APPs -  Physician Assistants and Nurse Practitioners) who all work together to provide you with the care you need, when you need it.  Your next appointment:   12 months  The format for your next appointment:   In Person  Provider:   You may see Robbie Lis, PA-C

## 2018-12-14 ENCOUNTER — Telehealth: Payer: Self-pay | Admitting: Student

## 2018-12-14 NOTE — Telephone Encounter (Signed)
NIR.  Received fax prescription refill request for Plavix. Faxed filled prescription to [facility] in [place], [place] [phone]) at [phone]- Plavix 75 mg tablets, take one tablet by mouth once daily, dispense 30 tablets with 3 refills.   Bea Graff Louk, PA-C 12/14/2018, 12:17 PM

## 2018-12-24 ENCOUNTER — Other Ambulatory Visit: Payer: Self-pay | Admitting: *Deleted

## 2018-12-24 DIAGNOSIS — Z20822 Contact with and (suspected) exposure to covid-19: Secondary | ICD-10-CM

## 2018-12-26 LAB — NOVEL CORONAVIRUS, NAA: SARS-CoV-2, NAA: NOT DETECTED

## 2019-01-08 ENCOUNTER — Encounter: Payer: Self-pay | Admitting: Family Medicine

## 2019-01-08 ENCOUNTER — Other Ambulatory Visit: Payer: Self-pay

## 2019-01-08 DIAGNOSIS — K2270 Barrett's esophagus without dysplasia: Secondary | ICD-10-CM

## 2019-01-08 MED ORDER — PANTOPRAZOLE SODIUM 40 MG PO TBEC: 40.0000 mg | DELAYED_RELEASE_TABLET | Freq: Every day | ORAL | 2 refills | Status: DC

## 2019-01-14 ENCOUNTER — Other Ambulatory Visit: Payer: Self-pay

## 2019-01-14 ENCOUNTER — Ambulatory Visit (INDEPENDENT_AMBULATORY_CARE_PROVIDER_SITE_OTHER): Payer: PPO | Admitting: Family Medicine

## 2019-01-14 VITALS — BP 140/87 | HR 80

## 2019-01-14 DIAGNOSIS — R4586 Emotional lability: Secondary | ICD-10-CM | POA: Diagnosis not present

## 2019-01-14 NOTE — Patient Instructions (Addendum)
Options for treatment  1) Therapy -- talking to someone ---> Let me know if would like to be referred to a therapist ---> See below for other non-medication tips  2) Start a medication for anxiety and depression  -- Can take 6-8 weeks to see a difference -- Would likely start with Escitalopram at a low dose  3) Consider neurocognitive evaluation to figure out if this is mood related or may be some early signs of dementia -- Could also consider neurology evaluation     How to help anxiety - without medication.   1) Regular Exercise - walking, jogging, cycling, dancing, strength training --> Yoga has been shown in research to reduce depression and anxiety -- with even just one hour long session per week  2)  Begin a Mindfulness/Meditation practice -- this can take a little as 3 minutes and is helpful for all kinds of mood issues -- You can find resources in books -- Or you can download apps like  ---- Headspace App (which currently has free content called "Weathering the Storm") ---- Calm (which has a few free options)  ---- Insignt Timer ---- Stop, Breathe & Think  # With each of these Apps - you should decline the "start free trial" offer and as you search through the App should be able to access some of their free content. You can also chose to pay for the content if you find one that works well for you.   # Many of them also offer sleep specific content which may help with insomnia  3) Healthy Diet -- Avoid or decrease Caffeine -- Avoid or decrease Alcohol -- Drink plenty of water, have a balanced diet -- Avoid cigarettes and marijuana (as well as other recreational drugs)  4) Consider contacting a professional therapist  -- [facility] is one option. Call [phone] -- Or you can check out www.psychologytoday.com -- you can read bios of therapists and see if they accept insurance -- Check with your insurance to see if you have coverage and who may take  your insurance

## 2019-01-14 NOTE — Progress Notes (Addendum)
I connected with John Salazar on 01/14/19 at  8:00 AM EST by video and verified that I am speaking with the correct person using two identifiers.   I discussed the limitations, risks, security and privacy concerns of performing an evaluation and management service by video and the availability of in person appointments. I also discussed with the patient that there may be a patient responsible charge related to this service. The patient expressed understanding and agreed to proceed.  Patient location: Home Provider Location: [place] Participants: Lesleigh Noe and John Salazar   Subjective:     John Salazar is a 73 y.o. male presenting for mood change (irritable, less motivated.)     HPI   #fatigued - has a lot happen since he was last at the office - had to have a heart catheter done - and blocked vessels -- and started new medication - now on clopidogrel  - is getting more fatigued - called the cardiologist back and asked about the fatigue - was told the metoprolol can cause this > stopped the medication and drowsiness improved  - noticed that now he gets up in the morning and has to drag himself out of bed - will get up to take the dogs out several times overnight - getting up to brush his teeth, take a shower - still wants to be around people, but 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  - feels like he is managing well with his declining healthy - usually high energy - has had anxiety in the past -- has tried medication to calm him down and they did the opposite - tried buspion and bad reaction - tried anxiety medication and could not tolerate it due to making him too relaxed to do his job -- would take 1/2 tablet and use it as a sleeping pill  - overall feels well  - 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 Packs/day: 2.00  . Years: 25.00  . Pack years: 50.00  . Types: Cigarettes  . Quit date: 03/10/1996  . Years since quitting: 22.8  Smokeless Tobacco Never Used        Objective:   BP Readings from Last 3 Encounters:  01/14/19 140/87  11/28/18 124/80  11/15/18 126/85   Wt Readings from Last 3 Encounters:  11/28/18 164 lb (74.4 kg)  11/15/18 160 lb (72.6 kg)  11/12/18 164 lb 12.8 oz (74.8 kg)    BP 140/87 Comment: per patient  Pulse 80 Comment: per patient   Physical Exam Constitutional:      Appearance: Normal appearance. He is not ill-appearing.  HENT:     Head: Normocephalic and atraumatic.     Right Ear: External ear normal.     Left Ear: External ear normal.  Eyes:     Conjunctiva/sclera: Conjunctivae normal.  Pulmonary:     Effort: Pulmonary effort is normal. No respiratory distress.  Neurological:     Mental Status: He is alert. Mental  status is at baseline.  Psychiatric:        Mood and Affect: Mood normal.        Behavior: Behavior normal.        Thought Content: Thought content normal.        Judgment: Judgment normal.    GAD 7 : Generalized Anxiety Score 01/14/2019  Nervous, Anxious, on Edge 1  Control/stop worrying 0  Worry too much - different things 1  Trouble relaxing 1  Restless 0  Easily annoyed or irritable 1  Afraid - awful might happen 0  Total GAD 7 Score 4  Anxiety Difficulty Somewhat difficult   Depression screen [facility] 2/9 01/14/2019 01/08/2018  Decreased Interest 1 0  Down, Depressed, Hopeless 1 0  PHQ - 2 Score 2 0  Altered sleeping 0 -  Tired, decreased energy 2 -  Change in appetite 2 -  Feeling bad or failure about yourself  0 -  Trouble concentrating 0  -  Moving slowly or fidgety/restless 1 -  Suicidal thoughts 0 -  PHQ-9 Score 7 -  Difficult doing work/chores Somewhat difficult -            Assessment & Plan:   Problem List Items Addressed This Visit      Other   Mood changes - Primary    PHQ-9 with mild depression and GAD only slightly elevated. Discussed several options - could try therapy, SSRI or given vascular hx could consider neurology or neurocognitive evaluation to see if it may be signs of dementia. Pt not sure what he would like to do and will consider all the options.           Return if symptoms worsen or fail to improve.  Lesleigh Noe, MD

## 2019-01-14 NOTE — Assessment & Plan Note (Signed)
PHQ-9 with mild depression and GAD only slightly elevated. Discussed several options - could try therapy, SSRI or given vascular hx could consider neurology or neurocognitive evaluation to see if it may be signs of dementia. Pt not sure what he would like to do and will consider all the options.

## 2019-01-15 ENCOUNTER — Other Ambulatory Visit: Payer: Self-pay | Admitting: *Deleted

## 2019-01-15 ENCOUNTER — Encounter: Payer: Self-pay | Admitting: Family Medicine

## 2019-01-15 DIAGNOSIS — F320 Major depressive disorder, single episode, mild: Secondary | ICD-10-CM

## 2019-01-15 DIAGNOSIS — R4586 Emotional lability: Secondary | ICD-10-CM

## 2019-01-15 MED ORDER — ISOSORBIDE MONONITRATE ER 30 MG PO TB24: 30.0000 mg | ORAL_TABLET | Freq: Every day | ORAL | 3 refills | Status: DC

## 2019-01-15 MED ORDER — ESCITALOPRAM OXALATE 5 MG PO TABS: 5.0000 mg | ORAL_TABLET | Freq: Every day | ORAL | 1 refills | Status: DC

## 2019-01-16 NOTE — Progress Notes (Signed)
Virtual Visit via Video Note   This visit type was conducted due to national recommendations for restrictions regarding the COVID-19 Pandemic (e.g. social distancing) in an effort to limit this patient's exposure and mitigate transmission in our community.  Due to his co-morbid illnesses, this patient is at least at moderate risk for complications without adequate follow up.  This format is felt to be most appropriate for this patient at this time.  All issues noted in this document were discussed and addressed.  A limited physical exam was performed with this format.  Please refer to the patient's chart for his consent to telehealth for John Salazar.   Date:  01/17/2019   ID:  John Salazar, [place] Dec 08, 1945, MRN [id number]  Patient Location: Home Provider Location: Home  PCP:  Lesleigh Noe, MD  Cardiologist:  Dorris Carnes, MD  Electrophysiologist:  None   Evaluation Performed:  Follow-Up Visit  Chief Complaint:  6 weeks follow up   History of Present Illness:    John Salazar is a 73 y.o. male with hx of HTN, HLD, GERD, s/p stenting ofright ICA for dizziness and prior tobacco smoking presents for follow up.   Seen by me 11/2018 for possible angina. Added BB. Follow up cath showed severe single-vessel coronary artery disease involving a branch of OM1 (80% ostial stenosis) and distal LCx/OM2 (70%). The affected vessels/branches are small and not well-suited to PCI (vessel size 2 mm or less). Recommended medical therapy. He was already on DAPT with ASA and plavix for vascular disease. Recently increased Imdur to 30mg  for recurrent discomfort.   Seen for follow up.  He did not tolerated metoprolol 12.5 mg twice daily secondary to severe fatigue.  His energy is gradually improving.  He increased his Imdur yesterday.  Still feels some sort of lack of energy with extreme exertion with intermittent chest tightness.  One time he did require sublingual nitroglycerin.  Felt weird due to drop  down in blood pressure.  He denies shortness of breath, palpitation, syncope, orthopnea or PND.  The patient does not  have symptoms concerning for COVID-19 infection (fever, chills, cough, or new shortness of breath).    Past Medical History:  Diagnosis Date  . Anxiety   . BPH (benign prostatic hyperplasia)   . Chronic kidney disease    stage 2  . Colon polyps   . Erectile dysfunction   . GERD (gastroesophageal reflux disease)   . History of stomach ulcers   . Hypercholesteremia   . Hypertension   . 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 APPENDECTOMY  1970  . CATARACT EXTRACTION W/ INTRAOCULAR LENS  IMPLANT, BILATERAL    . COLONOSCOPY  10/2012  . ESOPHAGOGASTRODUODENOSCOPY ENDOSCOPY  10/2012  . HERNIA REPAIR    . IR ANGIO INTRA EXTRACRAN SEL COM CAROTID INNOMINATE BILAT MOD SED  08/09/2017  . IR ANGIO INTRA EXTRACRAN SEL COM CAROTID INNOMINATE BILAT MOD SED  11/08/2018  . IR ANGIO VERTEBRAL SEL VERTEBRAL BILAT MOD SED  08/09/2017  . IR ANGIO VERTEBRAL SEL VERTEBRAL BILAT MOD SED  11/08/2018  . IR INTRAVSC STENT CERV CAROTID W/EMB-PROT MOD SED INCL ANGIO  08/23/2017  . LEFT HEART CATH AND CORONARY ANGIOGRAPHY N/A 11/15/2018   Procedure: LEFT HEART CATH AND CORONARY ANGIOGRAPHY;  Surgeon: Nelva Bush, MD;  Location: [facility];  Service: Cardiovascular;  Laterality: N/A;  . [place]  20 years ago, enlarged prostate  . RADIOLOGY WITH ANESTHESIA N/A 08/23/2017   Procedure: IR WITH ANESTHESIA STENT PLACEMENT;  Surgeon: Luanne Bras, MD;  Location: [facility];  Service: Radiology;  Laterality: N/A;     Current Meds  Medication Sig  . acetaminophen (TYLENOL) 500 MG tablet Take 1,000 mg by mouth every 6 (six) hours as needed for headache.   Marland Kitchen aspirin EC 81 MG tablet Take 81 mg by mouth daily.  . clopidogrel (PLAVIX) 75 MG tablet Take 1 tablet (75 mg total) by mouth daily.  . diphenhydrAMINE (BENADRYL) 25 MG tablet  Take 12.5 mg by mouth daily as needed for allergies.  Marland Kitchen escitalopram (LEXAPRO) 5 MG tablet Take 1 tablet (5 mg total) by mouth daily.  . fluticasone (FLONASE) 50 MCG/ACT nasal spray Place 1 spray into both nostrils daily as needed for allergies or rhinitis.  Marland Kitchen isosorbide mononitrate (IMDUR) 30 MG 24 hr tablet Take 1 tablet (30 mg total) by mouth daily.  Marland Kitchen losartan (COZAAR) 100 MG tablet TAKE 1 TABLET ONCE DAILY.  . nitroGLYCERIN (NITROSTAT) 0.4 MG SL tablet Place 1 tablet (0.4 mg total) under the tongue every 5 (five) minutes as needed for chest pain.  . pantoprazole (PROTONIX) 40 MG tablet Take 1 tablet (40 mg total) by mouth daily.  . rosuvastatin (CRESTOR) 20 MG tablet Take 1 tablet (20 mg total) by mouth daily.  . tamsulosin (FLOMAX) 0.4 MG CAPS capsule Take 0.4 mg by mouth daily after supper.      Allergies:   Bee venom and Penicillins   Social History   Tobacco Use  . Smoking status: Former Smoker    Packs/day: 2.00    Years: 25.00    Pack years: 50.00    Types: Cigarettes    Quit date: 03/10/1996    Years since quitting: 22.8  . Smokeless tobacco: Never Used  Substance Use Topics  . Alcohol use: No    Alcohol/week: 0.0 standard drinks    Comment: in the past drank, quit 1993  . Drug use: No     Family Hx: The patient's family history includes Aneurysm (age of onset: 21) in his father; Stroke (age of onset: 29) in his mother.  ROS:   Please see the history of present illness.    All other systems reviewed and are negative.  Prior CV studies:   The following studies were reviewed today:  LEFT HEART CATH AND CORONARY ANGIOGRAPHY  Conclusion  Conclusions: 1. Severe single-vessel coronary artery disease involving a branch of OM1 (80% ostial stenosis) and distal LCx/OM2 (70%).  The affected vessels/branches are small and not well-suited to PCI (vessel size 2 mm or less). 2. Mild to moderate, non-obstructive coronary artery disease involving the LMCA, LAD, and RCA. 3.  Normal left ventricular filling pressure.  Recommendations: 1. Aggressive medical therapy and secondary prevention.  I will add isosorbide mononitrate 15 mg daily, to be escalated as tolerated. 2. Continue antiplatelet and statin therapy. 3. Close outpatient follow-up with Dr. Elon Jester.    Diagnostic Dominance: Right  I  Labs/Other Tests and Data Reviewed:    EKG:  No ECG reviewed.  Recent Labs: 04/09/2018: ALT 17 11/08/2018: Hemoglobin 15.1; Platelets 155 11/15/2018: BUN 14; Creatinine, Ser 1.37; Potassium 4.1; Sodium 142   Recent Lipid Panel Lab Results  Component Value Date/Time   CHOL 136 04/09/2018 09:59 AM   TRIG 111.0 04/09/2018 09:59 AM   HDL 58.40 04/09/2018 09:59 AM   CHOLHDL 2 04/09/2018 09:59 AM   LDLCALC 55  04/09/2018 09:59 AM    Wt Readings from Last 3 Encounters:  01/17/19 170 lb (77.1 kg)  11/28/18 164 lb (74.4 kg)  11/15/18 160 lb (72.6 kg)     Objective:    Vital Signs:  BP 130/77   Pulse 90   Ht 5\' 9"  (1.753 m)   Wt 170 lb (77.1 kg)   BMI 25.10 kg/m    VITAL SIGNS:  reviewed GEN:  no acute distress EYES:  sclerae anicteric, EOMI - Extraocular Movements Intact RESPIRATORY:  normal respiratory effort, symmetric expansion CARDIOVASCULAR:  no peripheral edema SKIN:  no rash, lesions or ulcers. MUSCULOSKELETAL:  no obvious deformities. NEURO:  alert and oriented x 3, no obvious focal deficit PSYCH:  normal affect  ASSESSMENT & PLAN:    1. CAD His energy and chest discomfort has been improved but not at baseline yet.  Continue dual antiplatelet therapy with aspirin and Plavix.  I have recommended to continue Imdur 30 mg for few days, if still not at baseline he will increase to 45 mg daily and let us know.  2. HLD - 04/09/2018: Cholesterol 136; HDL 58.40; LDL Cholesterol 55; Triglycerides 111.0; VLDL 22.2  Continue statin. -LDL goal less than 70  3. HTN - BP stable today on losartan and Imdur.  Reports intermittent fluctuating blood  pressure.  No change made today.  COVID-19 Education: The signs and symptoms of COVID-19 were discussed with the patient and how to seek care for testing (follow up with PCP or arrange E-visit).  The importance of social distancing was discussed today.  Time:   Today, I have spent 16 minutes with the patient with telehealth technology discussing the above problems.     Medication Adjustments/Labs and Tests Ordered: Current medicines are reviewed at length with the patient today.  Concerns regarding medicines are outlined above.   Tests Ordered: No orders of the defined types were placed in this encounter.   Medication Changes: No orders of the defined types were placed in this encounter.   Follow Up:  Either In Person or Virtual in 4 month(s)  Signed, Leanor Kail, [place]  01/17/2019 10:45 AM    [place]

## 2019-01-17 ENCOUNTER — Encounter: Payer: Self-pay | Admitting: Physician Assistant

## 2019-01-17 ENCOUNTER — Other Ambulatory Visit: Payer: Self-pay

## 2019-01-17 ENCOUNTER — Telehealth (INDEPENDENT_AMBULATORY_CARE_PROVIDER_SITE_OTHER): Payer: PPO | Admitting: Physician Assistant

## 2019-01-17 VITALS — BP 130/77 | HR 90 | Ht 69.0 in | Wt 170.0 lb

## 2019-01-17 DIAGNOSIS — E782 Mixed hyperlipidemia: Secondary | ICD-10-CM

## 2019-01-17 DIAGNOSIS — I2510 Atherosclerotic heart disease of native coronary artery without angina pectoris: Secondary | ICD-10-CM | POA: Diagnosis not present

## 2019-01-17 DIAGNOSIS — I10 Essential (primary) hypertension: Secondary | ICD-10-CM

## 2019-01-17 NOTE — Patient Instructions (Signed)
Medication Instructions:  Your physician recommends that you continue on your current medications as directed. Please refer to the Current Medication list given to you today.  *If you need a refill on your cardiac medications before your next appointment, please call your pharmacy*  Lab Work: None ordered   If you have labs (blood work) drawn today and your tests are completely normal, you will receive your results only by: Marland Kitchen MyChart Message (if you have MyChart) OR . A paper copy in the mail If you have any lab test that is abnormal or we need to change your treatment, we will call you to review the results.  Testing/Procedures: None ordered   Follow-Up: You are scheduled for a virtual visit with Dr. Harrington Challenger on 04/22/2019 @ 9:00 AM, Please have your vitals (blood pressure, heart rate, weight etc) and medication list ready 10-15 mins prior to appointment    Other Instructions None

## 2019-02-11 ENCOUNTER — Encounter: Payer: Self-pay | Admitting: Family Medicine

## 2019-02-11 ENCOUNTER — Other Ambulatory Visit (INDEPENDENT_AMBULATORY_CARE_PROVIDER_SITE_OTHER): Payer: PPO

## 2019-02-11 DIAGNOSIS — T466X5A Adverse effect of antihyperlipidemic and antiarteriosclerotic drugs, initial encounter: Secondary | ICD-10-CM

## 2019-02-11 DIAGNOSIS — M791 Myalgia, unspecified site: Secondary | ICD-10-CM

## 2019-02-11 LAB — TSH: TSH: 1.11 u[IU]/mL (ref 0.35–4.50)

## 2019-02-11 LAB — VITAMIN D 25 HYDROXY (VIT D DEFICIENCY, FRACTURES): VITD: 31.7 ng/mL (ref 30.00–100.00)

## 2019-02-12 ENCOUNTER — Encounter: Payer: Self-pay | Admitting: Family Medicine

## 2019-02-25 MED ORDER — ISOSORBIDE MONONITRATE ER 60 MG PO TB24: 60.0000 mg | ORAL_TABLET | Freq: Every day | ORAL | 3 refills | Status: DC

## 2019-02-27 ENCOUNTER — Ambulatory Visit (INDEPENDENT_AMBULATORY_CARE_PROVIDER_SITE_OTHER): Payer: PPO | Admitting: Family Medicine

## 2019-02-27 ENCOUNTER — Telehealth: Payer: Self-pay

## 2019-02-27 ENCOUNTER — Other Ambulatory Visit: Payer: Self-pay

## 2019-02-27 ENCOUNTER — Encounter: Payer: Self-pay | Admitting: Family Medicine

## 2019-02-27 VITALS — BP 122/69 | HR 94

## 2019-02-27 DIAGNOSIS — R0683 Snoring: Secondary | ICD-10-CM | POA: Diagnosis not present

## 2019-02-27 DIAGNOSIS — R400 Somnolence: Secondary | ICD-10-CM | POA: Diagnosis not present

## 2019-02-27 DIAGNOSIS — F320 Major depressive disorder, single episode, mild: Secondary | ICD-10-CM | POA: Diagnosis not present

## 2019-02-27 DIAGNOSIS — R4586 Emotional lability: Secondary | ICD-10-CM | POA: Diagnosis not present

## 2019-02-27 DIAGNOSIS — I10 Essential (primary) hypertension: Secondary | ICD-10-CM

## 2019-02-27 DIAGNOSIS — F325 Major depressive disorder, single episode, in full remission: Secondary | ICD-10-CM | POA: Insufficient documentation

## 2019-02-27 MED ORDER — ESCITALOPRAM OXALATE 5 MG PO TABS: 5.0000 mg | ORAL_TABLET | Freq: Every day | ORAL | 3 refills | Status: DC

## 2019-02-27 NOTE — Assessment & Plan Note (Signed)
Pt with daytimes sleepiness and has had some medication changes w/o improvement. Also notes that he snores. Discussed possible sleep apnea and given HTN and other risk factors is reasonable to rule this out. Referral placed

## 2019-02-27 NOTE — Telephone Encounter (Signed)
[facility] Night - Client Nonclinical Telephone Record AccessNurse Client [place] Night - Client Client Site [place] Physician [name]- MD Contact Type Call Who Is Calling Patient / Member / Family / Caregiver Caller Name [place] Phone Number [phone] Call Type Message Only Information Provided Reason for Call Returning a Call from the Office Initial [place] states that he missed a call from [place]. Time Disposition Final User 02/27/2019 8:00:23 AM General Information Provided Yes Loyal Buba Call Closed By: Loyal Buba Transaction Date/Time: 02/27/2019 7:59:35 AM (ET)

## 2019-02-27 NOTE — Telephone Encounter (Signed)
Already spoke with patient. I called to get note started for virtual visit this morning with Dr Einar Pheasant.

## 2019-02-27 NOTE — Assessment & Plan Note (Signed)
Symptoms improved on lexapro. Cont medication. Return annually.

## 2019-02-27 NOTE — Assessment & Plan Note (Signed)
BP labile at home. Discussed checking after 5 minutes of sitting down. If still elevated, may consider adding very low dose of amlodipine - though was previously getting lows and cardiology just added IMDUR.

## 2019-02-27 NOTE — Progress Notes (Signed)
I connected with John Salazar on 02/27/19 at  8:00 AM EST by video and verified that I am speaking with the correct person using two identifiers.   I discussed the limitations, risks, security and privacy concerns of performing an evaluation and management service by video and the availability of in person appointments. I also discussed with the patient that there may be a patient responsible charge related to this service. The patient expressed understanding and agreed to proceed.  Patient location: Home Provider Location: [place] Participants: John Salazar and John Salazar   Subjective:     John Salazar is a 74 y.o. male presenting for Medication Management     HPI  #Mood Changes - was started on escitalopram - feels like over the last 6 weeks he is doing better and more tolerable to other things - less grouchy - does feel sleepy - taking it at night, gets him to sleep well - has a new puppy so he has to get up to take the dog - gets 6-7 hours of sleep - if he rests in the recliner during the day he will fall asleep - not sure if he snores - maybe if he sleeps on his back - wife has not noticed him stop breathing overnight - daytime sleepiness was happening before - contacted his heart doctor about this  - no other side effects w/ the medication - did stop a medication that the cardiologist said could be contributing to his sleepiness  Cardiologists prescribed Imdur - and noticed that he would still feel some tightness in chest with lower doses - recently increased to 60 mg  #HTN - still fluctuating at home - bp this morning - had 130/79 before breakfast, 148/91 when he first got up - last night 157/92 - 173/95 - technique - sitting for about 1 minute - taking losartan 100 mg and cardiology IMDUR 60 mg - will occasionally take 1/3 of tablet  - took an amlodipine   Review of Systems   Social History   Tobacco Use  Smoking Status Former  Smoker  . Packs/day: 2.00  . Years: 25.00  . Pack years: 50.00  . Types: Cigarettes  . Quit date: 03/10/1996  . Years since quitting: 22.9  Smokeless Tobacco Never Used        Objective:   BP Readings from Last 3 Encounters:  02/27/19 122/69  01/17/19 130/77  01/14/19 140/87   Wt Readings from Last 3 Encounters:  01/17/19 170 lb (77.1 kg)  11/28/18 164 lb (74.4 kg)  11/15/18 160 lb (72.6 kg)    BP 122/69 Comment: per patient  Pulse 94 Comment: per patient   Physical Exam Constitutional:      Appearance: Normal appearance. He is not ill-appearing.  HENT:     Head: Normocephalic and atraumatic.     Right Ear: External ear normal.     Left Ear: External ear normal.  Eyes:     Conjunctiva/sclera: Conjunctivae normal.  Pulmonary:     Effort: Pulmonary effort is normal. No respiratory distress.  Neurological:     Mental Status: He is alert. Mental status is at baseline.  Psychiatric:        Mood and Affect: Mood normal.        Behavior: Behavior normal.        Thought Content: Thought content normal.        Judgment: Judgment normal.  Assessment & Plan:   Problem List Items Addressed This Visit      Cardiovascular and Mediastinum   Benign essential HTN    BP labile at home. Discussed checking after 5 minutes of sitting down. If still elevated, may consider adding very low dose of amlodipine - though was previously getting lows and cardiology just added IMDUR.         Other   Mood changes   Relevant Medications   escitalopram (LEXAPRO) 5 MG tablet   Depression, major, single episode, mild ([place]) - Primary    Symptoms improved on lexapro. Cont medication. Return annually.       Relevant Medications   escitalopram (LEXAPRO) 5 MG tablet   Snoring    Pt with daytimes sleepiness and has had some medication changes w/o improvement. Also notes that he snores. Discussed possible sleep apnea and given HTN and other risk factors is reasonable to rule  this out. Referral placed      Relevant Orders   Ambulatory referral to Pulmonology    Other Visit Diagnoses    Daytime sleepiness       Relevant Orders   Ambulatory referral to Pulmonology       Return in about 1 week (around 03/06/2019) for Mychart BP.  John Noe, MD

## 2019-03-12 ENCOUNTER — Other Ambulatory Visit: Payer: Self-pay | Admitting: Family Medicine

## 2019-03-12 NOTE — Telephone Encounter (Signed)
This medication has not been filled by Dr Einar Pheasant before per our record. Will let Dr Einar Pheasant review

## 2019-03-13 ENCOUNTER — Telehealth: Payer: Self-pay | Admitting: Internal Medicine

## 2019-03-13 NOTE — Telephone Encounter (Signed)
New message   1. What dental office are you calling from? [facility]  2. What is your office phone number? [phone]  3. What is your fax number? [phone]  4. What type of procedure is the patient having performed?dental extraction   5. What date is procedure scheduled or is the patient there now? 03/19/19 (if the patient is at the dentist's office question goes to their cardiologist if he/she is in the office.  If not, question should go to the DOD).   6. What is your question (ex. Antibiotics prior to procedure, holding medication-we need to know how long dentist wants pt to hold med)? Plavix held 5 days

## 2019-03-13 NOTE — Telephone Encounter (Signed)
   Primary Cardiologist: Dorris Carnes, MD  Chart reviewed as part of pre-operative protocol coverage. Simple dental extractions are considered low risk procedures per guidelines and generally do not require any specific cardiac clearance. It is also generally accepted that for simple extractions and dental cleanings, there is no need to interrupt blood thinner therapy.   SBE prophylaxis is not required for the patient.  I will route this recommendation to the requesting party via Epic fax function and remove from pre-op pool.  Please call with questions.  Darreld Mclean, PA-C 03/13/2019, 10:11 AM

## 2019-03-13 NOTE — Telephone Encounter (Signed)
I s/w the dental office and confirmed pt is only having 1 tooth being extracted. I will send new note to pre op team for further evaluation. Will send clearance once cleared.

## 2019-03-13 NOTE — Telephone Encounter (Signed)
Pre-op covering staff, can we clarify how many teeth patient is scheduled to have extracted? Typically, we don't recommend holding blood thinners for simple extractions.  Thank you!

## 2019-03-14 ENCOUNTER — Telehealth (HOSPITAL_COMMUNITY): Payer: Self-pay | Admitting: Radiology

## 2019-03-14 NOTE — Telephone Encounter (Signed)
Received a VM from pt's dentist office asking if he could hold his blood thinner medication for a dental procedure. Sent message to Ascencion Dike to have Deveshwar decide. JM

## 2019-03-19 ENCOUNTER — Telehealth (HOSPITAL_COMMUNITY): Payer: Self-pay | Admitting: Radiology

## 2019-03-19 NOTE — Telephone Encounter (Signed)
Pt's dentist office called to see if he could hold Plavix for a dental procedure. Per Dr. Estanislado Pandy pt can hold Plavix for procedure and will restart it ASAP following his treatment. He is to continued taking his Aspirin throughout. Melanie at Dr. Theodosia Blender office understood these instructions. JM

## 2019-03-21 ENCOUNTER — Encounter: Payer: Self-pay | Admitting: Pulmonary Disease

## 2019-03-21 ENCOUNTER — Other Ambulatory Visit: Payer: Self-pay

## 2019-03-21 ENCOUNTER — Ambulatory Visit: Payer: PPO | Admitting: Pulmonary Disease

## 2019-03-21 VITALS — BP 120/70 | HR 86 | Temp 97.7°F | Ht 69.0 in | Wt 169.4 lb

## 2019-03-21 DIAGNOSIS — R0683 Snoring: Secondary | ICD-10-CM

## 2019-03-21 NOTE — Patient Instructions (Signed)
Concern for obstructive sleep apnea Daytime sleepiness  Angina  We will schedule you for home sleep study  I will see you back in the office in about 6 to 8 weeks  Sleep Apnea Sleep apnea is a condition in which breathing pauses or becomes shallow during sleep. Episodes of sleep apnea usually last 10 seconds or longer, and they may occur as many as 20 times an hour. Sleep apnea disrupts your sleep and keeps your body from getting the rest that it needs. This condition can increase your risk of certain health problems, including:  Heart attack.  Stroke.  Obesity.  Diabetes.  Heart failure.  Irregular heartbeat. What are the causes? There are three kinds of sleep apnea:  Obstructive sleep apnea. This kind is caused by a blocked or collapsed airway.  Central sleep apnea. This kind happens when the part of the brain that controls breathing does not send the correct signals to the muscles that control breathing.  Mixed sleep apnea. This is a combination of obstructive and central sleep apnea. The most common cause of this condition is a collapsed or blocked airway. An airway can collapse or become blocked if:  Your throat muscles are abnormally relaxed.  Your tongue and tonsils are larger than normal.  You are overweight.  Your airway is smaller than normal. What increases the risk? You are more likely to develop this condition if you:  Are overweight.  Smoke.  Have a smaller than normal airway.  Are elderly.  Are male.  Drink alcohol.  Take sedatives or tranquilizers.  Have a family history of sleep apnea. What are the signs or symptoms? Symptoms of this condition include:  Trouble staying asleep.  Daytime sleepiness and tiredness.  Irritability.  Loud snoring.  Morning headaches.  Trouble concentrating.  Forgetfulness.  Decreased interest in sex.  Unexplained sleepiness.  Mood swings.  Personality changes.  Feelings of  depression.  Waking up often during the night to urinate.  Dry mouth.  Sore throat. How is this diagnosed? This condition may be diagnosed with:  A medical history.  A physical exam.  A series of tests that are done while you are sleeping (sleep study). These tests are usually done in a sleep lab, but they may also be done at home. How is this treated? Treatment for this condition aims to restore normal breathing and to ease symptoms during sleep. It may involve managing health issues that can affect breathing, such as high blood pressure or obesity. Treatment may include:  Sleeping on your side.  Using a decongestant if you have nasal congestion.  Avoiding the use of depressants, including alcohol, sedatives, and narcotics.  Losing weight if you are overweight.  Making changes to your diet.  Quitting smoking.  Using a device to open your airway while you sleep, such as: ? An oral appliance. This is a custom-made mouthpiece that shifts your lower jaw forward. ? A continuous positive airway pressure (CPAP) device. This device blows air through a mask when you breathe out (exhale). ? A nasal expiratory positive airway pressure (EPAP) device. This device has valves that you put into each nostril. ? A bi-level positive airway pressure (BPAP) device. This device blows air through a mask when you breathe in (inhale) and breathe out (exhale).  Having surgery if other treatments do not work. During surgery, excess tissue is removed to create a wider airway. It is important to get treatment for sleep apnea. Without treatment, this condition can lead to:  High blood pressure.  Coronary artery disease.  In men, an inability to achieve or maintain an erection (impotence).  Reduced thinking abilities. Follow these instructions at home: Lifestyle  Make any lifestyle changes that your health care provider recommends.  Eat a healthy, well-balanced diet.  Take steps to lose weight  if you are overweight.  Avoid using depressants, including alcohol, sedatives, and narcotics.  Do not use any products that contain nicotine or tobacco, such as cigarettes, e-cigarettes, and chewing tobacco. If you need help quitting, ask your health care provider. General instructions  Take over-the-counter and prescription medicines only as told by your health care provider.  If you were given a device to open your airway while you sleep, use it only as told by your health care provider.  If you are having surgery, make sure to tell your health care provider you have sleep apnea. You may need to bring your device with you.  Keep all follow-up visits as told by your health care provider. This is important. Contact a health care provider if:  The device that you received to open your airway during sleep is uncomfortable or does not seem to be working.  Your symptoms do not improve.  Your symptoms get worse. Get help right away if:  You develop: ? Chest pain. ? Shortness of breath. ? Discomfort in your back, arms, or stomach.  You have: ? Trouble speaking. ? Weakness on one side of your body. ? Drooping in your face. These symptoms may represent a serious problem that is an emergency. Do not wait to see if the symptoms will go away. Get medical help right away. Call your local emergency services (911 in the [place] Do not drive yourself to the hospital. Summary  Sleep apnea is a condition in which breathing pauses or becomes shallow during sleep.  The most common cause is a collapsed or blocked airway.  The goal of treatment is to restore normal breathing and to ease symptoms during sleep. This information is not intended to replace advice given to you by your health care provider. Make sure you discuss any questions you have with your health care provider. Document Revised: 07/11/2018 Document Reviewed: 09/19/2017 Elsevier Patient Education  [place].

## 2019-03-21 NOTE — Progress Notes (Signed)
Subjective:    Patient ID: John Salazar, male    DOB: 09/24/1945, 74 y.o.   MRN: [id number]  Patient has been seen for concern for obstructive sleep apnea, daytime sleepiness  He stated started having anginal symptoms recently History of vascular disease Was started some new medications that was making him more sleepy than usual Medication adjustments have helped a little  He does feel sleepy during the day Sleep is sometimes nonrestorative, wakes up early but will be able to fall asleep soon after relaxing for an hour even early in the morning He does take occasional naps during the day  Usually goes to bed between 9 and 10 PM Takes him about 5 to 10 minutes to fall asleep Wakes up once or twice His weight has been relatively stable  Denies headaches in the mornings Occasional dryness of his mouth in the mornings Memory is fair, some forgetfulness recently No family history of obstructive sleep apnea known to him or anyone with severe snoring    Has a pet dog that was getting him up a couple times a night to go to the bathroom His sleep quality is also affected by his GERD, Barrett's esophagus Sleeps in a recliner, tries to sleep on his side    Past Medical History:  Diagnosis Date  . Anxiety   . BPH (benign prostatic hyperplasia)   . Chronic kidney disease    stage 2  . Colon polyps   . Erectile dysfunction   . GERD (gastroesophageal reflux disease)   . History of stomach ulcers   . Hypercholesteremia   . Hypertension   . Positive TB test    in the past   Social History   Socioeconomic History  . Marital status: Married    Spouse name: John Salazar  . Number of children: 2  . Years of education: 52  . Highest education level: Not on file  Occupational History  . Occupation: Oceanographer.  Tobacco Use  . Smoking status: Former Smoker    Packs/day: 2.00    Years: 25.00    Pack years: 50.00    Types: Cigarettes    Quit date: 03/10/1996    Years  since quitting: 23.0  . Smokeless tobacco: Never Used  Substance and Sexual Activity  . Alcohol use: No    Alcohol/week: 0.0 standard drinks    Comment: in the past drank, quit 1993  . Drug use: No  . Sexual activity: Not Currently  Other Topics Concern  . Not on file  Social History Narrative   Lives with John Salazar (Wife) - together for 25 years   Children - John Salazar and John Salazar -- one is a Marine scientist in [employer] and other works for [employer] - 18, lives in [place]   Enjoys - playing golf, goes to [employer] and sponsors people as well, church   Support - family, good friends in [place]   Exercise - walks the dog, runs up hills   Diet - improved from before, tries to drink water - low salt or no salt   Social Determinants of Radio broadcast assistant Strain:   . Difficulty of Paying Living Expenses: Not on file  Food Insecurity:   . Worried About Charity fundraiser in the Last Year: Not on file  . Ran Out of Food in the Last Year: Not on file  Transportation Needs:   . Lack of Transportation (Medical): Not on file  . Lack  of Transportation (Non-Medical): Not on file  Physical Activity:   . Days of Exercise per Week: Not on file  . Minutes of Exercise per Session: Not on file  Stress:   . Feeling of Stress : Not on file  Social Connections:   . Frequency of Communication with Friends and Family: Not on file  . Frequency of Social Gatherings with Friends and Family: Not on file  . Attends Religious Services: Not on file  . Active Member of Clubs or Organizations: Not on file  . Attends Archivist Meetings: Not on file  . Marital Status: Not on file  Intimate Partner Violence:   . Fear of Current or Ex-Partner: Not on file  . Emotionally Abused: Not on file  . Physically Abused: Not on file  . Sexually Abused: Not on file   Family History  Problem Relation Age of Onset  . Stroke Mother 16  . Aneurysm Father 80   Review of Systems  Respiratory: Positive for  shortness of breath.   Musculoskeletal: Positive for arthralgias.  Psychiatric/Behavioral: Positive for sleep disturbance.      Objective:   Physical Exam Constitutional:      Appearance: Normal appearance.  HENT:     Head: Normocephalic and atraumatic.     Nose: Nose normal.     Mouth/Throat:     Mouth: Mucous membranes are moist.     Comments: Crowded oropharynx, Mallampati 4 Eyes:     General:        Right eye: No discharge.        Left eye: No discharge.     Pupils: Pupils are equal, round, and reactive to light.  Cardiovascular:     Rate and Rhythm: Normal rate and regular rhythm.     Pulses: Normal pulses.     Heart sounds: Normal heart sounds. No murmur. No friction rub.  Pulmonary:     Effort: Pulmonary effort is normal. No respiratory distress.     Breath sounds: Normal breath sounds. No stridor. No wheezing or rhonchi.  Abdominal:     General: Abdomen is flat.  Musculoskeletal:     Cervical back: Normal range of motion and neck supple. No rigidity or tenderness.  Skin:    General: Skin is warm.  Neurological:     General: No focal deficit present.     Mental Status: He is alert.  Psychiatric:        Mood and Affect: Mood normal.    Vitals:   03/21/19 0929  BP: 120/70  Pulse: 86  Temp: 97.7 F (36.5 C)  SpO2: 98%   Results of the Epworth flowsheet 03/21/2019  Sitting and reading 2  Watching TV 0  Sitting, inactive in a public place (e.g. a theatre or a meeting) 0  As a passenger in a car for an hour without a break 0  Lying down to rest in the afternoon when circumstances permit 3  Sitting and talking to someone 0  Sitting quietly after a lunch without alcohol 3  In a car, while stopped for a few minutes in traffic 0  Total score 8   Recent cardiac cath report from 11/15/2018 noted-severe vascular disease    Assessment & Plan:  .  Moderate probability of significant obstructive sleep apnea .  Excessive daytime sleepiness .  Severe coronary artery  disease .  History of snoring  Pathophysiology of sleep disordered breathing discussed with the patient Treatment options for sleep disordered breathing discussed with the  patient  Plan: .  We will schedule the patient for a home sleep study .  Regular exercises encouraged .  Behavioral modifications to improve sleep quality discussed .  I will see him back in the office in about 2 months .  Encouraged to call with any significant concerns

## 2019-04-08 ENCOUNTER — Ambulatory Visit: Payer: PPO | Attending: Internal Medicine

## 2019-04-08 DIAGNOSIS — Z20822 Contact with and (suspected) exposure to covid-19: Secondary | ICD-10-CM

## 2019-04-10 LAB — NOVEL CORONAVIRUS, NAA: SARS-CoV-2, NAA: NOT DETECTED

## 2019-04-21 ENCOUNTER — Other Ambulatory Visit: Payer: Self-pay | Admitting: Family Medicine

## 2019-04-22 ENCOUNTER — Ambulatory Visit (INDEPENDENT_AMBULATORY_CARE_PROVIDER_SITE_OTHER): Payer: PPO | Admitting: Family Medicine

## 2019-04-22 ENCOUNTER — Encounter: Payer: Self-pay | Admitting: Family Medicine

## 2019-04-22 ENCOUNTER — Other Ambulatory Visit: Payer: Self-pay

## 2019-04-22 ENCOUNTER — Telehealth: Payer: Self-pay | Admitting: Student

## 2019-04-22 ENCOUNTER — Telehealth: Payer: PPO | Admitting: Internal Medicine

## 2019-04-22 VITALS — BP 140/80 | HR 72 | Temp 98.6°F | Ht 69.0 in | Wt 168.0 lb

## 2019-04-22 DIAGNOSIS — M170 Bilateral primary osteoarthritis of knee: Secondary | ICD-10-CM | POA: Diagnosis not present

## 2019-04-22 MED ORDER — METHYLPREDNISOLONE ACETATE 40 MG/ML IJ SUSP
80.0000 mg | Freq: Once | INTRAMUSCULAR | Status: AC
  Administered 2019-04-22: 80 mg via INTRA_ARTICULAR

## 2019-04-22 NOTE — Addendum Note (Signed)
Addended by: Carter Kitten on: 04/22/2019 12:21 PM   Modules accepted: Orders

## 2019-04-22 NOTE — Telephone Encounter (Signed)
I will make a note on order not to call pt to schedule.

## 2019-04-22 NOTE — Telephone Encounter (Signed)
NIR.  Received fax prescription refill request for Plavix. Faxed filled prescription to [facility] in [place], [place] [phone]) at [phone]- Plavix 75 mg tablets, take one tablet by mouth once daily, dispense 30 tablets with 3 refills.   Bea Graff Lakaya Tolen, PA-C 04/22/2019, 9:18 AM

## 2019-04-22 NOTE — Progress Notes (Signed)
     [name] T. [name], MD Primary Care and Sports Medicine [facility] [place], [address] Phone: [phone]  FAX: [place] - 74 y.o. male  MRN [id number]  Date of Birth: [birth year]  Visit Date: 04/22/2019  PCP: Lesleigh Noe, MD  Referred by: Lesleigh Noe, MD  Chief Complaint  Patient presents with  . Knee Pain    Bilateral knee injections    This visit occurred during the SARS-CoV-2 public health emergency.  Safety protocols were in place, including screening questions prior to the visit, additional usage of staff PPE, and extensive cleaning of exam room while observing appropriate contact time as indicated for disinfecting solutions.   Subjective:   John Salazar is a 74 y.o. very pleasant male patient with Body mass index is 24.81 kg/m. who presents with the following:  7 mo did some prior knee inj for OA   Procedure only:  Aspiration/Injection Procedure Note John Salazar 1945/02/18 Date of procedure: 04/22/2019  Procedure: Large Joint Joint Aspiration / Injection of the Right Knee Indications: Pain  Procedure Details Patient verbally consented to procedure. Risks (including potential rare risk of infection), benefits, and alternatives explained. Sterilely prepped with Chloraprep. Ethyl cholride used for anesthesia. 8 cc Lidocaine 1% mixed with 2 mL Depo-Medrol 40 mg injected using the anteromedial approach without difficulty. No complications with procedure and tolerated well. Patient had decreased pain post-injection.  Medication: 2 mL of Depo-Medrol 40 mg, equaling Depo-Medrol 80 mg total  Aspiration/Injection Procedure Note John Salazar 1945/09/12 Date of procedure: 04/22/2019  Procedure: Large Joint Aspiration / Injection of the Left Knee Indications: Pain  Procedure Details Patient verbally consented to procedure. Risks (including potential rare risk of infection),  benefits, and alternatives explained. Sterilely prepped with Chloraprep. Ethyl cholride used for anesthesia. 8 cc Lidocaine 1% mixed with 2 mL Depo-Medrol 40 mg injected using the anteromedial approach without difficulty. No complications with procedure and tolerated well. Patient had decreased pain post-injection.  Medication: 2 mL of Depo-Medrol 40 mg, equaling Depo-Medrol 80 mg total   Signed,  Fahed Morten T. Randel Hargens, MD

## 2019-04-22 NOTE — Telephone Encounter (Signed)
Okay to do so  He should let us know when he'd like it rescheduled for or call at any time to schedule

## 2019-04-23 ENCOUNTER — Encounter: Payer: Self-pay | Admitting: Family Medicine

## 2019-04-26 ENCOUNTER — Encounter: Payer: Self-pay | Admitting: Family Medicine

## 2019-05-06 ENCOUNTER — Ambulatory Visit (INDEPENDENT_AMBULATORY_CARE_PROVIDER_SITE_OTHER): Payer: PPO | Admitting: Family Medicine

## 2019-05-06 ENCOUNTER — Other Ambulatory Visit: Payer: Self-pay

## 2019-05-06 ENCOUNTER — Encounter: Payer: Self-pay | Admitting: Family Medicine

## 2019-05-06 VITALS — BP 132/78 | HR 66 | Temp 98.2°F | Ht 69.0 in | Wt 165.2 lb

## 2019-05-06 DIAGNOSIS — R4586 Emotional lability: Secondary | ICD-10-CM | POA: Diagnosis not present

## 2019-05-06 DIAGNOSIS — F320 Major depressive disorder, single episode, mild: Secondary | ICD-10-CM | POA: Diagnosis not present

## 2019-05-06 DIAGNOSIS — R109 Unspecified abdominal pain: Secondary | ICD-10-CM | POA: Diagnosis not present

## 2019-05-06 MED ORDER — ESCITALOPRAM OXALATE 10 MG PO TABS: 10.0000 mg | ORAL_TABLET | Freq: Every day | ORAL | 1 refills | Status: DC

## 2019-05-06 NOTE — Progress Notes (Signed)
Subjective:     John Salazar is a 74 y.o. male presenting for Medication Management (?? 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 Packs/day: 2.00  . Years: 25.00  . Pack years: 50.00  . Types: Cigarettes  . Quit date: 03/10/1996  . Years since quitting: 23.1  Smokeless Tobacco Never Used        Objective:    BP Readings from Last 3 Encounters:  05/06/19 132/78  04/22/19 140/80  03/21/19 120/70   Wt Readings from Last 3 Encounters:  05/06/19 165 lb 4 oz (75 kg)  04/22/19 168 lb (76.2 kg)  03/21/19 169 lb 6.4 oz (76.8 kg)    BP 132/78 (BP Location: Left Arm, Patient Position: Sitting, Cuff Size: Normal)   Pulse 66   Temp 98.2 F (36.8 C) (Temporal)   Ht 5\' 9"  (1.753 m)   Wt 165 lb 4 oz (75 kg)   SpO2 95%   BMI 24.40 kg/m    Physical Exam Constitutional:      Appearance: Normal appearance. He is not ill-appearing or diaphoretic.  HENT:     Right Ear: External ear normal.     Left Ear: External ear normal.     Nose: Nose normal.  Eyes:     General: No scleral icterus.    Extraocular Movements: Extraocular movements  intact.     Conjunctiva/sclera: Conjunctivae normal.  Cardiovascular:     Rate and Rhythm: Normal rate.  Pulmonary:     Effort: Pulmonary effort is normal.  Abdominal:     General: Abdomen is flat. Bowel sounds are normal.     Palpations: Abdomen is soft.     Tenderness: There is no abdominal tenderness.  Musculoskeletal:     Cervical back: Neck supple.  Skin:    General: Skin is warm and dry.  Neurological:     Mental Status: He is alert. Mental status is at baseline.  Psychiatric:        Mood and Affect: Mood normal.           Assessment & Plan:   Problem List Items Addressed This Visit      Other   Mood changes   Relevant Medications   escitalopram (LEXAPRO) 10 MG tablet   Depression, major, single episode, mild ([place]) - Primary    Long discussion about symptoms before/after lexapro. Pt notes some improvement, but still not back to normal. Discussed increasing lexapro to 10 mg daily. He agreed. If no significant improvement, will plan to stop the medication and reassess need and diagnosis.  Relevant Medications   escitalopram (LEXAPRO) 10 MG tablet    Other Visit Diagnoses    Abdominal discomfort         Abdominal discomfort resolved prior to office visit. Benign exam. Continue to monitor.   Return in about 3 months (around 08/06/2019).  Lesleigh Noe, MD

## 2019-05-06 NOTE — Patient Instructions (Signed)
Lets try increasing the Lexapro to 10 mg daily  MyChart in 4 weeks to let me know how you are feeling   #Consider contacting a professional therapist  -- [facility] is one option. Call [phone] -- Or you can check out www.[email or web address] -- you can read bios of therapists and see if they accept insurance -- Check with your insurance to see if you have coverage and who may take your insurance

## 2019-05-06 NOTE — Assessment & Plan Note (Signed)
Long discussion about symptoms before/after lexapro. Pt notes some improvement, but still not back to normal. Discussed increasing lexapro to 10 mg daily. He agreed. If no significant improvement, will plan to stop the medication and reassess need and diagnosis.

## 2019-05-10 ENCOUNTER — Other Ambulatory Visit: Payer: Self-pay | Admitting: Family Medicine

## 2019-05-10 DIAGNOSIS — I10 Essential (primary) hypertension: Secondary | ICD-10-CM

## 2019-05-13 ENCOUNTER — Other Ambulatory Visit: Payer: Self-pay | Admitting: Family Medicine

## 2019-05-13 DIAGNOSIS — I10 Essential (primary) hypertension: Secondary | ICD-10-CM

## 2019-05-14 NOTE — Progress Notes (Signed)
Cardiology Office Note    Date:  05/15/2019   ID:  [name], [name] 03/20/1945, MRN [id number]  PCP:  Lesleigh Noe, MD  Cardiologist:  Dr. Harrington Challenger  Chief Complaint: 4  Months follow up  History of Present Illness:   John Salazar is a 74 y.o. male medically managed CAD, HTN, HLD, GERD, s/p stenting ofright ICAfor dizziness and prior tobacco smoking presents for follow up.   Cath 11/2018 for possible angina showed severe single-vessel coronary artery disease involving a branch of OM1 (80% ostial stenosis) and distal LCx/OM2 (70%). The affected vessels/branches are small and not well-suited to PCI (vessel size 2 mm or less).Recommended medical therapy. He was already on DAPT with ASA and plavix for vascular disease.Did not tolerated BB 2nd to fatigue. Placed on Imdur and up-titrated due to recurrent chest discomfort.   Last seen by me virtually 01/2019.   Here today for follow up.  He denies chest pain, shortness of, orthopnea or PND syncope, lower extremity edema or melena.  Compliant with his medication.  Blood pressure in 140/70s.  Few days ago his systolic blood pressure went to 90s for  some reason, held losartan with improvement.   Past Medical History:  Diagnosis Date  . Anxiety   . BPH (benign prostatic hyperplasia)   . Chronic kidney disease    stage 2  . Colon polyps   . Erectile dysfunction   . GERD (gastroesophageal reflux disease)   . History of stomach ulcers   . Hypercholesteremia   . Hypertension   . 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 APPENDECTOMY  1970  . CATARACT EXTRACTION W/ INTRAOCULAR LENS  IMPLANT, BILATERAL    . COLONOSCOPY  10/2012  . ESOPHAGOGASTRODUODENOSCOPY ENDOSCOPY  10/2012  . HERNIA REPAIR    . IR ANGIO INTRA EXTRACRAN SEL COM CAROTID INNOMINATE BILAT MOD SED  08/09/2017  . IR ANGIO INTRA EXTRACRAN SEL COM CAROTID INNOMINATE BILAT MOD SED  11/08/2018  . IR ANGIO VERTEBRAL SEL  VERTEBRAL BILAT MOD SED  08/09/2017  . IR ANGIO VERTEBRAL SEL VERTEBRAL BILAT MOD SED  11/08/2018  . IR INTRAVSC STENT CERV CAROTID W/EMB-PROT MOD SED INCL ANGIO  08/23/2017  . LEFT HEART CATH AND CORONARY ANGIOGRAPHY N/A 11/15/2018   Procedure: LEFT HEART CATH AND CORONARY ANGIOGRAPHY;  Surgeon: Nelva Bush, MD;  Location: [facility];  Service: Cardiovascular;  Laterality: N/A;  . PROSTATE SURGERY  1999   20 years ago, enlarged prostate  . RADIOLOGY WITH ANESTHESIA N/A 08/23/2017   Procedure: IR WITH ANESTHESIA STENT PLACEMENT;  Surgeon: Luanne Bras, MD;  Location: [facility];  Service: Radiology;  Laterality: N/A;    Current Medications: Prior to Admission medications   Medication Sig Start Date End Date Taking? Authorizing Provider  acetaminophen (TYLENOL) 500 MG tablet Take 1,000 mg by mouth every 6 (six) hours as needed for headache.     [provider]  aspirin EC 81 MG tablet Take 81 mg by mouth daily.    [provider]  clopidogrel (PLAVIX) 75 MG tablet Take 1 tablet (75 mg total) by mouth daily. 04/13/18   Ascencion Dike, PA-C  escitalopram (LEXAPRO) 10 MG tablet Take 1 tablet (10 mg total) by mouth daily. 05/06/19   Lesleigh Noe, MD  isosorbide mononitrate (IMDUR) 60 MG 24 hr tablet Take 1 tablet (60 mg total) by mouth daily. 02/25/19 05/26/19  Leanor Kail,  PA  losartan (COZAAR) 100 MG tablet TAKE 1 TABLET ONCE DAILY. 05/13/19   Lesleigh Noe, MD  nitroGLYCERIN (NITROSTAT) 0.4 MG SL tablet Place 1 tablet (0.4 mg total) under the tongue every 5 (five) minutes as needed for chest pain. 11/15/18 11/15/19  End, Harrell Gave, MD  pantoprazole (PROTONIX) 40 MG tablet Take 1 tablet (40 mg total) by mouth daily. 01/08/19   Lesleigh Noe, MD  rosuvastatin (CRESTOR) 20 MG tablet TAKE 1 TABLET ONCE DAILY. 04/22/19   Lesleigh Noe, MD  tamsulosin (FLOMAX) 0.4 MG CAPS capsule TAKE 1 CAPSULE 30 MINUTES AFTER THE SAME MEAL EACH DAY ONCE DAY. 03/12/19   Lesleigh Noe, MD    Allergies:   Bee venom and Penicillins   Social History   Socioeconomic History  . Marital status: Married    Spouse name: pamela  . Number of children: 2  . Years of education: 50  . Highest education level: Not on file  Occupational History  . Occupation: Oceanographer.  Tobacco Use  . Smoking status: Former Smoker    Packs/day: 2.00    Years: 25.00    Pack years: 50.00    Types: Cigarettes    Quit date: 03/10/1996    Years since quitting: 23.1  . Smokeless tobacco: Never Used  Substance and Sexual Activity  . Alcohol use: No    Alcohol/week: 0.0 standard drinks    Comment: in the past drank, quit 1993  . Drug use: No  . Sexual activity: Not Currently  Other Topics Concern  . Not on file  Social History Narrative   Lives with [name] (Wife) - together for 25 years   Children - [name] and [name] -- one is a Marine scientist in [employer] and other works for [employer] - 18, lives in [place]   Enjoys - playing golf, goes to [employer] and sponsors people as well, church   Support - family, good friends in [place]   Exercise - walks the dog, runs up hills   Diet - improved from before, tries to drink water - low salt or no salt   Social Determinants of Radio broadcast assistant Strain:   . Difficulty of Paying Living Expenses:   Food Insecurity:   . Worried About Charity fundraiser in the Last Year:   . Arboriculturist in the Last Year:   Transportation Needs:   . Film/video editor (Medical):   Marland Kitchen Lack of Transportation (Non-Medical):   Physical Activity:   . Days of Exercise per Week:   . Minutes of Exercise per Session:   Stress:   . Feeling of Stress :   Social Connections:   . Frequency of Communication with Friends and Family:   . Frequency of Social Gatherings with Friends and Family:   . Attends Religious Services:   . Active Member of Clubs or Organizations:   . Attends Archivist Meetings:   Marland Kitchen Marital Status:      Family  History:  The patient's family history includes Aneurysm (age of onset: 5) in his father; Stroke (age of onset: 48) in his mother.   ROS:   Please see the history of present illness.    ROS All other systems reviewed and are negative.   PHYSICAL EXAM:   VS:  BP 140/72   Pulse 76   Ht 5\' 9"  (1.753 m)   Wt 167 lb (75.8 kg)   SpO2  97%   BMI 24.66 kg/m    GEN: Well nourished, well developed, in no acute distress  HEENT: normal  Neck: no JVD, carotid bruits, or masses Cardiac: RRR; no murmurs, rubs, or gallops,no edema  Respiratory:  clear to auscultation bilaterally, normal work of breathing GI: soft, nontender, nondistended, + BS MS: no deformity or atrophy  Skin: warm and dry, no rash Neuro:  Alert and Oriented x 3, Strength and sensation are intact Psych: euthymic mood, full affect  Wt Readings from Last 3 Encounters:  05/15/19 167 lb (75.8 kg)  05/06/19 165 lb 4 oz (75 kg)  04/22/19 168 lb (76.2 kg)      Studies/Labs Reviewed:   EKG:  EKG is not ordered today.  Recent Labs: 11/08/2018: Hemoglobin 15.1; Platelets 155 11/15/2018: BUN 14; Creatinine, Ser 1.37; Potassium 4.1; Sodium 142 02/11/2019: TSH 1.11   Lipid Panel    Component Value Date/Time   CHOL 136 04/09/2018 0959   TRIG 111.0 04/09/2018 0959   HDL 58.40 04/09/2018 0959   CHOLHDL 2 04/09/2018 0959   VLDL 22.2 04/09/2018 0959   LDLCALC 55 04/09/2018 0959    Additional studies/ records that were reviewed today include:   LEFT HEART CATH AND CORONARY ANGIOGRAPHY  Conclusion  Conclusions: 1. Severe single-vessel coronary artery disease involving a branch of OM1 (80% ostial stenosis) and distal LCx/OM2 (70%). The affected vessels/branches are small and not well-suited to PCI (vessel size 2 mm or less). 2. Mild to moderate, non-obstructive coronary artery disease involving the LMCA, LAD, and RCA. 3. Normal left ventricular filling pressure.  Recommendations: 1. Aggressive medical therapy and secondary  prevention. I will add isosorbide mononitrate 15 mg daily, to be escalated as tolerated. 2. Continue antiplatelet and statin therapy. 3. Close outpatient follow-up with Dr. Elon Jester.    Diagnostic Dominance: Right  I  ASSESSMENT & PLAN:    1. CAD No angina.  Continue dual antiplatelet therapy with aspirin and Plavix.  Continue Imdur. Did not tolerated BB.   2. HLD - Due for labs.  Have it checked by PCP this month.  He will forward a copy.  Continue current dose of statin.  3. HTN -Relatively stable -Continue losartan 100 mg daily and Imdur 60 mg daily     Medication Adjustments/Labs and Tests Ordered: Current medicines are reviewed at length with the patient today.  Concerns regarding medicines are outlined above.  Medication changes, Labs and Tests ordered today are listed in the Patient Instructions below. Patient Instructions  Medication Instructions:  Your physician recommends that you continue on your current medications as directed. Please refer to the Current Medication list given to you today.  *If you need a refill on your cardiac medications before your next appointment, please call your pharmacy*   Lab Work: None ordered  If you have labs (blood work) drawn today and your tests are completely normal, you will receive your results only by: Marland Kitchen MyChart Message (if you have MyChart) OR . A paper copy in the mail If you have any lab test that is abnormal or we need to change your treatment, we will call you to review the results.   Testing/Procedures: None ordered   Follow-Up: At [facility], you and your health needs are our priority.  As part of our continuing mission to provide you with exceptional heart care, we have created designated Provider Care Teams.  These Care Teams include your primary Cardiologist (physician) and Advanced Practice Providers (APPs -  Physician Assistants and Nurse Practitioners)  who all work together to provide you with the  care you need, when you need it.  We recommend signing up for the patient portal called "MyChart".  Sign up information is provided on this After Visit Summary.  MyChart is used to connect with patients for Virtual Visits (Telemedicine).  Patients are able to view lab/test results, encounter notes, upcoming appointments, etc.  Non-urgent messages can be sent to your provider as well.   To learn more about what you can do with MyChart, go to [email or web address].    Your next appointment:   6 month(s)  The format for your next appointment:   In Person  Provider:   You may see Dorris Carnes, MD or one of the following Advanced Practice Providers on your designated Care Team:    Richardson Dopp, PA-C  [place], PA-C  Daune Perch, NP        Signed, [place], [place]  05/15/2019 4:21 PM    [address], [place], [place]  [address] Phone: [phone]; Fax: [phone]

## 2019-05-15 ENCOUNTER — Ambulatory Visit: Payer: PPO | Admitting: Physician Assistant

## 2019-05-15 ENCOUNTER — Other Ambulatory Visit: Payer: Self-pay

## 2019-05-15 ENCOUNTER — Encounter: Payer: Self-pay | Admitting: Physician Assistant

## 2019-05-15 VITALS — BP 140/72 | HR 76 | Ht 69.0 in | Wt 167.0 lb

## 2019-05-15 DIAGNOSIS — I10 Essential (primary) hypertension: Secondary | ICD-10-CM | POA: Diagnosis not present

## 2019-05-15 DIAGNOSIS — I2510 Atherosclerotic heart disease of native coronary artery without angina pectoris: Secondary | ICD-10-CM | POA: Diagnosis not present

## 2019-05-15 DIAGNOSIS — E782 Mixed hyperlipidemia: Secondary | ICD-10-CM

## 2019-05-15 NOTE — Patient Instructions (Signed)
Medication Instructions:  Your physician recommends that you continue on your current medications as directed. Please refer to the Current Medication list given to you today.  *If you need a refill on your cardiac medications before your next appointment, please call your pharmacy*   Lab Work: None ordered  If you have labs (blood work) drawn today and your tests are completely normal, you will receive your results only by: Marland Kitchen MyChart Message (if you have MyChart) OR . A paper copy in the mail If you have any lab test that is abnormal or we need to change your treatment, we will call you to review the results.   Testing/Procedures: None ordered   Follow-Up: At [facility], you and your health needs are our priority.  As part of our continuing mission to provide you with exceptional heart care, we have created designated Provider Care Teams.  These Care Teams include your primary Cardiologist (physician) and Advanced Practice Providers (APPs -  Physician Assistants and Nurse Practitioners) who all work together to provide you with the care you need, when you need it.  We recommend signing up for the patient portal called "MyChart".  Sign up information is provided on this After Visit Summary.  MyChart is used to connect with patients for Virtual Visits (Telemedicine).  Patients are able to view lab/test results, encounter notes, upcoming appointments, etc.  Non-urgent messages can be sent to your provider as well.   To learn more about what you can do with MyChart, go to [email or web address].    Your next appointment:   6 month(s)  The format for your next appointment:   In Person  Provider:   You may see Dorris Carnes, MD or one of the following Advanced Practice Providers on your designated Care Team:    Richardson Dopp, PA-C  Vin Madison, [place]  Daune Perch, [place]

## 2019-05-16 ENCOUNTER — Encounter: Payer: Self-pay | Admitting: Family Medicine

## 2019-05-16 DIAGNOSIS — E782 Mixed hyperlipidemia: Secondary | ICD-10-CM

## 2019-05-16 DIAGNOSIS — I10 Essential (primary) hypertension: Secondary | ICD-10-CM

## 2019-05-16 DIAGNOSIS — N1831 Chronic kidney disease, stage 3a: Secondary | ICD-10-CM

## 2019-05-17 ENCOUNTER — Other Ambulatory Visit (INDEPENDENT_AMBULATORY_CARE_PROVIDER_SITE_OTHER): Payer: PPO

## 2019-05-17 ENCOUNTER — Encounter: Payer: Self-pay | Admitting: Family Medicine

## 2019-05-17 ENCOUNTER — Other Ambulatory Visit: Payer: Self-pay

## 2019-05-17 DIAGNOSIS — N1831 Chronic kidney disease, stage 3a: Secondary | ICD-10-CM

## 2019-05-17 DIAGNOSIS — E782 Mixed hyperlipidemia: Secondary | ICD-10-CM | POA: Diagnosis not present

## 2019-05-17 DIAGNOSIS — I10 Essential (primary) hypertension: Secondary | ICD-10-CM | POA: Diagnosis not present

## 2019-05-17 LAB — COMPREHENSIVE METABOLIC PANEL
ALT: 22 U/L (ref 0–53)
AST: 19 U/L (ref 0–37)
Albumin: 4.4 g/dL (ref 3.5–5.2)
Alkaline Phosphatase: 77 U/L (ref 39–117)
BUN: 24 mg/dL — ABNORMAL HIGH (ref 6–23)
CO2: 29 mEq/L (ref 19–32)
Calcium: 9.7 mg/dL (ref 8.4–10.5)
Chloride: 103 mEq/L (ref 96–112)
Creatinine, Ser: 1.48 mg/dL (ref 0.40–1.50)
GFR: 46.38 mL/min — ABNORMAL LOW (ref >60.00)
Glucose, Bld: 92 mg/dL (ref 70–99)
Potassium: 4.2 mEq/L (ref 3.5–5.1)
Sodium: 140 mEq/L (ref 135–145)
Total Bilirubin: 0.7 mg/dL (ref 0.2–1.2)
Total Protein: 7 g/dL (ref 6.0–8.3)

## 2019-05-17 LAB — LIPID PANEL
Cholesterol: 160 mg/dL (ref 0–200)
HDL: 65.8 mg/dL (ref >39.00)
LDL Cholesterol: 77 mg/dL (ref 0–99)
NonHDL: 93.92
Total CHOL/HDL Ratio: 2
Triglycerides: 85 mg/dL (ref 0.0–149.0)
VLDL: 17 mg/dL (ref 0.0–40.0)

## 2019-05-20 ENCOUNTER — Encounter: Payer: Self-pay | Admitting: Family Medicine

## 2019-05-20 ENCOUNTER — Telehealth: Payer: Self-pay | Admitting: *Deleted

## 2019-05-20 NOTE — Telephone Encounter (Signed)
Left pt a message to call back re: lab results.

## 2019-05-23 NOTE — Telephone Encounter (Signed)
Pt returned call and he has been made aware of his lab results.  See result note.

## 2019-06-03 ENCOUNTER — Other Ambulatory Visit (HOSPITAL_COMMUNITY): Payer: Self-pay | Admitting: Interventional Radiology

## 2019-06-03 DIAGNOSIS — I771 Stricture of artery: Secondary | ICD-10-CM

## 2019-06-07 ENCOUNTER — Encounter: Payer: Self-pay | Admitting: Family Medicine

## 2019-06-13 ENCOUNTER — Other Ambulatory Visit: Payer: Self-pay

## 2019-06-13 ENCOUNTER — Ambulatory Visit (HOSPITAL_COMMUNITY)
Admission: RE | Admit: 2019-06-13 | Discharge: 2019-06-13 | Disposition: A | Discharge status: Home / Self Care | Payer: PPO | Source: Ambulatory Visit | Attending: Interventional Radiology | Admitting: Interventional Radiology

## 2019-06-13 DIAGNOSIS — I771 Stricture of artery: Secondary | ICD-10-CM | POA: Diagnosis not present

## 2019-06-13 DIAGNOSIS — I671 Cerebral aneurysm, nonruptured: Secondary | ICD-10-CM | POA: Diagnosis not present

## 2019-06-13 DIAGNOSIS — I6523 Occlusion and stenosis of bilateral carotid arteries: Secondary | ICD-10-CM | POA: Diagnosis not present

## 2019-06-13 MED ORDER — IOHEXOL 350 MG/ML SOLN
75.0000 mL | Freq: Once | INTRAVENOUS | Status: AC | PRN
  Administered 2019-06-13: 100 mL via INTRAVENOUS

## 2019-06-17 ENCOUNTER — Telehealth (HOSPITAL_COMMUNITY): Payer: Self-pay

## 2019-06-17 NOTE — Telephone Encounter (Signed)
Called pt regarding recent imaging, no answer, left vm. AW  

## 2019-06-17 NOTE — Telephone Encounter (Signed)
Returned pt's call. He was concerned about aneurysm. Sent a message to [place] and she discussed this with Dr. Estanislado Pandy. Per Dr. Estanislado Pandy, aneurysm is stable. It was not appreciated on CTA so instead of doing a CTA in 6 months, we will do a diagnostic angiogram. Pt agreed with this plan. AW

## 2019-07-10 ENCOUNTER — Encounter: Payer: Self-pay | Admitting: Family Medicine

## 2019-07-15 ENCOUNTER — Other Ambulatory Visit: Payer: Self-pay | Admitting: Family Medicine

## 2019-07-15 DIAGNOSIS — K2270 Barrett's esophagus without dysplasia: Secondary | ICD-10-CM

## 2019-07-16 ENCOUNTER — Other Ambulatory Visit: Payer: Self-pay | Admitting: Family Medicine

## 2019-07-16 DIAGNOSIS — K2270 Barrett's esophagus without dysplasia: Secondary | ICD-10-CM

## 2019-07-16 NOTE — Telephone Encounter (Signed)
Pharmacy called today in regards to refill request  She stated that the patient does have medication left  She stated that he received a partial refill on 5/29  11 tablets to get him until his next refill   She stated that she is needing a refill for 90 days   Please advise

## 2019-07-17 ENCOUNTER — Encounter: Payer: Self-pay | Admitting: Family Medicine

## 2019-07-17 ENCOUNTER — Ambulatory Visit (INDEPENDENT_AMBULATORY_CARE_PROVIDER_SITE_OTHER): Payer: PPO | Admitting: Family Medicine

## 2019-07-17 ENCOUNTER — Other Ambulatory Visit: Payer: Self-pay

## 2019-07-17 VITALS — BP 130/80 | HR 73 | Temp 98.2°F | Ht 69.0 in | Wt 166.5 lb

## 2019-07-17 DIAGNOSIS — M170 Bilateral primary osteoarthritis of knee: Secondary | ICD-10-CM | POA: Diagnosis not present

## 2019-07-17 NOTE — Progress Notes (Signed)
John Wyndham T. [name], MD, [place]  Primary Care and [place] at [facility] [place], [address]  Phone: [phone]  FAX: [place] - 74 y.o. male  MRN [id number]  Date of Birth: 1946/02/02  Date: 07/17/2019  PCP: John Noe, MD  Referral: John Noe, MD  Chief Complaint  Patient presents with  . Knee Pain    This visit occurred during the SARS-CoV-2 public health emergency.  Safety protocols were in place, including screening questions prior to the visit, additional usage of staff PPE, and extensive cleaning of exam room while observing appropriate contact time as indicated for disinfecting solutions.   Subjective:   John Salazar is a 74 y.o. very pleasant male patient with Body mass index is 24.59 kg/m. who presents with the following:  F/u knee OA.    He had some acute pain last week.  He was moving a lot of heavy boxes for food pantry.  He had some achiness that was a lot worse than normal he does have some achiness going up and down stairs.  At this point this is resolved and is back to baseline.  He also has some questions about exercise.    Walking and then his left knee was really bad with walking up and down stairs.  Was helping at the food pantry.  Some achiness and back to baseline.   Review of Systems is noted in the HPI, as appropriate   Objective:   BP 130/80   Pulse 73   Temp 98.2 F (36.8 C) (Temporal)   Ht 5\' 9"  (1.753 m)   Wt 166 lb 8 oz (75.5 kg)   SpO2 97%   BMI 24.59 kg/m    GEN: No acute distress; alert,appropriate. PULM: Breathing comfortably in no respiratory distress PSYCH: Normally interactive.    Bilateral knees: Full extension and flexion to 125 degrees.  No significant effusion.  Mild medial joint line tenderness and none on the lateral joint line.  No pain with loading the patellar facets.  Stable ACL, PCL, MCL, and LCL.  Flexion  pinch and McMurray's do not cause any significant pain.  Radiology: No results found.  Assessment and Plan:     ICD-10-CM   1. Primary osteoarthritis of knees, bilateral  M17.0    Relatively mild OA exacerbation.  Now back to baseline.  We also talked about pool workouts, and he and his wife are going to start going to the [facility].  I really gave him a lot of encouragement.  Follow-up: No follow-ups on file.  No orders of the defined types were placed in this encounter.  There are no discontinued medications. No orders of the defined types were placed in this encounter.   Signed,  John Salazar. John Rosenberger, MD   Outpatient Encounter Medications as of 07/17/2019  Medication Sig  . acetaminophen (TYLENOL) 500 MG tablet Take 1,000 mg by mouth every 6 (six) hours as needed for headache.   Marland Kitchen aspirin EC 81 MG tablet Take 81 mg by mouth daily.  . clopidogrel (PLAVIX) 75 MG tablet Take 1 tablet (75 mg total) by mouth daily.  Marland Kitchen escitalopram (LEXAPRO) 10 MG tablet Take 20 mg by mouth daily.  Marland Kitchen losartan (COZAAR) 100 MG tablet TAKE 1 TABLET ONCE DAILY.  . nitroGLYCERIN (NITROSTAT) 0.4 MG SL tablet Place 1 tablet (0.4 mg total) under the tongue every 5 (five) minutes as needed for  chest pain.  . pantoprazole (PROTONIX) 40 MG tablet Take 1 tablet (40 mg total) by mouth daily.  . rosuvastatin (CRESTOR) 20 MG tablet TAKE 1 TABLET ONCE DAILY.  . tamsulosin (FLOMAX) 0.4 MG CAPS capsule TAKE 1 CAPSULE 30 MINUTES AFTER THE SAME MEAL EACH DAY ONCE DAY.  . isosorbide mononitrate (IMDUR) 60 MG 24 hr tablet Take 1 tablet (60 mg total) by mouth daily.   No facility-administered encounter medications on file as of 07/17/2019.

## 2019-07-17 NOTE — Telephone Encounter (Signed)
Spoke with pharmacy and medication was clarified.

## 2019-08-01 DIAGNOSIS — L905 Scar conditions and fibrosis of skin: Secondary | ICD-10-CM | POA: Diagnosis not present

## 2019-08-01 DIAGNOSIS — B351 Tinea unguium: Secondary | ICD-10-CM | POA: Diagnosis not present

## 2019-08-01 DIAGNOSIS — L570 Actinic keratosis: Secondary | ICD-10-CM | POA: Diagnosis not present

## 2019-08-01 DIAGNOSIS — Z85828 Personal history of other malignant neoplasm of skin: Secondary | ICD-10-CM | POA: Diagnosis not present

## 2019-08-01 DIAGNOSIS — L853 Xerosis cutis: Secondary | ICD-10-CM | POA: Diagnosis not present

## 2019-09-03 ENCOUNTER — Other Ambulatory Visit: Payer: Self-pay

## 2019-09-13 ENCOUNTER — Encounter: Payer: Self-pay | Admitting: Family Medicine

## 2019-09-19 ENCOUNTER — Other Ambulatory Visit: Payer: Self-pay

## 2019-09-19 ENCOUNTER — Encounter: Payer: Self-pay | Admitting: Family Medicine

## 2019-09-19 ENCOUNTER — Ambulatory Visit (INDEPENDENT_AMBULATORY_CARE_PROVIDER_SITE_OTHER): Payer: PPO | Admitting: Family Medicine

## 2019-09-19 VITALS — BP 128/66 | HR 76 | Temp 98.0°F | Ht 69.0 in | Wt 164.0 lb

## 2019-09-19 DIAGNOSIS — M25561 Pain in right knee: Secondary | ICD-10-CM | POA: Diagnosis not present

## 2019-09-19 DIAGNOSIS — G8929 Other chronic pain: Secondary | ICD-10-CM | POA: Diagnosis not present

## 2019-09-19 DIAGNOSIS — M25562 Pain in left knee: Secondary | ICD-10-CM | POA: Diagnosis not present

## 2019-09-19 DIAGNOSIS — M170 Bilateral primary osteoarthritis of knee: Secondary | ICD-10-CM

## 2019-09-19 MED ORDER — METHYLPREDNISOLONE ACETATE 40 MG/ML IJ SUSP
80.0000 mg | Freq: Once | INTRAMUSCULAR | Status: AC
  Administered 2019-09-19: 80 mg via INTRA_ARTICULAR

## 2019-09-19 NOTE — Patient Instructions (Signed)
OSTEOARTHRITIS: Over the counter  Tylenol: 2 tablets up to 3-4 times a day  Topical Capzaicin Cream, as needed (wear glove to put on) - THIS IS EXCEPTIONALLY HOT  Voltaren 1% gel. Over the counter You can apply up to 4 times a day Minimal is absorbed in the bloodstream Cost is about 9 dollars  Ice joints on bad days, 20 min, 2-3 x / day REGULAR EXERCISE: swimming, Yoga, Tai Chi, bicycle (NON-IMPACT activity)

## 2019-09-19 NOTE — Progress Notes (Signed)
° ° °  Brycen Bean T. Torien Ramroop, MD, [place]  Primary Care and [place] at [facility] [place], [address]  Phone: [phone]   FAX: [phone]  [name] - 74 y.o. male   MRN [id number]   Date of Birth: July 23, 1945  Date: 09/19/2019   PCP: Lesleigh Noe, MD   Referral: Lesleigh Noe, MD  Chief Complaint  Patient presents with   Knee Pain    bilateral knee pain, worse in the past 2 months, would like to discuss getting injections again.     This visit occurred during the SARS-CoV-2 public health emergency.  Safety protocols were in place, including screening questions prior to the visit, additional usage of staff PPE, and extensive cleaning of exam room while observing appropriate contact time as indicated for disinfecting solutions.   Subjective:   John Salazar is a 74 y.o. very pleasant male patient with Body mass index is 24.22 kg/m. who presents with the following:  B knee OA: Chronic pain.  Pain with going up and down stairs and he does have crepitus at baseline.  Procedure only.  Aspiration/Injection Procedure Note John Salazar 1945/11/16 Date of procedure: 09/19/2019  Procedure: Large Joint Joint Aspiration / Injection of the Right Knee Indications: Pain  Procedure Details Patient verbally consented to procedure. Risks (including potential rare risk of infection), benefits, and alternatives explained. Sterilely prepped with Chloraprep. Ethyl cholride used for anesthesia. 8 cc Lidocaine 1% mixed with 2 mL Depo-Medrol 40 mg injected using the anteromedial approach without difficulty. No complications with procedure and tolerated well. Patient had decreased pain post-injection.  Medication: 2 mL of Depo-Medrol 40 mg, equaling Depo-Medrol 80 mg total  Aspiration/Injection Procedure Note John Salazar [birth year] Date of procedure: 09/19/2019  Procedure: Large Joint Aspiration / Injection of the  Left Knee Indications: Pain  Procedure Details Patient verbally consented to procedure. Risks (including potential rare risk of infection), benefits, and alternatives explained. Sterilely prepped with Chloraprep. Ethyl cholride used for anesthesia. 8 cc Lidocaine 1% mixed with 2 mL Depo-Medrol 40 mg injected using the anteromedial approach without difficulty. No complications with procedure and tolerated well. Patient had decreased pain post-injection.  Medication: 2 mL of Depo-Medrol 40 mg, equaling Depo-Medrol 80 mg total    ICD-10-CM   1. Primary osteoarthritis of knees, bilateral  M17.0   2. Chronic pain of both knees  M25.561 methylPREDNISolone acetate (DEPO-MEDROL) injection 80 mg   M25.562 methylPREDNISolone acetate (DEPO-MEDROL) injection 80 mg   G89.29     Meds ordered this encounter  Medications   methylPREDNISolone acetate (DEPO-MEDROL) injection 80 mg   methylPREDNISolone acetate (DEPO-MEDROL) injection 80 mg   Signed,  Kmarion Rawl T. Schyler Butikofer, MD

## 2019-10-03 DIAGNOSIS — H4912 Fourth [trochlear] nerve palsy, left eye: Secondary | ICD-10-CM | POA: Diagnosis not present

## 2019-10-03 DIAGNOSIS — Z961 Presence of intraocular lens: Secondary | ICD-10-CM | POA: Diagnosis not present

## 2019-10-03 DIAGNOSIS — H524 Presbyopia: Secondary | ICD-10-CM | POA: Diagnosis not present

## 2019-10-07 ENCOUNTER — Other Ambulatory Visit: Payer: Self-pay | Admitting: Family Medicine

## 2019-10-07 ENCOUNTER — Other Ambulatory Visit: Payer: Self-pay | Admitting: Physician Assistant

## 2019-10-07 DIAGNOSIS — K2270 Barrett's esophagus without dysplasia: Secondary | ICD-10-CM

## 2019-10-08 ENCOUNTER — Telehealth: Payer: Self-pay | Admitting: Student

## 2019-10-08 NOTE — Telephone Encounter (Signed)
NIR.  Received fax prescription refill request for Plavix. Faxed filled prescription to [facility] in [place], [place] [phone]) at [phone]- Plavix 75 mg tablets, take one tablet by mouth once daily, dispense 120 tablets with 1 refill.   Bea Graff Rani Idler, PA-C 10/08/2019, 10:26 AM

## 2019-11-14 DIAGNOSIS — Z1152 Encounter for screening for COVID-19: Secondary | ICD-10-CM | POA: Diagnosis not present

## 2019-11-14 DIAGNOSIS — Z03818 Encounter for observation for suspected exposure to other biological agents ruled out: Secondary | ICD-10-CM | POA: Diagnosis not present

## 2019-11-18 ENCOUNTER — Ambulatory Visit (INDEPENDENT_AMBULATORY_CARE_PROVIDER_SITE_OTHER): Payer: PPO

## 2019-11-18 ENCOUNTER — Other Ambulatory Visit: Payer: Self-pay

## 2019-11-18 ENCOUNTER — Telehealth (HOSPITAL_COMMUNITY): Payer: Self-pay | Admitting: Radiology

## 2019-11-18 ENCOUNTER — Encounter: Payer: Self-pay | Admitting: Podiatry

## 2019-11-18 ENCOUNTER — Ambulatory Visit: Payer: PPO | Admitting: Podiatry

## 2019-11-18 DIAGNOSIS — M722 Plantar fascial fibromatosis: Secondary | ICD-10-CM

## 2019-11-18 NOTE — Telephone Encounter (Signed)
Pt called and asked when his next follow-up is due. He is due in November for a cerebral angiogram with Dr. Estanislado Pandy. Will call him in early November to schedule. He agrees with this plan of care. JM

## 2019-11-20 NOTE — Progress Notes (Signed)
Subjective:   Patient ID: John Salazar, male   DOB: 74 y.o.   MRN: [id number]   HPI Patient presents stating that he has had pain in his heels right over left and its been going on recently.  States he has had a history of this and he does not wanted to get bad again and states that it is gotten worse over the last couple weeks.  Patient does not smoke likes to be active   Review of Systems  All other systems reviewed and are negative.       Objective:  Physical Exam Vitals and nursing note reviewed.  Constitutional:      Appearance: He is well-developed.  Pulmonary:     Effort: Pulmonary effort is normal.  Musculoskeletal:        General: Normal range of motion.  Skin:    General: Skin is warm.  Neurological:     Mental Status: He is alert.     Neurovascular status was found to be intact muscle strength was found to be adequate.  Patient has exquisite discomfort plantar aspect heel right over left with inflammation fluid of the medial band at its insertional point into the calcaneus.  Patient is found to have good digital perfusion and is well oriented x3 with moderate depression of the arch     Assessment:  Acute plantar fasciitis right over left with inflammation fluid of the medial band     Plan:  H&P reviewed condition and went ahead did sterile prep and injected the fascia 3 mg Kenalog 5 mg Xylocaine discussed physical therapy anti-inflammatory shoe gear modification and reappoint to recheck  X-rays indicated there is small spur formation no indications of stress fracture or advanced arthritis on x-ray report

## 2019-11-21 ENCOUNTER — Ambulatory Visit: Payer: PPO | Admitting: Internal Medicine

## 2019-11-23 ENCOUNTER — Other Ambulatory Visit: Payer: Self-pay

## 2019-11-23 ENCOUNTER — Ambulatory Visit: Payer: PPO | Attending: Internal Medicine

## 2019-11-23 DIAGNOSIS — Z23 Encounter for immunization: Secondary | ICD-10-CM

## 2019-11-23 NOTE — Progress Notes (Signed)
   Covid-19 Vaccination Clinic  Name:  AYUB KIRSH    MRN: [id number] DOB: Oct 16, 1945  11/23/2019  Mr. Cadmus was observed post Covid-19 immunization for 15 minutes without incident. He was provided with Vaccine Information Sheet and instruction to access the V-Safe system.   Mr. Lange was instructed to call 911 with any severe reactions post vaccine: Marland Kitchen Difficulty breathing  . Swelling of face and throat  . A fast heartbeat  . A bad rash all over body  . Dizziness and weakness

## 2019-12-07 ENCOUNTER — Ambulatory Visit (INDEPENDENT_AMBULATORY_CARE_PROVIDER_SITE_OTHER): Payer: PPO

## 2019-12-07 ENCOUNTER — Other Ambulatory Visit: Payer: Self-pay

## 2019-12-07 DIAGNOSIS — Z23 Encounter for immunization: Secondary | ICD-10-CM

## 2020-01-06 ENCOUNTER — Other Ambulatory Visit: Payer: Self-pay | Admitting: Family Medicine

## 2020-01-08 ENCOUNTER — Other Ambulatory Visit: Payer: Self-pay | Admitting: Family Medicine

## 2020-01-08 DIAGNOSIS — I10 Essential (primary) hypertension: Secondary | ICD-10-CM

## 2020-01-12 NOTE — Progress Notes (Signed)
Cardiology Office Note   Date:  01/13/2020   ID:  John Salazar 1945-07-04, MRN [id number]  PCP:  Lesleigh Noe, MD  Cardiologist:   Dorris Carnes, MD   Pt presents for f/u of HTN      History of Present Illness: John Salazar is a 74 y.o. male with a history of HTN, GERD, EtOH abuse  And CV dz  The pt developed dizziness in July 2019   Work up showed signif CV dz and he underatent PTA/Stent in July.    The dizziness has improved I saw the pt in 2019   He was seen by B Bhagat in 2020   Underwent L heart cath which is noted below  Cath showed mod dz and one tight OM lesion.  Plan was to continue medical Rx  He was taken off b blocker  Imdur added   Current Meds  Medication Sig  . acetaminophen (TYLENOL) 500 MG tablet Take 1,000 mg by mouth every 6 (six) hours as needed for headache.   Marland Kitchen aspirin EC 81 MG tablet Take 81 mg by mouth daily.  . clopidogrel (PLAVIX) 75 MG tablet Take 1 tablet (75 mg total) by mouth daily.  Marland Kitchen escitalopram (LEXAPRO) 10 MG tablet TAKE 1 TABLET ONCE DAILY.  . isosorbide mononitrate (IMDUR) 60 MG 24 hr tablet TAKE 1 TABLET BY MOUTH DAILY.  Marland Kitchen losartan (COZAAR) 100 MG tablet TAKE 1 TABLET ONCE DAILY.  . pantoprazole (PROTONIX) 40 MG tablet TAKE 1 TABLET BY MOUTH DAILY.  . rosuvastatin (CRESTOR) 20 MG tablet TAKE 1 TABLET ONCE DAILY.  . tamsulosin (FLOMAX) 0.4 MG CAPS capsule TAKE 1 CAPSULE 30 MINUTES AFTER THE SAME MEAL EACH DAY ONCE DAY.     Allergies:   Bee venom and Penicillins   Past Medical History:  Diagnosis Date  . Anxiety   . BPH (benign prostatic hyperplasia)   . Chronic kidney disease    stage 2  . Colon polyps   . Erectile dysfunction   . GERD (gastroesophageal reflux disease)   . History of stomach ulcers   . Hypercholesteremia   . Hypertension   . 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 APPENDECTOMY  1970  . CATARACT EXTRACTION W/ INTRAOCULAR LENS  IMPLANT, BILATERAL    .  COLONOSCOPY  10/2012  . ESOPHAGOGASTRODUODENOSCOPY ENDOSCOPY  10/2012  . HERNIA REPAIR    . IR ANGIO INTRA EXTRACRAN SEL COM CAROTID INNOMINATE BILAT MOD SED  08/09/2017  . IR ANGIO INTRA EXTRACRAN SEL COM CAROTID INNOMINATE BILAT MOD SED  11/08/2018  . IR ANGIO VERTEBRAL SEL VERTEBRAL BILAT MOD SED  08/09/2017  . IR ANGIO VERTEBRAL SEL VERTEBRAL BILAT MOD SED  11/08/2018  . IR INTRAVSC STENT CERV CAROTID W/EMB-PROT MOD SED INCL ANGIO  08/23/2017  . LEFT HEART CATH AND CORONARY ANGIOGRAPHY N/A 11/15/2018   Procedure: LEFT HEART CATH AND CORONARY ANGIOGRAPHY;  Surgeon: Nelva Bush, MD;  Location: [facility];  Service: Cardiovascular;  Laterality: N/A;  . PROSTATE SURGERY  1999   20 years ago, enlarged prostate  . RADIOLOGY WITH ANESTHESIA N/A 08/23/2017   Procedure: IR WITH ANESTHESIA STENT PLACEMENT;  Surgeon: Luanne Bras, MD;  Location: [facility];  Service: Radiology;  Laterality: N/A;     Social History:  The patient  reports that he quit smoking about 23 years ago. His smoking use included cigarettes. He has a 50.00 pack-year  smoking history. He has never used smokeless tobacco. He reports that he does not drink alcohol and does not use drugs.   Family History:  The patient's family history includes Aneurysm (age of onset: 74) in his father; Stroke (age of onset: 35) in his mother.    ROS:  Please see the history of present illness. All other systems are reviewed and  Negative to the above problem except as noted.    PHYSICAL EXAM: VS:  BP 120/76   Pulse 94   Ht 5\' 9"  (1.753 m)   Wt 166 lb 3.2 oz (75.4 kg)   SpO2 97%   BMI 24.54 kg/m   GEN: Well nourished, well developed, in no acute distress  HEENT: normal  Neck: no JVD,  Cardiac: RRR; No murmurs  No LE edema  Respiratory:  clear to auscultation bilaterally, normal work of breathing GI: soft, nontender, nondistended, + BS  No hepatomegaly  MS: no deformity Moving all extremities   Skin: warm and dry, no rash Neuro:   Strength and sensation are intact Psych: euthymic mood, full affect   EKG:  EKG is ordered today.NSR 94 bpm   Lipid Panel    Component Value Date/Time   CHOL 160 05/17/2019 0729   TRIG 85.0 05/17/2019 0729   HDL 65.80 05/17/2019 0729   CHOLHDL 2 05/17/2019 0729   VLDL 17.0 05/17/2019 0729   LDLCALC 77 05/17/2019 0729    CATH:  11/15/18  Left Main  Vessel is large.  Dist LM lesion is 20% stenosed.  Left Anterior Descending  Vessel is moderate in size.  Ost LAD to Prox LAD lesion is 35% stenosed. The lesion is moderately calcified.  Prox LAD to Mid LAD lesion is 45% stenosed. The lesion is eccentric.  First Diagonal Branch  Vessel is small in size.  Left Circumflex  Vessel is moderate in size.  Ost Cx to Prox Cx lesion is 40% stenosed.  Mid Cx to Dist Cx lesion is 70% stenosed.  First Obtuse Marginal Branch  Vessel is moderate in size.  Lateral First Obtuse Marginal Branch  Vessel is small in size.  Lat 1st Mrg lesion is 80% stenosed.  Second Obtuse Marginal Branch  Vessel is small in size.  2nd Mrg lesion is 70% stenosed.  Third Obtuse Marginal Branch  Vessel is small in size.  Right Coronary Artery  Vessel is small. Anterior takeoff.  Prox RCA lesion is 40% stenosed.     Wt Readings from Last 3 Encounters:  01/13/20 166 lb 3.2 oz (75.4 kg)  09/19/19 164 lb (74.4 kg)  07/17/19 166 lb 8 oz (75.5 kg)      ASSESSMENT AND PLAN:  1 CAD  Doing fairly well on current regmein  Cath as noted above   Plan for risk reduction   Check lipids  Keep walking     2  HTN  BP is OK    3  CV dz  Keep on AsA and Plavix   Check CBC   4  Lipids   Check lipids     Current medicines are reviewed at length with the patient today.  The patient does not have concerns regarding medicines.  Signed, Dorris Carnes, MD  01/13/2020 11:12 AM    [address] [place], [place], [place]  [address] Phone: [phone]; Fax: [phone]

## 2020-01-13 ENCOUNTER — Encounter: Payer: Self-pay | Admitting: Internal Medicine

## 2020-01-13 ENCOUNTER — Ambulatory Visit: Payer: PPO | Admitting: Internal Medicine

## 2020-01-13 ENCOUNTER — Other Ambulatory Visit: Payer: Self-pay

## 2020-01-13 VITALS — BP 120/76 | HR 94 | Ht 69.0 in | Wt 166.2 lb

## 2020-01-13 DIAGNOSIS — E782 Mixed hyperlipidemia: Secondary | ICD-10-CM

## 2020-01-13 DIAGNOSIS — I2510 Atherosclerotic heart disease of native coronary artery without angina pectoris: Secondary | ICD-10-CM | POA: Diagnosis not present

## 2020-01-13 NOTE — Patient Instructions (Signed)
Medication Instructions:  No changes *If you need a refill on your cardiac medications before your next appointment, please call your pharmacy*   Lab Work: Today: cbc, bmet, NMR, lipomed panel  If you have labs (blood work) drawn today and your tests are completely normal, you will receive your results only by: Marland Kitchen MyChart Message (if you have MyChart) OR . A paper copy in the mail If you have any lab test that is abnormal or we need to change your treatment, we will call you to review the results.   Testing/Procedures: none   Follow-Up: At [facility], you and your health needs are our priority.  As part of our continuing mission to provide you with exceptional heart care, we have created designated Provider Care Teams.  These Care Teams include your primary Cardiologist (physician) and Advanced Practice Providers (APPs -  Physician Assistants and Nurse Practitioners) who all work together to provide you with the care you need, when you need it.    Your next appointment:   7 month(s)  The format for your next appointment:   In Person  Provider:   You may see Dorris Carnes, MD or one of the following Advanced Practice Providers on your designated Care Team:    Richardson Dopp, PA-C  Robbie Lis, [place]    Other Instructions

## 2020-01-14 ENCOUNTER — Encounter: Payer: Self-pay | Admitting: Family Medicine

## 2020-01-14 LAB — NMR, LIPOPROFILE

## 2020-01-21 LAB — BASIC METABOLIC PANEL
BUN/Creatinine Ratio: 12 (ref 10–24)
BUN: 18 mg/dL (ref 8–27)
CO2: 22 mmol/L (ref 20–29)
Calcium: 9.3 mg/dL (ref 8.6–10.2)
Chloride: 107 mmol/L — ABNORMAL HIGH (ref 96–106)
Creatinine, Ser: 1.5 mg/dL — ABNORMAL HIGH (ref 0.76–1.27)
GFR calc Af Amer: 52 mL/min/{1.73_m2} — ABNORMAL LOW (ref >59)
GFR calc non Af Amer: 45 mL/min/{1.73_m2} — ABNORMAL LOW (ref >59)
Glucose: 98 mg/dL (ref 65–99)
Potassium: 4.1 mmol/L (ref 3.5–5.2)
Sodium: 143 mmol/L (ref 134–144)

## 2020-01-21 LAB — APOLIPOPROTEIN B: Apolipoprotein B: 73 mg/dL (ref <90)

## 2020-01-21 LAB — CBC
Hematocrit: 41.5 % (ref 37.5–51.0)
Hemoglobin: 13.8 g/dL (ref 13.0–17.7)
MCH: 30.4 pg (ref 26.6–33.0)
MCHC: 33.3 g/dL (ref 31.5–35.7)
MCV: 91 fL (ref 79–97)
Platelets: 170 10*3/uL (ref 150–450)
RBC: 4.54 x10E6/uL (ref 4.14–5.80)
RDW: 12.6 % (ref 11.6–15.4)
WBC: 5.2 10*3/uL (ref 3.4–10.8)

## 2020-01-21 LAB — NMR, LIPOPROFILE

## 2020-01-21 LAB — LIPOPROTEIN A (LPA): Lipoprotein (a): 25.1 nmol/L (ref <75.0)

## 2020-01-24 ENCOUNTER — Other Ambulatory Visit: Payer: Self-pay | Admitting: *Deleted

## 2020-01-24 DIAGNOSIS — E782 Mixed hyperlipidemia: Secondary | ICD-10-CM

## 2020-01-27 ENCOUNTER — Other Ambulatory Visit: Payer: Self-pay | Admitting: *Deleted

## 2020-01-27 DIAGNOSIS — E782 Mixed hyperlipidemia: Secondary | ICD-10-CM | POA: Diagnosis not present

## 2020-01-28 LAB — NMR, LIPOPROFILE
Cholesterol, Total: 152 mg/dL (ref 100–199)
HDL Particle Number: 34 umol/L (ref >=30.5)
HDL-C: 50 mg/dL (ref >39)
LDL Particle Number: 870 nmol/L (ref <1000)
LDL Size: 20.5 nm — ABNORMAL LOW (ref >20.5)
LDL-C (NIH Calc): 78 mg/dL (ref 0–99)
LP-IR Score: 35 (ref <=45)
Small LDL Particle Number: 359 nmol/L (ref <=527)
Triglycerides: 136 mg/dL (ref 0–149)

## 2020-02-11 DIAGNOSIS — L905 Scar conditions and fibrosis of skin: Secondary | ICD-10-CM | POA: Diagnosis not present

## 2020-02-11 DIAGNOSIS — L821 Other seborrheic keratosis: Secondary | ICD-10-CM | POA: Diagnosis not present

## 2020-02-11 DIAGNOSIS — R202 Paresthesia of skin: Secondary | ICD-10-CM | POA: Diagnosis not present

## 2020-02-11 DIAGNOSIS — L814 Other melanin hyperpigmentation: Secondary | ICD-10-CM | POA: Diagnosis not present

## 2020-02-11 DIAGNOSIS — Z85828 Personal history of other malignant neoplasm of skin: Secondary | ICD-10-CM | POA: Diagnosis not present

## 2020-02-11 DIAGNOSIS — L819 Disorder of pigmentation, unspecified: Secondary | ICD-10-CM | POA: Diagnosis not present

## 2020-02-11 DIAGNOSIS — L570 Actinic keratosis: Secondary | ICD-10-CM | POA: Diagnosis not present

## 2020-02-11 DIAGNOSIS — L812 Freckles: Secondary | ICD-10-CM | POA: Diagnosis not present

## 2020-02-11 DIAGNOSIS — D229 Melanocytic nevi, unspecified: Secondary | ICD-10-CM | POA: Diagnosis not present

## 2020-02-12 ENCOUNTER — Encounter: Payer: Self-pay | Admitting: Family Medicine

## 2020-02-12 NOTE — Telephone Encounter (Signed)
Can you please arrange for him to come see me nonurgently for follow-up of his knee osteoarthritis.

## 2020-02-16 NOTE — Progress Notes (Signed)
[name], MD, [place]  [facility] and [facility] at [facility] [place], [address]  Phone: [phone]   FAX: [place] - 75 y.o. male   MRN [id number]   Date of Birth: August 04, 1945  Date: 02/17/2020   PCP: Lesleigh Noe, MD   Referral: Lesleigh Noe, MD  Chief Complaint  Patient presents with   Knee Pain    Bilateral    This visit occurred during the SARS-CoV-2 public health emergency.  Safety protocols were in place, including screening questions prior to the visit, additional usage of staff PPE, and extensive cleaning of exam room while observing appropriate contact time as indicated for disinfecting solutions.   Subjective:   John Salazar is a 75 y.o. very pleasant male patient with Body mass index is 24.48 kg/m. who presents with the following:  He presents with some B knee OA follow-up.  We need to get new x-rays today.  He is on Plavix.  He is a nice gentleman and have seen him before for bilateral knee pain. I done a few corticosteroid injections on him which have provided him with some reasonable relief. I last did some injections on him 6 months ago.  He does have it intermittent significant pain, today rates his symptoms as a 1 out of 10. Does have pain rising from a seated position going up and down stairs such that he has to markedly alter his up-and-down pattern.  He is not having any locking up or symptomatic giving way.  Tylenol has not helped his symptoms at all.  Prior to Covid he had been working out a lot more and going to [employer].  Review of Systems is noted in the HPI, as appropriate   Objective:   BP 130/86    Pulse 90    Temp 97.9 F (36.6 C) (Temporal)    Ht _0  (1.753 m)    Wt 165 lb 12 oz (75.2 kg)    SpO2 96%    BMI 24.48 kg/m   Bilateral knees: Full extension. Bilaterally, pain with loading the medial and lateral patellar facets with  some pain medially on the joint line.  Stable ACL, PCL, MCL, and LCL. Bounce home testing as well as flexion pinch and McMurray's produce minimal pain.  He has no effusion.  Radiology: DG Knee 4 Views W/Patella Left  Result Date: 02/17/2020 CLINICAL DATA:  Pain EXAM: LEFT KNEE - COMPLETE 4+ VIEW COMPARISON:  None. FINDINGS: Weightbearing frontal, weight-bearing tunnel, weight-bearing lateral, and sunrise patellar images were obtained. There is no fracture or dislocation. No joint effusion. There is slight narrowing medially. Other joint spaces appear unremarkable. There is a spur along the anterior superior patella. No erosive change. There are scattered foci of arterial vascular calcification. IMPRESSION: Slight narrowing medially. No fracture, dislocation, or joint effusion. A spur along the anterior superior patella may be indicative of distal quadriceps tendinosis. Foci of arterial vascular calcification noted. Electronically Signed   By: Lowella Grip III M.D.   On: 02/17/2020 09:50   DG Knee 4 Views W/Patella Right  Result Date: 02/17/2020 CLINICAL DATA:  Pain EXAM: RIGHT KNEE - COMPLETE 4+ VIEW COMPARISON:  None. FINDINGS: Weightbearing frontal, weight-bearing tunnel, weight-bearing lateral, and sunrise patellar images were obtained. No fracture or dislocation. No appreciable joint effusion. There is mild narrowing medially. Other joint spaces appear unremarkable. There is a spur along the anterior superior  patella. There are foci of arterial vascular calcification in the popliteal artery region. IMPRESSION: Mild narrowing medially. Other joint spaces appear unremarkable. Spur along the anterior superior patella is likely indicative of distal quadriceps tendinosis. No fracture, dislocation, or joint effusion. Atherosclerosis/arterial vascular calcification noted in the popliteal artery. Electronically Signed   By: Lowella Grip III M.D.   On: 02/17/2020 09:51     Assessment and Plan:      ICD-10-CM   1. Primary osteoarthritis of knees, bilateral  M17.0 DG Knee 4 Views W/Patella Left    DG Knee 4 Views W/Patella Right  2. Leg weakness, bilateral  R29.898    He does have some arthritis, this is better radiographically than I would have anticipated. My suspicion is that deconditioning is playing a significant role when he does have some quadriceps wasting bilaterally. Acute on chronic with exacerbation.  I went over a comprehensive nonoperative knee osteoarthritis program with him. I think that if he works on this regularly that his strength and proprioception will improve.  For now trial of multiple other remedies to see if they improve.  Social: Knee pain is limiting his ability to walk for exercise.  Patient Instructions  OSTEOARTHRITIS: Over the counter  Tylenol: 2 tablets up to 3-4 times a day  Supplements: Tart cherry juice - get the concentrated capsules or gelcaps over the counter so you do not get the calories from the juice.  Lidocaine 4% cream - can try that, too.  Voltaren 1% gel. Over the counter You can apply up to 4 times a day Minimal is absorbed in the bloodstream Cost is about 9 dollars  Ice joints on bad days, 20 min, 2-3 x / day REGULAR EXERCISE: swimming, Yoga, Tai Chi, bicycle (NON-IMPACT activity)     No orders of the defined types were placed in this encounter.  There are no discontinued medications. Orders Placed This Encounter  Procedures   DG Knee 4 Views W/Patella Left   DG Knee 4 Views W/Patella Right    Follow-up: No follow-ups on file.  Signed,  [name]. Dionna Wiedemann, MD   Outpatient Encounter Medications as of 02/17/2020  Medication Sig   acetaminophen (TYLENOL) 500 MG tablet Take 1,000 mg by mouth every 6 (six) hours as needed for headache.    aspirin EC 81 MG tablet Take 81 mg by mouth daily.   clopidogrel (PLAVIX) 75 MG tablet Take 1 tablet (75 mg total) by mouth daily.   escitalopram (LEXAPRO) 10 MG tablet  TAKE 1 TABLET ONCE DAILY.   isosorbide mononitrate (IMDUR) 60 MG 24 hr tablet TAKE 1 TABLET BY MOUTH DAILY.   losartan (COZAAR) 100 MG tablet TAKE 1 TABLET ONCE DAILY.   pantoprazole (PROTONIX) 40 MG tablet TAKE 1 TABLET BY MOUTH DAILY.   rosuvastatin (CRESTOR) 20 MG tablet TAKE 1 TABLET ONCE DAILY.   tamsulosin (FLOMAX) 0.4 MG CAPS capsule TAKE 1 CAPSULE 30 MINUTES AFTER THE SAME MEAL EACH DAY ONCE DAY.   nitroGLYCERIN (NITROSTAT) 0.4 MG SL tablet Place 1 tablet (0.4 mg total) under the tongue every 5 (five) minutes as needed for chest pain.   No facility-administered encounter medications on file as of 02/17/2020.

## 2020-02-17 ENCOUNTER — Encounter: Payer: Self-pay | Admitting: Family Medicine

## 2020-02-17 ENCOUNTER — Ambulatory Visit (INDEPENDENT_AMBULATORY_CARE_PROVIDER_SITE_OTHER): Payer: PPO | Admitting: Family Medicine

## 2020-02-17 ENCOUNTER — Other Ambulatory Visit: Payer: Self-pay

## 2020-02-17 ENCOUNTER — Ambulatory Visit (INDEPENDENT_AMBULATORY_CARE_PROVIDER_SITE_OTHER)
Admission: RE | Admit: 2020-02-17 | Discharge: 2020-02-17 | Disposition: A | Discharge status: Home / Self Care | Payer: PPO | Source: Ambulatory Visit | Attending: Family Medicine | Admitting: Family Medicine

## 2020-02-17 VITALS — BP 130/86 | HR 90 | Temp 97.9°F | Ht 69.0 in | Wt 165.8 lb

## 2020-02-17 DIAGNOSIS — I708 Atherosclerosis of other arteries: Secondary | ICD-10-CM | POA: Diagnosis not present

## 2020-02-17 DIAGNOSIS — M170 Bilateral primary osteoarthritis of knee: Secondary | ICD-10-CM

## 2020-02-17 DIAGNOSIS — M76892 Other specified enthesopathies of left lower limb, excluding foot: Secondary | ICD-10-CM | POA: Diagnosis not present

## 2020-02-17 DIAGNOSIS — M1712 Unilateral primary osteoarthritis, left knee: Secondary | ICD-10-CM | POA: Diagnosis not present

## 2020-02-17 DIAGNOSIS — M1711 Unilateral primary osteoarthritis, right knee: Secondary | ICD-10-CM | POA: Diagnosis not present

## 2020-02-17 DIAGNOSIS — R29898 Other symptoms and signs involving the musculoskeletal system: Secondary | ICD-10-CM | POA: Diagnosis not present

## 2020-02-17 NOTE — Patient Instructions (Signed)
OSTEOARTHRITIS: Over the counter  Tylenol: 2 tablets up to 3-4 times a day  Supplements: Tart cherry juice - get the concentrated capsules or gelcaps over the counter so you do not get the calories from the juice.  Lidocaine 4% cream - can try that, too.  Voltaren 1% gel. Over the counter You can apply up to 4 times a day Minimal is absorbed in the bloodstream Cost is about 9 dollars  Ice joints on bad days, 20 min, 2-3 x / day REGULAR EXERCISE: swimming, Yoga, Tai Chi, bicycle (NON-IMPACT activity)

## 2020-03-15 ENCOUNTER — Encounter: Payer: Self-pay | Admitting: Family Medicine

## 2020-03-26 ENCOUNTER — Encounter: Payer: Self-pay | Admitting: Family Medicine

## 2020-04-06 ENCOUNTER — Other Ambulatory Visit: Payer: Self-pay | Admitting: Student

## 2020-04-06 ENCOUNTER — Other Ambulatory Visit (HOSPITAL_COMMUNITY): Payer: Self-pay | Admitting: Interventional Radiology

## 2020-04-06 DIAGNOSIS — I671 Cerebral aneurysm, nonruptured: Secondary | ICD-10-CM

## 2020-04-06 DIAGNOSIS — M170 Bilateral primary osteoarthritis of knee: Secondary | ICD-10-CM

## 2020-04-06 DIAGNOSIS — M25561 Pain in right knee: Secondary | ICD-10-CM

## 2020-04-06 DIAGNOSIS — I771 Stricture of artery: Secondary | ICD-10-CM

## 2020-04-06 DIAGNOSIS — G8929 Other chronic pain: Secondary | ICD-10-CM

## 2020-04-06 NOTE — Progress Notes (Signed)
Refill of Plavix 75 mg PO daily called into patient's preferred pharmacy.   Brynda Greathouse, MS RD PA-C

## 2020-04-07 ENCOUNTER — Other Ambulatory Visit: Payer: Self-pay | Admitting: Family Medicine

## 2020-04-07 DIAGNOSIS — I10 Essential (primary) hypertension: Secondary | ICD-10-CM

## 2020-04-07 MED ORDER — TAMSULOSIN HCL 0.4 MG PO CAPS: ORAL_CAPSULE | ORAL | 3 refills | Status: DC

## 2020-04-07 NOTE — Addendum Note (Signed)
Addended by: Kris Mouton on: 04/07/2020 05:17 PM   Modules accepted: Orders

## 2020-04-07 NOTE — Telephone Encounter (Signed)
Pharmacy requests refill on: Losartan 100 mg   LAST REFILL: 01/08/2020 (Q-90, R-0) LAST OV: 05/06/2019 NEXT OV: 04/29/2020 PHARMACY: [facility] requests refill on: Rosuvastatin 20 mg  LAST REFILL: 01/06/2020 (Q-90, R-0) LAST OV: 05/06/2019 NEXT OV: 04/29/2020 PHARMACY: [facility]

## 2020-04-13 ENCOUNTER — Other Ambulatory Visit: Payer: Self-pay | Admitting: Radiology

## 2020-04-14 ENCOUNTER — Ambulatory Visit (HOSPITAL_COMMUNITY)
Admission: RE | Admit: 2020-04-14 | Discharge: 2020-04-14 | Disposition: A | Discharge status: Home / Self Care | Payer: PPO | Source: Ambulatory Visit | Attending: Interventional Radiology | Admitting: Interventional Radiology

## 2020-04-14 ENCOUNTER — Telehealth (HOSPITAL_COMMUNITY): Payer: Self-pay

## 2020-04-14 ENCOUNTER — Other Ambulatory Visit (HOSPITAL_COMMUNITY): Payer: Self-pay | Admitting: Interventional Radiology

## 2020-04-14 ENCOUNTER — Encounter (HOSPITAL_COMMUNITY): Payer: Self-pay

## 2020-04-14 ENCOUNTER — Other Ambulatory Visit: Payer: Self-pay

## 2020-04-14 DIAGNOSIS — Z880 Allergy status to penicillin: Secondary | ICD-10-CM | POA: Insufficient documentation

## 2020-04-14 DIAGNOSIS — I6522 Occlusion and stenosis of left carotid artery: Secondary | ICD-10-CM | POA: Diagnosis not present

## 2020-04-14 DIAGNOSIS — Z91030 Bee allergy status: Secondary | ICD-10-CM | POA: Insufficient documentation

## 2020-04-14 DIAGNOSIS — Z7902 Long term (current) use of antithrombotics/antiplatelets: Secondary | ICD-10-CM | POA: Insufficient documentation

## 2020-04-14 DIAGNOSIS — I671 Cerebral aneurysm, nonruptured: Secondary | ICD-10-CM | POA: Diagnosis not present

## 2020-04-14 DIAGNOSIS — Z79899 Other long term (current) drug therapy: Secondary | ICD-10-CM | POA: Diagnosis not present

## 2020-04-14 DIAGNOSIS — Z7982 Long term (current) use of aspirin: Secondary | ICD-10-CM | POA: Diagnosis not present

## 2020-04-14 DIAGNOSIS — I771 Stricture of artery: Secondary | ICD-10-CM

## 2020-04-14 DIAGNOSIS — Z87891 Personal history of nicotine dependence: Secondary | ICD-10-CM | POA: Insufficient documentation

## 2020-04-14 DIAGNOSIS — I63232 Cerebral infarction due to unspecified occlusion or stenosis of left carotid arteries: Secondary | ICD-10-CM | POA: Diagnosis not present

## 2020-04-14 HISTORY — PX: IR ANGIO VERTEBRAL SEL VERTEBRAL BILAT MOD SED: IMG5369

## 2020-04-14 HISTORY — PX: IR ANGIO INTRA EXTRACRAN SEL COM CAROTID INNOMINATE BILAT MOD SED: IMG5360

## 2020-04-14 HISTORY — PX: IR 3D INDEPENDENT WKST: IMG2385

## 2020-04-14 HISTORY — PX: IR US GUIDE VASC ACCESS RIGHT: IMG2390

## 2020-04-14 LAB — CBC
HCT: 42.2 % (ref 39.0–52.0)
Hemoglobin: 14.2 g/dL (ref 13.0–17.0)
MCH: 30.6 pg (ref 26.0–34.0)
MCHC: 33.6 g/dL (ref 30.0–36.0)
MCV: 90.9 fL (ref 80.0–100.0)
Platelets: 188 10*3/uL (ref 150–400)
RBC: 4.64 MIL/uL (ref 4.22–5.81)
RDW: 12.7 % (ref 11.5–15.5)
WBC: 5 10*3/uL (ref 4.0–10.5)
nRBC: 0 % (ref 0.0–0.2)

## 2020-04-14 LAB — BASIC METABOLIC PANEL
Anion gap: 6 (ref 5–15)
BUN: 19 mg/dL (ref 8–23)
CO2: 26 mmol/L (ref 22–32)
Calcium: 9.4 mg/dL (ref 8.9–10.3)
Chloride: 105 mmol/L (ref 98–111)
Creatinine, Ser: 1.71 mg/dL — ABNORMAL HIGH (ref 0.61–1.24)
GFR, Estimated: 41 mL/min — ABNORMAL LOW (ref >60)
Glucose, Bld: 98 mg/dL (ref 70–99)
Potassium: 4.2 mmol/L (ref 3.5–5.1)
Sodium: 137 mmol/L (ref 135–145)

## 2020-04-14 LAB — PROTIME-INR
INR: 1 (ref 0.8–1.2)
Prothrombin Time: 13.2 seconds (ref 11.4–15.2)

## 2020-04-14 MED ORDER — HEPARIN SODIUM (PORCINE) 1000 UNIT/ML IJ SOLN
INTRAMUSCULAR | Status: AC
  Filled 2020-04-14: qty 1

## 2020-04-14 MED ORDER — FENTANYL CITRATE (PF) 100 MCG/2ML IJ SOLN
INTRAMUSCULAR | Status: AC
  Filled 2020-04-14: qty 2

## 2020-04-14 MED ORDER — IOHEXOL 300 MG/ML  SOLN
150.0000 mL | Freq: Once | INTRAMUSCULAR | Status: AC | PRN
  Administered 2020-04-14: 10 mL via INTRA_ARTERIAL

## 2020-04-14 MED ORDER — LIDOCAINE HCL 1 % IJ SOLN
INTRAMUSCULAR | Status: AC
  Filled 2020-04-14: qty 20

## 2020-04-14 MED ORDER — SODIUM CHLORIDE 0.9 % IV SOLN: Freq: Once | INTRAVENOUS | Status: AC

## 2020-04-14 MED ORDER — IOHEXOL 300 MG/ML  SOLN
100.0000 mL | Freq: Once | INTRAMUSCULAR | Status: AC | PRN
  Administered 2020-04-14: 65 mL via INTRA_ARTERIAL

## 2020-04-14 MED ORDER — HYDRALAZINE HCL 20 MG/ML IJ SOLN
INTRAMUSCULAR | Status: AC | PRN
  Administered 2020-04-14: 5 mg via INTRAVENOUS

## 2020-04-14 MED ORDER — LIDOCAINE HCL (PF) 1 % IJ SOLN
INTRAMUSCULAR | Status: AC | PRN
Start: 2020-04-14 — End: 2020-04-14
  Administered 2020-04-14: 10 mL

## 2020-04-14 MED ORDER — SODIUM CHLORIDE 0.9 % IV SOLN: INTRAVENOUS | Status: AC

## 2020-04-14 MED ORDER — VERAPAMIL HCL 2.5 MG/ML IV SOLN
INTRAVENOUS | Status: AC
  Filled 2020-04-14: qty 2

## 2020-04-14 MED ORDER — MIDAZOLAM HCL 2 MG/2ML IJ SOLN
INTRAMUSCULAR | Status: AC
  Filled 2020-04-14: qty 2

## 2020-04-14 MED ORDER — FENTANYL CITRATE (PF) 100 MCG/2ML IJ SOLN
INTRAMUSCULAR | Status: AC | PRN
  Administered 2020-04-14: 25 ug via INTRAVENOUS

## 2020-04-14 MED ORDER — HYDRALAZINE HCL 20 MG/ML IJ SOLN
INTRAMUSCULAR | Status: AC
  Filled 2020-04-14: qty 1

## 2020-04-14 MED ORDER — MIDAZOLAM HCL 2 MG/2ML IJ SOLN
INTRAMUSCULAR | Status: AC | PRN
  Administered 2020-04-14: 1 mg via INTRAVENOUS

## 2020-04-14 MED ORDER — NITROGLYCERIN 1 MG/10 ML FOR IR/CATH LAB: 1000.0000 ug | Freq: Once | INTRA_ARTERIAL | Status: DC

## 2020-04-14 NOTE — Procedures (Signed)
S/P 4 vessel cerebral artrriogram RT Rad approach. Findings. 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 2.Approx 50 to 70% % stenosis of RT MCA distal M1 seg 3.Lt ICA prox approx 50 % stenosis associated with 2 focal outpouchings measuring 3.2 mmx 3.4 mm and 3.7 mm x 75mm ?ulcerations v pseudoaneurysms stable 4.Approx 50 % stenosis Lt VBJ stable. S.Andreika Vandagriff MD

## 2020-04-14 NOTE — H&P (Signed)
Chief Complaint: Patient was seen in consultation today for Cerebral arteriogram   Referring Physician(s): Dr Myrla Halsted  Supervising Physician: Luanne Bras  Patient Status: [facility] - Out-pt  History of Present Illness: John Salazar is a 75 y.o. male   Known to NIR Previous R ICA angioplasty/stent 08/2017 Follows with Dr Estanislado Pandy Angio 11/2018: stable per dictation CTA Neck 06/13/2019: IMPRESSION: 1. No acute intracranial abnormality. Chronic microvascular ischemic change in the white matter 2. Stenting across the right carotid bifurcation. Stent is widely patent with good flow. 3. Mild atherosclerotic disease left carotid bifurcation without significant stenosis 4. Mild stenosis origin of the vertebral artery bilaterally. 5. Irregularity of the supraclinoid internal carotid artery on the left. This could represent aneurysm versus atherosclerotic disease. 6. Intracranial at atherosclerotic disease with mild-to-moderate stenosis right M1 segment. Severe stenosis proximal right posterior cerebral artery and moderate stenosis left posterior cerebral artery. No intracranial large vessel occlusion.  Pt states he is doing well; takes ASA/Plavix daily Denies dizziness; denies speech or vision changes Denies headache Getting around well Does notice he is feeling more tired recently-- maybe in last few months  Scheduled today for follow up Cerebral arteriogram   Past Medical History:  Diagnosis Date  . Anxiety   . BPH (benign prostatic hyperplasia)   . Chronic kidney disease    stage 2  . Colon polyps   . Erectile dysfunction   . GERD (gastroesophageal reflux disease)   . History of stomach ulcers   . Hypercholesteremia   . Hypertension   . 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 APPENDECTOMY  1970  . CATARACT EXTRACTION W/ INTRAOCULAR LENS  IMPLANT, BILATERAL    . COLONOSCOPY  10/2012  .  ESOPHAGOGASTRODUODENOSCOPY ENDOSCOPY  10/2012  . HERNIA REPAIR    . IR ANGIO INTRA EXTRACRAN SEL COM CAROTID INNOMINATE BILAT MOD SED  08/09/2017  . IR ANGIO INTRA EXTRACRAN SEL COM CAROTID INNOMINATE BILAT MOD SED  11/08/2018  . IR ANGIO VERTEBRAL SEL VERTEBRAL BILAT MOD SED  08/09/2017  . IR ANGIO VERTEBRAL SEL VERTEBRAL BILAT MOD SED  11/08/2018  . IR INTRAVSC STENT CERV CAROTID W/EMB-PROT MOD SED INCL ANGIO  08/23/2017  . LEFT HEART CATH AND CORONARY ANGIOGRAPHY N/A 11/15/2018   Procedure: LEFT HEART CATH AND CORONARY ANGIOGRAPHY;  Surgeon: Nelva Bush, MD;  Location: [facility];  Service: Cardiovascular;  Laterality: N/A;  . PROSTATE SURGERY  1999   20 years ago, enlarged prostate  . RADIOLOGY WITH ANESTHESIA N/A 08/23/2017   Procedure: IR WITH ANESTHESIA STENT PLACEMENT;  Surgeon: Luanne Bras, MD;  Location: [facility];  Service: Radiology;  Laterality: N/A;    Allergies: Bee venom and Penicillins  Medications: Prior to Admission medications   Medication Sig Start Date End Date Taking? Authorizing Provider  acetaminophen (TYLENOL) 650 MG CR tablet Take 1,300 mg by mouth every 8 (eight) hours as needed for pain.   Yes [provider]  aspirin EC 81 MG tablet Take 81 mg by mouth daily.   Yes [provider]  clopidogrel (PLAVIX) 75 MG tablet Take 1 tablet (75 mg total) by mouth daily. 04/13/18  Yes Bruning, Lennette Bihari, PA-C  escitalopram (LEXAPRO) 10 MG tablet TAKE 1 TABLET ONCE DAILY. Patient taking differently: Take 10 mg by mouth daily. 10/07/19  Yes Lesleigh Noe, MD  isosorbide mononitrate (IMDUR) 60 MG 24 hr tablet TAKE 1 TABLET BY MOUTH DAILY. Patient  taking differently: Take 60 mg by mouth daily. 10/09/19  Yes Bhagat, Bhavinkumar, PA  losartan (COZAAR) 100 MG tablet TAKE 1 TABLET ONCE DAILY. Patient taking differently: Take 100 mg by mouth daily. 04/07/20  Yes Lesleigh Noe, MD  nitroGLYCERIN (NITROSTAT) 0.4 MG SL tablet Place 1 tablet (0.4 mg total) under the  tongue every 5 (five) minutes as needed for chest pain. 11/15/18 11/15/19 Yes End, Harrell Gave, MD  pantoprazole (PROTONIX) 40 MG tablet TAKE 1 TABLET BY MOUTH DAILY. Patient taking differently: Take 40 mg by mouth daily. 10/07/19  Yes Lesleigh Noe, MD  rosuvastatin (CRESTOR) 20 MG tablet TAKE 1 TABLET ONCE DAILY. Patient taking differently: Take 20 mg by mouth daily. 04/07/20  Yes Lesleigh Noe, MD  tamsulosin (FLOMAX) 0.4 MG CAPS capsule TAKE 1 CAPSULE 30 MINUTES AFTER THE SAME MEAL EACH DAY ONCE DAY. Patient taking differently: Take 0.4 mg by mouth daily. 04/07/20  Yes Lesleigh Noe, MD     Family History  Problem Relation Age of Onset  . Stroke Mother 48  . Aneurysm Father 4    Social History   Socioeconomic History  . Marital status: Married    Spouse name: pamela  . Number of children: 2  . Years of education: 60  . Highest education level: Not on file  Occupational History  . Occupation: Oceanographer.  Tobacco Use  . Smoking status: Former Smoker    Packs/day: 2.00    Years: 25.00    Pack years: 50.00    Types: Cigarettes    Quit date: 03/10/1996    Years since quitting: 24.1  . Smokeless tobacco: Never Used  Vaping Use  . Vaping Use: Never used  Substance and Sexual Activity  . Alcohol use: No    Alcohol/week: 0.0 standard drinks    Comment: in the past drank, quit 1993  . Drug use: No  . Sexual activity: Not Currently  Other Topics Concern  . Not on file  Social History Narrative   Lives with [name] (Wife) - together for 25 years   Children - [name] and [name] -- one is a Marine scientist in [employer] and other works for [employer] - 18, lives in [place]   Enjoys - playing golf, goes to [employer] and sponsors people as well, church   Support - family, good friends in [place]   Exercise - walks the dog, runs up hills   Diet - improved from before, tries to drink water - low salt or no salt   Social Determinants of Radio broadcast assistant Strain: Not  on file  Food Insecurity: Not on file  Transportation Needs: Not on file  Physical Activity: Not on file  Stress: Not on file  Social Connections: Not on file    Review of Systems: A 12 point ROS discussed and pertinent positives are indicated in the HPI above.  All other systems are negative.  Review of Systems  Constitutional: Positive for fatigue. Negative for activity change and fever.  HENT: Negative for tinnitus and trouble swallowing.   Eyes: Negative for visual disturbance.  Respiratory: Negative for cough and shortness of breath.   Cardiovascular: Negative for chest pain.  Gastrointestinal: Negative for abdominal pain.  Musculoskeletal: Negative for back pain and gait problem.  Neurological: Negative for dizziness, tremors, seizures, syncope, facial asymmetry, speech difficulty, weakness, light-headedness, numbness and headaches.  Psychiatric/Behavioral: Negative for behavioral problems and confusion.    Vital Signs: BP (!) 135/97  Pulse 85   Temp (!) 97.5 F (36.4 C) (Oral)   Ht 5\' 9"  (1.753 m)   Wt 160 lb (72.6 kg)   SpO2 100%   BMI 23.63 kg/m   Physical Exam Vitals reviewed.  HENT:     Mouth/Throat:     Mouth: Mucous membranes are moist.  Cardiovascular:     Rate and Rhythm: Normal rate and regular rhythm.     Heart sounds: Normal heart sounds.  Pulmonary:     Effort: Pulmonary effort is normal.     Breath sounds: Normal breath sounds.  Abdominal:     Palpations: Abdomen is soft.  Musculoskeletal:        General: Normal range of motion.     Right lower leg: No edema.     Left lower leg: No edema.  Skin:    General: Skin is warm.  Neurological:     Mental Status: He is alert and oriented to person, place, and time.  Psychiatric:        Behavior: Behavior normal.     Imaging: No results found.  Labs:  CBC: Recent Labs    01/13/20 1138  WBC 5.2  HGB 13.8  HCT 41.5  PLT 170    COAGS: No results for input(s): INR, APTT in the last  8760 hours.  BMP: Recent Labs    05/17/19 0729 01/13/20 1138  NA 140 143  K 4.2 4.1  CL 103 107*  CO2 29 22  GLUCOSE 92 98  BUN 24* 18  CALCIUM 9.7 9.3  CREATININE 1.48 1.50*  GFRNONAA  --  45*  GFRAA  --  52*    LIVER FUNCTION TESTS: Recent Labs    05/17/19 0729  BILITOT 0.7  AST 19  ALT 22  ALKPHOS 77  PROT 7.0  ALBUMIN 4.4    TUMOR MARKERS: No results for input(s): AFPTM, CEA, CA199, CHROMGRNA in the last 8760 hours.  Assessment and Plan:  Previous R ICA angioplasty/stent 08/2017 Stable follow ups  Scheduled now for follow up cerebral arteriogram Risks and benefits of cerebral angiogram with intervention were discussed with the patient including, but not limited to bleeding, infection, vascular injury, contrast induced renal failure, stroke or even death.  This interventional procedure involves the use of X-rays and because of the nature of the planned procedure, it is possible that we will have prolonged use of X-ray fluoroscopy.  Potential radiation risks to you include (but are not limited to) the following: - A slightly elevated risk for cancer  several years later in life. This risk is typically less than 0.5% percent. This risk is low in comparison to the normal incidence of human cancer, which is 33% for women and 50% for men according to the [place]. - Radiation induced injury can include skin redness, resembling a rash, tissue breakdown / ulcers and hair loss (which can be temporary or permanent).   The likelihood of either of these occurring depends on the difficulty of the procedure and whether you are sensitive to radiation due to previous procedures, disease, or genetic conditions.   IF your procedure requires a prolonged use of radiation, you will be notified and given written instructions for further action.  It is your responsibility to monitor the irradiated area for the 2 weeks following the procedure and to notify your physician  if you are concerned that you have suffered a radiation induced injury.    All of the patient's questions were answered, patient is agreeable  to proceed.  Consent signed and in chart.  Thank you for this interesting consult.  I greatly enjoyed meeting John Salazar and look forward to participating in their care.  A copy of this report was sent to the requesting provider on this date.  Electronically Signed: Lavonia Drafts, PA-C 04/14/2020, 8:44 AM   I spent a total of    25 Minutes in face to face in clinical consultation, greater than 50% of which was counseling/coordinating care for cerebral arteriogram

## 2020-04-14 NOTE — Discharge Instructions (Signed)
Radial Site Care  This sheet gives you information about how to care for yourself after your procedure. Your health care provider may also give you more specific instructions. If you have problems or questions, contact your health care provider. What can I expect after the procedure? After the procedure, it is common to have:  Bruising and tenderness at the catheter insertion area. Follow these instructions at home: Medicines  Take over-the-counter and prescription medicines only as told by your health care provider. Insertion site care 1. Follow instructions from your health care provider about how to take care of your insertion site. Make sure you: ? Wash your hands with soap and water before you remove your bandage (dressing). If soap and water are not available, use hand sanitizer. ? May remove dressing in 24 hours. 2. Check your insertion site every day for signs of infection. Check for: ? Redness, swelling, or pain. ? Fluid or blood. ? Pus or a bad smell. ? Warmth. 3. Do no take baths, swim, or use a hot tub for 5 days. 4. You may shower 24-48 hours after the procedure. ? Remove the dressing and gently wash the site with plain soap and water. ? Pat the area dry with a clean towel. ? Do not rub the site. That could cause bleeding. 5. Do not apply powder or lotion to the site. Activity  1. For 24 hours after the procedure, or as directed by your health care provider: ? Do not flex or bend the affected arm. ? Do not push or pull heavy objects with the affected arm. ? Do not drive yourself home from the hospital or clinic. You may drive 24 hours after the procedure. ? Do not operate machinery or power tools. ? KEEP ARM ELEVATED THE REMAINDER OF THE DAY. 2. Do not push, pull or lift anything that is heavier than 10 lb for 5 days. 3. Ask your health care provider when it is okay to: ? Return to work or school. ? Resume usual physical activities or sports. ? Resume sexual  activity. General instructions  If the catheter site starts to bleed, raise your arm and put firm pressure on the site. If the bleeding does not stop, get help right away. This is a medical emergency.  DRINK PLENTY OF FLUIDS FOR THE NEXT 2-3 DAYS.  No alcohol consumption for 24 hours after receiving sedation.  If you went home on the same day as your procedure, a responsible adult should be with you for the first 24 hours after you arrive home.  Keep all follow-up visits as told by your health care provider. This is important. Contact a health care provider if:  You have a fever.  You have redness, swelling, or yellow drainage around your insertion site. Get help right away if:  You have unusual pain at the radial site.  The catheter insertion area swells very fast.  The insertion area is bleeding, and the bleeding does not stop when you hold steady pressure on the area.  Your arm or hand becomes pale, cool, tingly, or numb. These symptoms may represent a serious problem that is an emergency. Do not wait to see if the symptoms will go away. Get medical help right away. Call your local emergency services (911 in the [place] Do not drive yourself to the hospital. Summary  After the procedure, it is common to have bruising and tenderness at the site.  Follow instructions from your health care provider about how to take care   of your radial site wound. Check the wound every day for signs of infection.  This information is not intended to replace advice given to you by your health care provider. Make sure you discuss any questions you have with your health care provider. Document Revised: 03/01/2017 Document Reviewed: 03/01/2017 Elsevier Patient Education  2020 Elsevier Inc. 

## 2020-04-14 NOTE — Sedation Documentation (Signed)
Wife at bedside discussing results with Dr. Estanislado Pandy.

## 2020-04-14 NOTE — Telephone Encounter (Signed)
Called to schedule consult, no answer, left vm. AW  

## 2020-04-17 ENCOUNTER — Telehealth: Payer: Self-pay

## 2020-04-17 ENCOUNTER — Ambulatory Visit (INDEPENDENT_AMBULATORY_CARE_PROVIDER_SITE_OTHER): Payer: PPO

## 2020-04-17 ENCOUNTER — Encounter: Payer: Self-pay | Admitting: Orthopedic Surgery

## 2020-04-17 ENCOUNTER — Ambulatory Visit: Payer: PPO | Admitting: Orthopedic Surgery

## 2020-04-17 ENCOUNTER — Telehealth (HOSPITAL_COMMUNITY): Payer: Self-pay | Admitting: Radiology

## 2020-04-17 ENCOUNTER — Other Ambulatory Visit: Payer: Self-pay

## 2020-04-17 DIAGNOSIS — M79604 Pain in right leg: Secondary | ICD-10-CM | POA: Diagnosis not present

## 2020-04-17 DIAGNOSIS — M170 Bilateral primary osteoarthritis of knee: Secondary | ICD-10-CM

## 2020-04-17 DIAGNOSIS — M79605 Pain in left leg: Secondary | ICD-10-CM

## 2020-04-17 NOTE — Telephone Encounter (Signed)
Can we please get auth for bilat gel injections?

## 2020-04-17 NOTE — Telephone Encounter (Signed)
Called pt, left VM for him to call to schedule consult with Deveshwar to discuss recent cerebral angiogram. JM

## 2020-04-17 NOTE — Progress Notes (Signed)
Office Visit Note   Patient: John Salazar           Date of Birth: 1945/02/26           MRN: [id number] Visit Date: 04/17/2020 Requested by: Owens Loffler, MD [place],  [place] [address] PCP: Lesleigh Noe, MD  Subjective: Chief Complaint  Patient presents with   Left Knee - Pain   Right Knee - Pain    HPI: John Salazar is a 75 y.o. male who presents to the office complaining of bilateral knee pain.  Patient is here on referral from Dr. Lorelei Pont.  He has a long history of bilateral knee pain for several years.  He has been seeing Dr. Lorelei Pont and receiving cortisone injections.  These injections helped for about 1 month at this point and provide 30% relief.  He notes both knees bother him about equally but sometimes the right knee bothers him more than the left.  He feels his pain is worsening and gives him trouble especially walking uphill.  He takes Tylenol without relief.  He cannot take NSAIDs due to his history of kidney disease.  Pain is waking him up at night.  He has not tried any gel injections.  Denies any significant groin pain.  Occasional mild low back pain but states that this is not a big concern for him.  Denies any numbness or tingling.  No radicular pain from the back down the leg.  Denies any history of knee, hip, back surgery.  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  No history of diabetes..                ROS: All systems reviewed are negative as they relate to the chief complaint within the history of present illness.  Patient denies fevers or chills.  Assessment & Plan: Visit Diagnoses:  1. Bilateral leg pain   2. Primary osteoarthritis of knees, bilateral     Plan: Patient is a 75 year old male who presents complaining of bilateral knee pain right greater than left.  Has long history of knee pain for which she has been receiving serial injections by Dr. Lorelei Pont.  Complains of knee  pain that he localizes to the anterior knee that is worse with walking uphill and seems to be worsening.  Injections help but are providing less and less relief.  Cannot take NSAIDs due to kidney disease.  His bilateral knee radiographs were reviewed today and show fairly mild degenerative changes but nothing that aligns with his moderate and worsening symptoms.  Bilateral hip radiographs were taken today that showed no significant hip joint degeneration or any hip pathology.  With continued and worsening pain and no significant findings on radiographs, plan to order MRI of the right knee for further evaluation.  Also plan to preapproved patient for bilateral knee gel injections.  Referred patient to physical therapy for bilateral knee therapy to see if this will help as well as he is interested in pursuing exercises.  Follow-up after MRI to review results.  This patient is diagnosed with osteoarthritis of the knee(s).    Radiographs show evidence of joint space narrowing, osteophytes, subchondral sclerosis and/or subchondral cysts.  This patient has knee pain which interferes with functional and activities of daily living.    This patient has experienced inadequate response, adverse effects and/or intolerance with conservative treatments such as acetaminophen, NSAIDS, topical creams, physical  therapy or regular exercise, knee bracing and/or weight loss.   This patient has experienced inadequate response or has a contraindication to intra articular steroid injections for at least 3 months.   This patient is not scheduled to have a total knee replacement within 6 months of starting treatment with viscosupplementation.   Follow-Up Instructions: No follow-ups on file.   Orders:  Orders Placed This Encounter  Procedures   XR HIPS BILAT W OR W/O PELVIS 3-4 VIEWS   No orders of the defined types were placed in this encounter.     Procedures: No procedures performed   Clinical Data: No  additional findings.  Objective: Vital Signs: There were no vitals taken for this visit.  Physical Exam:  Constitutional: Patient appears well-developed HEENT:  Head: Normocephalic Eyes:EOM are normal Neck: Normal range of motion Cardiovascular: Normal rate Pulmonary/chest: Effort normal Neurologic: Patient is alert Skin: Skin is warm Psychiatric: Patient has normal mood and affect  Ortho Exam: Ortho exam demonstrates bilateral knees with no effusion.  Mild tenderness over the medial joint lines bilaterally.  No tenderness over the lateral joint line bilaterally.  No calf tenderness.  Negative Homans' sign.  Able to perform straight leg raise.  0 degrees of extension bilaterally.  Greater than 120 degrees of flexion.  Negative Stinchfield exam.  Excellent internal rotation range of motion of bilateral hips but he does have some pain at terminal internal rotation and terminal hip flexion.  No significant tenderness throughout the lumbar spine.  Negative straight leg raise bilaterally.  Specialty Comments:  No specialty comments available.  Imaging: No results found.   PMFS History: Patient Active Problem List   Diagnosis Date Noted   Depression, major, single episode, mild ([place]) 02/27/2019   Snoring 02/27/2019   Accelerating angina ([place]) 11/15/2018   DNR (do not resuscitate) 08/23/2018   Hx of cataract surgery 08/23/2018   History of right common carotid artery stent placement 06/18/2018   Screening for prostate cancer 06/18/2018   Prediabetes 03/19/2018   CKD (chronic kidney disease) stage 3, GFR 30-59 ml/min (HCC) 02/05/2018   Barrett esophagus 01/08/2018   Benign essential HTN 01/08/2018   BPH (benign prostatic hyperplasia) 01/08/2018   HLD (hyperlipidemia) 01/08/2018   Anemia 01/08/2018   Carotid stenosis, symptomatic w/o infarct, right 08/23/2017   Past Medical History:  Diagnosis Date   Anxiety    BPH (benign prostatic hyperplasia)    Chronic  kidney disease    stage 2   Colon polyps    Erectile dysfunction    GERD (gastroesophageal reflux disease)    History of stomach ulcers    Hypercholesteremia    Hypertension    Positive TB test    in the past    Family History  Problem Relation Age of Onset   Stroke Mother 77   Aneurysm Father 98    Past Surgical History:  Procedure Laterality Date   APPENDECTOMY  1970   CATARACT EXTRACTION W/ INTRAOCULAR LENS  IMPLANT, BILATERAL     COLONOSCOPY  10/2012   ESOPHAGOGASTRODUODENOSCOPY ENDOSCOPY  10/2012   HERNIA REPAIR     IR 3D INDEPENDENT WKST  04/14/2020   IR ANGIO INTRA EXTRACRAN SEL COM CAROTID INNOMINATE BILAT MOD SED  08/09/2017   IR ANGIO INTRA EXTRACRAN SEL COM CAROTID INNOMINATE BILAT MOD SED  11/08/2018   IR ANGIO INTRA EXTRACRAN SEL COM CAROTID INNOMINATE BILAT MOD SED  04/14/2020   IR ANGIO VERTEBRAL SEL VERTEBRAL BILAT MOD SED  08/09/2017   IR ANGIO VERTEBRAL  SEL VERTEBRAL BILAT MOD SED  11/08/2018   IR ANGIO VERTEBRAL SEL VERTEBRAL BILAT MOD SED  04/14/2020   IR INTRAVSC STENT CERV CAROTID W/EMB-PROT MOD SED INCL ANGIO  08/23/2017   IR US GUIDE VASC ACCESS RIGHT  04/14/2020   LEFT HEART CATH AND CORONARY ANGIOGRAPHY N/A 11/15/2018   Procedure: LEFT HEART CATH AND CORONARY ANGIOGRAPHY;  Surgeon: Nelva Bush, MD;  Location: [facility];  Service: Cardiovascular;  Laterality: N/A;   PROSTATE SURGERY  1999   20 years ago, enlarged prostate   RADIOLOGY WITH ANESTHESIA N/A 08/23/2017   Procedure: IR WITH ANESTHESIA STENT PLACEMENT;  Surgeon: Luanne Bras, MD;  Location: [facility];  Service: Radiology;  Laterality: N/A;   Social History   Occupational History   Occupation: Oceanographer.  Tobacco Use   Smoking status: Former Smoker    Packs/day: 2.00    Years: 25.00    Pack years: 50.00    Types: Cigarettes    Quit date: 03/10/1996    Years since quitting: 24.1   Smokeless tobacco: Never Used  Vaping Use   Vaping Use: Never  used  Substance and Sexual Activity   Alcohol use: No    Alcohol/week: 0.0 standard drinks    Comment: in the past drank, quit 1993   Drug use: No   Sexual activity: Not Currently

## 2020-04-18 ENCOUNTER — Encounter: Payer: Self-pay | Admitting: Orthopedic Surgery

## 2020-04-18 ENCOUNTER — Ambulatory Visit
Admission: RE | Admit: 2020-04-18 | Discharge: 2020-04-18 | Disposition: A | Discharge status: Home / Self Care | Payer: PPO | Source: Ambulatory Visit | Attending: Surgical | Admitting: Surgical

## 2020-04-18 DIAGNOSIS — M79604 Pain in right leg: Secondary | ICD-10-CM

## 2020-04-18 DIAGNOSIS — M79605 Pain in left leg: Secondary | ICD-10-CM

## 2020-04-18 DIAGNOSIS — M25561 Pain in right knee: Secondary | ICD-10-CM | POA: Diagnosis not present

## 2020-04-20 ENCOUNTER — Encounter: Payer: Self-pay | Admitting: Orthopedic Surgery

## 2020-04-20 NOTE — Telephone Encounter (Signed)
Noted  

## 2020-04-22 ENCOUNTER — Telehealth: Payer: Self-pay

## 2020-04-22 NOTE — Telephone Encounter (Signed)
Submitted VOB for Monovisc, bilateral knee.  Pending BV. 

## 2020-04-22 NOTE — Telephone Encounter (Signed)
Talked with patient concerning price of gel injection.

## 2020-04-23 ENCOUNTER — Telehealth: Payer: Self-pay

## 2020-04-23 NOTE — Telephone Encounter (Signed)
Approved for Monovisc, bilateral knee. [place] Patient will be responsible for 20% OOP. Co-pay of $30.00 No PA required  Appt. 05/01/2020 with Lurena Joiner

## 2020-04-29 ENCOUNTER — Other Ambulatory Visit: Payer: Self-pay

## 2020-04-29 ENCOUNTER — Ambulatory Visit (INDEPENDENT_AMBULATORY_CARE_PROVIDER_SITE_OTHER): Payer: PPO | Admitting: Family Medicine

## 2020-04-29 ENCOUNTER — Encounter: Payer: Self-pay | Admitting: Family Medicine

## 2020-04-29 VITALS — BP 142/90 | HR 80 | Temp 98.0°F | Ht 69.0 in | Wt 166.5 lb

## 2020-04-29 DIAGNOSIS — I10 Essential (primary) hypertension: Secondary | ICD-10-CM

## 2020-04-29 DIAGNOSIS — Z0000 Encounter for general adult medical examination without abnormal findings: Secondary | ICD-10-CM | POA: Diagnosis not present

## 2020-04-29 NOTE — Patient Instructions (Signed)
Curcumin or Tumeric  Research has shown that Curcumin (Tumeric) supplements are just as good as high dose anti-inflammatory medications (like diclofenac and ibuprofen) at treating pain from osteoarthritis without the side effects.   Take Curcumin 500 mg Three times daily   -- You can find a bottle at [employer] for $8 which should last a month -- Alden is around $9 and is available at [facility] function - avoid advil or ibuprofen or aleve - tylenol ok

## 2020-04-29 NOTE — Progress Notes (Signed)
Subjective:   John Salazar is a 75 y.o. male who presents for Medicare Annual/Subsequent preventive examination.  Review of Systems    Review of Systems  Constitutional: Negative for chills and fever.  HENT: Negative for congestion and sore throat.   Eyes: Negative for blurred vision and double vision.  Respiratory: Negative for shortness of breath.   Cardiovascular: Negative for chest pain.  Gastrointestinal: Negative for heartburn, nausea and vomiting.  Genitourinary: Negative.   Musculoskeletal: Negative.  Negative for myalgias.  Skin: Negative for rash.  Neurological: Negative for dizziness and headaches.  Endo/Heme/Allergies: Does not bruise/bleed easily.  Psychiatric/Behavioral: Negative for depression. The patient is not nervous/anxious.     Cardiac Risk Factors include: advanced age (>110men, >70 women);dyslipidemia;hypertension;male gender     Objective:    Today's Vitals   04/29/20 1003 04/29/20 1012  BP: (!) 150/82 (!) 142/90  Pulse: 80   Temp: 98 F (36.7 C)   TempSrc: Temporal   SpO2: 96%   Weight: 166 lb 8 oz (75.5 kg)   Height: 5\' 9"  (1.753 m)   PainSc:  5    Body mass index is 24.59 kg/m.  Advanced Directives 04/29/2020 04/14/2020 11/15/2018 11/08/2018 08/23/2017 08/09/2017  Does Patient Have a Medical Advance Directive? Yes Yes Yes Yes Yes No  Type of Advance Directive Living will Living will [place];Living will - Living will -  Does patient want to make changes to medical advance directive? - No - Patient declined - No - Patient declined No - Patient declined -  Would patient like information on creating a medical advance directive? - - - - No - Patient declined No - Patient declined    Current Medications (verified) Outpatient Encounter Medications as of 04/29/2020  Medication Sig  . acetaminophen (TYLENOL) 650 MG CR tablet Take 1,300 mg by mouth every 8 (eight) hours as needed for pain.  Marland Kitchen aspirin EC 81 MG tablet Take 81 mg by  mouth daily.  . clopidogrel (PLAVIX) 75 MG tablet Take 1 tablet (75 mg total) by mouth daily.  Marland Kitchen escitalopram (LEXAPRO) 10 MG tablet TAKE 1 TABLET ONCE DAILY. (Patient taking differently: Take 10 mg by mouth daily.)  . isosorbide mononitrate (IMDUR) 60 MG 24 hr tablet TAKE 1 TABLET BY MOUTH DAILY. (Patient taking differently: Take 60 mg by mouth daily.)  . losartan (COZAAR) 100 MG tablet TAKE 1 TABLET ONCE DAILY. (Patient taking differently: Take 100 mg by mouth daily.)  . nitroGLYCERIN (NITROSTAT) 0.4 MG SL tablet Place 1 tablet (0.4 mg total) under the tongue every 5 (five) minutes as needed for chest pain.  . pantoprazole (PROTONIX) 40 MG tablet TAKE 1 TABLET BY MOUTH DAILY. (Patient taking differently: Take 40 mg by mouth daily.)  . rosuvastatin (CRESTOR) 20 MG tablet TAKE 1 TABLET ONCE DAILY. (Patient taking differently: Take 20 mg by mouth daily.)  . tamsulosin (FLOMAX) 0.4 MG CAPS capsule TAKE 1 CAPSULE 30 MINUTES AFTER THE SAME MEAL EACH DAY ONCE DAY. (Patient taking differently: Take 0.4 mg by mouth daily.)   No facility-administered encounter medications on file as of 04/29/2020.    Allergies (verified) Bee venom and Penicillins   History: Past Medical History:  Diagnosis Date  . Anxiety   . BPH (benign prostatic hyperplasia)   . Chronic kidney disease    stage 2  . Colon polyps   . Erectile dysfunction   . GERD (gastroesophageal reflux disease)   . History of stomach ulcers   . Hypercholesteremia   .  Hypertension   . 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 APPENDECTOMY  1970  . CATARACT EXTRACTION W/ INTRAOCULAR LENS  IMPLANT, BILATERAL    . COLONOSCOPY  10/2012  . ESOPHAGOGASTRODUODENOSCOPY ENDOSCOPY  10/2012  . HERNIA REPAIR    . IR 3D INDEPENDENT WKST  04/14/2020  . IR ANGIO INTRA EXTRACRAN SEL COM CAROTID INNOMINATE BILAT MOD SED  08/09/2017  . IR ANGIO INTRA EXTRACRAN SEL COM CAROTID INNOMINATE BILAT MOD SED  11/08/2018   . IR ANGIO INTRA EXTRACRAN SEL COM CAROTID INNOMINATE BILAT MOD SED  04/14/2020  . IR ANGIO VERTEBRAL SEL VERTEBRAL BILAT MOD SED  08/09/2017  . IR ANGIO VERTEBRAL SEL VERTEBRAL BILAT MOD SED  11/08/2018  . IR ANGIO VERTEBRAL SEL VERTEBRAL BILAT MOD SED  04/14/2020  . IR INTRAVSC STENT CERV CAROTID W/EMB-PROT MOD SED INCL ANGIO  08/23/2017  . IR US GUIDE VASC ACCESS RIGHT  04/14/2020  . LEFT HEART CATH AND CORONARY ANGIOGRAPHY N/A 11/15/2018   Procedure: LEFT HEART CATH AND CORONARY ANGIOGRAPHY;  Surgeon: Nelva Bush, MD;  Location: [facility];  Service: Cardiovascular;  Laterality: N/A;  . PROSTATE SURGERY  1999   20 years ago, enlarged prostate  . RADIOLOGY WITH ANESTHESIA N/A 08/23/2017   Procedure: IR WITH ANESTHESIA STENT PLACEMENT;  Surgeon: Luanne Bras, MD;  Location: [facility];  Service: Radiology;  Laterality: N/A;   Family History  Problem Relation Age of Onset  . Stroke Mother 46  . Aneurysm Father 46   Social History   Socioeconomic History  . Marital status: Married    Spouse name: pamela  . Number of children: 2  . Years of education: 39  . Highest education level: Not on file  Occupational History  . Occupation: Oceanographer.  Tobacco Use  . Smoking status: Former Smoker    Packs/day: 2.00    Years: 25.00    Pack years: 50.00    Types: Cigarettes    Quit date: 03/10/1996    Years since quitting: 24.1  . Smokeless tobacco: Never Used  Vaping Use  . Vaping Use: Never used  Substance and Sexual Activity  . Alcohol use: No    Alcohol/week: 0.0 standard drinks    Comment: in the past drank, quit 1993  . Drug use: No  . Sexual activity: Not Currently  Other Topics Concern  . Not on file  Social History Narrative   Lives with [name] (Wife) - together for 25 years   Children - [name] and [name] -- one is a Marine scientist in [employer] and other works for [employer] - 18, lives in [place]   Enjoys - playing golf, goes to [employer] and sponsors  people as well, church   Support - family, good friends in [place]   Exercise - walks the dog, runs up hills   Diet - improved from before, tries to drink water - low salt or no salt   Social Determinants of Radio broadcast assistant Strain: Not on file  Food Insecurity: Not on file  Transportation Needs: Not on file  Physical Activity: Not on file  Stress: Not on file  Social Connections: Not on file    Tobacco Counseling Counseling given: Not Answered  Social History   Tobacco Use  Smoking Status Former Smoker  . Packs/day: 2.00  . Years: 25.00  . Pack years: 50.00  . Types: Cigarettes  .  Quit date: 03/10/1996  . Years since quitting: 24.1  Smokeless Tobacco Never Used     Clinical Intake:  Pre-visit preparation completed: No  Pain : 0-10 Pain Score: 5  Pain Type: Acute pain Pain Location: Knee Pain Orientation: Right,Left Pain Onset: More than a month ago Pain Frequency: Constant     BMI - recorded: 24.59 Nutritional Status: BMI of 19-24  Normal Nutritional Risks: None Diabetes: No  How often do you need to have someone help you when you read instructions, pamphlets, or other written materials from your doctor or pharmacy?: 1 - Never What is the last grade level you completed in school?: high school  Diabetic? no  Interpreter Needed?: No      Activities of Daily Living In your present state of health, do you have any difficulty performing the following activities: 04/29/2020  Hearing? N  Vision? N  Difficulty concentrating or making decisions? N  Walking or climbing stairs? Y  Comment knee pain - seeing ortho  Dressing or bathing? N  Doing errands, shopping? N  Preparing Food and eating ? N  Using the Toilet? N  In the past six months, have you accidently leaked urine? N  Do you have problems with loss of bowel control? N  Managing your Medications? N  Managing your Finances? N  Housekeeping or managing your Housekeeping? N  Some recent data  might be hidden    Patient Care Team: Lesleigh Noe, MD as PCP - General (Family Medicine) Fay Records, MD as PCP - Cardiology (Cardiology) Marlou Sa Tonna Corner, MD as Consulting Physician (Orthopedic Surgery) Luanne Bras, MD as Consulting Physician (Interventional Radiology) Clarene Essex, MD as Consulting Physician (Gastroenterology)  Indicate any recent Medical Services you may have received from other than [facility] providers in the past year (date may be approximate).     Assessment:   This is a routine wellness examination for [place].  Hearing/Vision screen  Hearing Screening   125Hz  250Hz  500Hz  1000Hz  2000Hz  3000Hz  4000Hz  6000Hz  8000Hz   Right ear:  20 20 20 20   0    Left ear:  0 0 0 0  0    Vision Screening Comments: Last eye exam 12/21 @ [facility]  Dietary issues and exercise activities discussed: Current Exercise Habits: Home exercise routine;Structured exercise class, Type of exercise: walking;strength training/weights, Time (Minutes): 30, Frequency (Times/Week): 7, Weekly Exercise (Minutes/Week): 210, Intensity: Mild, Exercise limited by: cardiac condition(s);orthopedic condition(s)  Goals    . Patient Stated     Treat knee pain      Depression Screen PHQ 2/9 Scores 04/29/2020 04/29/2020 02/27/2019 01/14/2019 01/08/2018  PHQ - 2 Score 0 0 1 2 0  PHQ- 9 Score - - 2 7 -    Fall Risk Fall Risk  04/29/2020 09/03/2019  Falls in the past year? 0 0  Comment - Emmi Telephone Survey: data to providers prior to load  Number falls in past yr: 0 -    FALL RISK PREVENTION PERTAINING TO THE HOME:  Any stairs in or around the home? Yes  If so, are there any without handrails? Yes  Home free of loose throw rugs in walkways, pet beds, electrical cords, etc? No  Adequate lighting in your home to reduce risk of falls? Yes   ASSISTIVE DEVICES UTILIZED TO PREVENT FALLS:  Life alert? No  Use of a cane, walker or w/c? No  Grab bars in the bathroom? No  Shower chair or  bench in shower? Yes  Elevated  toilet seat or a handicapped toilet? Yes     Cognitive Function:       Mini-Cog - 04/29/20 1022    Normal clock drawing test? yes    How many words correct? 2              Immunizations Immunization History  Administered Date(s) Administered  . Fluad Quad(high Dose 65+) 10/04/2018, 12/07/2019  . Influenza, High Dose Seasonal PF 11/07/2017  . PFIZER(Purple Top)SARS-COV-2 Vaccination 03/16/2019, 04/08/2019, 11/23/2019  . Pneumococcal Conjugate-13 07/26/2013  . Pneumococcal Polysaccharide-23 11/19/2009  . Pneumococcal-Unspecified 03/24/2017  . Tdap 11/12/2010  . Zoster Recombinat (Shingrix) 09/27/2018, 12/27/2018    TDAP status: Up to date  Flu Vaccine status: Up to date  Pneumococcal vaccine status: Up to date  Covid-19 vaccine status: Completed vaccines  Qualifies for Shingles Vaccine? Yes   Zostavax completed Yes   Shingrix Completed?: Yes  Screening Tests Health Maintenance  Topic Date Due  . TETANUS/TDAP  11/11/2020  . COLONOSCOPY (Pts 45-75yrs Insurance coverage will need to be confirmed)  09/12/2023  . INFLUENZA VACCINE  Completed  . COVID-19 Vaccine  Completed  . Hepatitis C Screening  Completed  . PNA vac Low Risk Adult  Completed  . HPV VACCINES  Aged Out    Health Maintenance  There are no preventive care reminders to display for this patient.  Colorectal cancer screening: Type of screening: Colonoscopy. Completed 2020. Repeat every 5 years  Lung Cancer Screening: (Low Dose CT Chest recommended if Age 73-80 years, 30 pack-year currently smoking OR have quit w/in 15years.) does not qualify.   Lung Cancer Screening Referral: n/a  Additional Screening:  Hepatitis C Screening: does qualify; Completed 2019  Vision Screening: Recommended annual ophthalmology exams for early detection of glaucoma and other disorders of the eye. Is the patient up to date with their annual eye exam?  Yes  Who is the provider or what  is the name of the office in which the patient attends annual eye exams? Cannot recall his name If pt is not established with a provider, would they like to be referred to a provider to establish care? n/a.   Dental Screening: Recommended annual dental exams for proper oral hygiene  Community Resource Referral / Chronic Care Management: CRR required this visit?  No   CCM required this visit?  No      Plan:     Problem List Items Addressed This Visit      Cardiovascular and Mediastinum   Benign essential HTN    BP slightly elevated. Pt will do home monitoring and send numbers in 1-2 weeks. Losartan 100 mg       Other Visit Diagnoses    Encounter for Medicare annual wellness exam    -  Primary       I have personally reviewed and noted the following in the patient's chart:   . Medical and social history . Use of alcohol, tobacco or illicit drugs  . Current medications and supplements . Functional ability and status . Nutritional status . Physical activity . Advanced directives . List of other physicians . Hospitalizations, surgeries, and ER visits in previous 12 months . Vitals . Screenings to include cognitive, depression, and falls . Referrals and appointments  In addition, I have reviewed and discussed with patient certain preventive protocols, quality metrics, and best practice recommendations. A written personalized care plan for preventive services as well as general preventive health recommendations were provided to patient.     Jobe Marker  Einar Pheasant, MD   04/29/2020

## 2020-04-29 NOTE — Assessment & Plan Note (Signed)
BP slightly elevated. Pt will do home monitoring and send numbers in 1-2 weeks. Losartan 100 mg

## 2020-04-30 ENCOUNTER — Encounter: Payer: Self-pay | Admitting: Rehabilitative and Restorative Service Providers"

## 2020-04-30 ENCOUNTER — Ambulatory Visit (HOSPITAL_COMMUNITY)
Admission: RE | Admit: 2020-04-30 | Discharge: 2020-04-30 | Disposition: A | Discharge status: Home / Self Care | Payer: PPO | Source: Ambulatory Visit | Attending: Interventional Radiology | Admitting: Interventional Radiology

## 2020-04-30 ENCOUNTER — Ambulatory Visit: Payer: PPO | Admitting: Rehabilitative and Restorative Service Providers"

## 2020-04-30 DIAGNOSIS — R262 Difficulty in walking, not elsewhere classified: Secondary | ICD-10-CM

## 2020-04-30 DIAGNOSIS — I671 Cerebral aneurysm, nonruptured: Secondary | ICD-10-CM

## 2020-04-30 DIAGNOSIS — M6281 Muscle weakness (generalized): Secondary | ICD-10-CM | POA: Diagnosis not present

## 2020-04-30 DIAGNOSIS — I771 Stricture of artery: Secondary | ICD-10-CM

## 2020-04-30 DIAGNOSIS — M25562 Pain in left knee: Secondary | ICD-10-CM

## 2020-04-30 DIAGNOSIS — M25561 Pain in right knee: Secondary | ICD-10-CM | POA: Diagnosis not present

## 2020-04-30 DIAGNOSIS — G8929 Other chronic pain: Secondary | ICD-10-CM | POA: Diagnosis not present

## 2020-04-30 DIAGNOSIS — R600 Localized edema: Secondary | ICD-10-CM | POA: Diagnosis not present

## 2020-04-30 NOTE — Patient Instructions (Signed)
Access Code: [email or web address] URL: [email or web address] Date: 04/30/2020 Prepared by: Vista Mink  Exercises Supine Quadricep Sets - 3-5 x daily - 7 x weekly - 3 sets - 10 reps - 5 seconds hold Small Range Straight Leg Raise - 2-3 x daily - 7 x weekly - 3-5 sets - 5 reps - 3 seconds hold

## 2020-04-30 NOTE — Therapy (Addendum)
[facility] [address] [place], [place], [address] Phone: [phone]   Fax:  [phone]  Physical Therapy Evaluation/Discharge   Patient Details  Name: John Salazar MRN: [id number] Date of Birth: May 03, 1945 Referring Provider (PT): Gerrianne Scale Magnant PA-C   Encounter Date: 04/30/2020   PT End of Session - 04/30/20 1729     Visit Number 1    Number of Visits 16    Date for PT Re-Evaluation 05/28/20    PT Start Time 0845    PT Stop Time 0930    PT Time Calculation (min) 45 min    Activity Tolerance Patient tolerated treatment well;No increased pain;Patient limited by fatigue    Behavior During Therapy WFL for tasks assessed/performed             Past Medical History:  Diagnosis Date   Anxiety    BPH (benign prostatic hyperplasia)    Chronic kidney disease    stage 2   Colon polyps    Erectile dysfunction    GERD (gastroesophageal reflux disease)    History of stomach ulcers    Hypercholesteremia    Hypertension    Positive TB test    in the past    Past Surgical History:  Procedure Laterality Date   APPENDECTOMY  1970   CATARACT EXTRACTION W/ INTRAOCULAR LENS  IMPLANT, BILATERAL     COLONOSCOPY  10/2012   ESOPHAGOGASTRODUODENOSCOPY ENDOSCOPY  10/2012   HERNIA REPAIR     IR 3D INDEPENDENT WKST  04/14/2020   IR ANGIO INTRA EXTRACRAN SEL COM CAROTID INNOMINATE BILAT MOD SED  08/09/2017   IR ANGIO INTRA EXTRACRAN SEL COM CAROTID INNOMINATE BILAT MOD SED  11/08/2018   IR ANGIO INTRA EXTRACRAN SEL COM CAROTID INNOMINATE BILAT MOD SED  04/14/2020   IR ANGIO VERTEBRAL SEL VERTEBRAL BILAT MOD SED  08/09/2017   IR ANGIO VERTEBRAL SEL VERTEBRAL BILAT MOD SED  11/08/2018   IR ANGIO VERTEBRAL SEL VERTEBRAL BILAT MOD SED  04/14/2020   IR INTRAVSC STENT CERV CAROTID W/EMB-PROT MOD SED INCL ANGIO  08/23/2017   IR US GUIDE VASC ACCESS RIGHT  04/14/2020   LEFT HEART CATH AND CORONARY ANGIOGRAPHY N/A 11/15/2018   Procedure: LEFT HEART CATH AND  CORONARY ANGIOGRAPHY;  Surgeon: Nelva Bush, MD;  Location: [facility];  Service: Cardiovascular;  Laterality: N/A;   PROSTATE SURGERY  1999   20 years ago, enlarged prostate   RADIOLOGY WITH ANESTHESIA N/A 08/23/2017   Procedure: IR WITH ANESTHESIA STENT PLACEMENT;  Surgeon: Luanne Bras, MD;  Location: [facility];  Service: Radiology;  Laterality: N/A;    There were no vitals filed for this visit.    Subjective Assessment - 04/30/20 1132     Subjective Melroy has had B knee pain for years.  Pain to the point he has difficulty with stairs and he asked the doctor about physical therapy, injections and possible arthroscopy.  He will be getting "gel shots" tomorrow and participating in physical therapy for the next 4 weeks.    Pertinent History HTN, long time smoker, cardiac disease, stage 2 kidney disease    Limitations Sitting;House hold activities;Standing;Walking    How long can you sit comfortably? Knees get stiff with sitting > 30 minutes    How long can you stand comfortably? 5-10 minutes    How long can you walk comfortably? 5-10 minutes    Diagnostic tests R knee MRI shows degenerative changes and meniscus tear    Patient Stated Goals Less  knee pain, walk better, do better with stairs    Currently in Pain? Yes    Pain Score 5     Pain Location Knee    Pain Orientation Right;Left    Pain Descriptors / Indicators Aching;Sharp    Pain Type Chronic pain    Pain Radiating Towards NA    Pain Onset More than a month ago    Pain Frequency Constant    Aggravating Factors  Too much sitting, standing or walking    Pain Relieving Factors Some help with meds    Effect of Pain on Daily Activities Stairs and prolonged postures (sit, stand and walk) are difficult    Multiple Pain Sites No                [facility] PT Assessment - 04/30/20 0001       Assessment   Medical Diagnosis B knees OA (R > L)    Referring Provider (PT) Gerrianne Scale Magnant PA-C    Onset Date/Surgical  Date --   Chronic problem (OA)   Next MD Visit Tomorrow for injections (Synvisc)      Balance Screen   Has the patient fallen in the past 6 months No    Has the patient had a decrease in activity level because of a fear of falling?  No    Is the patient reluctant to leave their home because of a fear of falling?  No      Home Ecologist residence      Prior Function   Level of Independence Independent      Cognition   Overall Cognitive Status Within Functional Limits for tasks assessed      Observation/Other Assessments   Focus on Therapeutic Outcomes (FOTO)  49 (Goal 65)      ROM / Strength   AROM / PROM / Strength AROM;Strength      AROM   Overall AROM  Deficits    AROM Assessment Site Knee    Right/Left Knee Left;Right    Right Knee Extension 0    Right Knee Flexion 150    Left Knee Extension 0    Left Knee Flexion 145      Strength   Overall Strength Deficits    Strength Assessment Site Knee    Right/Left Knee Left;Right    Right Knee Extension 4-/5    Left Knee Extension 4/5                        Objective measurements completed on examination: See above findings.       [facility] PT Treatment/Exercise - 04/30/20 0001       Exercises   Exercises Knee/Hip      Knee/Hip Exercises: Seated   Long Arc Quad Strengthening;Both;4 sets;5 reps;Other (comment)   Seated straight leg raises with 4-5 pillows in chair     Knee/Hip Exercises: Supine   Quad Sets Strengthening;Both;2 sets;10 reps;Other (comment)   5 seconds (dorsiflex ankles, push down knees, tighten thighs)                   PT Education - 04/30/20 1728     Education Details Reviewed exam findings and started quadriceps strength focused HEP.    Person(s) Educated Patient    Methods Explanation;Demonstration;Verbal cues;Tactile cues;Handout    Comprehension Verbal cues required;Returned demonstration;Need further instruction;Verbalized  understanding;Tactile cues required  PT Short Term Goals - 04/30/20 1734       PT SHORT TERM GOAL #1   Title Levar will be independent with his starter HEP.    Time 2    Period Weeks    Status New    Target Date 05/15/20               PT Long Term Goals - 04/30/20 1734       PT LONG TERM GOAL #1   Title Coye will report improved function on FOTO to 65.    Baseline 49    Time 8    Period Weeks    Status New    Target Date 06/25/20      PT LONG TERM GOAL #2   Title Itay will report B knee pain consistently 0-3/10 on the Numeric Pain Rating Scale.    Baseline Can be 7+/10    Time 8    Period Weeks    Status New    Target Date 06/25/20      PT LONG TERM GOAL #3   Title Ashlee will be independent with his long-term DC HEP.    Time 8    Period Weeks    Status New    Target Date 06/25/20                    Plan - 04/30/20 1729     Clinical Impression Statement Jyden has significant B knee degenerative changes (R > L).  He is getting "gel shots" in his R knee tomorrow.  His knee pain is so severe he avoids stairs and he has go up stairs in a modified (hip ER) way.  He will benefit from quadriceps strength work to reduce joint pain, improve WB function (including stairs) and prepare for or avoid knee surgery.    Personal Factors and Comorbidities Comorbidity 1    Comorbidities HTN, long-time smoker, stage 2 kidney disease, cardiac history    Examination-Activity Limitations Transfers;Squat;Lift;Bend;Locomotion Level;Stairs;Stand    Examination-Participation Restrictions Interpersonal Relationship;Cleaning;Community Activity;Driving    Stability/Clinical Decision Making Stable/Uncomplicated    Clinical Decision Making Low    Rehab Potential Good    PT Frequency 2x / week    PT Duration 8 weeks    PT Treatment/Interventions ADLs/Self Care Home Management;Moist Heat;Iontophoresis 5m/ml Dexamethasone;Electrical  Stimulation;Cryotherapy;Therapeutic activities;Stair training;Gait training;Therapeutic exercise;Neuromuscular re-education;Patient/family education;Manual techniques;Vasopneumatic Device    PT Next Visit Plan BFR, Quadriceps strengthening (Leg press, single and double leg), review HEP    PT Home Exercise Plan Access Code: [email or web address]    Consulted and Agree with Plan of Care Patient             Patient will benefit from skilled therapeutic intervention in order to improve the following deficits and impairments:  Abnormal gait,Decreased activity tolerance,Decreased endurance,Decreased strength,Difficulty walking,Increased edema,Pain  Visit Diagnosis: Difficulty walking  Localized edema  Muscle weakness (generalized)  Chronic pain of left knee  Chronic pain of right knee     Problem List Patient Active Problem List   Diagnosis Date Noted   Depression, major, single episode, mild ([facility] 02/27/2019   Snoring 02/27/2019   Accelerating angina ([facility] 11/15/2018   DNR (do not resuscitate) 08/23/2018   Hx of cataract surgery 08/23/2018   History of right common carotid artery stent placement 06/18/2018   Screening for prostate cancer 06/18/2018   Prediabetes 03/19/2018   CKD (chronic kidney disease) stage 3, GFR 30-59 ml/min ([facility] 02/05/2018   Barrett esophagus 01/08/2018   Benign  essential HTN 01/08/2018   BPH (benign prostatic hyperplasia) 01/08/2018   HLD (hyperlipidemia) 01/08/2018   Anemia 01/08/2018   Carotid stenosis, symptomatic w/o infarct, right 08/23/2017    Farley Ly PT, MPT 04/30/2020, 5:37 PM  PHYSICAL THERAPY DISCHARGE SUMMARY  Visits from Start of Care: 1  Current functional level related to goals / functional outcomes: See note   Remaining deficits: See note   Education / Equipment: HEP   Patient agrees to discharge. Patient goals were not met. Patient is being discharged due to not returning since the last visit.  Scot Jun, PT, DPT, OCS,  ATC 07/28/20  1:57 PM     [facility] [address] [place], [place], [address] Phone: [phone]   Fax:  [phone]  Name: CACE OSORTO MRN: [id number] Date of Birth: April 15, 1945

## 2020-05-01 ENCOUNTER — Ambulatory Visit: Payer: PPO | Admitting: Surgical

## 2020-05-01 DIAGNOSIS — M170 Bilateral primary osteoarthritis of knee: Secondary | ICD-10-CM

## 2020-05-02 ENCOUNTER — Encounter: Payer: Self-pay | Admitting: Surgical

## 2020-05-02 DIAGNOSIS — M170 Bilateral primary osteoarthritis of knee: Secondary | ICD-10-CM

## 2020-05-02 MED ORDER — LIDOCAINE HCL 1 % IJ SOLN
5.0000 mL | INTRAMUSCULAR | Status: AC | PRN
  Administered 2020-05-02: 5 mL

## 2020-05-02 MED ORDER — HYALURONAN 88 MG/4ML IX SOSY
88.0000 mg | PREFILLED_SYRINGE | INTRA_ARTICULAR | Status: AC | PRN
  Administered 2020-05-02: 88 mg via INTRA_ARTICULAR

## 2020-05-02 NOTE — Progress Notes (Signed)
Office Visit Note   Patient: John Salazar           Date of Birth: 08/15/1945           MRN: [id number] Visit Date: 05/01/2020 Requested by: Lesleigh Noe, MD [place],   [id number] PCP: Lesleigh Noe, MD  Subjective: Chief Complaint  Patient presents with  . Left Knee - Pain  . Right Knee - Pain    HPI: John Salazar is a 75 y.o. male who presents to the office complaining of bilateral knee pain, right greater than left.  He returns to discuss MRI results.  He is also here for bilateral knee Monovisc injections.  He has had his first session of physical therapy and feels that this will be helpful.  He is excited to continue with therapy exercises.  No significant change in his knee pain since his prior office visit..                ROS: All systems reviewed are negative as they relate to the chief complaint within the history of present illness.  Patient denies fevers or chills.  Assessment & Plan: Visit Diagnoses:  1. Primary osteoarthritis of knees, bilateral     Plan: Patient is a 75 year old male who presents complaining of bilateral knee pain.  Right knee is bothering him the most.  He had MRI of his right knee that was reviewed today which revealed medial meniscal degeneration with meniscal extrusion, partial-thickness cartilage loss of the medial patellar facet with marrow edema, partial-thickness cartilage loss of the medial compartment with peripheral subchondral marrow edema.  He has had cortisone injections in the past which are providing less and less relief.  They last about 1 month.  He has not tried gel injections.  He is here today for bilateral knee Monovisc injections.  Tolerated these injections well.  He will continue with physical therapy for his knees and see how the gel injections help.  Follow-up in 6 weeks with Dr. Marlou Sa.  Follow-Up Instructions: No follow-ups on file.   Orders:  No orders of the defined types were placed in this  encounter.  No orders of the defined types were placed in this encounter.     Procedures: Large Joint Inj: bilateral knee on 05/02/2020 10:17 AM Indications: diagnostic evaluation, joint swelling and pain Details: 18 G 1.5 in needle, superolateral approach  Arthrogram: No  Medications (Right): 5 mL lidocaine 1 %; 88 mg Hyaluronan 88 MG/4ML Medications (Left): 5 mL lidocaine 1 %; 88 mg Hyaluronan 88 MG/4ML Outcome: tolerated well, no immediate complications Procedure, treatment alternatives, risks and benefits explained, specific risks discussed. Consent was given by the patient. Immediately prior to procedure a time out was called to verify the correct patient, procedure, equipment, support staff and site/side marked as required. Patient was prepped and draped in the usual sterile fashion.       Clinical Data: No additional findings.  Objective: Vital Signs: There were no vitals taken for this visit.  Physical Exam:  Constitutional: Patient appears well-developed HEENT:  Head: Normocephalic Eyes:EOM are normal Neck: Normal range of motion Cardiovascular: Normal rate Pulmonary/chest: Effort normal Neurologic: Patient is alert Skin: Skin is warm Psychiatric: Patient has normal mood and affect  Ortho Exam: Ortho exam demonstrates right knee without effusion.  Left knee without effusion.  Tenderness over the medial joint line of bilateral knees.  No tenderness over the lateral joint line, patella, quadricep, patellar tendon.  Able to perform straight leg raise bilaterally.  Crepitus behind the patella with passive motion of the right knee.  No calf tenderness bilaterally.  Negative Homans' sign bilaterally.  Specialty Comments:  No specialty comments available.  Imaging: No results found.   PMFS History: Patient Active Problem List   Diagnosis Date Noted  . Depression, major, single episode, mild ([place]) 02/27/2019  . Snoring 02/27/2019  . Accelerating angina ([place])  11/15/2018  . DNR (do not resuscitate) 08/23/2018  . Hx of cataract surgery 08/23/2018  . History of right common carotid artery stent placement 06/18/2018  . Screening for prostate cancer 06/18/2018  . Prediabetes 03/19/2018  . CKD (chronic kidney disease) stage 3, GFR 30-59 ml/min ([facility]) 02/05/2018  . Barrett esophagus 01/08/2018  . Benign essential HTN 01/08/2018  . BPH (benign prostatic hyperplasia) 01/08/2018  . HLD (hyperlipidemia) 01/08/2018  . Anemia 01/08/2018  . Carotid stenosis, symptomatic w/o infarct, right 08/23/2017   Past Medical History:  Diagnosis Date  . Anxiety   . BPH (benign prostatic hyperplasia)   . Chronic kidney disease    stage 2  . Colon polyps   . Erectile dysfunction   . GERD (gastroesophageal reflux disease)   . History of stomach ulcers   . Hypercholesteremia   . Hypertension   . Positive TB test    in the past    Family History  Problem Relation Age of Onset  . Stroke Mother 17  . Aneurysm Father 53    Past Surgical History:  Procedure Laterality Date  . APPENDECTOMY  1970  . CATARACT EXTRACTION W/ INTRAOCULAR LENS  IMPLANT, BILATERAL    . COLONOSCOPY  10/2012  . ESOPHAGOGASTRODUODENOSCOPY ENDOSCOPY  10/2012  . HERNIA REPAIR    . IR 3D INDEPENDENT WKST  04/14/2020  . IR ANGIO INTRA EXTRACRAN SEL COM CAROTID INNOMINATE BILAT MOD SED  08/09/2017  . IR ANGIO INTRA EXTRACRAN SEL COM CAROTID INNOMINATE BILAT MOD SED  11/08/2018  . IR ANGIO INTRA EXTRACRAN SEL COM CAROTID INNOMINATE BILAT MOD SED  04/14/2020  . IR ANGIO VERTEBRAL SEL VERTEBRAL BILAT MOD SED  08/09/2017  . IR ANGIO VERTEBRAL SEL VERTEBRAL BILAT MOD SED  11/08/2018  . IR ANGIO VERTEBRAL SEL VERTEBRAL BILAT MOD SED  04/14/2020  . IR INTRAVSC STENT CERV CAROTID W/EMB-PROT MOD SED INCL ANGIO  08/23/2017  . IR US GUIDE VASC ACCESS RIGHT  04/14/2020  . LEFT HEART CATH AND CORONARY ANGIOGRAPHY N/A 11/15/2018   Procedure: LEFT HEART CATH AND CORONARY ANGIOGRAPHY;  Surgeon: Nelva Bush, MD;   Location: [facility];  Service: Cardiovascular;  Laterality: N/A;  . PROSTATE SURGERY  1999   20 years ago, enlarged prostate  . RADIOLOGY WITH ANESTHESIA N/A 08/23/2017   Procedure: IR WITH ANESTHESIA STENT PLACEMENT;  Surgeon: Luanne Bras, MD;  Location: [facility];  Service: Radiology;  Laterality: N/A;   Social History   Occupational History  . Occupation: Oceanographer.  Tobacco Use  . Smoking status: Former Smoker    Packs/day: 2.00    Years: 25.00    Pack years: 50.00    Types: Cigarettes    Quit date: 03/10/1996    Years since quitting: 24.1  . Smokeless tobacco: Never Used  Vaping Use  . Vaping Use: Never used  Substance and Sexual Activity  . Alcohol use: No    Alcohol/week: 0.0 standard drinks    Comment: in the past drank, quit 1993  . Drug use: No  . Sexual activity: Not Currently

## 2020-05-04 ENCOUNTER — Emergency Department (HOSPITAL_COMMUNITY): Payer: PPO

## 2020-05-04 ENCOUNTER — Emergency Department (HOSPITAL_COMMUNITY)
Admission: EM | Admit: 2020-05-04 | Discharge: 2020-05-04 | Disposition: A | Discharge status: Home / Self Care | Payer: PPO | Attending: Emergency Medicine | Admitting: Emergency Medicine

## 2020-05-04 ENCOUNTER — Other Ambulatory Visit: Payer: Self-pay

## 2020-05-04 DIAGNOSIS — I700 Atherosclerosis of aorta: Secondary | ICD-10-CM | POA: Diagnosis not present

## 2020-05-04 DIAGNOSIS — R072 Precordial pain: Secondary | ICD-10-CM | POA: Diagnosis not present

## 2020-05-04 DIAGNOSIS — Z9862 Peripheral vascular angioplasty status: Secondary | ICD-10-CM | POA: Insufficient documentation

## 2020-05-04 DIAGNOSIS — Z79899 Other long term (current) drug therapy: Secondary | ICD-10-CM | POA: Diagnosis not present

## 2020-05-04 DIAGNOSIS — Z87891 Personal history of nicotine dependence: Secondary | ICD-10-CM | POA: Diagnosis not present

## 2020-05-04 DIAGNOSIS — Z20822 Contact with and (suspected) exposure to covid-19: Secondary | ICD-10-CM | POA: Insufficient documentation

## 2020-05-04 DIAGNOSIS — I708 Atherosclerosis of other arteries: Secondary | ICD-10-CM | POA: Diagnosis not present

## 2020-05-04 DIAGNOSIS — I10 Essential (primary) hypertension: Secondary | ICD-10-CM | POA: Diagnosis not present

## 2020-05-04 DIAGNOSIS — R109 Unspecified abdominal pain: Secondary | ICD-10-CM | POA: Diagnosis not present

## 2020-05-04 DIAGNOSIS — R0789 Other chest pain: Secondary | ICD-10-CM | POA: Diagnosis not present

## 2020-05-04 DIAGNOSIS — K7689 Other specified diseases of liver: Secondary | ICD-10-CM | POA: Diagnosis not present

## 2020-05-04 DIAGNOSIS — K219 Gastro-esophageal reflux disease without esophagitis: Secondary | ICD-10-CM | POA: Diagnosis not present

## 2020-05-04 DIAGNOSIS — R079 Chest pain, unspecified: Secondary | ICD-10-CM

## 2020-05-04 DIAGNOSIS — R030 Elevated blood-pressure reading, without diagnosis of hypertension: Secondary | ICD-10-CM

## 2020-05-04 DIAGNOSIS — I129 Hypertensive chronic kidney disease with stage 1 through stage 4 chronic kidney disease, or unspecified chronic kidney disease: Secondary | ICD-10-CM | POA: Insufficient documentation

## 2020-05-04 DIAGNOSIS — N183 Chronic kidney disease, stage 3 unspecified: Secondary | ICD-10-CM | POA: Diagnosis not present

## 2020-05-04 DIAGNOSIS — I2510 Atherosclerotic heart disease of native coronary artery without angina pectoris: Secondary | ICD-10-CM | POA: Diagnosis not present

## 2020-05-04 DIAGNOSIS — Z7902 Long term (current) use of antithrombotics/antiplatelets: Secondary | ICD-10-CM | POA: Insufficient documentation

## 2020-05-04 DIAGNOSIS — Z7982 Long term (current) use of aspirin: Secondary | ICD-10-CM | POA: Diagnosis not present

## 2020-05-04 DIAGNOSIS — J9811 Atelectasis: Secondary | ICD-10-CM | POA: Diagnosis not present

## 2020-05-04 DIAGNOSIS — J438 Other emphysema: Secondary | ICD-10-CM | POA: Diagnosis not present

## 2020-05-04 DIAGNOSIS — K5730 Diverticulosis of large intestine without perforation or abscess without bleeding: Secondary | ICD-10-CM | POA: Diagnosis not present

## 2020-05-04 DIAGNOSIS — I701 Atherosclerosis of renal artery: Secondary | ICD-10-CM | POA: Diagnosis not present

## 2020-05-04 LAB — TROPONIN I (HIGH SENSITIVITY)
Troponin I (High Sensitivity): 6 ng/L (ref <18)
Troponin I (High Sensitivity): 7 ng/L (ref <18)

## 2020-05-04 LAB — CBC WITH DIFFERENTIAL/PLATELET
Abs Immature Granulocytes: 0.02 10*3/uL (ref 0.00–0.07)
Basophils Absolute: 0 10*3/uL (ref 0.0–0.1)
Basophils Relative: 1 %
Eosinophils Absolute: 0.1 10*3/uL (ref 0.0–0.5)
Eosinophils Relative: 3 %
HCT: 38.1 % — ABNORMAL LOW (ref 39.0–52.0)
Hemoglobin: 13 g/dL (ref 13.0–17.0)
Immature Granulocytes: 0 %
Lymphocytes Relative: 28 %
Lymphs Abs: 1.2 10*3/uL (ref 0.7–4.0)
MCH: 31.3 pg (ref 26.0–34.0)
MCHC: 34.1 g/dL (ref 30.0–36.0)
MCV: 91.8 fL (ref 80.0–100.0)
Monocytes Absolute: 0.5 10*3/uL (ref 0.1–1.0)
Monocytes Relative: 12 %
Neutro Abs: 2.5 10*3/uL (ref 1.7–7.7)
Neutrophils Relative %: 56 %
Platelets: 164 10*3/uL (ref 150–400)
RBC: 4.15 MIL/uL — ABNORMAL LOW (ref 4.22–5.81)
RDW: 12.9 % (ref 11.5–15.5)
WBC: 4.5 10*3/uL (ref 4.0–10.5)
nRBC: 0 % (ref 0.0–0.2)

## 2020-05-04 LAB — COMPREHENSIVE METABOLIC PANEL
ALT: 13 U/L (ref 0–44)
AST: 18 U/L (ref 15–41)
Albumin: 3.7 g/dL (ref 3.5–5.0)
Alkaline Phosphatase: 73 U/L (ref 38–126)
Anion gap: 4 — ABNORMAL LOW (ref 5–15)
BUN: 18 mg/dL (ref 8–23)
CO2: 29 mmol/L (ref 22–32)
Calcium: 9.5 mg/dL (ref 8.9–10.3)
Chloride: 104 mmol/L (ref 98–111)
Creatinine, Ser: 1.68 mg/dL — ABNORMAL HIGH (ref 0.61–1.24)
GFR, Estimated: 42 mL/min — ABNORMAL LOW (ref >60)
Glucose, Bld: 108 mg/dL — ABNORMAL HIGH (ref 70–99)
Potassium: 3.8 mmol/L (ref 3.5–5.1)
Sodium: 137 mmol/L (ref 135–145)
Total Bilirubin: 0.6 mg/dL (ref 0.3–1.2)
Total Protein: 6.6 g/dL (ref 6.5–8.1)

## 2020-05-04 LAB — RESP PANEL BY RT-PCR (FLU A&B, COVID) ARPGX2
Influenza A by PCR: NEGATIVE
Influenza B by PCR: NEGATIVE
SARS Coronavirus 2 by RT PCR: NEGATIVE

## 2020-05-04 LAB — LACTIC ACID, PLASMA: Lactic Acid, Venous: 1.1 mmol/L (ref 0.5–1.9)

## 2020-05-04 MED ORDER — NITROGLYCERIN 0.4 MG SL SUBL
0.4000 mg | SUBLINGUAL_TABLET | SUBLINGUAL | Status: DC | PRN
  Administered 2020-05-04: 0.4 mg via SUBLINGUAL
  Filled 2020-05-04: qty 1

## 2020-05-04 MED ORDER — IOHEXOL 350 MG/ML SOLN
60.0000 mL | Freq: Once | INTRAVENOUS | Status: AC | PRN
  Administered 2020-05-04: 60 mL via INTRAVENOUS

## 2020-05-04 NOTE — Discharge Instructions (Addendum)
Your work-up today was overall reassuring.  With the pain going to your back, we did the CT scan to look for aortic dissection or aneurysm and they did not see this.  We did see some of the cysts and calcifications as we discussed.  Please follow-up with your primary doctor for further management of your blood pressure that has been up and down throughout the visit today and please follow-up with your cardiologist for the chest discomfort.  Your heart enzymes were negative both times we checked them.  Please rest and stay hydrated.  If any symptoms change or worsen, please return to the nearest emergency department.

## 2020-05-04 NOTE — ED Triage Notes (Signed)
Pt BIB [facility] for 4/10 non-radiating CP that began around 1300 today.  Pt has hx of angina and previously diagnosed blockages with no interventions.  PT also has stents in his carotid. Pt tried Gas-X and his personal nitro with no relief.  EMs reports reproducible pain when pressing on epigastric area.     EMS gave 324 ASA and 1 nitro with no relief.

## 2020-05-04 NOTE — ED Provider Notes (Signed)
[name]   CSN: [id number] Arrival date & time: 05/04/20  1813     History No chief complaint on file.   John Salazar is a 75 y.o. male.  The history is provided by the patient and medical records. No language interpreter was used.  Chest Pain Pain location:  Substernal area and epigastric Pain quality: dull   Pain radiates to:  Mid back Pain severity:  Moderate Onset quality:  Gradual Duration:  3 hours Timing:  Constant Progression:  Improving Chronicity:  New Relieved by:  Nothing Worsened by:  Exertion Ineffective treatments:  None tried Associated symptoms: abdominal pain and back pain   Associated symptoms: no altered mental status, no anxiety, no cough, no diaphoresis, no fatigue, no fever, no headache, no lower extremity edema, no nausea, no near-syncope, no numbness, no palpitations, no shortness of breath, no syncope, no vomiting and no weakness   Risk factors: coronary artery disease        Past Medical History:  Diagnosis Date  . Anxiety   . BPH (benign prostatic hyperplasia)   . Chronic kidney disease    stage 2  . Colon polyps   . Erectile dysfunction   . GERD (gastroesophageal reflux disease)   . History of stomach ulcers   . Hypercholesteremia   . Hypertension   . Positive TB test    in the past    Patient Active Problem List   Diagnosis Date Noted  . Depression, major, single episode, mild ([place]) 02/27/2019  . Snoring 02/27/2019  . Accelerating angina ([place]) 11/15/2018  . DNR (do not resuscitate) 08/23/2018  . Hx of cataract surgery 08/23/2018  . History of right common carotid artery stent placement 06/18/2018  . Screening for prostate cancer 06/18/2018  . Prediabetes 03/19/2018  . CKD (chronic kidney disease) stage 3, GFR 30-59 ml/min ([facility]) 02/05/2018  . Barrett esophagus 01/08/2018  . Benign essential HTN 01/08/2018  . BPH (benign prostatic hyperplasia) 01/08/2018  . HLD  (hyperlipidemia) 01/08/2018  . Anemia 01/08/2018  . Carotid stenosis, symptomatic w/o infarct, right 08/23/2017    Past Surgical History:  Procedure Laterality Date  . APPENDECTOMY  1970  . CATARACT EXTRACTION W/ INTRAOCULAR LENS  IMPLANT, BILATERAL    . COLONOSCOPY  10/2012  . ESOPHAGOGASTRODUODENOSCOPY ENDOSCOPY  10/2012  . HERNIA REPAIR    . IR 3D INDEPENDENT WKST  04/14/2020  . IR ANGIO INTRA EXTRACRAN SEL COM CAROTID INNOMINATE BILAT MOD SED  08/09/2017  . IR ANGIO INTRA EXTRACRAN SEL COM CAROTID INNOMINATE BILAT MOD SED  11/08/2018  . IR ANGIO INTRA EXTRACRAN SEL COM CAROTID INNOMINATE BILAT MOD SED  04/14/2020  . IR ANGIO VERTEBRAL SEL VERTEBRAL BILAT MOD SED  08/09/2017  . IR ANGIO VERTEBRAL SEL VERTEBRAL BILAT MOD SED  11/08/2018  . IR ANGIO VERTEBRAL SEL VERTEBRAL BILAT MOD SED  04/14/2020  . IR INTRAVSC STENT CERV CAROTID W/EMB-PROT MOD SED INCL ANGIO  08/23/2017  . IR US GUIDE VASC ACCESS RIGHT  04/14/2020  . LEFT HEART CATH AND CORONARY ANGIOGRAPHY N/A 11/15/2018   Procedure: LEFT HEART CATH AND CORONARY ANGIOGRAPHY;  Surgeon: Nelva Bush, MD;  Location: [facility];  Service: Cardiovascular;  Laterality: N/A;  . PROSTATE SURGERY  1999   20 years ago, enlarged prostate  . RADIOLOGY WITH ANESTHESIA N/A 08/23/2017   Procedure: IR WITH ANESTHESIA STENT PLACEMENT;  Surgeon: Luanne Bras, MD;  Location: [facility];  Service: Radiology;  Laterality: N/A;  Family History  Problem Relation Age of Onset  . Stroke Mother 25  . Aneurysm Father 41    Social History   Tobacco Use  . Smoking status: Former Smoker    Packs/day: 2.00    Years: 25.00    Pack years: 50.00    Types: Cigarettes    Quit date: 03/10/1996    Years since quitting: 24.1  . Smokeless tobacco: Never Used  Vaping Use  . Vaping Use: Never used  Substance Use Topics  . Alcohol use: No    Alcohol/week: 0.0 standard drinks    Comment: in the past drank, quit 1993  . Drug use: No    Home  Medications Prior to Admission medications   Medication Sig Start Date End Date Taking? Authorizing Provider  acetaminophen (TYLENOL) 650 MG CR tablet Take 1,300 mg by mouth every 8 (eight) hours as needed for pain.    [provider]  aspirin EC 81 MG tablet Take 81 mg by mouth daily.    [provider]  clopidogrel (PLAVIX) 75 MG tablet Take 1 tablet (75 mg total) by mouth daily. 04/13/18   Ascencion Dike, PA-C  escitalopram (LEXAPRO) 10 MG tablet TAKE 1 TABLET ONCE DAILY. Patient taking differently: Take 10 mg by mouth daily. 10/07/19   Lesleigh Noe, MD  isosorbide mononitrate (IMDUR) 60 MG 24 hr tablet TAKE 1 TABLET BY MOUTH DAILY. Patient taking differently: Take 60 mg by mouth daily. 10/09/19   Bhagat, Crista Luria, PA  losartan (COZAAR) 100 MG tablet TAKE 1 TABLET ONCE DAILY. Patient taking differently: Take 100 mg by mouth daily. 04/07/20   Lesleigh Noe, MD  nitroGLYCERIN (NITROSTAT) 0.4 MG SL tablet Place 1 tablet (0.4 mg total) under the tongue every 5 (five) minutes as needed for chest pain. 11/15/18 11/15/19  End, Harrell Gave, MD  pantoprazole (PROTONIX) 40 MG tablet TAKE 1 TABLET BY MOUTH DAILY. Patient taking differently: Take 40 mg by mouth daily. 10/07/19   Lesleigh Noe, MD  rosuvastatin (CRESTOR) 20 MG tablet TAKE 1 TABLET ONCE DAILY. Patient taking differently: Take 20 mg by mouth daily. 04/07/20   Lesleigh Noe, MD  tamsulosin (FLOMAX) 0.4 MG CAPS capsule TAKE 1 CAPSULE 30 MINUTES AFTER THE SAME MEAL EACH DAY ONCE DAY. Patient taking differently: Take 0.4 mg by mouth daily. 04/07/20   Lesleigh Noe, MD    Allergies    Bee venom and Penicillins  Review of Systems   Review of Systems  Constitutional: Negative for chills, diaphoresis, fatigue and fever.  HENT: Negative for congestion.   Respiratory: Negative for cough, chest tightness, shortness of breath and wheezing.   Cardiovascular: Positive for chest pain. Negative for palpitations, syncope and  near-syncope.  Gastrointestinal: Positive for abdominal pain. Negative for constipation, diarrhea, nausea and vomiting.  Genitourinary: Negative for flank pain.  Musculoskeletal: Positive for back pain. Negative for neck pain and neck stiffness.  Neurological: Negative for weakness, light-headedness, numbness and headaches.  Psychiatric/Behavioral: Negative for agitation.  All other systems reviewed and are negative.   Physical Exam Updated Vital Signs BP (!) 168/110 (BP Location: Right Arm)   Pulse 62   Temp 97.7 F (36.5 C) (Oral)   Resp 14   SpO2 97%   Physical Exam Vitals and nursing note reviewed.  Constitutional:      General: He is not in acute distress.    Appearance: He is well-developed. He is not ill-appearing, toxic-appearing or diaphoretic.  HENT:     Head: Normocephalic and  atraumatic.  Eyes:     Extraocular Movements: Extraocular movements intact.     Conjunctiva/sclera: Conjunctivae normal.     Pupils: Pupils are equal, round, and reactive to light.  Cardiovascular:     Rate and Rhythm: Normal rate and regular rhythm.     Heart sounds: No murmur heard.   Pulmonary:     Effort: Pulmonary effort is normal. No respiratory distress.     Breath sounds: Normal breath sounds. No wheezing, rhonchi or rales.  Chest:     Chest wall: No tenderness.  Abdominal:     General: Abdomen is flat.     Palpations: Abdomen is soft.     Tenderness: There is no abdominal tenderness. There is no right CVA tenderness, left CVA tenderness, guarding or rebound.  Musculoskeletal:        General: No tenderness.     Cervical back: Neck supple.     Right lower leg: No edema.     Left lower leg: No edema.  Skin:    General: Skin is warm and dry.     Capillary Refill: Capillary refill takes less than 2 seconds.     Findings: No erythema.  Neurological:     General: No focal deficit present.     Mental Status: He is alert.  Psychiatric:        Mood and Affect: Mood normal.      ED Results / Procedures / Treatments   Labs (all labs ordered are listed, but only abnormal results are displayed) Labs Reviewed  CBC WITH DIFFERENTIAL/PLATELET - Abnormal; Notable for the following components:      Result Value   RBC 4.15 (*)    HCT 38.1 (*)    All other components within normal limits  COMPREHENSIVE METABOLIC PANEL - Abnormal; Notable for the following components:   Glucose, Bld 108 (*)    Creatinine, Ser 1.68 (*)    GFR, Estimated 42 (*)    Anion gap 4 (*)    All other components within normal limits  RESP PANEL BY RT-PCR (FLU A&B, COVID) [id number]  LACTIC ACID, PLASMA  LACTIC ACID, PLASMA  TROPONIN I (HIGH SENSITIVITY)  TROPONIN I (HIGH SENSITIVITY)    EKG EKG Interpretation  Date/Time:  Monday May 04 2020 18:22:04 EDT Ventricular Rate:  76 PR Interval:    QRS Duration: 91 QT Interval:  374 QTC Calculation: 421 R Axis:   -11 Text Interpretation: sinus rhythm with artifact Ventricular premature complex Abnormal R-wave progression, early transition When cmpared to prior, more artifact. No STEMI Confirmed by Antony Blackbird [phone]) on 05/04/2020 6:30:30 PM   Radiology DG Chest Portable 1 View  Result Date: 05/04/2020 CLINICAL DATA:  Chest and abdominal pain. EXAM: PORTABLE CHEST 1 VIEW COMPARISON:  Remote chest radiograph 12/07/03 FINDINGS: The cardiomediastinal contours are normal. Aortic atherosclerosis. Patchy bibasilar opacities favor atelectasis. Pulmonary vasculature is normal. No confluent consolidation, pleural effusion, or pneumothorax. No acute osseous abnormalities are seen. IMPRESSION: Patchy bibasilar opacities favor atelectasis. Aortic Atherosclerosis (ICD10-I70.0). Electronically Signed   By: Keith Rake M.D.   On: 05/04/2020 19:31   CT Angio Chest/Abd/Pel for Dissection W and/or Wo Contrast  Result Date: 05/04/2020 CLINICAL DATA:  Chest pain EXAM: CT ANGIOGRAPHY CHEST, ABDOMEN AND PELVIS TECHNIQUE: Non-contrast CT of the chest was  initially obtained. Multidetector CT imaging through the chest, abdomen and pelvis was performed using the standard protocol during bolus administration of intravenous contrast. Multiplanar reconstructed images and MIPs were obtained and reviewed to evaluate the vascular  anatomy. CONTRAST:  13mL OMNIPAQUE IOHEXOL 350 MG/ML SOLN COMPARISON:  Chest x-ray from earlier in the same day. FINDINGS: CTA CHEST FINDINGS Cardiovascular: Initial precontrast images demonstrate atherosclerotic calcifications of the thoracic aorta. No hyperdense crescent to suggest acute injury is seen. Following contrast administration the thoracic aorta and its branches are well visualize without aneurysmal dilatation or dissection. No cardiac enlargement is noted. Coronary calcifications are seen. Pulmonary artery as visualized appears within normal limits. Mediastinum/Nodes: Thoracic inlet is within normal limits. No sizable hilar or mediastinal adenopathy is noted. The esophagus as visualized is within normal limits. Lungs/Pleura: Lungs are well aerated bilaterally. No focal infiltrate or sizable effusion is seen. Mild emphysematous changes are noted. Musculoskeletal: No chest wall abnormality. No acute or significant osseous findings. Review of the MIP images confirms the above findings. CTA ABDOMEN AND PELVIS FINDINGS VASCULAR Aorta: Abdominal aorta demonstrates atherosclerotic calcifications. No aneurysmal dilatation or dissection is seen. Celiac: Mild atherosclerotic calcifications are noted. No focal stenosis is seen. SMA: Atherosclerotic calcifications are noted with approximately 50% stenosis secondary to eccentric soft atherosclerotic plaque. Renals: Single renal arteries are identified bilaterally. Mild stenosis is noted in the proximal right renal artery. IMA: Patent without evidence of aneurysm, dissection, vasculitis or significant stenosis. Iliacs: No aneurysmal dilatation or dissection is seen. Veins: No specific venous  abnormality is noted although not timed for venous evaluation. Review of the MIP images confirms the above findings. NON-VASCULAR Hepatobiliary: Scattered small hypodensities are noted within the liver likely representing cysts. The gallbladder is decompressed. Pancreas: Unremarkable. No pancreatic ductal dilatation or surrounding inflammatory changes. Spleen: Normal in size without focal abnormality. Adrenals/Urinary Tract: Adrenal glands are within normal limits. Kidneys demonstrate hypodensities bilaterally likely representing cysts. No renal calculi or obstructive changes are seen. The bladder is well distended. No ureteral abnormality is seen Stomach/Bowel: Diverticular changes noted throughout the colon. Appendix is not visualized consistent with a prior surgical history. Small bowel and stomach appear within normal limits. Lymphatic: No significant lymphadenopathy is identified. Reproductive: Prostate is unremarkable. Other: No abdominal wall hernia or abnormality. No abdominopelvic ascites. Musculoskeletal: No acute or significant osseous findings. Review of the MIP images confirms the above findings. IMPRESSION: CTA of the chest: No aortic injury is identified. No dissection is seen. No definitive pulmonary emboli. No other focal abnormality is seen. Aortic Atherosclerosis (ICD10-I70.0) and Emphysema (ICD10-J43.9). CTA of the abdomen and pelvis: Scattered hypodensities within the liver and kidney consistent with cysts. Diverticulosis without diverticulitis. No aortic abnormality is noted. Mild narrowing of the right renal artery and SMA is seen. Electronically Signed   By: [name] Catalina M.D.   On: 05/04/2020 19:56    Procedures Procedures   Medications Ordered in ED Medications  nitroGLYCERIN (NITROSTAT) SL tablet 0.4 mg (0.4 mg Sublingual Given 05/04/20 1904)  iohexol (OMNIPAQUE) 350 MG/ML injection 60 mL (60 mLs Intravenous Contrast Given 05/04/20 1939)    ED Course  I have reviewed the triage  vital signs and the nursing notes.  Pertinent labs & imaging results that were available during my care of the patient were reviewed by me and considered in my medical decision making (see chart for details).    MDM Rules/Calculators/A&P                          John Salazar is a 75 y.o. male with a past medical history significant for hypertension, GERD, CKD, anxiety, hypercholesterolemia, carotid disease status post right ICA stenting, known CAD currently getting medically  managed, and verbal report for intracranial aneurysm who presents with chest pain.  Patient reports that today, he started having chest discomfort in his central chest Transport in the department.  He reports no symptomatology and troponin was normal discomfort and ache.  He reports it may feel similar to when he had cardiac chest pain in the past.  He denies significant nausea, vomiting, constipation, diarrhea, or urinary changes.  He had 2 bowel movements today and is passing gas well.  He denies any history of aortic disease but he does agree that he sets of vascular troubles including carotid disease, coronary disease, and he says he has a small aneurysm in his brain that is scheduled to get managed next month.  He denies any new headache or neurologic deficits.  Denies any neck pain or neck stiffness.  He reports no trauma.  He says that today several hours ago, he started having discomfort in his central chest after lunch.  He reports he  did not eat any spicy or foods that typically aggravates his reflux.  He thinks his pain feels like his cardiac pain.  He reports he does go straight to his back and his blood pressure has been more elevated over the last few weeks.  His blood pressure is in the 170s on arrival.  He denies any leg pain or leg swelling.  Denies any other complaints.  He does think that exertion made his discomfort worse.  On exam, lungs are clear and chest is nontender, I cannot reproduce his discomfort.  No  murmur.  Abdomen is nontender.  Bowel sounds appreciated.  Intact DP pulses in both legs.  Normal sensation and strength in legs.  Back nontender.  Good pulses in upper extremities.  No focal neurologic deficits.  Clinically I do feel that we need to rule out an aortic cause of the disease given his vascular troubles, this report of intracranial aneurysm, and the pain going straight to his back with elevated blood pressures.  We will get CTA chest/abdomen/pelvis and other cardiac work-up started.  We will give him another nitroglycerin as he thinks it may have helped with EMS.  Anticipate reassessment after work-up to determine disposition.  Patient CT scan does not show any evidence of dissection.  Some diverticulosis was seen but no diverticulitis.  We went through all the findings.  COVID and flu test negative.  Troponin negative x2.  On reassessment, patient thinks it is gas pains.  He would like to go home and call his PCP about blood pressure management and cardiologist about the chest pain.  We offered to call cardiology but given the multiple negative troponins, he would like to go home.  Patient understands return precautions and follow-up instructions and was discharged in good condition with improved symptoms.   Final Clinical Impression(s) / ED Diagnoses Final diagnoses:  Elevated blood pressure reading  Chest pain, unspecified type    Rx / DC Orders ED Discharge Orders    None      Clinical Impression: 1. Elevated blood pressure reading   2. Chest pain, unspecified type     Disposition: Discharge  Condition: Good  I have discussed the results, Dx and Tx plan with the pt(& family if present). He/she/they expressed understanding and agree(s) with the plan. Discharge instructions discussed at great length. Strict return precautions discussed and pt &/or family have verbalized understanding of the instructions. No further questions at time of discharge.    Discharge  Medication List as of 05/04/2020  11:02 PM      Follow Up: Lesleigh Noe, MD [place] [address] [phone]     your cardiologist     [facility] [address] [id number] mc [place]       Nuala Chiles, Gwenyth Allegra, MD 05/04/20 2350

## 2020-05-05 ENCOUNTER — Encounter: Payer: Self-pay | Admitting: Family Medicine

## 2020-05-05 HISTORY — PX: IR RADIOLOGIST EVAL & MGMT: IMG5224

## 2020-05-05 NOTE — Telephone Encounter (Signed)
Pt went to ED w chest pains yesterday.  BP was elevated.  Pt on IMDUR 60 mg and losartan 100 mg daily.  Will route to Dr. Harrington Challenger for recommendations.

## 2020-05-05 NOTE — Telephone Encounter (Signed)
Scheduled pt for follow up with Dr. Harrington Challenger 05/11/20. 10 :00 am.  Pt aware and in agreement.

## 2020-05-07 ENCOUNTER — Ambulatory Visit: Payer: PPO | Admitting: Orthopedic Surgery

## 2020-05-08 ENCOUNTER — Other Ambulatory Visit (HOSPITAL_COMMUNITY): Payer: Self-pay | Admitting: Interventional Radiology

## 2020-05-08 DIAGNOSIS — I671 Cerebral aneurysm, nonruptured: Secondary | ICD-10-CM

## 2020-05-11 ENCOUNTER — Ambulatory Visit: Payer: PPO | Admitting: Internal Medicine

## 2020-05-11 ENCOUNTER — Encounter: Payer: Self-pay | Admitting: Internal Medicine

## 2020-05-11 ENCOUNTER — Other Ambulatory Visit: Payer: Self-pay

## 2020-05-11 ENCOUNTER — Encounter: Payer: PPO | Admitting: Rehabilitative and Restorative Service Providers"

## 2020-05-11 VITALS — BP 160/90 | HR 72 | Ht 69.0 in | Wt 172.0 lb

## 2020-05-11 DIAGNOSIS — I2510 Atherosclerotic heart disease of native coronary artery without angina pectoris: Secondary | ICD-10-CM | POA: Diagnosis not present

## 2020-05-11 MED ORDER — AMLODIPINE BESYLATE 2.5 MG PO TABS: 1.2500 mg | ORAL_TABLET | Freq: Every day | ORAL | 3 refills | Status: DC

## 2020-05-11 MED ORDER — ROSUVASTATIN CALCIUM 40 MG PO TABS: 40.0000 mg | ORAL_TABLET | Freq: Every day | ORAL | 3 refills | Status: DC

## 2020-05-11 NOTE — Progress Notes (Signed)
Cardiology Office Note   Date:  05/11/2020   ID:  John, Salazar 31-Mar-1945, MRN [id number]  PCP:  Lesleigh Noe, MD  Cardiologist:   Dorris Carnes, MD   Pt presents for f/u of HTN      History of Present Illness: John Salazar is a 75 y.o. male with a history of HTN, GERD, EtOH abuse and CV dz (s/p PTA/ stent 2019)    The pt was seen by [facility] in 2020   Underwent L heart cath which is noted below  Cath showed mod dz and one tight OM lesion.  Plan was to continue medical Rx   I saw him in clnic in Dec 2021  On 3/28 the pt says he had epigastric pain   Was at home   Lasted awhile  Took Gas X   Took a shower  Took 1 NTG  No relief   Called 911   Taken to [facility] ED   Given a couple more NTG on way with no relief  Overall pain/discomfort lasted a few hours     Says pressure   NO radiation   Not pleuritic   Pt r/o for MI  BP was noted to be elevated    Says his BP has been running high at home   Since ED visit he has not had any further CP  The pt is alos followed by Dr Estanislado Pandy.   He is sched for an embolization of a small aneurysm on 05/18/20  The pt says his breathing is OK         Current Meds  Medication Sig  . acetaminophen (TYLENOL) 325 MG tablet Take 650 mg by mouth every 6 (six) hours as needed (pain).  Marland Kitchen aspirin EC 81 MG tablet Take 81 mg by mouth in the morning.  . clopidogrel (PLAVIX) 75 MG tablet Take 1 tablet (75 mg total) by mouth daily. (Patient taking differently: Take 75 mg by mouth in the morning.)  . escitalopram (LEXAPRO) 10 MG tablet TAKE 1 TABLET ONCE DAILY. (Patient taking differently: Take 10 mg by mouth at bedtime.)  . isosorbide mononitrate (IMDUR) 60 MG 24 hr tablet TAKE 1 TABLET BY MOUTH DAILY. (Patient taking differently: Take 60 mg by mouth in the morning.)  . losartan (COZAAR) 100 MG tablet TAKE 1 TABLET ONCE DAILY. (Patient taking differently: Take 100 mg by mouth daily.)  . nitroGLYCERIN (NITROSTAT) 0.4 MG SL tablet Place 1 tablet (0.4 mg total)  under the tongue every 5 (five) minutes as needed for chest pain.  . pantoprazole (PROTONIX) 40 MG tablet TAKE 1 TABLET BY MOUTH DAILY. (Patient taking differently: Take 40 mg by mouth in the morning.)  . rosuvastatin (CRESTOR) 20 MG tablet TAKE 1 TABLET ONCE DAILY. (Patient taking differently: Take 20 mg by mouth every evening.)  . tamsulosin (FLOMAX) 0.4 MG CAPS capsule TAKE 1 CAPSULE 30 MINUTES AFTER THE SAME MEAL EACH DAY ONCE DAY. (Patient taking differently: Take 0.4 mg by mouth at bedtime.)     Allergies:   Bee venom and Penicillins   Past Medical History:  Diagnosis Date  . Anxiety   . BPH (benign prostatic hyperplasia)   . Chronic kidney disease    stage 2  . Colon polyps   . Erectile dysfunction   . GERD (gastroesophageal reflux disease)   . History of stomach ulcers   . Hypercholesteremia   . Hypertension   . Positive TB test    in the past  Past Surgical History:  Procedure Laterality Date  . APPENDECTOMY  1970  . CATARACT EXTRACTION W/ INTRAOCULAR LENS  IMPLANT, BILATERAL    . COLONOSCOPY  10/2012  . ESOPHAGOGASTRODUODENOSCOPY ENDOSCOPY  10/2012  . HERNIA REPAIR    . IR 3D INDEPENDENT WKST  04/14/2020  . IR ANGIO INTRA EXTRACRAN SEL COM CAROTID INNOMINATE BILAT MOD SED  08/09/2017  . IR ANGIO INTRA EXTRACRAN SEL COM CAROTID INNOMINATE BILAT MOD SED  11/08/2018  . IR ANGIO INTRA EXTRACRAN SEL COM CAROTID INNOMINATE BILAT MOD SED  04/14/2020  . IR ANGIO VERTEBRAL SEL VERTEBRAL BILAT MOD SED  08/09/2017  . IR ANGIO VERTEBRAL SEL VERTEBRAL BILAT MOD SED  11/08/2018  . IR ANGIO VERTEBRAL SEL VERTEBRAL BILAT MOD SED  04/14/2020  . IR INTRAVSC STENT CERV CAROTID W/EMB-PROT MOD SED INCL ANGIO  08/23/2017  . IR RADIOLOGIST EVAL & MGMT  05/05/2020  . IR US GUIDE VASC ACCESS RIGHT  04/14/2020  . LEFT HEART CATH AND CORONARY ANGIOGRAPHY N/A 11/15/2018   Procedure: LEFT HEART CATH AND CORONARY ANGIOGRAPHY;  Surgeon: Nelva Bush, MD;  Location: [facility];  Service:  Cardiovascular;  Laterality: N/A;  . PROSTATE SURGERY  1999   20 years ago, enlarged prostate  . RADIOLOGY WITH ANESTHESIA N/A 08/23/2017   Procedure: IR WITH ANESTHESIA STENT PLACEMENT;  Surgeon: Luanne Bras, MD;  Location: [facility];  Service: Radiology;  Laterality: N/A;     Social History:  The patient  reports that he quit smoking about 24 years ago. His smoking use included cigarettes. He has a 50.00 pack-year smoking history. He has never used smokeless tobacco. He reports that he does not drink alcohol and does not use drugs.   Family History:  The patient's family history includes Aneurysm (age of onset: 2) in his father; Stroke (age of onset: 36) in his mother.    ROS:  Please see the history of present illness. All other systems are reviewed and  Negative to the above problem except as noted.    PHYSICAL EXAM: VS:  BP (!) 160/90   Pulse 72   Ht 5\' 9"  (1.753 m)   Wt 172 lb (78 kg)   SpO2 97%   BMI 25.40 kg/m   GEN: Well nourished, well developed, in no acute distress  HEENT: normal  Neck: no JVD,  Cardiac: RRR; No murmur  No LE edema  Respiratory:  clear to auscultation bilaterally, GI: soft, nontender, nondistended, + BS  No hepatomegaly  MS: no deformity Moving all extremities   Skin: warm and dry, no rash Neuro:  Strength and sensation are intact Psych: euthymic mood, full affect   EKG:  EKG is not ordered today Lipid Panel    Component Value Date/Time   CHOL 160 05/17/2019 0729   TRIG 85.0 05/17/2019 0729   HDL 65.80 05/17/2019 0729   CHOLHDL 2 05/17/2019 0729   VLDL 17.0 05/17/2019 0729   LDLCALC 77 05/17/2019 0729    CATH:  11/15/18  Left Main  Vessel is large.  Dist LM lesion is 20% stenosed.  Left Anterior Descending  Vessel is moderate in size.  Ost LAD to Prox LAD lesion is 35% stenosed. The lesion is moderately calcified.  Prox LAD to Mid LAD lesion is 45% stenosed. The lesion is eccentric.  First Diagonal Branch  Vessel is small in  size.  Left Circumflex  Vessel is moderate in size.  Ost Cx to Prox Cx lesion is 40% stenosed.  Mid Cx to [facility]  Cx lesion is 70% stenosed.  First Obtuse Marginal Branch  Vessel is moderate in size.  Lateral First Obtuse Marginal Branch  Vessel is small in size.  Lat 1st Mrg lesion is 80% stenosed.  Second Obtuse Marginal Branch  Vessel is small in size.  2nd Mrg lesion is 70% stenosed.  Third Obtuse Marginal Branch  Vessel is small in size.  Right Coronary Artery  Vessel is small. Anterior takeoff.  Prox RCA lesion is 40% stenosed.     Wt Readings from Last 3 Encounters:  05/11/20 172 lb (78 kg)  04/29/20 166 lb 8 oz (75.5 kg)  04/14/20 160 lb (72.6 kg)      ASSESSMENT AND PLAN:  1 CAD Recent episode of CP with ED visity   I am not sure what this was caused by   BP was up at time   I am not convinced it was angina  He has not had any since   I would follow for now  2  HTN  BP is elevated   I would add low dose amlodipine 1/2 of a 2.5 tyo start   Nothing abrupt  Follow BP at home   I will see him in 1 month.     3  CV dz  Keep on AsA and Plavix   He is due to have angiogram/embolization next week  4  Lipids   LIpids  In Dec LDL 78   WOuld increase Crestor to 40 mg   F?U lipids in 2 months   Current medicines are reviewed at length with the patient today.  The patient does not have concerns regarding medicines.  Signed, Dorris Carnes, MD  05/11/2020 10:19 AM    [facility] [place], [place], [place]  [address] Phone: [phone]; Fax: [phone]

## 2020-05-11 NOTE — Patient Instructions (Addendum)
Medication Instructions:  Your physician has recommended you make the following change in your medication:  START Amlodpine 2.5mg - Take one-half tablet daily.  INCREASE Crestor to 40mg  daily  *If you need a refill on your cardiac medications before your next appointment, please call your pharmacy*   Lab Work: none  Testing/Procedures: none   Follow-Up: At [facility], you and your health needs are our priority.  As part of our continuing mission to provide you with exceptional heart care, we have created designated Provider Care Teams.  These Care Teams include your primary Cardiologist (physician) and Advanced Practice Providers (APPs -  Physician Assistants and Nurse Practitioners) who all work together to provide you with the care you need, when you need it.   Your next appointment:   1 month(s)  The format for your next appointment:   In Person  Provider:   Dorris Carnes, MD

## 2020-05-12 ENCOUNTER — Encounter: Payer: PPO | Admitting: Physical Therapy

## 2020-05-15 ENCOUNTER — Encounter: Payer: PPO | Admitting: Rehabilitative and Restorative Service Providers"

## 2020-05-15 ENCOUNTER — Encounter (HOSPITAL_COMMUNITY): Payer: Self-pay | Admitting: Interventional Radiology

## 2020-05-15 ENCOUNTER — Other Ambulatory Visit (HOSPITAL_COMMUNITY)
Admission: RE | Admit: 2020-05-15 | Discharge: 2020-05-15 | Disposition: A | Discharge status: Home / Self Care | Payer: PPO | Source: Ambulatory Visit | Attending: Interventional Radiology | Admitting: Interventional Radiology

## 2020-05-15 ENCOUNTER — Other Ambulatory Visit (HOSPITAL_COMMUNITY): Payer: Self-pay | Admitting: Radiology

## 2020-05-15 ENCOUNTER — Other Ambulatory Visit: Payer: Self-pay | Admitting: Student

## 2020-05-15 DIAGNOSIS — I671 Cerebral aneurysm, nonruptured: Secondary | ICD-10-CM | POA: Diagnosis present

## 2020-05-15 DIAGNOSIS — Z9842 Cataract extraction status, left eye: Secondary | ICD-10-CM | POA: Diagnosis not present

## 2020-05-15 DIAGNOSIS — N183 Chronic kidney disease, stage 3 unspecified: Secondary | ICD-10-CM | POA: Diagnosis not present

## 2020-05-15 DIAGNOSIS — K219 Gastro-esophageal reflux disease without esophagitis: Secondary | ICD-10-CM | POA: Diagnosis present

## 2020-05-15 DIAGNOSIS — F418 Other specified anxiety disorders: Secondary | ICD-10-CM | POA: Diagnosis not present

## 2020-05-15 DIAGNOSIS — I672 Cerebral atherosclerosis: Secondary | ICD-10-CM | POA: Diagnosis present

## 2020-05-15 DIAGNOSIS — Z87891 Personal history of nicotine dependence: Secondary | ICD-10-CM | POA: Diagnosis not present

## 2020-05-15 DIAGNOSIS — Z01812 Encounter for preprocedural laboratory examination: Secondary | ICD-10-CM | POA: Insufficient documentation

## 2020-05-15 DIAGNOSIS — Z86010 Personal history of colonic polyps: Secondary | ICD-10-CM | POA: Diagnosis not present

## 2020-05-15 DIAGNOSIS — Z961 Presence of intraocular lens: Secondary | ICD-10-CM | POA: Diagnosis present

## 2020-05-15 DIAGNOSIS — Z8711 Personal history of peptic ulcer disease: Secondary | ICD-10-CM | POA: Diagnosis not present

## 2020-05-15 DIAGNOSIS — I2510 Atherosclerotic heart disease of native coronary artery without angina pectoris: Secondary | ICD-10-CM | POA: Diagnosis present

## 2020-05-15 DIAGNOSIS — Z20822 Contact with and (suspected) exposure to covid-19: Secondary | ICD-10-CM | POA: Diagnosis present

## 2020-05-15 DIAGNOSIS — F419 Anxiety disorder, unspecified: Secondary | ICD-10-CM | POA: Diagnosis present

## 2020-05-15 DIAGNOSIS — I6521 Occlusion and stenosis of right carotid artery: Secondary | ICD-10-CM

## 2020-05-15 DIAGNOSIS — Z7902 Long term (current) use of antithrombotics/antiplatelets: Secondary | ICD-10-CM | POA: Diagnosis not present

## 2020-05-15 DIAGNOSIS — Z823 Family history of stroke: Secondary | ICD-10-CM | POA: Diagnosis not present

## 2020-05-15 DIAGNOSIS — Z880 Allergy status to penicillin: Secondary | ICD-10-CM | POA: Diagnosis not present

## 2020-05-15 DIAGNOSIS — F32A Depression, unspecified: Secondary | ICD-10-CM | POA: Diagnosis present

## 2020-05-15 DIAGNOSIS — Z87442 Personal history of urinary calculi: Secondary | ICD-10-CM | POA: Diagnosis not present

## 2020-05-15 DIAGNOSIS — I6522 Occlusion and stenosis of left carotid artery: Secondary | ICD-10-CM | POA: Diagnosis not present

## 2020-05-15 DIAGNOSIS — Z7982 Long term (current) use of aspirin: Secondary | ICD-10-CM | POA: Diagnosis not present

## 2020-05-15 DIAGNOSIS — I129 Hypertensive chronic kidney disease with stage 1 through stage 4 chronic kidney disease, or unspecified chronic kidney disease: Secondary | ICD-10-CM | POA: Diagnosis present

## 2020-05-15 DIAGNOSIS — I63232 Cerebral infarction due to unspecified occlusion or stenosis of left carotid arteries: Secondary | ICD-10-CM | POA: Diagnosis not present

## 2020-05-15 DIAGNOSIS — N182 Chronic kidney disease, stage 2 (mild): Secondary | ICD-10-CM | POA: Diagnosis present

## 2020-05-15 DIAGNOSIS — Z91030 Bee allergy status: Secondary | ICD-10-CM | POA: Diagnosis not present

## 2020-05-15 DIAGNOSIS — Z79899 Other long term (current) drug therapy: Secondary | ICD-10-CM | POA: Diagnosis not present

## 2020-05-15 DIAGNOSIS — Z9841 Cataract extraction status, right eye: Secondary | ICD-10-CM | POA: Diagnosis not present

## 2020-05-15 DIAGNOSIS — N400 Enlarged prostate without lower urinary tract symptoms: Secondary | ICD-10-CM | POA: Diagnosis present

## 2020-05-15 LAB — PLATELET INHIBITION P2Y12: Platelet Function  P2Y12: 149 [PRU] — ABNORMAL LOW (ref 182–335)

## 2020-05-15 NOTE — Progress Notes (Signed)
Spoke with pt for pre-op call. Pt sees Dr. Harrington Challenger for non-obstructive CAD. Denies any recent chest pain or shortness of breath. Pt states he is not diabetic.   Covid test to be done today. Instructed pt to quarantine once the test is done and to stay in quarantine until he comes to the hospital on Monday. He voiced understanding.

## 2020-05-16 LAB — SARS CORONAVIRUS 2 (TAT 6-24 HRS): SARS Coronavirus 2: NEGATIVE

## 2020-05-18 ENCOUNTER — Other Ambulatory Visit: Payer: Self-pay | Admitting: Radiology

## 2020-05-18 ENCOUNTER — Encounter (HOSPITAL_COMMUNITY): Payer: Self-pay

## 2020-05-18 ENCOUNTER — Other Ambulatory Visit: Payer: Self-pay

## 2020-05-18 ENCOUNTER — Ambulatory Visit (HOSPITAL_COMMUNITY): Payer: PPO | Admitting: Certified Registered Nurse Anesthetist

## 2020-05-18 ENCOUNTER — Encounter (HOSPITAL_COMMUNITY)
Admission: RE | Disposition: A | Discharge status: Home / Self Care | Payer: Self-pay | Source: Home / Self Care | Attending: Interventional Radiology

## 2020-05-18 ENCOUNTER — Ambulatory Visit (HOSPITAL_COMMUNITY)
Admission: RE | Admit: 2020-05-18 | Discharge: 2020-05-18 | Disposition: A | Discharge status: Home / Self Care | Payer: PPO | Source: Ambulatory Visit | Attending: Interventional Radiology | Admitting: Interventional Radiology

## 2020-05-18 ENCOUNTER — Encounter (HOSPITAL_COMMUNITY): Payer: Self-pay | Admitting: Interventional Radiology

## 2020-05-18 ENCOUNTER — Inpatient Hospital Stay (HOSPITAL_COMMUNITY)
Admission: RE | Admit: 2020-05-18 | Discharge: 2020-05-19 | DRG: 027 | Disposition: A | Discharge status: Home / Self Care | Payer: PPO | Attending: Interventional Radiology | Admitting: Interventional Radiology

## 2020-05-18 DIAGNOSIS — Z7902 Long term (current) use of antithrombotics/antiplatelets: Secondary | ICD-10-CM | POA: Insufficient documentation

## 2020-05-18 DIAGNOSIS — Z79899 Other long term (current) drug therapy: Secondary | ICD-10-CM | POA: Diagnosis not present

## 2020-05-18 DIAGNOSIS — N182 Chronic kidney disease, stage 2 (mild): Secondary | ICD-10-CM | POA: Diagnosis present

## 2020-05-18 DIAGNOSIS — Z8669 Personal history of other diseases of the nervous system and sense organs: Secondary | ICD-10-CM | POA: Insufficient documentation

## 2020-05-18 DIAGNOSIS — Z91030 Bee allergy status: Secondary | ICD-10-CM | POA: Diagnosis not present

## 2020-05-18 DIAGNOSIS — Z87891 Personal history of nicotine dependence: Secondary | ICD-10-CM | POA: Insufficient documentation

## 2020-05-18 DIAGNOSIS — Z86010 Personal history of colonic polyps: Secondary | ICD-10-CM

## 2020-05-18 DIAGNOSIS — F32A Depression, unspecified: Secondary | ICD-10-CM | POA: Diagnosis present

## 2020-05-18 DIAGNOSIS — I129 Hypertensive chronic kidney disease with stage 1 through stage 4 chronic kidney disease, or unspecified chronic kidney disease: Secondary | ICD-10-CM | POA: Diagnosis present

## 2020-05-18 DIAGNOSIS — F419 Anxiety disorder, unspecified: Secondary | ICD-10-CM | POA: Diagnosis present

## 2020-05-18 DIAGNOSIS — Z87442 Personal history of urinary calculi: Secondary | ICD-10-CM

## 2020-05-18 DIAGNOSIS — Z9841 Cataract extraction status, right eye: Secondary | ICD-10-CM | POA: Diagnosis not present

## 2020-05-18 DIAGNOSIS — I2510 Atherosclerotic heart disease of native coronary artery without angina pectoris: Secondary | ICD-10-CM | POA: Diagnosis present

## 2020-05-18 DIAGNOSIS — Z880 Allergy status to penicillin: Secondary | ICD-10-CM | POA: Diagnosis not present

## 2020-05-18 DIAGNOSIS — I671 Cerebral aneurysm, nonruptured: Principal | ICD-10-CM | POA: Diagnosis present

## 2020-05-18 DIAGNOSIS — Z7982 Long term (current) use of aspirin: Secondary | ICD-10-CM

## 2020-05-18 DIAGNOSIS — Z961 Presence of intraocular lens: Secondary | ICD-10-CM | POA: Diagnosis present

## 2020-05-18 DIAGNOSIS — N400 Enlarged prostate without lower urinary tract symptoms: Secondary | ICD-10-CM | POA: Diagnosis present

## 2020-05-18 DIAGNOSIS — N183 Chronic kidney disease, stage 3 unspecified: Secondary | ICD-10-CM | POA: Diagnosis not present

## 2020-05-18 DIAGNOSIS — Z800 Family history of malignant neoplasm of digestive organs: Secondary | ICD-10-CM | POA: Insufficient documentation

## 2020-05-18 DIAGNOSIS — Z823 Family history of stroke: Secondary | ICD-10-CM | POA: Diagnosis not present

## 2020-05-18 DIAGNOSIS — R519 Headache, unspecified: Secondary | ICD-10-CM | POA: Insufficient documentation

## 2020-05-18 DIAGNOSIS — E7800 Pure hypercholesterolemia, unspecified: Secondary | ICD-10-CM | POA: Insufficient documentation

## 2020-05-18 DIAGNOSIS — I672 Cerebral atherosclerosis: Secondary | ICD-10-CM | POA: Diagnosis present

## 2020-05-18 DIAGNOSIS — Z20822 Contact with and (suspected) exposure to covid-19: Secondary | ICD-10-CM | POA: Diagnosis present

## 2020-05-18 DIAGNOSIS — Z9842 Cataract extraction status, left eye: Secondary | ICD-10-CM

## 2020-05-18 DIAGNOSIS — F418 Other specified anxiety disorders: Secondary | ICD-10-CM | POA: Diagnosis not present

## 2020-05-18 DIAGNOSIS — Z8711 Personal history of peptic ulcer disease: Secondary | ICD-10-CM

## 2020-05-18 DIAGNOSIS — K219 Gastro-esophageal reflux disease without esophagitis: Secondary | ICD-10-CM | POA: Diagnosis present

## 2020-05-18 DIAGNOSIS — I63232 Cerebral infarction due to unspecified occlusion or stenosis of left carotid arteries: Secondary | ICD-10-CM | POA: Diagnosis not present

## 2020-05-18 DIAGNOSIS — I6522 Occlusion and stenosis of left carotid artery: Secondary | ICD-10-CM | POA: Diagnosis not present

## 2020-05-18 HISTORY — PX: IR TRANSCATH/EMBOLIZ: IMG695

## 2020-05-18 HISTORY — PX: RADIOLOGY WITH ANESTHESIA: SHX6223

## 2020-05-18 HISTORY — DX: Cerebral aneurysm, nonruptured: I67.1

## 2020-05-18 HISTORY — DX: Atherosclerotic heart disease of native coronary artery without angina pectoris: I25.10

## 2020-05-18 HISTORY — DX: Personal history of urinary calculi: Z87.442

## 2020-05-18 LAB — CBC WITH DIFFERENTIAL/PLATELET
Abs Immature Granulocytes: 0.02 10*3/uL (ref 0.00–0.07)
Basophils Absolute: 0 10*3/uL (ref 0.0–0.1)
Basophils Relative: 1 %
Eosinophils Absolute: 0.1 10*3/uL (ref 0.0–0.5)
Eosinophils Relative: 3 %
HCT: 41.7 % (ref 39.0–52.0)
Hemoglobin: 14.2 g/dL (ref 13.0–17.0)
Immature Granulocytes: 0 %
Lymphocytes Relative: 18 %
Lymphs Abs: 1 10*3/uL (ref 0.7–4.0)
MCH: 31.3 pg (ref 26.0–34.0)
MCHC: 34.1 g/dL (ref 30.0–36.0)
MCV: 91.9 fL (ref 80.0–100.0)
Monocytes Absolute: 0.5 10*3/uL (ref 0.1–1.0)
Monocytes Relative: 10 %
Neutro Abs: 3.6 10*3/uL (ref 1.7–7.7)
Neutrophils Relative %: 68 %
Platelets: 167 10*3/uL (ref 150–400)
RBC: 4.54 MIL/uL (ref 4.22–5.81)
RDW: 12.9 % (ref 11.5–15.5)
WBC: 5.3 10*3/uL (ref 4.0–10.5)
nRBC: 0 % (ref 0.0–0.2)

## 2020-05-18 LAB — BASIC METABOLIC PANEL
Anion gap: 3 — ABNORMAL LOW (ref 5–15)
BUN: 18 mg/dL (ref 8–23)
CO2: 26 mmol/L (ref 22–32)
Calcium: 9.2 mg/dL (ref 8.9–10.3)
Chloride: 109 mmol/L (ref 98–111)
Creatinine, Ser: 1.71 mg/dL — ABNORMAL HIGH (ref 0.61–1.24)
GFR, Estimated: 41 mL/min — ABNORMAL LOW (ref >60)
Glucose, Bld: 101 mg/dL — ABNORMAL HIGH (ref 70–99)
Potassium: 4 mmol/L (ref 3.5–5.1)
Sodium: 138 mmol/L (ref 135–145)

## 2020-05-18 LAB — PLATELET INHIBITION P2Y12: Platelet Function  P2Y12: 130 [PRU] — ABNORMAL LOW (ref 182–335)

## 2020-05-18 LAB — PROTIME-INR
INR: 1.1 (ref 0.8–1.2)
Prothrombin Time: 13.9 seconds (ref 11.4–15.2)

## 2020-05-18 LAB — APTT: aPTT: 30 seconds (ref 24–36)

## 2020-05-18 LAB — POCT ACTIVATED CLOTTING TIME
Activated Clotting Time: 202 seconds
Activated Clotting Time: 148 seconds

## 2020-05-18 SURGERY — IR WITH ANESTHESIA
Anesthesia: General

## 2020-05-18 MED ORDER — HEPARIN (PORCINE) 25000 UT/250ML-% IV SOLN: 500.0000 [IU]/h | INTRAVENOUS | Status: DC

## 2020-05-18 MED ORDER — PHENYLEPHRINE HCL-NACL 10-0.9 MG/250ML-% IV SOLN
INTRAVENOUS | Status: DC | PRN
  Administered 2020-05-18: 50 ug/min via INTRAVENOUS

## 2020-05-18 MED ORDER — VANCOMYCIN HCL 1000 MG/200ML IV SOLN
1000.0000 mg | Freq: Once | INTRAVENOUS | Status: DC
  Filled 2020-05-18: qty 200

## 2020-05-18 MED ORDER — ACETAMINOPHEN 160 MG/5ML PO SOLN: 650.0000 mg | ORAL | Status: DC | PRN

## 2020-05-18 MED ORDER — LIDOCAINE HCL 1 % IJ SOLN
INTRAMUSCULAR | Status: AC
  Filled 2020-05-18: qty 20

## 2020-05-18 MED ORDER — SODIUM CHLORIDE 0.9 % IV SOLN: INTRAVENOUS | Status: DC

## 2020-05-18 MED ORDER — TAMSULOSIN HCL 0.4 MG PO CAPS
0.4000 mg | ORAL_CAPSULE | Freq: Every day | ORAL | Status: DC
  Administered 2020-05-18: 0.4 mg via ORAL
  Filled 2020-05-18: qty 1

## 2020-05-18 MED ORDER — IOHEXOL 300 MG/ML  SOLN
150.0000 mL | Freq: Once | INTRAMUSCULAR | Status: AC | PRN
  Administered 2020-05-18: 40 mL via INTRA_ARTERIAL

## 2020-05-18 MED ORDER — VERAPAMIL HCL 2.5 MG/ML IV SOLN: INTRA_ARTERIAL | Status: AC | PRN

## 2020-05-18 MED ORDER — SUGAMMADEX SODIUM 200 MG/2ML IV SOLN
INTRAVENOUS | Status: DC | PRN
  Administered 2020-05-18: 100 mg via INTRAVENOUS
  Administered 2020-05-18 (×2): 200 mg via INTRAVENOUS

## 2020-05-18 MED ORDER — ESMOLOL HCL 100 MG/10ML IV SOLN
INTRAVENOUS | Status: DC | PRN
  Administered 2020-05-18: 30 mg via INTRAVENOUS

## 2020-05-18 MED ORDER — EPTIFIBATIDE 20 MG/10ML IV SOLN
INTRAVENOUS | Status: AC
  Filled 2020-05-18: qty 10

## 2020-05-18 MED ORDER — HEPARIN SODIUM (PORCINE) 1000 UNIT/ML IJ SOLN
INTRAMUSCULAR | Status: DC | PRN
  Administered 2020-05-18 (×2): 1000 [IU] via INTRAVENOUS

## 2020-05-18 MED ORDER — GLYCOPYRROLATE 0.2 MG/ML IJ SOLN
INTRAMUSCULAR | Status: DC | PRN
  Administered 2020-05-18 (×2): .1 mg via INTRAVENOUS

## 2020-05-18 MED ORDER — PHENYLEPHRINE 40 MCG/ML (10ML) SYRINGE FOR IV PUSH (FOR BLOOD PRESSURE SUPPORT)
PREFILLED_SYRINGE | INTRAVENOUS | Status: DC | PRN
  Administered 2020-05-18 (×2): 80 ug via INTRAVENOUS

## 2020-05-18 MED ORDER — FENTANYL CITRATE (PF) 100 MCG/2ML IJ SOLN
INTRAMUSCULAR | Status: DC | PRN
  Administered 2020-05-18 (×2): 50 ug via INTRAVENOUS

## 2020-05-18 MED ORDER — DEXAMETHASONE SODIUM PHOSPHATE 10 MG/ML IJ SOLN
INTRAMUSCULAR | Status: DC | PRN
  Administered 2020-05-18: 8 mg via INTRAVENOUS

## 2020-05-18 MED ORDER — ASPIRIN 81 MG PO CHEW: 324.0000 mg | CHEWABLE_TABLET | Freq: Every day | ORAL | Status: DC

## 2020-05-18 MED ORDER — CLOPIDOGREL BISULFATE 75 MG PO TABS
ORAL_TABLET | ORAL | Status: AC
  Filled 2020-05-18: qty 1

## 2020-05-18 MED ORDER — SODIUM CHLORIDE 0.9 % IV SOLN: INTRAVENOUS | Status: DC | PRN

## 2020-05-18 MED ORDER — VERAPAMIL HCL 2.5 MG/ML IV SOLN
INTRAVENOUS | Status: AC
  Filled 2020-05-18: qty 2

## 2020-05-18 MED ORDER — HEPARIN SODIUM (PORCINE) 1000 UNIT/ML IJ SOLN
INTRAMUSCULAR | Status: AC
  Filled 2020-05-18: qty 1

## 2020-05-18 MED ORDER — CLEVIDIPINE BUTYRATE 0.5 MG/ML IV EMUL
0.0000 mg/h | INTRAVENOUS | Status: DC
  Administered 2020-05-18 (×2): 6 mg/h via INTRAVENOUS
  Administered 2020-05-19 (×2): 4 mg/h via INTRAVENOUS
  Filled 2020-05-18 (×4): qty 50

## 2020-05-18 MED ORDER — ONDANSETRON HCL 4 MG/2ML IJ SOLN
INTRAMUSCULAR | Status: DC | PRN
  Administered 2020-05-18: 4 mg via INTRAVENOUS

## 2020-05-18 MED ORDER — CHLORHEXIDINE GLUCONATE 0.12 % MT SOLN
15.0000 mL | Freq: Once | OROMUCOSAL | Status: AC
  Administered 2020-05-18: 15 mL via OROMUCOSAL
  Filled 2020-05-18: qty 15

## 2020-05-18 MED ORDER — HEPARIN SODIUM (PORCINE) 1000 UNIT/ML IJ SOLN
INTRAMUSCULAR | Status: AC | PRN
  Administered 2020-05-18: 2000 [IU] via INTRA_ARTERIAL

## 2020-05-18 MED ORDER — HEPARIN (PORCINE) 25000 UT/250ML-% IV SOLN
600.0000 [IU]/h | INTRAVENOUS | Status: DC
  Administered 2020-05-18: 600 [IU]/h via INTRAVENOUS
  Filled 2020-05-18: qty 250

## 2020-05-18 MED ORDER — ROCURONIUM BROMIDE 10 MG/ML (PF) SYRINGE
PREFILLED_SYRINGE | INTRAVENOUS | Status: DC | PRN
  Administered 2020-05-18 (×2): 20 mg via INTRAVENOUS
  Administered 2020-05-18: 50 mg via INTRAVENOUS

## 2020-05-18 MED ORDER — PROPOFOL 10 MG/ML IV BOLUS
INTRAVENOUS | Status: DC | PRN
  Administered 2020-05-18: 150 mg via INTRAVENOUS

## 2020-05-18 MED ORDER — HEPARIN (PORCINE) 25000 UT/250ML-% IV SOLN
600.0000 [IU]/h | INTRAVENOUS | Status: DC
  Filled 2020-05-18: qty 250

## 2020-05-18 MED ORDER — LIDOCAINE HCL 1 % IJ SOLN
INTRAMUSCULAR | Status: AC | PRN
  Administered 2020-05-18: 5 mL

## 2020-05-18 MED ORDER — VANCOMYCIN HCL 1000 MG IV SOLR
1000.0000 mg | INTRAVENOUS | Status: DC
  Filled 2020-05-18: qty 1000

## 2020-05-18 MED ORDER — CLOPIDOGREL BISULFATE 75 MG PO TABS
75.0000 mg | ORAL_TABLET | ORAL | Status: AC
  Administered 2020-05-18: 75 mg via ORAL
  Filled 2020-05-18: qty 1

## 2020-05-18 MED ORDER — ACETAMINOPHEN 325 MG PO TABS
650.0000 mg | ORAL_TABLET | ORAL | Status: DC | PRN
  Administered 2020-05-18 – 2020-05-19 (×3): 650 mg via ORAL
  Filled 2020-05-18 (×3): qty 2

## 2020-05-18 MED ORDER — NITROGLYCERIN 1 MG/10 ML FOR IR/CATH LAB
INTRA_ARTERIAL | Status: AC | PRN
  Administered 2020-05-18: 200 ug via INTRA_ARTERIAL

## 2020-05-18 MED ORDER — ACETAMINOPHEN 650 MG RE SUPP: 650.0000 mg | RECTAL | Status: DC | PRN

## 2020-05-18 MED ORDER — ASPIRIN 325 MG PO TABS
325.0000 mg | ORAL_TABLET | Freq: Every day | ORAL | Status: DC
  Administered 2020-05-19: 325 mg via ORAL
  Filled 2020-05-18: qty 1

## 2020-05-18 MED ORDER — ORAL CARE MOUTH RINSE: 15.0000 mL | Freq: Once | OROMUCOSAL | Status: AC

## 2020-05-18 MED ORDER — VANCOMYCIN HCL IN DEXTROSE 1-5 GM/200ML-% IV SOLN
INTRAVENOUS | Status: AC
  Administered 2020-05-18: 1000 mg
  Filled 2020-05-18: qty 200

## 2020-05-18 MED ORDER — EPHEDRINE SULFATE-NACL 50-0.9 MG/10ML-% IV SOSY
PREFILLED_SYRINGE | INTRAVENOUS | Status: DC | PRN
  Administered 2020-05-18 (×2): 5 mg via INTRAVENOUS

## 2020-05-18 MED ORDER — CHLORHEXIDINE GLUCONATE CLOTH 2 % EX PADS
6.0000 | MEDICATED_PAD | Freq: Every day | CUTANEOUS | Status: DC
  Administered 2020-05-18 – 2020-05-19 (×2): 6 via TOPICAL

## 2020-05-18 MED ORDER — CLOPIDOGREL BISULFATE 75 MG PO TABS
75.0000 mg | ORAL_TABLET | Freq: Every day | ORAL | Status: DC
  Administered 2020-05-19: 75 mg via ORAL
  Filled 2020-05-18: qty 1

## 2020-05-18 MED ORDER — NITROGLYCERIN 1 MG/10 ML FOR IR/CATH LAB
INTRA_ARTERIAL | Status: AC
  Filled 2020-05-18: qty 10

## 2020-05-18 MED ORDER — EPTIFIBATIDE 20 MG/10ML IV SOLN
INTRAVENOUS | Status: AC | PRN
  Administered 2020-05-18 (×2): 1.5 mg via INTRAVENOUS

## 2020-05-18 MED ORDER — LACTATED RINGERS IV SOLN: INTRAVENOUS | Status: DC

## 2020-05-18 MED ORDER — ASPIRIN EC 325 MG PO TBEC: 325.0000 mg | DELAYED_RELEASE_TABLET | ORAL | Status: DC

## 2020-05-18 MED ORDER — IOHEXOL 300 MG/ML  SOLN
150.0000 mL | Freq: Once | INTRAMUSCULAR | Status: AC | PRN
  Administered 2020-05-18: 80 mL via INTRA_ARTERIAL

## 2020-05-18 MED ORDER — LIDOCAINE 2% (20 MG/ML) 5 ML SYRINGE
INTRAMUSCULAR | Status: DC | PRN
  Administered 2020-05-18: 40 mg via INTRAVENOUS

## 2020-05-18 MED ORDER — CLOPIDOGREL BISULFATE 75 MG PO TABS: 75.0000 mg | ORAL_TABLET | Freq: Every day | ORAL | Status: DC

## 2020-05-18 MED ORDER — CLEVIDIPINE BUTYRATE 0.5 MG/ML IV EMUL
0.0000 mg/h | INTRAVENOUS | Status: DC
  Administered 2020-05-18: 1 mg/h via INTRAVENOUS

## 2020-05-18 MED ORDER — NIMODIPINE 30 MG PO CAPS: 0.0000 mg | ORAL_CAPSULE | ORAL | Status: DC

## 2020-05-18 NOTE — Procedures (Signed)
S/P Lt common carotid artrriogram RT rad approach. S/P embolization of wide neck Lt ICA supraclinoid  Aneurysm with  A 4.5 mm x14 mm Pipeline  Shield device. Extubated. More awake. Denies any H/As,N/V or visual symptoms. Pupils 12mm Rt = Lt sluggish. Moves all 4s equally. S.Mileah Hemmer MD

## 2020-05-18 NOTE — Transfer of Care (Signed)
Immediate Anesthesia Transfer of Care Note  Patient: John Salazar  Procedure(s) Performed: IR WITH ANESTHESIA EMBOLIZATION (N/A )  Patient Location: PACU  Anesthesia Type:General  Level of Consciousness: awake, alert  and oriented  Airway & Oxygen Therapy: Patient Spontanous Breathing and Patient connected to nasal cannula oxygen  Post-op Assessment: Report given to RN, Post -op Vital signs reviewed and stable, Patient moving all extremities X 4 and Patient able to stick tongue midline  Post vital signs: Reviewed and stable  Last Vitals:  Vitals Value Taken Time  BP 131/92 05/18/20 1146  Temp    Pulse 88 05/18/20 1152  Resp 12 05/18/20 1152  SpO2 94 % 05/18/20 1152  Vitals shown include unvalidated device data.  Last Pain:  Vitals:   05/18/20 0650  TempSrc:   PainSc: 0-No pain         Complications: No complications documented.

## 2020-05-18 NOTE — H&P (Addendum)
Chief Complaint: Patient was seen in consultation today for cerebral arteriogram with possible left internal carotid artery aneurysm embolization / possible revascularization of left internal carotid artery at the request of Dr Myrla Halsted   Supervising Physician: Luanne Bras  Patient Status: [facility] - Out-pt  History of Present Illness: John Salazar is a 75 y.o. male   Known to [place] Previous R ICA angioplasty/stent 08/2017  Doing well; denies symptoms or complaints Taking ASA 81 and Plavix 25 mg daily [id number] 130 this am  CTA 11/2018 revealed stable R ICA stent and patency With possible L ICA irregularity Angio 04/14/20: IMPRESSION: Approximately 50% stenosis of the left internal carotid artery proximally at the bulb associated with two aneurysmal outpouchings measuring 3.2 mm x 3.4 mm, and 5.2 mm x 3.7 mm slightly more prominent proximally. Delayed clearance of contrast from the left carotid bulb. 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 Approximately 50-75% stenosis of the distal right MCA M1 segment. Approximately 50% stenosis of the left vertebrobasilar junction distal to the origin of the left posterior-inferior cerebellar artery.  Scheduled today for L ICA aneurysm (s) embolization with possible revascularization in IR with Dr Estanislado Pandy    Past Medical History:  Diagnosis Date  . Anxiety   . BPH (benign prostatic hyperplasia)   . Cerebral aneurysm   . Chronic kidney disease    stage 2  . Colon polyps   . Coronary artery disease    mild-mod non-obstructive CAD  . Erectile dysfunction   . GERD (gastroesophageal reflux disease)   . History of kidney stones   . History of stomach ulcers   . Hypercholesteremia   . Hypertension   . 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 APPENDECTOMY  1970  . CATARACT EXTRACTION  W/ INTRAOCULAR LENS  IMPLANT, BILATERAL    . COLONOSCOPY  10/2012  . ESOPHAGOGASTRODUODENOSCOPY ENDOSCOPY  10/2012  . HERNIA REPAIR    . IR 3D INDEPENDENT WKST  04/14/2020  . IR ANGIO INTRA EXTRACRAN SEL COM CAROTID INNOMINATE BILAT MOD SED  08/09/2017  . IR ANGIO INTRA EXTRACRAN SEL COM CAROTID INNOMINATE BILAT MOD SED  11/08/2018  . IR ANGIO INTRA EXTRACRAN SEL COM CAROTID INNOMINATE BILAT MOD SED  04/14/2020  . IR ANGIO VERTEBRAL SEL VERTEBRAL BILAT MOD SED  08/09/2017  . IR ANGIO VERTEBRAL SEL VERTEBRAL BILAT MOD SED  11/08/2018  . IR ANGIO VERTEBRAL SEL VERTEBRAL BILAT MOD SED  04/14/2020  . IR INTRAVSC STENT CERV CAROTID W/EMB-PROT MOD SED INCL ANGIO  08/23/2017  . IR RADIOLOGIST EVAL & MGMT  05/05/2020  . IR US GUIDE VASC ACCESS RIGHT  04/14/2020  . LEFT HEART CATH AND CORONARY ANGIOGRAPHY N/A 11/15/2018   Procedure: LEFT HEART CATH AND CORONARY ANGIOGRAPHY;  Surgeon: Nelva Bush, MD;  Location: [place] [facility];  Service: Cardiovascular;  Laterality: N/A;  . PROSTATE SURGERY  1999   20 years ago, enlarged prostate  . RADIOLOGY WITH ANESTHESIA N/A 08/23/2017   Procedure: IR WITH ANESTHESIA STENT PLACEMENT;  Surgeon: Luanne Bras, MD;  Location: [place];  Service: Radiology;  Laterality: N/A;    Allergies: Bee venom and Penicillins  Medications: Prior to Admission medications   Medication Sig Start Date End Date Taking? Authorizing Provider  acetaminophen (TYLENOL) 325 MG tablet Take 650 mg by mouth every 6 (six) hours as needed (pain).  [provider]  amLODipine (NORVASC) 2.5 MG tablet Take 0.5 tablets (1.25 mg total) by mouth daily. Take one-half tablet daily. 05/11/20   Fay Records, MD  aspirin EC 81 MG tablet Take 81 mg by mouth in the morning.    [provider]  clopidogrel (PLAVIX) 75 MG tablet Take 1 tablet (75 mg total) by mouth daily. Patient taking differently: Take 75 mg by mouth in the morning. 04/13/18   Ascencion Dike, PA-C  escitalopram (LEXAPRO) 10  MG tablet TAKE 1 TABLET ONCE DAILY. Patient taking differently: Take 10 mg by mouth at bedtime. 10/07/19   Lesleigh Noe, MD  isosorbide mononitrate (IMDUR) 60 MG 24 hr tablet TAKE 1 TABLET BY MOUTH DAILY. Patient taking differently: Take 60 mg by mouth in the morning. 10/09/19   Bhagat, Crista Luria, PA  losartan (COZAAR) 100 MG tablet TAKE 1 TABLET ONCE DAILY. Patient taking differently: Take 100 mg by mouth daily. 04/07/20   Lesleigh Noe, MD  nitroGLYCERIN (NITROSTAT) 0.4 MG SL tablet Place 1 tablet (0.4 mg total) under the tongue every 5 (five) minutes as needed for chest pain. 11/15/18 11/15/19  End, Harrell Gave, MD  pantoprazole (PROTONIX) 40 MG tablet TAKE 1 TABLET BY MOUTH DAILY. Patient taking differently: Take 40 mg by mouth in the morning. 10/07/19   Lesleigh Noe, MD  rosuvastatin (CRESTOR) 40 MG tablet Take 1 tablet (40 mg total) by mouth daily. 05/11/20   Fay Records, MD  tamsulosin (FLOMAX) 0.4 MG CAPS capsule TAKE 1 CAPSULE 30 MINUTES AFTER THE SAME MEAL EACH DAY ONCE DAY. Patient taking differently: Take 0.4 mg by mouth at bedtime. 04/07/20   Lesleigh Noe, MD     Family History  Problem Relation Age of Onset  . Stroke Mother 32  . Aneurysm Father 95    Social History   Socioeconomic History  . Marital status: Married    Spouse name: Deejay Koppelman  . Number of children: 2  . Years of education: 56  . Highest education level: Not on file  Occupational History  . Occupation: Oceanographer.  Tobacco Use  . Smoking status: Former Smoker    Packs/day: 2.00    Years: 25.00    Pack years: 50.00    Types: Cigarettes    Quit date: 03/10/1996    Years since quitting: 24.2  . Smokeless tobacco: Never Used  Vaping Use  . Vaping Use: Never used  Substance and Sexual Activity  . Alcohol use: No    Alcohol/week: 0.0 standard drinks    Comment: in the past drank, quit 1993  . Drug use: No  . Sexual activity: Not Currently  Other Topics Concern  . Not on file  Social  History Narrative   Lives with [name] (Wife) - together for 25 years   Children - [name] and [name] -- one is a Marine scientist in [employer] and other works for [employer] - 18, lives in [place]   Enjoys - playing golf, goes to [employer] and sponsors people as well, church   Support - family, good friends in [place]   Exercise - walks the dog, runs up hills   Diet - improved from before, tries to drink water - low salt or no salt   Social Determinants of Radio broadcast assistant Strain: Not on file  Food Insecurity: Not on file  Transportation Needs: Not on file  Physical Activity: Not on file  Stress: Not on file  Social Connections: Not on file    Review of Systems: A 12 point ROS discussed and pertinent positives are indicated in the HPI above.  All other systems are negative.  Review of Systems  Constitutional: Negative for activity change, fatigue and fever.  HENT: Negative for tinnitus and trouble swallowing.   Eyes: Negative for visual disturbance.  Respiratory: Negative for cough and shortness of breath.   Cardiovascular: Negative for chest pain.  Gastrointestinal: Negative for abdominal pain, diarrhea, nausea and vomiting.  Musculoskeletal: Negative for back pain and gait problem.  Neurological: Negative for dizziness, tremors, seizures, syncope, facial asymmetry, speech difficulty, weakness, light-headedness, numbness and headaches.  Psychiatric/Behavioral: Negative for behavioral problems and confusion.    Vital Signs: There were no vitals taken for this visit.  Physical Exam Vitals reviewed.  HENT:     Mouth/Throat:     Mouth: Mucous membranes are moist.  Cardiovascular:     Rate and Rhythm: Normal rate and regular rhythm.     Heart sounds: Normal heart sounds.  Pulmonary:     Effort: Pulmonary effort is normal.     Breath sounds: Normal breath sounds. No wheezing.  Abdominal:     General: Bowel sounds are normal.     Tenderness: There is no abdominal  tenderness.  Musculoskeletal:        General: Normal range of motion.     Right lower leg: No edema.     Left lower leg: No edema.  Skin:    General: Skin is warm.  Neurological:     Mental Status: He is alert and oriented to person, place, and time.  Psychiatric:        Behavior: Behavior normal.        Judgment: Judgment normal.     Imaging: DG Chest Portable 1 View  Result Date: 05/04/2020 CLINICAL DATA:  Chest and abdominal pain. EXAM: PORTABLE CHEST 1 VIEW COMPARISON:  Remote chest radiograph 12/07/03 FINDINGS: The cardiomediastinal contours are normal. Aortic atherosclerosis. Patchy bibasilar opacities favor atelectasis. Pulmonary vasculature is normal. No confluent consolidation, pleural effusion, or pneumothorax. No acute osseous abnormalities are seen. IMPRESSION: Patchy bibasilar opacities favor atelectasis. Aortic Atherosclerosis (ICD10-I70.0). Electronically Signed   By: Keith Rake M.D.   On: 05/04/2020 19:31   CT Angio Chest/Abd/Pel for Dissection W and/or Wo Contrast  Result Date: 05/04/2020 CLINICAL DATA:  Chest pain EXAM: CT ANGIOGRAPHY CHEST, ABDOMEN AND PELVIS TECHNIQUE: Non-contrast CT of the chest was initially obtained. Multidetector CT imaging through the chest, abdomen and pelvis was performed using the standard protocol during bolus administration of intravenous contrast. Multiplanar reconstructed images and MIPs were obtained and reviewed to evaluate the vascular anatomy. CONTRAST:  60mL OMNIPAQUE IOHEXOL 350 MG/ML SOLN COMPARISON:  Chest x-ray from earlier in the same day. FINDINGS: CTA CHEST FINDINGS Cardiovascular: Initial precontrast images demonstrate atherosclerotic calcifications of the thoracic aorta. No hyperdense crescent to suggest acute injury is seen. Following contrast administration the thoracic aorta and its branches are well visualize without aneurysmal dilatation or dissection. No cardiac enlargement is noted. Coronary calcifications are seen.  Pulmonary artery as visualized appears within normal limits. Mediastinum/Nodes: Thoracic inlet is within normal limits. No sizable hilar or mediastinal adenopathy is noted. The esophagus as visualized is within normal limits. Lungs/Pleura: Lungs are well aerated bilaterally. No focal infiltrate or sizable effusion is seen. Mild emphysematous changes are noted. Musculoskeletal: No chest wall abnormality. No acute or significant osseous findings. Review of the MIP images confirms the above findings. CTA ABDOMEN AND PELVIS  FINDINGS VASCULAR Aorta: Abdominal aorta demonstrates atherosclerotic calcifications. No aneurysmal dilatation or dissection is seen. Celiac: Mild atherosclerotic calcifications are noted. No focal stenosis is seen. SMA: Atherosclerotic calcifications are noted with approximately 50% stenosis secondary to eccentric soft atherosclerotic plaque. Renals: Single renal arteries are identified bilaterally. Mild stenosis is noted in the proximal right renal artery. IMA: Patent without evidence of aneurysm, dissection, vasculitis or significant stenosis. Iliacs: No aneurysmal dilatation or dissection is seen. Veins: No specific venous abnormality is noted although not timed for venous evaluation. Review of the MIP images confirms the above findings. NON-VASCULAR Hepatobiliary: Scattered small hypodensities are noted within the liver likely representing cysts. The gallbladder is decompressed. Pancreas: Unremarkable. No pancreatic ductal dilatation or surrounding inflammatory changes. Spleen: Normal in size without focal abnormality. Adrenals/Urinary Tract: Adrenal glands are within normal limits. Kidneys demonstrate hypodensities bilaterally likely representing cysts. No renal calculi or obstructive changes are seen. The bladder is well distended. No ureteral abnormality is seen Stomach/Bowel: Diverticular changes noted throughout the colon. Appendix is not visualized consistent with a prior surgical history.  Small bowel and stomach appear within normal limits. Lymphatic: No significant lymphadenopathy is identified. Reproductive: Prostate is unremarkable. Other: No abdominal wall hernia or abnormality. No abdominopelvic ascites. Musculoskeletal: No acute or significant osseous findings. Review of the MIP images confirms the above findings. IMPRESSION: CTA of the chest: No aortic injury is identified. No dissection is seen. No definitive pulmonary emboli. No other focal abnormality is seen. Aortic Atherosclerosis (ICD10-I70.0) and Emphysema (ICD10-J43.9). CTA of the abdomen and pelvis: Scattered hypodensities within the liver and kidney consistent with cysts. Diverticulosis without diverticulitis. No aortic abnormality is noted. Mild narrowing of the right renal artery and SMA is seen. Electronically Signed   By: [name] Catalina M.D.   On: 05/04/2020 19:56   IR Radiologist Eval & Mgmt  Result Date: 05/05/2020 EXAM: ESTABLISHED PATIENT OFFICE VISIT CHIEF COMPLAINT: Discuss angiographic findings of 04/14/2020. Current Pain Level: 1-10 HISTORY OF PRESENT ILLNESS: Patient is a 75 year old right handed gentleman who presents accompanied by his wife to discuss the neuro angiographic findings of a diagnostic catheter arteriogram performed on 04/14/2020. The patient previously had treatment of the right internal carotid artery angioplasty with stent placement on 08/2017 for symptomatic severe stenosis. Patient has subsequently been followed with regular neuroimaging studies. Clinically the patient reports to be stable neurologically. He denies any symptoms of dizziness, speech difficulty, vision changes, motor weakness, incoordination, or of gait abnormalities. He denies recent sudden worsening of headaches. He denies any episodes of loss of consciousness, versus near syncope versus seizure like activity. Remains fully active performing all the daily routine activities of life. Diagnosis * : Date . * : Anxiety * : . * : BPH  (benign prostatic hyperplasia) * : . * : Chronic kidney disease * : * : stage 2 . * : Colon polyps * : . * : Erectile dysfunction * : . * : GERD (gastroesophageal reflux disease) * : . * : History of stomach ulcers * : . * : Hypercholesteremia * : . * : Hypertension * : . * : Positive TB test * : * : in the past Past Surgical History: Procedure * : Laterality * : Date . * : APPENDECTOMY * : * : 1970 . * : CATARACT EXTRACTION W/ INTRAOCULAR LENS  IMPLANT, BILATERAL * : * : . * : COLONOSCOPY * : * : 10/2012 . * : ESOPHAGOGASTRODUODENOSCOPY ENDOSCOPY * : * : 10/2012 . * : HERNIA REPAIR * : * : . * :  IR ANGIO INTRA EXTRACRAN SEL COM CAROTID INNOMINATE BILAT MOD SED * : * : 08/09/2017 . * : IR ANGIO INTRA EXTRACRAN SEL COM CAROTID INNOMINATE BILAT MOD SED * : * : 11/08/2018 . * : IR ANGIO VERTEBRAL SEL VERTEBRAL BILAT MOD SED * : * : 08/09/2017 . * : IR ANGIO VERTEBRAL SEL VERTEBRAL BILAT MOD SED * : * : 11/08/2018 . * : IR INTRAVSC STENT CERV CAROTID W/EMB-PROT MOD SED INCL ANGIO * : * : 08/23/2017 . * : LEFT HEART CATH AND CORONARY ANGIOGRAPHY * : N/A * : 11/15/2018 * : Procedure: LEFT HEART CATH AND CORONARY ANGIOGRAPHY; Surgeon: Nelva Bush, MD; Location: [place] [facility]; Service: Cardiovascular; Laterality: N/A; . * : PROSTATE SURGERY * : * : 1999 * : 20 years ago, enlarged prostate . * : RADIOLOGY WITH ANESTHESIA * : N/A * : 08/23/2017 * : Procedure: IR WITH ANESTHESIA STENT PLACEMENT; Surgeon: Luanne Bras, MD; Location: [place]; Service: Radiology; Laterality: N/A; Allergies:Bee venom and Penicillins Medications: Prior to Admission medications Medication * : Sig * : Start Date * : End Date * : Taking? * : Authorizing Provider acetaminophen (TYLENOL) 650 MG CR tablet * : Take 1,300 mg by mouth every 8 (eight) hours as needed for pain. * : * : * : Yes * : [provider] aspirin EC 81 MG tablet * : Take 81 mg by mouth daily. * : * : * : Yes * : [provider] clopidogrel (PLAVIX) 75 MG tablet *  : Take 1 tablet (75 mg total) by mouth daily. * : 04/13/18 * : * : Yes * : Bruning, Kevin, PA-C escitalopram (LEXAPRO) 10 MG tablet * : TAKE 1 TABLET ONCE DAILY. Patient taking differently: Take 10 mg by mouth daily. * : 10/07/19 * : * : Yes * : Lesleigh Noe, MD isosorbide mononitrate (IMDUR) 60 MG 24 hr tablet * : TAKE 1 TABLET BY MOUTH DAILY. Patient taking differently: Take 60 mg by mouth daily. * : 10/09/19 * : * : Yes * : Bhagat, Bhavinkumar, PA losartan (COZAAR) 100 MG tablet * : TAKE 1 TABLET ONCE DAILY. Patient taking differently: Take 100 mg by mouth daily. * : 04/07/20 * : * : Yes * : Lesleigh Noe, MD nitroGLYCERIN (NITROSTAT) 0.4 MG SL tablet * : Place 1 tablet (0.4 mg total) under the tongue every 5 (five) minutes as needed for chest pain. * : 11/15/18 * : 11/15/19 * : Yes * : End, Christopher, MD pantoprazole (PROTONIX) 40 MG tablet * : TAKE 1 TABLET BY MOUTH DAILY. Patient taking differently: Take 40 mg by mouth daily. * : 10/07/19 * : * : Yes * : Lesleigh Noe, MD rosuvastatin (CRESTOR) 20 MG tablet * : TAKE 1 TABLET ONCE DAILY. Patient taking differently: Take 20 mg by mouth daily. * : 04/07/20 * : * : Yes * : Lesleigh Noe, MD tamsulosin (FLOMAX) 0.4 MG CAPS capsule * : TAKE 1 CAPSULE 30 MINUTES AFTER THE SAME MEAL EACH DAY ONCE DAY. Patient taking differently: Take 0.4 mg by mouth daily. * : 04/07/20 * : * : Yes * : Lesleigh Noe, MD Family History Problem * : Relation * : Age of Onset . * : Stroke * : Mother * : 26 . * : Aneurysm * : Father * : 28 Socioeconomic History . * : Marital status: * : Married * : * : Spouse name: * : Tarrence Enck . * :  Number of children: * : 2 . * : Years of education: * : 12 . * : Highest education level: * : Not on file Occupational History . * : Occupation: * : [employer] Tobacco Use . * : Smoking status: * : Former Smoker * : * : Packs/day: * : 2.00 * : * : Years: * : 25.00 * : * : Pack years: * : 50.00 * : * : Types: * : Cigarettes * : * : Quit date: * :  03/10/1996 * : * : Years since quitting: * : 24.1 . * : Smokeless tobacco: * : Never Used Vaping Use . * : Vaping Use: * : Never used Substance and Sexual Activity . * : Alcohol use: * : No * : * : Alcohol/week: * : 0.0 standard drinks * : * : Comment: in the past drank, quit 1993 . * : Drug use: * : No . * : Sexual activity: * : Not Currently Other Topics * : Concern . * : Not on file Social History Narrative * : Lives with [name] (Wife) - together for 25 years * : Children - [name] and [name] -- one is a Marine scientist in [employer] and other works for [employer] * : Granddaughter - 18, lives in [place] * : Enjoys - playing golf, goes to [employer] and sponsors people as well, church * : Support - family, good friends in [place] * : Exercise - walks the dog, runs up hills * : Diet - improved from before, tries to drink water - low salt or no salt Food Insecurity: Not on file Transportation Needs: Not on file Physical Activity: Not on file Stress: Not on file Social Connections: Not on file REVIEW OF SYSTEMS: Unremarkable PHYSICAL EXAMINATION: In no acute distress. Responds appropriately.  Normal eye contact. Neurologically nonfocal. ASSESSMENT AND PLAN: The imaging findings of the recent arteriogram was reviewed. The natural history of unruptured intracranial aneurysms in regards the distal intracranial aneurysm was reviewed. Increase of rupture associated with hypertension, hyperlipidemia, smoking, and family history of rupture or under unruptured intracranial aneurysms was discussed. The patient reports his father having died from a ruptured intracranial aneurysm. Patient was a previous smoker. Risk of rupture was discussed to be in the region of 1-2% per year. High mortality and the morbidity associated with ruptured aneurysm was reviewed. Options discussed regarding management were those of elimination of the aneurysm from the intracranial circulation with endovascular treatment. The other option was continued close surveillance with 6  monthly to yearly either MRAs of the brain. The patient and the spouse expressed the desire to proceed with the treatment of the intracranial aneurysm. This would most likely involve placement of a flow diverter. Risks discussed were those of thromboembolic stroke of 0%-5%, with remote chance of a delayed intracranial hemorrhage. Risk of intra procedure rupture with mortality was also reviewed. Patient will be started on dual antiplatelets at least 7 days prior to procedure. Aspirin 325 mg, and Plavix 300 loading dose and then 75 mg per day. Should the patient be found to be resistant to Plavix, the patient was switched to Brilinta. Regards to the proximal left internal carotid artery findings, the degree of narrowing remained stable around 50%. Also the 2 aneurysmal outpouching involving the bulb region and just distally were also are largely stable. The more proximal larger aneurysmal outpouching, however, does reveal angiographically stasis in this pouch, which potentially with stasis increased the risk of formation of  clots. This would be addressed probably following the treatment of the intracranial aneurysm. Should the patient develop ischemic symptoms related to left anterior intracranial circulation, this may have to be addressed at that time. Patient will be scheduled for endovascular treatment with the general anesthesia as soon as possible. The patient and spouse were asked call should they have any concerns or questions. They leave with good understanding and agreement with the above management plan. Electronically Signed   By: Luanne Bras M.D.   On: 05/04/2020 12:21    Labs:  CBC: Recent Labs    01/13/20 1138 04/14/20 0827 05/04/20 1832 05/18/20 0609  WBC 5.2 5.0 4.5 5.3  HGB 13.8 14.2 13.0 14.2  HCT 41.5 42.2 38.1* 41.7  PLT 170 188 164 167    COAGS: Recent Labs    04/14/20 0827 05/18/20 0609  INR 1.0 1.1  APTT  --  30    BMP: Recent Labs    01/13/20 1138  04/14/20 0827 05/04/20 1832 05/18/20 0609  NA 143 137 137 138  K 4.1 4.2 3.8 4.0  CL 107* 105 104 109  CO2 22 26 29 26   GLUCOSE 98 98 108* 101*  BUN 18 19 18 18   CALCIUM 9.3 9.4 9.5 9.2  CREATININE 1.50* 1.71* 1.68* 1.71*  GFRNONAA 45* 41* 42* 41*  GFRAA 52*  --   --   --     LIVER FUNCTION TESTS: Recent Labs    05/04/20 1832  BILITOT 0.6  AST 18  ALT 13  ALKPHOS 73  PROT 6.6  ALBUMIN 3.7    TUMOR MARKERS: No results for input(s): AFPTM, CEA, CA199, CHROMGRNA in the last 8760 hours.  Assessment and Plan:  Known to NIR-- previously R ICA angioplasty/stent 08/2017 Follow up evaluation revealing L ICA aneurysm and stenosis Pt is scheduled today for L Internal carotid aneurysm(s) embolization and possible L ICA revascularization in IR with Dr Estanislado Pandy Risks and benefits of cerebral angiogram with intervention were discussed with the patient including, but not limited to bleeding, infection, vascular injury, contrast induced renal failure, stroke or even death.  This interventional procedure involves the use of X-rays and because of the nature of the planned procedure, it is possible that we will have prolonged use of X-ray fluoroscopy.  Potential radiation risks to you include (but are not limited to) the following: - A slightly elevated risk for cancer  several years later in life. This risk is typically less than 0.5% percent. This risk is low in comparison to the normal incidence of human cancer, which is 33% for women and 50% for men according to the [place]. - Radiation induced injury can include skin redness, resembling a rash, tissue breakdown / ulcers and hair loss (which can be temporary or permanent).   The likelihood of either of these occurring depends on the difficulty of the procedure and whether you are sensitive to radiation due to previous procedures, disease, or genetic conditions.   IF your procedure requires a prolonged use of  radiation, you will be notified and given written instructions for further action.  It is your responsibility to monitor the irradiated area for the 2 weeks following the procedure and to notify your physician if you are concerned that you have suffered a radiation induced injury.    All of the patient's questions were answered, patient is agreeable to proceed.  Consent signed and in chart.  Pt is aware of procedure today and plan for intervention.  If intervention  proceeds- he will be admitted to Neuro ICU overnight and plan for discharge in am He is agreeable to proceed  Thank you for this interesting consult.  I greatly enjoyed meeting JADIS MIKA and look forward to participating in their care.  A copy of this report was sent to the requesting provider on this date.  Electronically Signed: Lavonia Drafts, PA-C 05/18/2020, 8:01 AM   I spent a total of    25 Minutes in face to face in clinical consultation, greater than 50% of which was counseling/coordinating care for L ICA aneurysm embolization /possible revascularization

## 2020-05-18 NOTE — Anesthesia Preprocedure Evaluation (Addendum)
Anesthesia Evaluation  Patient identified by MRN, date of birth, ID band Patient awake    Reviewed: Allergy & Precautions, NPO status , Patient's Chart, lab work & pertinent test results  Airway Mallampati: I  TM Distance: >3 FB Neck ROM: Full    Dental  (+) Missing, Dental Advisory Given,    Pulmonary former smoker,    Pulmonary exam normal        Cardiovascular hypertension, + angina + CAD   Rhythm:Regular Rate:Normal     Neuro/Psych PSYCHIATRIC DISORDERS Anxiety Depression    GI/Hepatic Neg liver ROS, GERD  ,  Endo/Other  negative endocrine ROS  Renal/GU CRFRenal disease     Musculoskeletal negative musculoskeletal ROS (+)   Abdominal Normal abdominal exam  (+)   Peds  Hematology negative hematology ROS (+)   Anesthesia Other Findings   Reproductive/Obstetrics                            Anesthesia Physical Anesthesia Plan  ASA: III  Anesthesia Plan: General   Post-op Pain Management:    Induction: Intravenous  PONV Risk Score and Plan: 2 and Ondansetron and Dexamethasone  Airway Management Planned: Oral ETT  Additional Equipment: Arterial line  Intra-op Plan:   Post-operative Plan: Possible Post-op intubation/ventilation  Informed Consent: I have reviewed the patients History and Physical, chart, labs and discussed the procedure including the risks, benefits and alternatives for the proposed anesthesia with the patient or authorized representative who has indicated his/her understanding and acceptance.       Plan Discussed with: CRNA  Anesthesia Plan Comments: (- 2 large bore IV's)       Anesthesia Quick Evaluation

## 2020-05-18 NOTE — Progress Notes (Signed)
Interventional Radiology Brief Note:  Patient assessed at bedside alongside Dr. Estanislado Pandy s/p pipeline diverter placement this AM.  He is awake, alert, oriented.  Denies headaches, nausea, vomiting, blurry vision.  Moving all extremities.   Radial band in place with difficulty releasing due to rebleeding. Hemostasis at present with re-inflation.  Pharmacy to adjust heparin as needed.   Plan to admit to NeuroICU overnight.  Will assess tomorrow AM for possible discharge.   Brynda Greathouse, MS RD PA-C 3:39 PM

## 2020-05-18 NOTE — Anesthesia Procedure Notes (Signed)
Arterial Line Insertion Start/End4/12/2020 8:05 AM, 05/18/2020 8:20 AM Performed by: Leonor Liv, CRNA, CRNA  Patient location: Pre-op. Preanesthetic checklist: patient identified, IV checked, site marked, risks and benefits discussed, surgical consent, monitors and equipment checked, pre-op evaluation, timeout performed and anesthesia consent Lidocaine 1% used for infiltration Left, radial was placed Catheter size: 20 G Hand hygiene performed  and maximum sterile barriers used  Allen's test indicative of satisfactory collateral circulation Attempts: 2 Procedure performed without using ultrasound guided technique. Following insertion, Biopatch and dressing applied. Patient tolerated the procedure well with no immediate complications.

## 2020-05-18 NOTE — Sedation Documentation (Signed)
Bedside report given to Robin-RN PACU. +3 radial pulse palpated, no hematoma noted. No further drainage noted from site after 2 cc of air added. See flowsheets. Quick neuro assessment obtained. Patient is alert and oriented, answers questions appropriately. Moving all extremities appropriately, equal grip strength intact.

## 2020-05-18 NOTE — Progress Notes (Signed)
ANTICOAGULATION CONSULT NOTE - Initial Consult  Pharmacy Consult for heparin Indication: post-neuro IR procedure   Allergies  Allergen Reactions  . Bee Venom Anaphylaxis and Shortness Of Breath  . Penicillins Rash    PATIENT HAS HAD A PCN REACTION WITH IMMEDIATE RASH, FACIAL/TONGUE/THROAT SWELLING, SOB, OR LIGHTHEADEDNESS WITH HYPOTENSION:  #  #  YES  #  #  Has patient had a PCN reaction causing severe rash involving mucus membranes or skin necrosis: no Has patient had a PCN reaction that required hospitalization: no Has patient had a PCN reaction occurring within the last 10 years: no     Patient Measurements: Height: 5\' 9"  (175.3 cm) Weight: 77.1 kg (170 lb) IBW/kg (Calculated) : 70.7   Vital Signs: Temp: 97.7 F (36.5 C) (04/11 0613) Temp Source: Oral (04/11 0613) BP: 122/85 (04/11 3354) Pulse Rate: 94 (04/11 0613)  Labs: Recent Labs    05/18/20 0609  HGB 14.2  HCT 41.7  PLT 167  APTT 30  LABPROT 13.9  INR 1.1  CREATININE 1.71*    Estimated Creatinine Clearance: 37.3 mL/min (A) (by C-G formula based on SCr of 1.71 mg/dL (H)).   Medical History: Past Medical History:  Diagnosis Date  . Anxiety   . BPH (benign prostatic hyperplasia)   . Cerebral aneurysm   . Chronic kidney disease    stage 2  . Colon polyps   . Coronary artery disease    mild-mod non-obstructive CAD  . Erectile dysfunction   . GERD (gastroesophageal reflux disease)   . History of kidney stones   . History of stomach ulcers   . Hypercholesteremia   . Hypertension   . 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 acetaminophen (TYLENOL) 325 MG tablet Take 650 mg by mouth every 6 (six) hours as needed (pain).   Past Week at Unknown time  . amLODipine (NORVASC) 2.5 MG tablet Take 0.5 tablets (1.25 mg total) by mouth daily. Take one-half tablet daily. 45 tablet 3 05/18/2020 at 0440  . aspirin EC 81 MG  tablet Take 81 mg by mouth in the morning.   05/18/2020 at 0440  . clopidogrel (PLAVIX) 75 MG tablet Take 1 tablet (75 mg total) by mouth daily. (Patient taking differently: Take 75 mg by mouth in the morning.) 30 tablet 3 05/18/2020 at 0440  . escitalopram (LEXAPRO) 10 MG tablet TAKE 1 TABLET ONCE DAILY. (Patient taking differently: Take 10 mg by mouth at bedtime.) 90 tablet 3 05/17/2020 at Unknown time  . isosorbide mononitrate (IMDUR) 60 MG 24 hr tablet TAKE 1 TABLET BY MOUTH DAILY. (Patient taking differently: Take 60 mg by mouth in the morning.) 90 tablet 2 05/18/2020 at 0440  . losartan (COZAAR) 100 MG tablet TAKE 1 TABLET ONCE DAILY. (Patient taking differently: Take 100 mg by mouth daily.) 90 tablet 0 Past Week at Unknown time  . nitroGLYCERIN (NITROSTAT) 0.4 MG SL tablet Place 1 tablet (0.4 mg total) under the tongue every 5 (five) minutes as needed for chest pain. 25 tablet prn   . pantoprazole (PROTONIX) 40 MG tablet TAKE 1 TABLET BY MOUTH DAILY. (Patient taking differently: Take 40 mg by mouth in the morning.) 90 tablet 3 05/18/2020 at 0440  . rosuvastatin (CRESTOR) 40 MG tablet Take 1 tablet (40 mg total) by mouth daily. 90 tablet 3 05/17/2020 at Unknown time  . tamsulosin (FLOMAX) 0.4 MG CAPS capsule TAKE 1  CAPSULE 30 MINUTES AFTER THE SAME MEAL EACH DAY ONCE DAY. 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 H/H and Plt wnl.  Goal of Therapy:  Heparin level 0.1-0.25 units/ml Monitor platelets by anticoagulation protocol: Yes   Plan:  -Start IV heparin at 600 units/hr  -F/u 8 hr HL -Monitor for s/s of bleeding. -IV heparin to stop at 8 AM tomorrow per neuro IR protocol   Albertina Parr, PharmD., BCPS, BCCCP Clinical Pharmacist Please refer to [facility] for unit-specific pharmacist

## 2020-05-18 NOTE — Anesthesia Procedure Notes (Signed)
Procedure Name: Intubation Date/Time: 05/18/2020 9:08 AM Performed by: Leonor Liv, CRNA Pre-anesthesia Checklist: Patient identified, Emergency Drugs available, Suction available and Patient being monitored Patient Re-evaluated:Patient Re-evaluated prior to induction Oxygen Delivery Method: Circle System Utilized Preoxygenation: Pre-oxygenation with 100% oxygen Induction Type: IV induction Ventilation: Mask ventilation without difficulty Laryngoscope Size: Mac and 4 Grade View: Grade I Tube type: Oral Tube size: 7.5 mm Number of attempts: 1 Airway Equipment and Method: Stylet and Oral airway Placement Confirmation: ETT inserted through vocal cords under direct vision,  positive ETCO2 and breath sounds checked- equal and bilateral Secured at: 23 cm Tube secured with: Tape Dental Injury: Teeth and Oropharynx as per pre-operative assessment

## 2020-05-18 NOTE — Progress Notes (Signed)
Pt slightly ozzing at radial site. 1cc being removed at a time slowly. MD notified. Ordered to Stop Heparin for 1 hour then restart at same rate. Pharmacy notified and they will adjust PTT draw time. No other issues. Heparin due to start at 1909

## 2020-05-18 NOTE — Sedation Documentation (Signed)
ACT 190

## 2020-05-18 NOTE — Sedation Documentation (Signed)
Pipeline device deployed 

## 2020-05-19 ENCOUNTER — Encounter (HOSPITAL_COMMUNITY): Payer: Self-pay | Admitting: Interventional Radiology

## 2020-05-19 ENCOUNTER — Encounter: Payer: PPO | Admitting: Physical Therapy

## 2020-05-19 DIAGNOSIS — I671 Cerebral aneurysm, nonruptured: Secondary | ICD-10-CM | POA: Diagnosis not present

## 2020-05-19 DIAGNOSIS — I63232 Cerebral infarction due to unspecified occlusion or stenosis of left carotid arteries: Secondary | ICD-10-CM | POA: Diagnosis not present

## 2020-05-19 LAB — CBC WITH DIFFERENTIAL/PLATELET
Abs Immature Granulocytes: 0.03 10*3/uL (ref 0.00–0.07)
Basophils Absolute: 0 10*3/uL (ref 0.0–0.1)
Basophils Relative: 0 %
Eosinophils Absolute: 0 10*3/uL (ref 0.0–0.5)
Eosinophils Relative: 0 %
HCT: 36.5 % — ABNORMAL LOW (ref 39.0–52.0)
Hemoglobin: 12.1 g/dL — ABNORMAL LOW (ref 13.0–17.0)
Immature Granulocytes: 0 %
Lymphocytes Relative: 10 %
Lymphs Abs: 0.7 10*3/uL (ref 0.7–4.0)
MCH: 30.1 pg (ref 26.0–34.0)
MCHC: 33.2 g/dL (ref 30.0–36.0)
MCV: 90.8 fL (ref 80.0–100.0)
Monocytes Absolute: 0.7 10*3/uL (ref 0.1–1.0)
Monocytes Relative: 10 %
Neutro Abs: 6.2 10*3/uL (ref 1.7–7.7)
Neutrophils Relative %: 80 %
Platelets: 162 10*3/uL (ref 150–400)
RBC: 4.02 MIL/uL — ABNORMAL LOW (ref 4.22–5.81)
RDW: 13 % (ref 11.5–15.5)
WBC: 7.7 10*3/uL (ref 4.0–10.5)
nRBC: 0 % (ref 0.0–0.2)

## 2020-05-19 LAB — BASIC METABOLIC PANEL
Anion gap: 8 (ref 5–15)
BUN: 21 mg/dL (ref 8–23)
CO2: 21 mmol/L — ABNORMAL LOW (ref 22–32)
Calcium: 9 mg/dL (ref 8.9–10.3)
Chloride: 108 mmol/L (ref 98–111)
Creatinine, Ser: 1.5 mg/dL — ABNORMAL HIGH (ref 0.61–1.24)
GFR, Estimated: 48 mL/min — ABNORMAL LOW (ref >60)
Glucose, Bld: 114 mg/dL — ABNORMAL HIGH (ref 70–99)
Potassium: 3.8 mmol/L (ref 3.5–5.1)
Sodium: 137 mmol/L (ref 135–145)

## 2020-05-19 LAB — HEPARIN LEVEL (UNFRACTIONATED): Heparin Unfractionated: <0.1 IU/mL — ABNORMAL LOW (ref 0.30–0.70)

## 2020-05-19 LAB — POCT ACTIVATED CLOTTING TIME: Activated Clotting Time: 190 seconds

## 2020-05-19 LAB — MRSA PCR SCREENING: MRSA by PCR: NEGATIVE

## 2020-05-19 MED ORDER — LOSARTAN POTASSIUM 50 MG PO TABS
100.0000 mg | ORAL_TABLET | Freq: Every day | ORAL | Status: DC
  Administered 2020-05-19: 100 mg via ORAL
  Filled 2020-05-19: qty 2

## 2020-05-19 MED ORDER — AMLODIPINE BESYLATE 2.5 MG PO TABS
2.5000 mg | ORAL_TABLET | Freq: Every day | ORAL | Status: DC
  Administered 2020-05-19: 2.5 mg via ORAL
  Filled 2020-05-19: qty 1

## 2020-05-19 MED ORDER — ISOSORBIDE MONONITRATE ER 30 MG PO TB24
60.0000 mg | ORAL_TABLET | Freq: Every day | ORAL | Status: DC
  Filled 2020-05-19: qty 2

## 2020-05-19 MED ORDER — HEPARIN (PORCINE) 25000 UT/250ML-% IV SOLN
800.0000 [IU]/h | INTRAVENOUS | Status: DC
  Filled 2020-05-19: qty 250

## 2020-05-19 NOTE — Discharge Summary (Signed)
Patient ID: John Salazar MRN: [id number] DOB/AGE: [birth year] 76 y.o.  Admit date: 05/18/2020 Discharge date: 05/19/2020  Supervising Physician: Luanne Bras  Patient Status: [facility] - In-pt  Admission Diagnoses: Brain aneurysm  Discharge Diagnoses:  Active Problems:   Brain aneurysm   Discharged Condition: stable  Hospital Course:  Patient presented to [facility] 05/18/2020 for an image-guided cerebral arteriogram with embolization of left ICA supraclinoid aneurysm using a pipeline flex flow diverter via right radial approach by Dr. Estanislado Pandy. Procedure occurred without major complications and patient was transferred to neuro ICU for overnight observation.  Patient awake and alert sitting in bed with no complaints at this time. Right radial puncture site stable. Moving all extremities. Plan to discharge home today and follow-up with Dr. Estanislado Pandy in clinic 2 weeks after discharge (at this time, will also discuss management of left ICA stenosis).   Consults: None  Significant Diagnostic Studies: DG Chest Portable 1 View  Result Date: 05/04/2020 CLINICAL DATA:  Chest and abdominal pain. EXAM: PORTABLE CHEST 1 VIEW COMPARISON:  Remote chest radiograph 12/07/03 FINDINGS: The cardiomediastinal contours are normal. Aortic atherosclerosis. Patchy bibasilar opacities favor atelectasis. Pulmonary vasculature is normal. No confluent consolidation, pleural effusion, or pneumothorax. No acute osseous abnormalities are seen. IMPRESSION: Patchy bibasilar opacities favor atelectasis. Aortic Atherosclerosis (ICD10-I70.0). Electronically Signed   By: Keith Rake M.D.   On: 05/04/2020 19:31   CT Angio Chest/Abd/Pel for Dissection W and/or Wo Contrast  Result Date: 05/04/2020 CLINICAL DATA:  Chest pain EXAM: CT ANGIOGRAPHY CHEST, ABDOMEN AND PELVIS TECHNIQUE: Non-contrast CT of the chest was initially obtained. Multidetector CT imaging through the chest, abdomen and pelvis was performed using the  standard protocol during bolus administration of intravenous contrast. Multiplanar reconstructed images and MIPs were obtained and reviewed to evaluate the vascular anatomy. CONTRAST:  78mL OMNIPAQUE IOHEXOL 350 MG/ML SOLN COMPARISON:  Chest x-ray from earlier in the same day. FINDINGS: CTA CHEST FINDINGS Cardiovascular: Initial precontrast images demonstrate atherosclerotic calcifications of the thoracic aorta. No hyperdense crescent to suggest acute injury is seen. Following contrast administration the thoracic aorta and its branches are well visualize without aneurysmal dilatation or dissection. No cardiac enlargement is noted. Coronary calcifications are seen. Pulmonary artery as visualized appears within normal limits. Mediastinum/Nodes: Thoracic inlet is within normal limits. No sizable hilar or mediastinal adenopathy is noted. The esophagus as visualized is within normal limits. Lungs/Pleura: Lungs are well aerated bilaterally. No focal infiltrate or sizable effusion is seen. Mild emphysematous changes are noted. Musculoskeletal: No chest wall abnormality. No acute or significant osseous findings. Review of the MIP images confirms the above findings. CTA ABDOMEN AND PELVIS FINDINGS VASCULAR Aorta: Abdominal aorta demonstrates atherosclerotic calcifications. No aneurysmal dilatation or dissection is seen. Celiac: Mild atherosclerotic calcifications are noted. No focal stenosis is seen. SMA: Atherosclerotic calcifications are noted with approximately 50% stenosis secondary to eccentric soft atherosclerotic plaque. Renals: Single renal arteries are identified bilaterally. Mild stenosis is noted in the proximal right renal artery. IMA: Patent without evidence of aneurysm, dissection, vasculitis or significant stenosis. Iliacs: No aneurysmal dilatation or dissection is seen. Veins: No specific venous abnormality is noted although not timed for venous evaluation. Review of the MIP images confirms the above  findings. NON-VASCULAR Hepatobiliary: Scattered small hypodensities are noted within the liver likely representing cysts. The gallbladder is decompressed. Pancreas: Unremarkable. No pancreatic ductal dilatation or surrounding inflammatory changes. Spleen: Normal in size without focal abnormality. Adrenals/Urinary Tract: Adrenal glands are within normal limits. Kidneys demonstrate hypodensities bilaterally likely representing cysts. No  renal calculi or obstructive changes are seen. The bladder is well distended. No ureteral abnormality is seen Stomach/Bowel: Diverticular changes noted throughout the colon. Appendix is not visualized consistent with a prior surgical history. Small bowel and stomach appear within normal limits. Lymphatic: No significant lymphadenopathy is identified. Reproductive: Prostate is unremarkable. Other: No abdominal wall hernia or abnormality. No abdominopelvic ascites. Musculoskeletal: No acute or significant osseous findings. Review of the MIP images confirms the above findings. IMPRESSION: CTA of the chest: No aortic injury is identified. No dissection is seen. No definitive pulmonary emboli. No other focal abnormality is seen. Aortic Atherosclerosis (ICD10-I70.0) and Emphysema (ICD10-J43.9). CTA of the abdomen and pelvis: Scattered hypodensities within the liver and kidney consistent with cysts. Diverticulosis without diverticulitis. No aortic abnormality is noted. Mild narrowing of the right renal artery and SMA is seen. Electronically Signed   By: [name] Catalina M.D.   On: 05/04/2020 19:56   IR Radiologist Eval & Mgmt  Result Date: 05/05/2020 EXAM: ESTABLISHED PATIENT OFFICE VISIT CHIEF COMPLAINT: Discuss angiographic findings of 04/14/2020. Current Pain Level: 1-10 HISTORY OF PRESENT ILLNESS: Patient is a 75 year old right handed gentleman who presents accompanied by his wife to discuss the neuro angiographic findings of a diagnostic catheter arteriogram performed on 04/14/2020. The  patient previously had treatment of the right internal carotid artery angioplasty with stent placement on 08/2017 for symptomatic severe stenosis. Patient has subsequently been followed with regular neuroimaging studies. Clinically the patient reports to be stable neurologically. He denies any symptoms of dizziness, speech difficulty, vision changes, motor weakness, incoordination, or of gait abnormalities. He denies recent sudden worsening of headaches. He denies any episodes of loss of consciousness, versus near syncope versus seizure like activity. Remains fully active performing all the daily routine activities of life. Diagnosis * : Date . * : Anxiety * : . * : BPH (benign prostatic hyperplasia) * : . * : Chronic kidney disease * : * : stage 2 . * : Colon polyps * : . * : Erectile dysfunction * : . * : GERD (gastroesophageal reflux disease) * : . * : History of stomach ulcers * : . * : Hypercholesteremia * : . * : Hypertension * : . * : Positive TB test * : * : in the past Past Surgical History: Procedure * : Laterality * : Date . * : APPENDECTOMY * : * : 1970 . * : CATARACT EXTRACTION W/ INTRAOCULAR LENS  IMPLANT, BILATERAL * : * : . * : COLONOSCOPY * : * : 10/2012 . * : ESOPHAGOGASTRODUODENOSCOPY ENDOSCOPY * : * : 10/2012 . * : HERNIA REPAIR * : * : . * : IR ANGIO INTRA EXTRACRAN SEL COM CAROTID INNOMINATE BILAT MOD SED * : * : 08/09/2017 . * : IR ANGIO INTRA EXTRACRAN SEL COM CAROTID INNOMINATE BILAT MOD SED * : * : 11/08/2018 . * : IR ANGIO VERTEBRAL SEL VERTEBRAL BILAT MOD SED * : * : 08/09/2017 . * : IR ANGIO VERTEBRAL SEL VERTEBRAL BILAT MOD SED * : * : 11/08/2018 . * : IR INTRAVSC STENT CERV CAROTID W/EMB-PROT MOD SED INCL ANGIO * : * : 08/23/2017 . * : LEFT HEART CATH AND CORONARY ANGIOGRAPHY * : N/A * : 11/15/2018 * : Procedure: LEFT HEART CATH AND CORONARY ANGIOGRAPHY; Surgeon: Nelva Bush, MD; Location: [facility]; Service: Cardiovascular; Laterality: N/A; . * : PROSTATE SURGERY * : * : 1999 * :  20 years ago, enlarged prostate . * :  RADIOLOGY WITH ANESTHESIA * : N/A * : 08/23/2017 * : Procedure: IR WITH ANESTHESIA STENT PLACEMENT; Surgeon: Luanne Bras, MD; Location: [facility]; Service: Radiology; Laterality: N/A; Allergies:Bee venom and Penicillins Medications: Prior to Admission medications Medication * : Sig * : Start Date * : End Date * : Taking? * : Authorizing Provider acetaminophen (TYLENOL) 650 MG CR tablet * : Take 1,300 mg by mouth every 8 (eight) hours as needed for pain. * : * : * : Yes * : [provider] aspirin EC 81 MG tablet * : Take 81 mg by mouth daily. * : * : * : Yes * : [provider] clopidogrel (PLAVIX) 75 MG tablet * : Take 1 tablet (75 mg total) by mouth daily. * : 04/13/18 * : * : Yes * : Bruning, Kevin, PA-C escitalopram (LEXAPRO) 10 MG tablet * : TAKE 1 TABLET ONCE DAILY. Patient taking differently: Take 10 mg by mouth daily. * : 10/07/19 * : * : Yes * : Lesleigh Noe, MD isosorbide mononitrate (IMDUR) 60 MG 24 hr tablet * : TAKE 1 TABLET BY MOUTH DAILY. Patient taking differently: Take 60 mg by mouth daily. * : 10/09/19 * : * : Yes * : Bhagat, Bhavinkumar, PA losartan (COZAAR) 100 MG tablet * : TAKE 1 TABLET ONCE DAILY. Patient taking differently: Take 100 mg by mouth daily. * : 04/07/20 * : * : Yes * : Lesleigh Noe, MD nitroGLYCERIN (NITROSTAT) 0.4 MG SL tablet * : Place 1 tablet (0.4 mg total) under the tongue every 5 (five) minutes as needed for chest pain. * : 11/15/18 * : 11/15/19 * : Yes * : End, Christopher, MD pantoprazole (PROTONIX) 40 MG tablet * : TAKE 1 TABLET BY MOUTH DAILY. Patient taking differently: Take 40 mg by mouth daily. * : 10/07/19 * : * : Yes * : Lesleigh Noe, MD rosuvastatin (CRESTOR) 20 MG tablet * : TAKE 1 TABLET ONCE DAILY. Patient taking differently: Take 20 mg by mouth daily. * : 04/07/20 * : * : Yes * : Lesleigh Noe, MD tamsulosin (FLOMAX) 0.4 MG CAPS capsule * : TAKE 1 CAPSULE 30 MINUTES AFTER THE SAME MEAL EACH DAY ONCE DAY.  Patient taking differently: Take 0.4 mg by mouth daily. * : 04/07/20 * : * : Yes * : Lesleigh Noe, MD Family History Problem * : Relation * : Age of Onset . * : Stroke * : Mother * : 57 . * : Aneurysm * : Father * : 33 Socioeconomic History . * : Marital status: * : Married * : * : Spouse name: * : pamela . * : Number of children: * : 2 . * : Years of education: * : 12 . * : Highest education level: * : Not on file Occupational History . * : Occupation: * : [employer] Tobacco Use . * : Smoking status: * : Former Smoker * : * : Packs/day: * : 2.00 * : * : Years: * : 25.00 * : * : Pack years: * : 50.00 * : * : Types: * : Cigarettes * : * : Quit date: * : 03/10/1996 * : * : Years since quitting: * : 24.1 . * : Smokeless tobacco: * : Never Used Vaping Use . * : Vaping Use: * : Never used Substance and Sexual Activity . * : Alcohol use: * : No * : * : Alcohol/week: * : 0.0  standard drinks * : * : Comment: in the past drank, quit 1993 . * : Drug use: * : No . * : Sexual activity: * : Not Currently Other Topics * : Concern . * : Not on file Social History Narrative * : Lives with [name] (Wife) - together for 25 years * : Children - [name] and [name] -- one is a Marine scientist in [employer] and other works for [employer] * : Granddaughter - 18, lives in [place] * : Enjoys - playing golf, goes to [employer] and sponsors people as well, church * : Support - family, good friends in [place] * : Exercise - walks the dog, runs up hills * : Diet - improved from before, tries to drink water - low salt or no salt Food Insecurity: Not on file Transportation Needs: Not on file Physical Activity: Not on file Stress: Not on file Social Connections: Not on file REVIEW OF SYSTEMS: Unremarkable PHYSICAL EXAMINATION: In no acute distress. Responds appropriately.  Normal eye contact. Neurologically nonfocal. ASSESSMENT AND PLAN: The imaging findings of the recent arteriogram was reviewed. The natural history of unruptured intracranial aneurysms in  regards the distal intracranial aneurysm was reviewed. Increase of rupture associated with hypertension, hyperlipidemia, smoking, and family history of rupture or under unruptured intracranial aneurysms was discussed. The patient reports his father having died from a ruptured intracranial aneurysm. Patient was a previous smoker. Risk of rupture was discussed to be in the region of 1-2% per year. High mortality and the morbidity associated with ruptured aneurysm was reviewed. Options discussed regarding management were those of elimination of the aneurysm from the intracranial circulation with endovascular treatment. The other option was continued close surveillance with 6 monthly to yearly either MRAs of the brain. The patient and the spouse expressed the desire to proceed with the treatment of the intracranial aneurysm. This would most likely involve placement of a flow diverter. Risks discussed were those of thromboembolic stroke of 8%-6%, with remote chance of a delayed intracranial hemorrhage. Risk of intra procedure rupture with mortality was also reviewed. Patient will be started on dual antiplatelets at least 7 days prior to procedure. Aspirin 325 mg, and Plavix 300 loading dose and then 75 mg per day. Should the patient be found to be resistant to Plavix, the patient was switched to Brilinta. Regards to the proximal left internal carotid artery findings, the degree of narrowing remained stable around 50%. Also the 2 aneurysmal outpouching involving the bulb region and just distally were also are largely stable. The more proximal larger aneurysmal outpouching, however, does reveal angiographically stasis in this pouch, which potentially with stasis increased the risk of formation of clots. This would be addressed probably following the treatment of the intracranial aneurysm. Should the patient develop ischemic symptoms related to left anterior intracranial circulation, this may have to be addressed at that  time. Patient will be scheduled for endovascular treatment with the general anesthesia as soon as possible. The patient and spouse were asked call should they have any concerns or questions. They leave with good understanding and agreement with the above management plan. Electronically Signed   By: Luanne Bras M.D.   On: 05/04/2020 12:21    Treatments: Endovascular embolization of left ICA supraclinoid aneurysm using a pipeline flex flow diverter  Discharge Exam: Blood pressure 132/70, pulse 81, temperature 99.3 F (37.4 C), temperature source Oral, resp. rate 16, height 5\' 9"  (1.753 m), weight 170 lb (77.1 kg), SpO2 96 %. Physical Exam  Vitals and nursing note reviewed.  Constitutional:      General: He is not in acute distress. Cardiovascular:     Rate and Rhythm: Normal rate and regular rhythm.     Heart sounds: Normal heart sounds. No murmur heard.   Pulmonary:     Effort: Pulmonary effort is normal. No respiratory distress.     Breath sounds: Normal breath sounds. No wheezing.  Skin:    General: Skin is warm and dry.     Comments: Right radial puncture site soft without active bleeding or hematoma.  Neurological:     Mental Status: He is alert.     Comments: Alert, awake, and oriented x3. Speech and comprehension intact. PERRL bilaterally. Can spontaneously move all extremities. Distal pulses (radial) palpable bilaterally.     Disposition: Discharge disposition: 01-Home or Self Care       Discharge Instructions    Call MD for:  difficulty breathing, headache or visual disturbances   Complete by: As directed    Call MD for:  extreme fatigue   Complete by: As directed    Call MD for:  hives   Complete by: As directed    Call MD for:  persistant dizziness or light-headedness   Complete by: As directed    Call MD for:  persistant nausea and vomiting   Complete by: As directed    Call MD for:  redness, tenderness, or signs of infection (pain, swelling,  redness, odor or green/yellow discharge around incision site)   Complete by: As directed    Call MD for:  severe uncontrolled pain   Complete by: As directed    Call MD for:  temperature >100.4   Complete by: As directed    Diet - low sodium heart healthy   Complete by: As directed    Discharge instructions   Complete by: As directed    Continue taking Plavix 75 mg once daily. Continue taking Aspirin 81 mg once daily. Stay hydrated by drinking plenty of water.   Driving Restrictions   Complete by: As directed    No driving self until 2 week follow-up. Ok to be passenger in car during this time.   Increase activity slowly   Complete by: As directed    Lifting restrictions   Complete by: As directed    No bending, stooping, or lifting more than 10 pounds for 2 weeks.   Other Restrictions   Complete by: As directed    Ok to shower 24 hours after discharge. Recommend showering with bandage on right wrist, remove bandage immediately after showering and pat area dry. No submerging (swimming, bathing) right wrist for 7 days post-procedure.   Remove dressing in 24 hours   Complete by: As directed    Ok to remove dressing from right wrist 24 hours after discharge following first shower (see showering instructions for more information on this). No further dressing changes needed after this- ensure area remains clean/dry until fully healed.     Allergies as of 05/19/2020      Reactions   Bee Venom Anaphylaxis, Shortness Of Breath   Penicillins Rash   PATIENT HAS HAD A PCN REACTION WITH IMMEDIATE RASH, FACIAL/TONGUE/THROAT SWELLING, SOB, OR LIGHTHEADEDNESS WITH HYPOTENSION:  #  #  YES  #  #  Has patient had a PCN reaction causing severe rash involving mucus membranes or skin necrosis: no Has patient had a PCN reaction that required hospitalization: no Has patient had a PCN reaction occurring within the last  10 years: no      Medication List    TAKE these medications   acetaminophen 325  MG tablet Commonly known as: TYLENOL Take 650 mg by mouth every 6 (six) hours as needed (pain).   amLODipine 2.5 MG tablet Commonly known as: NORVASC Take 0.5 tablets (1.25 mg total) by mouth daily. Take one-half tablet daily.   aspirin EC 81 MG tablet Take 81 mg by mouth in the morning.   clopidogrel 75 MG tablet Commonly known as: PLAVIX Take 1 tablet (75 mg total) by mouth daily. What changed: when to take this   escitalopram 10 MG tablet Commonly known as: LEXAPRO TAKE 1 TABLET ONCE DAILY. What changed: when to take this   isosorbide mononitrate 60 MG 24 hr tablet Commonly known as: IMDUR TAKE 1 TABLET BY MOUTH DAILY. What changed: when to take this   losartan 100 MG tablet Commonly known as: COZAAR TAKE 1 TABLET ONCE DAILY.   nitroGLYCERIN 0.4 MG SL tablet Commonly known as: Nitrostat Place 1 tablet (0.4 mg total) under the tongue every 5 (five) minutes as needed for chest pain.   pantoprazole 40 MG tablet Commonly known as: PROTONIX TAKE 1 TABLET BY MOUTH DAILY. What changed: when to take this   rosuvastatin 40 MG tablet Commonly known as: CRESTOR Take 1 tablet (40 mg total) by mouth daily.   tamsulosin 0.4 MG Caps capsule Commonly known as: FLOMAX TAKE 1 CAPSULE 30 MINUTES AFTER THE SAME MEAL EACH DAY ONCE DAY. What changed:   how much to take  how to take this  when to take this  additional instructions       Follow-up Information    Luanne Bras, MD Follow up in 2 week(s).   Specialties: Interventional Radiology, Radiology Why: Please follow-up with Dr. Estanislado Pandy in clinic 2 weeks after discharge. Our office will call you to set up this appointment. Contact information: [place] [address] [phone]                Electronically Signed: Earley Abide, PA-C 05/19/2020, 9:54 AM   I have spent Less Than 30 Minutes discharging John Salazar.

## 2020-05-19 NOTE — Progress Notes (Signed)
[facility] for heparin Indication: post-neuro IR procedure   Allergies  Allergen Reactions  . Bee Venom Anaphylaxis and Shortness Of Breath  . Penicillins Rash    PATIENT HAS HAD A PCN REACTION WITH IMMEDIATE RASH, FACIAL/TONGUE/THROAT SWELLING, SOB, OR LIGHTHEADEDNESS WITH HYPOTENSION:  #  #  YES  #  #  Has patient had a PCN reaction causing severe rash involving mucus membranes or skin necrosis: no Has patient had a PCN reaction that required hospitalization: no Has patient had a PCN reaction occurring within the last 10 years: no     Patient Measurements: Height: 5\' 9"  (175.3 cm) Weight: 77.1 kg (170 lb) IBW/kg (Calculated) : 70.7   Vital Signs: Temp: 98.2 F (36.8 C) (04/12 0000) Temp Source: Oral (04/12 0000) BP: 132/88 (04/12 0245) Pulse Rate: 95 (04/12 0245)  Labs: Recent Labs    05/18/20 0609 05/19/20 0113  HGB 14.2  --   HCT 41.7  --   PLT 167  --   APTT 30  --   LABPROT 13.9  --   INR 1.1  --   HEPARINUNFRC  --  <0.10*  CREATININE 1.71*  --     Estimated Creatinine Clearance: 37.3 mL/min (A) (by C-G formula based on SCr of 1.71 mg/dL (H)).  Assessment: 75 yo male s/p embolization of internal carotid artery aneurysm for heparin Goal of Therapy:  Heparin level 0.1-0.25 units/ml Monitor platelets by anticoagulation protocol: Yes   Plan:  Increase Heparin 800 units/hr  Phillis Knack, PharmD, BCPS

## 2020-05-19 NOTE — Anesthesia Postprocedure Evaluation (Signed)
Anesthesia Post Note  Patient: John Salazar  Procedure(s) Performed: IR WITH ANESTHESIA EMBOLIZATION (N/A )     Patient location during evaluation: PACU Anesthesia Type: General Level of consciousness: awake and alert Pain management: pain level controlled Vital Signs Assessment: post-procedure vital signs reviewed and stable Respiratory status: spontaneous breathing, nonlabored ventilation, respiratory function stable and patient connected to nasal cannula oxygen Cardiovascular status: blood pressure returned to baseline and stable Postop Assessment: no apparent nausea or vomiting Anesthetic complications: no   No complications documented.  Last Vitals:  Vitals:   05/19/20 0800 05/19/20 0900  BP:  132/70  Pulse:  81  Resp: 18 16  Temp: 37.4 C   SpO2:  96%    Last Pain:  Vitals:   05/19/20 0800  TempSrc: Oral  PainSc: 0-No pain                 Effie Berkshire

## 2020-05-19 NOTE — Discharge Instructions (Addendum)
Cerebral Aneurysm  A cerebral aneurysm is a bulge that occurs in a blood vessel (artery) inside the brain. An aneurysm is caused when a weakened part of the blood vessel expands. The blood vessel expands due to the constant pressure from the flow of blood through the weakened blood vessel. As the aneurysm expands, the walls of the aneurysm become weaker. Aneurysms are dangerous because they can leak or burst (rupture). When a cerebral aneurysm ruptures, it causes bleeding in the brain (subarachnoid hemorrhage). The blood flow to the area of the brain supplied by the artery is also reduced. This can cause a stroke, seizures, or a coma. A ruptured cerebral aneurysm is a medical emergency. This can cause permanent brain damage or death. What are the causes? The exact cause of this condition is not known. What increases the risk? The following factors may make you more likely to develop this condition:  Being older. The condition is most common in people between the ages of 33 and 74.  Being male.  Having a family history of aneurysm in two or more direct relatives.  Having certain conditions that are passed along from parent to child (inherited). They include: ? Autosomal dominant polycystic kidney disease. This is a condition in which small, fluid-filled sacs (cysts) develop in the kidney. ? Neurofibromatosis type 1. In this condition, flat spots develop under the skin (pigmentation) and tumors grow along nerves in the skin, brain, and other parts of the body. ? Ehlers-Danlos syndrome. This is a condition in which bad connective tissue causes loose or unstable joints and creates a very soft skin that bruises or tears easily.  Smoking.  Having high blood pressure (hypertension).  Abusing alcohol. What are the signs or symptoms? The symptoms of a cerebral aneurysm that has not leaked or ruptured can depend on its size and rate of growth. A small, unchanging aneurysm generally does not  cause symptoms. A larger aneurysm that is steadily growing can increase pressure on the brain or nerves. This increased pressure can cause:  A headache.  Vision problems.  Numbness or weakness in an arm or leg.  Memory problems.  Problems speaking.  Seizures. If an aneurysm leaks or ruptures, it can cause a life-threatening condition, such as a stroke. Symptoms may include:  A sudden, severe headache with no known cause. The headache is often described as the worst headache ever experienced.  Stiff neck.  Nausea or vomiting, especially when combined with other symptoms, such as a headache.  Sudden weakness or numbness of the face, arm, or leg, especially on one side of the body.  Sudden trouble walking or difficulty moving the arms or legs.  Double vision or sudden trouble seeing in one or both eyes.  Trouble speaking or understanding speech.  Trouble swallowing.  Dizziness.  Loss of balance or coordination.  Intolerance to light.  Sudden confusion or loss of consciousness. How is this diagnosed? This condition is diagnosed using certain tests, including:  CT scan.  Computed tomographic angiogram (CTA). This test uses a dye and a scanner to produce images of your blood vessels.  Magnetic resonance angiogram (MRA). This test uses an MRI machine to produce images of your blood vessels.  Digital subtraction angiogram (DSA). This test involves placing a long, thin tube (catheter) into the artery in your thigh and guiding it up to the arteries in the brain. A dye is then injected into the area, and X-rays are taken to create images of your blood vessels. How  is this treated? Unruptured aneurysm Treatment for an aneurysm that is not causing problems will depend on many factors, such as the size and location of the aneurysm, your age, your overall health, and your preferences. Small aneurysms in certain locations of the brain have a very low chance of bleeding or rupturing.  These small aneurysms may not need to be treated. Your health care provider may monitor the aneurysm regularly to check for any changes. In some cases, however, treatment may be required because of the size or location of an aneurysm. Treatment options may include:  Coiling. During this procedure, a catheter is inserted and advanced through a blood vessel. Once the catheter reaches the aneurysm, tiny coils are used to block blood flow into the aneurysm. This procedure is sometimes done at the same time as a DSA.  Surgical clipping. During surgery, a clip is placed at the base of the aneurysm. The clip prevents blood from continuing to enter the aneurysm.  Flow diversion. This procedure is used to divert blood flow around the aneurysm with a stent that is placed across the opening of an aneurysm. Ruptured aneurysm For a ruptured aneurysm, emergency surgery or coiling is often needed right away to help prevent damage to the brain and to reduce the risk of rebleeding. Follow these instructions at home: If your aneurysm is not treated:  Take over-the-counter and prescription medicines only as told by your health care provider.  Follow a diet suggested by your health care provider. Certain dietary changes may be advised to address hypertension, such as choosing foods that are low in salt (sodium), saturated fat, trans fat, and cholesterol.  Stay physically active. Try to get at least 30 minutes of activity on most or all days of the week.  Do not use any products that contain nicotine or tobacco. These products include cigarettes, chewing tobacco, and vaping devices, such as e-cigarettes. If you need help quitting, ask your health care provider.  If you drink alcohol: ? Limit how much you have to:  0-1 drink a day for women who are not pregnant.  0-2 drinks a day for men. ? Know how much alcohol is in your drink. In the [place] one drink equals one 12 oz bottle of beer (355 mL), one 5 oz glass of  wine (148 mL), or one 1 oz glass of hard liquor (44 mL).  Do not use drugs. If you need help quitting, ask your health care provider.  Keep all follow-up visits. This is important. This includes any referrals, imaging studies, and lab tests. Proper follow-up may prevent an aneurysm rupture or a stroke. Get help right away if:  You have a sudden, severe headache with no known cause. This may include a stiff neck.  You have sudden nausea or vomiting with a severe headache.  You have a seizure.  You have other symptoms of a stroke. "BE FAST" is an easy way to remember the main warning signs of a stroke: ? B - Balance. Signs are dizziness, sudden trouble walking, or loss of balance. ? E - Eyes. Signs are trouble seeing or a sudden change in vision. ? F - Face. Signs are sudden weakness or numbness of the face, or the face or eyelid drooping on one side. ? A - Arms. Signs are weakness or numbness in an arm. This happens suddenly and usually on one side of the body. ? S - Speech. Signs are sudden trouble speaking, slurred speech, or trouble understanding what people  say. ? T - Time. Time to call emergency services. Write down what time symptoms started. These symptoms may represent a serious problem that is an emergency. Do not wait to see if the symptoms will go away. Get medical help right away. Call your local emergency services (911 in the [place] Do not drive yourself to the hospital. Summary  A cerebral aneurysm is a bulge that occurs in a blood vessel (artery) inside the brain.  Aneurysms are dangerous because they can leak or burst (rupture). When a cerebral aneurysm ruptures, it causes bleeding in the brain.  Treatment depends on many factors, including the size and location of the aneurysm and whether it is ruptured. A ruptured aneurysm is a medical emergency.  Get help right away if you have symptoms of a stroke. "BE FAST" is an easy way to remember the main warning signs of a  stroke. This information is not intended to replace advice given to you by your health care provider. Make sure you discuss any questions you have with your health care provider. Document Revised: 10/15/2019 Document Reviewed: 10/15/2019 Elsevier Patient Education  [employer].   Endovascular Therapy for Cerebral Aneurysm, Care After The following information offers guidance on how to care for yourself after your procedure. Your health care provider may also give you more specific instructions. If you have problems or questions, contact your health care provider. What can I expect after the procedure? After the procedure, it is common to have:  Pain, tenderness, and swelling around your incision.  Headaches. Follow these instructions at home: Medicines  Take over-the-counter and prescription medicines only as told by your health care provider.  If you are taking blood thinners: ? Talk with your health care provider before you take any medicines that contain aspirin or NSAIDs, such as ibuprofen. These medicines increase your risk for dangerous bleeding. ? Take your medicine exactly as told, at the same time every day. ? Avoid activities that could cause injury or bruising, and follow instructions about how to prevent falls. ? Wear a medical alert bracelet or carry a card that lists what medicines you take.  Ask your health care provider if the medicine prescribed to you requires you to avoid driving or using machinery. Eating and drinking  Drink enough fluid to keep your urine pale yellow.  Eat a healthy diet. This includes plenty of fruits and vegetables, whole grains, low-fat dairy products, and lean protein. Incision care  Follow instructions from your health care provider about how to take care of your incision. Make sure you: ? Wash your hands with soap and water for at least 20 seconds before and after you change your bandage (dressing). If soap and water are not  available, use hand sanitizer. ? Change your dressing as told by your health care provider. ? Leave stitches (sutures), skin glue, or adhesive strips in place. These skin closures may need to stay in place for 2 weeks or longer. If adhesive strip edges start to loosen and curl up, you may trim the loose edges. Do not remove adhesive strips completely unless your health care provider tells you to do that.  Check your incision area every day for signs of infection. Check for: ? Redness, swelling, or pain. ? Fluid or blood. ? Warmth. ? Pus or a bad smell.   Activity  Return to your normal activities as told by your health care provider. Ask your health care provider what activities are safe for you. Most people can  return to normal activities 2-6 weeks after the procedure.  Do not drive until your health care provider approves.  Do not lift anything that is heavier than 10 lb (4.5 kg), or the limit that you are told, until your health care provider says that it is safe.  Exercise regularly, as directed by your health care provider.  Attend rehabilitation therapy as told by your health care provider. This may include: ? Physical and occupational therapy. ? Speech-language therapy. ? Brain exercises. ? Balance exercises. ? Individual or group therapy. ? Education about your condition and treatment.   General instructions  Do not use any products that contain nicotine or tobacco. These products include cigarettes, chewing tobacco, and vaping devices, such as e-cigarettes. If you need help quitting, ask your health care provider.  Do not take baths, swim, or use a hot tub until your health care provider approves. Ask your health care provider if you may take showers. You may only be allowed to take sponge baths.  Manage your stress. If you need help with this, talk with your health care provider.  Wear compression stockings as told by your health care provider. These stockings help to  prevent blood clots and reduce swelling in your legs.  Keep all follow-up visits. This is important. Contact a health care provider if:  You have redness, swelling, or pain around your incision.  You have fluid or blood coming from your incision.  Your incision feels warm to the touch.  You have pus or a bad smell coming from your incision.  You have a fever. Get help right away if:  You have: ? Stiffness in your neck. ? Pain, numbness, weakness, or swelling in your legs. ? Severe chest pain. ? Difficulty breathing. ? Confusion.  You have any symptoms of a stroke. "BE FAST" is an easy way to remember the main warning signs of a stroke: ? B - Balance. Signs are dizziness, sudden trouble walking, or loss of balance. ? E - Eyes. Signs are trouble seeing or a sudden change in vision. ? F - Face. Signs are sudden weakness or numbness of the face, or the face or eyelid drooping on one side. ? A - Arms. Signs are weakness or numbness in an arm. This happens suddenly and usually on one side of the body. ? S - Speech. Signs are sudden trouble speaking, slurred speech, or trouble understanding what people say. ? T - Time. Time to call emergency services. Write down what time symptoms started.  You have other signs of a stroke, such as: ? A sudden, severe headache with no known cause. ? Nausea or vomiting. ? A seizure. These symptoms may represent a serious problem that is an emergency. Do not wait to see if the symptoms will go away. Get medical help right away. Call your local emergency services (911 in the [place] Do not drive yourself to the hospital.   Summary  After this procedure, it is common to have some pain and swelling around your incision.  Follow instructions from your health care provider about how to take care of your incision. Check for signs of infection every day.  Most people can return to normal activities 2-6 weeks after the procedure. Ask your health care provider  what activities are safe for you during recovery.  Do not drive until your health care provider approves.  Do not smoke or use any products that contain nicotine or tobacco. If you need help quitting, ask your health  care provider. This information is not intended to replace advice given to you by your health care provider. Make sure you discuss any questions you have with your health care provider. Document Revised: 10/15/2019 Document Reviewed: 10/15/2019 Elsevier Patient Education  2021 [employer].   Pseudoaneurysm  An aneurysm is a bulge in an artery. A pseudoaneurysm happens when an artery is injured and blood leaks out and forms a sac-like bulge in the surrounding tissues. What are the causes? The most common cause of this condition is a procedure called an angiogram. During this procedure, a small, thin tube (catheter) is inserted into an artery. After an angiogram, the insertion site on the artery should close back up all the way. If it does not, blood may leak out of the artery. Other causes of a pseudoaneurysm include:  Trauma to the walls of an artery, such as from a stabbing injury or a deep cut.  Bypass artery grafting surgery, which is a type of surgery that makes blood flow to the heart better.  An infection that affects the walls of an artery.  A heart attack (myocardial infarction). What are the signs or symptoms? Symptoms of this condition include:  Pain, soreness, or tenderness at the site of the pseudoaneurysm.  Swelling.  Bruising or a change in skin color.  A throbbing mass or lump at the site. How is this diagnosed? This condition may be diagnosed based on:  Your symptoms.  A physical exam.  An imaging test called a Doppler ultrasound. This imaging test uses sound waves to show the blood flow in the arteries and the pseudoaneurysm. How is this treated? This condition may go away on its own without treatment. To help prevent bleeding that cannot be  controlled, or to help prevent other problems, your health care provider may suggest one of these treatments:  Injecting a blood-clotting enzyme, such as thrombin, into the site.  Fixing the artery with surgery.  Putting pressure (compression) on the pseudoaneurysm. Follow these instructions at home:  Take over-the-counter and prescription medicines only as told by your health care provider.  Return to your normal activities as told by your health care provider. Ask your health care provider what activities are safe for you.  Keep all follow-up visits as told by your health care provider. This is important. Contact a health care provider if:  Your pain, soreness, or tenderness at the pseudoaneurysm site keeps getting worse.  You have swelling at the site. Get help right away if:  You have severe or ongoing (persistent) pain at the site of the pseudoaneurysm.  There is bleeding or drainage from the site.  The part of your body where the pseudoaneurysm is located changes color or becomes painful, cold, or numb.  You have chest pain or shortness of breath.  You feel like you might faint or you faint. Summary  A pseudoaneurysm happens when an artery is injured and blood leaks out to form a sac-like bulge.  The most common cause of this condition is a procedure called an angiogram in which a thin tube (catheter) is inserted into an artery.  This condition may go away on its own without treatment.  Take over-the-counter and prescription medicines only as told by your health care provider.  Get help right away if the part of your body where the pseudoaneurysm is located changes color or becomes painful, cold, or numb. This information is not intended to replace advice given to you by your health care provider. Make  sure you discuss any questions you have with your health care provider. Document Revised: 11/01/2017 Document Reviewed: 11/01/2017 Elsevier Patient Education  2021  [place] This sheet gives you information about how to care for yourself after your procedure. Your health care provider may also give you more specific instructions. If you have problems or questions, contact your health care provider. What can I expect after the procedure? After the procedure, it is common to have:  Bruising that usually fades within 1-2 weeks.  Tenderness at the site. Follow these instructions at home: Wound care 1. Follow instructions from your health care provider about how to take care of your insertion site. Make sure you: ? Wash your hands with soap and water before you change your bandage (dressing). If soap and water are not available, use hand sanitizer. ? Change your dressing as directed- pressure dressing removed 24 hours post-procedure (and switch for bandaid), bandaid removed 72 hours post-procedure 2. Do not take baths, swim, or use a hot tub for 7 days post-procedure. 3. You may shower 48 hours after the procedure or as told by your health care provider. ? Gently wash the site with plain soap and water. ? Pat the area dry with a clean towel. ? Do not rub the site. This may cause bleeding. 4. Check your site every day for signs of infection. Check for: ? Redness, swelling, or pain. ? Fluid or blood. ? Warmth. ? Pus or a bad smell. Activity  Do not stoop, bend, or lift anything that is heavier than 10 lb (4.5 kg) for 2 weeks post-procedure.  Do not drive self for 2 weeks post-procedure. Contact a health care provider if you have:  A fever or chills.  You have redness, swelling, or pain around your insertion site. Get help right away if:  The catheter insertion area swells very fast.  You pass out.  You suddenly start to sweat or your skin gets clammy.  The catheter insertion area is bleeding, and the bleeding does not stop when you hold steady pressure on the area.  The area near or just beyond the catheter insertion  site becomes pale, cool, tingly, or numb. These symptoms may represent a serious problem that is an emergency. Do not wait to see if the symptoms will go away. Get medical help right away. Call your local emergency services (911 in the [place] Do not drive yourself to the hospital.  This information is not intended to replace advice given to you by your health care provider. Make sure you discuss any questions you have with your health care provider. Document Revised: 02/06/2017 Document Reviewed: 02/06/2017 Elsevier Patient Education  2020 [employer].

## 2020-05-20 ENCOUNTER — Telehealth (HOSPITAL_COMMUNITY): Payer: Self-pay

## 2020-05-20 HISTORY — PX: IR ANGIOGRAM FOLLOW UP STUDY: IMG697

## 2020-05-20 HISTORY — PX: IR US GUIDE VASC ACCESS RIGHT: IMG2390

## 2020-05-20 HISTORY — PX: IR ANGIO INTRA EXTRACRAN SEL INTERNAL CAROTID UNI L MOD SED: IMG5361

## 2020-05-20 NOTE — Telephone Encounter (Signed)
Called to schedule f/u, no answer, left vm. AW 

## 2020-05-21 ENCOUNTER — Encounter: Payer: PPO | Admitting: Rehabilitative and Restorative Service Providers"

## 2020-05-22 DIAGNOSIS — I10 Essential (primary) hypertension: Secondary | ICD-10-CM

## 2020-05-22 MED ORDER — AMLODIPINE BESYLATE 2.5 MG PO TABS: 2.5000 mg | ORAL_TABLET | Freq: Every day | ORAL | 3 refills | Status: DC

## 2020-05-25 ENCOUNTER — Telehealth: Payer: Self-pay | Admitting: Pharmacist

## 2020-05-25 NOTE — Telephone Encounter (Signed)
Called pt to discuss MyChart message from this morning. Will increase amlodipine to 5mg  daily and continue losartan 100mg  daily. Pt had been taking both meds in the AM, would report higher readings and headache in the mornings as well - advised him to move 1 med to evening dosing to help provide more consistent 24 hour BP lowering. Discussed that it can take at least a week to see full effects of change in BP meds - pt will continue to monitor BP readings at home and I'll follow up Thursday morning to see how his readings are looking. Can further increase amlodipine if needed, and will plan to send in new rx once maintenance dose of amlodipine is determined. He's aware to monitor for any lower extremity swelling on higher dose of amlodipine. Pt and wife were appreciative for phone call.

## 2020-05-26 ENCOUNTER — Encounter: Payer: PPO | Admitting: Physical Therapy

## 2020-05-28 MED ORDER — AMLODIPINE BESYLATE 10 MG PO TABS: 10.0000 mg | ORAL_TABLET | Freq: Every day | ORAL | 5 refills | Status: DC

## 2020-05-28 NOTE — Telephone Encounter (Signed)
Called pt to follow up with BP readings since increasing amlodipine to 5mg  daily. Home readings have been: 154/90, 153/97, 168/89, 131/75. Still having headaches if SBP is > 150, resolved when he had the reading of 131/75. Tolerating meds well, denies LEE. He has actually been taking 2.5 of his amlodipine (6.25mg  daily). Will further increase amlodipine to 10mg  daily and continue losartan 100mg  daily. He is separating his meds and taking 1 in the AM and 1 in the PM.  Will call pt on Monday for close follow up with his BP. He's aware to look out for LEE as well as signs of low BP especially given quicker med titration due to concerns over HTN given recent brain aneurysm surgery.

## 2020-05-28 NOTE — Addendum Note (Signed)
Addended by: Anniah Glick E on: 05/28/2020 08:45 AM   Modules accepted: Orders

## 2020-05-29 ENCOUNTER — Encounter: Payer: PPO | Admitting: Rehabilitative and Restorative Service Providers"

## 2020-06-01 NOTE — Telephone Encounter (Signed)
Called pt to follow up with BP readings. 4/21: 99/58 last Thursday after we spoke, 147/81 later that night 4/22: 135/78 6am before meds, 97/57 1pm, 133/74 6pm, 140/85 9pm. Almost no headache 4/23: 135/75 6am before meds, 134/76 6pm then took meds, 123/70 8pm. Almost no headache 4/24: 132/78 6pm, 140/77 8pm. Light headache 4/25: 130/77 this AM before meds. Headache  He's taking his losartan 100mg  at 7am, and amlodipine 10mg  at 7pm. Has only been on higher dose of amlodipine since last Thursday. Pt denies dizziness/lightheadness even with 2 low BP readings he had. He did notice headache on higher dose of Imdur (increased last fall), but this improved with time. He noticed it did improve his ability to walk chest-pain free. Will continue current meds for now and call pt in 1 more week.

## 2020-06-02 ENCOUNTER — Ambulatory Visit (HOSPITAL_COMMUNITY)
Admission: RE | Admit: 2020-06-02 | Discharge: 2020-06-02 | Disposition: A | Discharge status: Home / Self Care | Payer: PPO | Source: Ambulatory Visit | Attending: Student | Admitting: Student

## 2020-06-02 ENCOUNTER — Encounter: Payer: PPO | Admitting: Physical Therapy

## 2020-06-02 ENCOUNTER — Other Ambulatory Visit: Payer: Self-pay

## 2020-06-02 DIAGNOSIS — I671 Cerebral aneurysm, nonruptured: Secondary | ICD-10-CM | POA: Diagnosis not present

## 2020-06-03 HISTORY — PX: IR RADIOLOGIST EVAL & MGMT: IMG5224

## 2020-06-05 ENCOUNTER — Encounter: Payer: PPO | Admitting: Rehabilitative and Restorative Service Providers"

## 2020-06-08 ENCOUNTER — Telehealth: Payer: Self-pay | Admitting: Pharmacist

## 2020-06-08 NOTE — Telephone Encounter (Signed)
Pt returned call to clinic. Takes losartan 7am and amlodipine 10mg  at 7pm. Reports home BP readings as follows: 4/25 - 135/82 7am 4/27 - 148/84 7pm 4/28 - 118/74 4am 4/29 - 119/65 noon, 143/79 4pm 4/30 - 104/66 noon, 138/78 7pm  5/1 - 145/85 6am, 118/66 noon, 131/76 evening only took 5mg  of amlodipine 5/2 - 130/77 5am, 86/59 9am. No dizziness. Rechecked while on the phone with pt (10:20am) - 108/67.  Pt also states he had a follow up visit with Dr Estanislado Pandy who advised him that headaches are normal for a few weeks after his surgery and are likely coming from that and not his BP. Pt reports headaches have continued to improve.  Will continue current meds. Advised pt that if he notices a trend of SBP < 100 more regularly, can decrease his amlodipine dose, but since this occurred only 1x, pt felt fine, and on recheck his BP was normal, feel comfortable with him continuing his current meds. Also advised him if he notices low BP out of the ordinary to recheck it 5-10 mins later to make sure the reading isn't a cuff error. Pt will call clinic in a few weeks with update on readings.

## 2020-06-08 NOTE — Telephone Encounter (Signed)
Called pt and left message to discuss home BP readings. Pt has now been on higher dose of amlodipine 10mg  daily since 4/21, has continued on losartan 100mg  daily. Hopefully will not need to make any med changes and can cancel upcoming PharmD visit. Otherwise can consider addition of chlorthalidone 12.5mg  daily if needed for additional BP control.

## 2020-06-12 ENCOUNTER — Other Ambulatory Visit (HOSPITAL_COMMUNITY): Payer: Self-pay | Admitting: Interventional Radiology

## 2020-06-12 ENCOUNTER — Ambulatory Visit: Payer: PPO

## 2020-06-12 DIAGNOSIS — I771 Stricture of artery: Secondary | ICD-10-CM

## 2020-06-25 ENCOUNTER — Other Ambulatory Visit: Payer: Self-pay | Admitting: Radiology

## 2020-06-25 ENCOUNTER — Encounter (HOSPITAL_COMMUNITY): Payer: Self-pay | Admitting: Interventional Radiology

## 2020-06-25 NOTE — Progress Notes (Addendum)
EKG: 05/04/20 CXR: 05/04/20 ECHO: denies Stress Test: denies Cardiac Cath: 11/15/18  Fasting Blood Sugar- na Checks Blood Sugar_na__ times a day  OSA/CPAP: No  ASA/Plavix:  Patient will call office to confirm he should continue  Covid test 06/26/20  Anesthesia Review: Yes, cardiac history  Patient denies shortness of breath, fever, cough, and chest pain at PAT appointment.  Patient verbalized understanding of instructions provided today at the PAT appointment.  Patient asked to review instructions at home and day of surgery.

## 2020-06-26 ENCOUNTER — Other Ambulatory Visit: Payer: Self-pay | Admitting: Student

## 2020-06-26 ENCOUNTER — Other Ambulatory Visit (HOSPITAL_COMMUNITY)
Admission: RE | Admit: 2020-06-26 | Discharge: 2020-06-26 | Disposition: A | Discharge status: Home / Self Care | Payer: PPO | Source: Ambulatory Visit | Attending: Interventional Radiology | Admitting: Interventional Radiology

## 2020-06-26 DIAGNOSIS — I129 Hypertensive chronic kidney disease with stage 1 through stage 4 chronic kidney disease, or unspecified chronic kidney disease: Secondary | ICD-10-CM | POA: Diagnosis present

## 2020-06-26 DIAGNOSIS — E7800 Pure hypercholesterolemia, unspecified: Secondary | ICD-10-CM | POA: Diagnosis present

## 2020-06-26 DIAGNOSIS — Z7902 Long term (current) use of antithrombotics/antiplatelets: Secondary | ICD-10-CM | POA: Diagnosis not present

## 2020-06-26 DIAGNOSIS — Z01812 Encounter for preprocedural laboratory examination: Secondary | ICD-10-CM | POA: Insufficient documentation

## 2020-06-26 DIAGNOSIS — N182 Chronic kidney disease, stage 2 (mild): Secondary | ICD-10-CM | POA: Diagnosis present

## 2020-06-26 DIAGNOSIS — Z20822 Contact with and (suspected) exposure to covid-19: Secondary | ICD-10-CM | POA: Diagnosis present

## 2020-06-26 DIAGNOSIS — Z823 Family history of stroke: Secondary | ICD-10-CM | POA: Diagnosis not present

## 2020-06-26 DIAGNOSIS — I729 Aneurysm of unspecified site: Secondary | ICD-10-CM | POA: Diagnosis not present

## 2020-06-26 DIAGNOSIS — I2510 Atherosclerotic heart disease of native coronary artery without angina pectoris: Secondary | ICD-10-CM | POA: Diagnosis present

## 2020-06-26 DIAGNOSIS — Z79899 Other long term (current) drug therapy: Secondary | ICD-10-CM | POA: Diagnosis not present

## 2020-06-26 DIAGNOSIS — Z87891 Personal history of nicotine dependence: Secondary | ICD-10-CM | POA: Diagnosis not present

## 2020-06-26 DIAGNOSIS — I63232 Cerebral infarction due to unspecified occlusion or stenosis of left carotid arteries: Secondary | ICD-10-CM | POA: Diagnosis not present

## 2020-06-26 DIAGNOSIS — K219 Gastro-esophageal reflux disease without esophagitis: Secondary | ICD-10-CM | POA: Diagnosis present

## 2020-06-26 DIAGNOSIS — Z7982 Long term (current) use of aspirin: Secondary | ICD-10-CM | POA: Diagnosis not present

## 2020-06-26 DIAGNOSIS — Z9889 Other specified postprocedural states: Secondary | ICD-10-CM | POA: Diagnosis not present

## 2020-06-26 DIAGNOSIS — N400 Enlarged prostate without lower urinary tract symptoms: Secondary | ICD-10-CM | POA: Diagnosis present

## 2020-06-26 DIAGNOSIS — N183 Chronic kidney disease, stage 3 unspecified: Secondary | ICD-10-CM | POA: Diagnosis not present

## 2020-06-26 DIAGNOSIS — I671 Cerebral aneurysm, nonruptured: Secondary | ICD-10-CM | POA: Diagnosis present

## 2020-06-26 DIAGNOSIS — I6522 Occlusion and stenosis of left carotid artery: Secondary | ICD-10-CM | POA: Diagnosis present

## 2020-06-26 LAB — CBC WITH DIFFERENTIAL/PLATELET
Abs Immature Granulocytes: 0.02 10*3/uL (ref 0.00–0.07)
Basophils Absolute: 0 10*3/uL (ref 0.0–0.1)
Basophils Relative: 1 %
Eosinophils Absolute: 0.2 10*3/uL (ref 0.0–0.5)
Eosinophils Relative: 4 %
HCT: 40.8 % (ref 39.0–52.0)
Hemoglobin: 13.5 g/dL (ref 13.0–17.0)
Immature Granulocytes: 0 %
Lymphocytes Relative: 24 %
Lymphs Abs: 1.2 10*3/uL (ref 0.7–4.0)
MCH: 30.3 pg (ref 26.0–34.0)
MCHC: 33.1 g/dL (ref 30.0–36.0)
MCV: 91.7 fL (ref 80.0–100.0)
Monocytes Absolute: 0.5 10*3/uL (ref 0.1–1.0)
Monocytes Relative: 10 %
Neutro Abs: 3 10*3/uL (ref 1.7–7.7)
Neutrophils Relative %: 61 %
Platelets: 165 10*3/uL (ref 150–400)
RBC: 4.45 MIL/uL (ref 4.22–5.81)
RDW: 12.7 % (ref 11.5–15.5)
WBC: 4.9 10*3/uL (ref 4.0–10.5)
nRBC: 0 % (ref 0.0–0.2)

## 2020-06-26 LAB — BASIC METABOLIC PANEL
Anion gap: 3 — ABNORMAL LOW (ref 5–15)
BUN: 17 mg/dL (ref 8–23)
CO2: 27 mmol/L (ref 22–32)
Calcium: 9.5 mg/dL (ref 8.9–10.3)
Chloride: 107 mmol/L (ref 98–111)
Creatinine, Ser: 1.62 mg/dL — ABNORMAL HIGH (ref 0.61–1.24)
GFR, Estimated: 44 mL/min — ABNORMAL LOW (ref >60)
Glucose, Bld: 97 mg/dL (ref 70–99)
Potassium: 4.3 mmol/L (ref 3.5–5.1)
Sodium: 137 mmol/L (ref 135–145)

## 2020-06-26 LAB — PROTIME-INR
INR: 1 (ref 0.8–1.2)
Prothrombin Time: 13.6 seconds (ref 11.4–15.2)

## 2020-06-26 LAB — SARS CORONAVIRUS 2 (TAT 6-24 HRS): SARS Coronavirus 2: NEGATIVE

## 2020-06-26 LAB — PLATELET INHIBITION P2Y12: Platelet Function  P2Y12: 100 [PRU] — ABNORMAL LOW (ref 182–335)

## 2020-06-26 NOTE — Progress Notes (Signed)
Anesthesia Chart Review:  Pertinent history includes HTN, GERD, CAD (Cath 11/2018 showed mod dz and one tight OM lesion - plan was to continue medical Rx), EtOH abuse and CV dz (s/p PTA/ stent 2019).   Last seen by cardiologist Dr. Harrington Challenger 05/11/20. Discussed recent ED visit. Per note, "CAD Recent episode of CP with ED visity   I am not sure what this was caused by   BP was up at time   I am not convinced it was angina  He has not had any since   I would follow for now." Also discussed upcoming embolization of aneurysm (which was performed 05/18/20).  BMP and CBC 06/26/20 reviewed, creatinine elevated 1.62 consistent with hx of CKD.  Will need DOS eval.   EKG 05/04/20: sinus rhythm with artifact. Rate 76. Ventricular premature complex. Abnormal R-wave progression, early transition  CTA chest abdomen pelvis 05/04/20: IMPRESSION: CTA of the chest: No aortic injury is identified. No dissection is seen.  No definitive pulmonary emboli.  No other focal abnormality is seen.  Aortic Atherosclerosis (ICD10-I70.0) and Emphysema (ICD10-J43.9).  CTA of the abdomen and pelvis: Scattered hypodensities within the liver and kidney consistent with cysts.  Diverticulosis without diverticulitis.  No aortic abnormality is noted. Mild narrowing of the right renal artery and SMA is seen  LHC 11/15/18: Conclusions: 1. Severe single-vessel coronary artery disease involving a branch of OM1 (80% ostial stenosis) and distal LCx/OM2 (70%).  The affected vessels/branches are small and not well-suited to PCI (vessel size 2 mm or less). 2. Mild to moderate, non-obstructive coronary artery disease involving the LMCA, LAD, and RCA. 3. Normal left ventricular filling pressure.  Recommendations: 1. Aggressive medical therapy and secondary prevention.  I will add isosorbide mononitrate 15 mg daily, to be escalated as tolerated. 2. Continue antiplatelet and statin therapy. 3. Close outpatient follow-up with Dr.  Elon Jester.  Carotid Duplex 10/04/18: Summary:  Right Carotid: Right ICA stent is patent with less than 50% stenosis.   Left Carotid: Velocities in the left ICA are consistent with a 1-39%  stenosis.   Vertebrals: Bilateral vertebral arteries demonstrate antegrade flow.     Wynonia Musty [facility] Short Stay Center/Anesthesiology Phone [phone] 06/26/2020 4:23 PM

## 2020-06-26 NOTE — Anesthesia Preprocedure Evaluation (Addendum)
Anesthesia Evaluation  Patient identified by MRN, date of birth, ID band Patient awake    Reviewed: Allergy & Precautions, NPO status , Patient's Chart, lab work & pertinent test results  Airway Mallampati: III  TM Distance: >3 FB Neck ROM: Full    Dental  (+) Missing, Dental Advisory Given,    Pulmonary neg pulmonary ROS, former smoker,    Pulmonary exam normal breath sounds clear to auscultation       Cardiovascular hypertension, Pt. on medications + angina + CAD, + Cardiac Stents (2019) and + Peripheral Vascular Disease  Normal cardiovascular exam Rhythm:Regular Rate:Normal  LHC 11/15/18: Conclusions: 1. Severe single-vessel coronary artery disease involving a branch of OM1 (80% ostial stenosis) and distal LCx/OM2 (70%). The affected vessels/branches are small and not well-suited to PCI (vessel size 2 mm or less). 2. Mild to moderate, non-obstructive coronary artery disease involving the LMCA, LAD, and RCA. 3. Normal left ventricular filling pressure.  Recommendations: 1. Aggressive medical therapy and secondary prevention. I will add isosorbide mononitrate 15 mg daily, to be escalated as tolerated. 2. Continue antiplatelet and statin therapy. 3. Close outpatient follow-up with Dr. Elon Jester.  Carotid Duplex 10/04/18: Summary:  Right Carotid: Right ICA stent is patent with less than 50% stenosis.   Left Carotid: Velocities in the left ICA are consistent with a 1-39%  stenosis.   Vertebrals: Bilateral vertebral arteries demonstrate antegrade flow   Neuro/Psych PSYCHIATRIC DISORDERS Anxiety Depression 05/18/2020 S/P embolization of wide neck Lt ICA Aneurysm negative neurological ROS     GI/Hepatic GERD  Medicated and Controlled,(+)     substance abuse  alcohol use,   Endo/Other  negative endocrine ROS  Renal/GU Renal InsufficiencyRenal disease (Cr 1.62, K 4.3)  negative genitourinary   Musculoskeletal negative  musculoskeletal ROS (+)   Abdominal   Peds  Hematology  (+) Blood dyscrasia (on plavix), ,   Anesthesia Other Findings   Reproductive/Obstetrics                           Anesthesia Physical Anesthesia Plan  ASA: III  Anesthesia Plan: General   Post-op Pain Management:    Induction: Intravenous  PONV Risk Score and Plan: 2 and Dexamethasone and Ondansetron  Airway Management Planned: Oral ETT  Additional Equipment: Arterial line  Intra-op Plan:   Post-operative Plan: Extubation in OR  Informed Consent: I have reviewed the patients History and Physical, chart, labs and discussed the procedure including the risks, benefits and alternatives for the proposed anesthesia with the patient or authorized representative who has indicated his/her understanding and acceptance.     Dental advisory given  Plan Discussed with: CRNA  Anesthesia Plan Comments:        Anesthesia Quick Evaluation

## 2020-06-29 ENCOUNTER — Other Ambulatory Visit: Payer: Self-pay

## 2020-06-29 ENCOUNTER — Inpatient Hospital Stay (HOSPITAL_COMMUNITY)
Admission: RE | Admit: 2020-06-29 | Discharge: 2020-06-30 | DRG: 027 | Disposition: A | Discharge status: Home / Self Care | Payer: PPO | Attending: Interventional Radiology | Admitting: Interventional Radiology

## 2020-06-29 ENCOUNTER — Encounter (HOSPITAL_COMMUNITY)
Admission: RE | Disposition: A | Discharge status: Home / Self Care | Payer: Self-pay | Source: Home / Self Care | Attending: Interventional Radiology

## 2020-06-29 ENCOUNTER — Inpatient Hospital Stay (HOSPITAL_COMMUNITY): Payer: PPO | Admitting: Physician Assistant

## 2020-06-29 ENCOUNTER — Encounter (HOSPITAL_COMMUNITY): Payer: Self-pay | Admitting: Interventional Radiology

## 2020-06-29 ENCOUNTER — Inpatient Hospital Stay (HOSPITAL_COMMUNITY)
Admission: RE | Admit: 2020-06-29 | Discharge: 2020-06-29 | Disposition: A | Discharge status: Home / Self Care | Payer: PPO | Source: Ambulatory Visit | Attending: Interventional Radiology | Admitting: Interventional Radiology

## 2020-06-29 DIAGNOSIS — Z7902 Long term (current) use of antithrombotics/antiplatelets: Secondary | ICD-10-CM | POA: Diagnosis not present

## 2020-06-29 DIAGNOSIS — I2510 Atherosclerotic heart disease of native coronary artery without angina pectoris: Secondary | ICD-10-CM | POA: Diagnosis present

## 2020-06-29 DIAGNOSIS — Z79899 Other long term (current) drug therapy: Secondary | ICD-10-CM

## 2020-06-29 DIAGNOSIS — E7800 Pure hypercholesterolemia, unspecified: Secondary | ICD-10-CM | POA: Diagnosis present

## 2020-06-29 DIAGNOSIS — K219 Gastro-esophageal reflux disease without esophagitis: Secondary | ICD-10-CM | POA: Diagnosis present

## 2020-06-29 DIAGNOSIS — Z20822 Contact with and (suspected) exposure to covid-19: Secondary | ICD-10-CM | POA: Diagnosis present

## 2020-06-29 DIAGNOSIS — Z87891 Personal history of nicotine dependence: Secondary | ICD-10-CM

## 2020-06-29 DIAGNOSIS — I6521 Occlusion and stenosis of right carotid artery: Secondary | ICD-10-CM

## 2020-06-29 DIAGNOSIS — I6522 Occlusion and stenosis of left carotid artery: Principal | ICD-10-CM | POA: Diagnosis present

## 2020-06-29 DIAGNOSIS — I129 Hypertensive chronic kidney disease with stage 1 through stage 4 chronic kidney disease, or unspecified chronic kidney disease: Secondary | ICD-10-CM | POA: Diagnosis present

## 2020-06-29 DIAGNOSIS — I671 Cerebral aneurysm, nonruptured: Secondary | ICD-10-CM | POA: Diagnosis present

## 2020-06-29 DIAGNOSIS — Z7982 Long term (current) use of aspirin: Secondary | ICD-10-CM | POA: Diagnosis not present

## 2020-06-29 DIAGNOSIS — N182 Chronic kidney disease, stage 2 (mild): Secondary | ICD-10-CM | POA: Diagnosis present

## 2020-06-29 DIAGNOSIS — I771 Stricture of artery: Secondary | ICD-10-CM

## 2020-06-29 DIAGNOSIS — Z823 Family history of stroke: Secondary | ICD-10-CM

## 2020-06-29 DIAGNOSIS — N400 Enlarged prostate without lower urinary tract symptoms: Secondary | ICD-10-CM | POA: Diagnosis present

## 2020-06-29 HISTORY — PX: IR ANGIO INTRA EXTRACRAN SEL COM CAROTID INNOMINATE UNI L MOD SED: IMG5358

## 2020-06-29 HISTORY — PX: RADIOLOGY WITH ANESTHESIA: SHX6223

## 2020-06-29 HISTORY — DX: Occlusion and stenosis of right carotid artery: I65.21

## 2020-06-29 HISTORY — PX: IR INTRAVSC STENT CERV CAROTID W/EMB-PROT MOD SED INCL ANGIO: IMG2303

## 2020-06-29 HISTORY — PX: IR US GUIDE VASC ACCESS RIGHT: IMG2390

## 2020-06-29 LAB — MRSA PCR SCREENING: MRSA by PCR: NEGATIVE

## 2020-06-29 LAB — POCT ACTIVATED CLOTTING TIME
Activated Clotting Time: 202 seconds
Activated Clotting Time: 220 seconds

## 2020-06-29 LAB — HEPARIN LEVEL (UNFRACTIONATED): Heparin Unfractionated: <0.1 IU/mL — ABNORMAL LOW (ref 0.30–0.70)

## 2020-06-29 LAB — PLATELET INHIBITION P2Y12: Platelet Function  P2Y12: 136 [PRU] — ABNORMAL LOW (ref 182–335)

## 2020-06-29 SURGERY — RADIOLOGY WITH ANESTHESIA
Anesthesia: General | Laterality: Left

## 2020-06-29 MED ORDER — ASPIRIN 81 MG PO CHEW
81.0000 mg | CHEWABLE_TABLET | Freq: Every day | ORAL | Status: DC
  Administered 2020-06-30: 81 mg via ORAL
  Filled 2020-06-29: qty 1

## 2020-06-29 MED ORDER — ROSUVASTATIN CALCIUM 20 MG PO TABS
40.0000 mg | ORAL_TABLET | Freq: Every day | ORAL | Status: DC
  Administered 2020-06-29: 40 mg via ORAL
  Filled 2020-06-29: qty 2

## 2020-06-29 MED ORDER — CHLORHEXIDINE GLUCONATE 0.12 % MT SOLN
15.0000 mL | Freq: Once | OROMUCOSAL | Status: AC
  Administered 2020-06-29: 15 mL via OROMUCOSAL
  Filled 2020-06-29: qty 15

## 2020-06-29 MED ORDER — DEXAMETHASONE SODIUM PHOSPHATE 10 MG/ML IJ SOLN
INTRAMUSCULAR | Status: DC | PRN
  Administered 2020-06-29: 4 mg via INTRAVENOUS

## 2020-06-29 MED ORDER — NITROGLYCERIN 1 MG/10 ML FOR IR/CATH LAB
INTRA_ARTERIAL | Status: AC
  Filled 2020-06-29: qty 10

## 2020-06-29 MED ORDER — NIMODIPINE 30 MG PO CAPS
0.0000 mg | ORAL_CAPSULE | ORAL | Status: DC
  Filled 2020-06-29: qty 2
  Filled 2020-06-29: qty 1

## 2020-06-29 MED ORDER — ORAL CARE MOUTH RINSE: 15.0000 mL | Freq: Once | OROMUCOSAL | Status: AC

## 2020-06-29 MED ORDER — CLEVIDIPINE BUTYRATE 0.5 MG/ML IV EMUL
0.0000 mg/h | INTRAVENOUS | Status: DC
  Administered 2020-06-29 (×2): 11 mg/h via INTRAVENOUS
  Administered 2020-06-29: 15 mg/h via INTRAVENOUS
  Administered 2020-06-29: 13 mg/h via INTRAVENOUS
  Administered 2020-06-29 – 2020-06-30 (×3): 11 mg/h via INTRAVENOUS
  Administered 2020-06-30: 12 mg/h via INTRAVENOUS
  Filled 2020-06-29 (×3): qty 50
  Filled 2020-06-29: qty 100
  Filled 2020-06-29 (×2): qty 50
  Filled 2020-06-29: qty 100
  Filled 2020-06-29: qty 50

## 2020-06-29 MED ORDER — SUGAMMADEX SODIUM 200 MG/2ML IV SOLN
INTRAVENOUS | Status: DC | PRN
  Administered 2020-06-29: 200 mg via INTRAVENOUS

## 2020-06-29 MED ORDER — IOHEXOL 300 MG/ML  SOLN: 100.0000 mL | Freq: Once | INTRAMUSCULAR | Status: DC | PRN

## 2020-06-29 MED ORDER — ESCITALOPRAM OXALATE 10 MG PO TABS
10.0000 mg | ORAL_TABLET | Freq: Every day | ORAL | Status: DC
  Administered 2020-06-29: 10 mg via ORAL
  Filled 2020-06-29: qty 1

## 2020-06-29 MED ORDER — ACETAMINOPHEN 325 MG PO TABS
650.0000 mg | ORAL_TABLET | ORAL | Status: DC | PRN
  Administered 2020-06-30: 650 mg via ORAL
  Filled 2020-06-29: qty 2

## 2020-06-29 MED ORDER — VANCOMYCIN HCL IN DEXTROSE 1-5 GM/200ML-% IV SOLN
1000.0000 mg | INTRAVENOUS | Status: AC
  Administered 2020-06-29: 1000 mg via INTRAVENOUS

## 2020-06-29 MED ORDER — HEPARIN (PORCINE) 25000 UT/250ML-% IV SOLN
500.0000 [IU]/h | INTRAVENOUS | Status: DC
  Administered 2020-06-29: 500 [IU]/h via INTRAVENOUS
  Filled 2020-06-29: qty 250

## 2020-06-29 MED ORDER — PHENYLEPHRINE 40 MCG/ML (10ML) SYRINGE FOR IV PUSH (FOR BLOOD PRESSURE SUPPORT)
PREFILLED_SYRINGE | INTRAVENOUS | Status: DC | PRN
  Administered 2020-06-29 (×2): 160 ug via INTRAVENOUS
  Administered 2020-06-29: 80 ug via INTRAVENOUS

## 2020-06-29 MED ORDER — FENTANYL CITRATE (PF) 100 MCG/2ML IJ SOLN: 25.0000 ug | INTRAMUSCULAR | Status: DC | PRN

## 2020-06-29 MED ORDER — EPHEDRINE SULFATE-NACL 50-0.9 MG/10ML-% IV SOSY
PREFILLED_SYRINGE | INTRAVENOUS | Status: DC | PRN
  Administered 2020-06-29 (×2): 10 mg via INTRAVENOUS

## 2020-06-29 MED ORDER — FENTANYL CITRATE (PF) 250 MCG/5ML IJ SOLN
INTRAMUSCULAR | Status: AC
  Filled 2020-06-29: qty 5

## 2020-06-29 MED ORDER — VERAPAMIL HCL 2.5 MG/ML IV SOLN: INTRA_ARTERIAL | Status: AC | PRN

## 2020-06-29 MED ORDER — ASPIRIN EC 325 MG PO TBEC
325.0000 mg | DELAYED_RELEASE_TABLET | ORAL | Status: AC
  Filled 2020-06-29 (×2): qty 1

## 2020-06-29 MED ORDER — ONDANSETRON HCL 4 MG/2ML IJ SOLN
INTRAMUSCULAR | Status: DC | PRN
  Administered 2020-06-29: 4 mg via INTRAVENOUS

## 2020-06-29 MED ORDER — ASPIRIN 81 MG PO CHEW: 81.0000 mg | CHEWABLE_TABLET | Freq: Every day | ORAL | Status: DC

## 2020-06-29 MED ORDER — PROPOFOL 10 MG/ML IV BOLUS
INTRAVENOUS | Status: AC
  Filled 2020-06-29: qty 20

## 2020-06-29 MED ORDER — HEPARIN (PORCINE) 25000 UT/250ML-% IV SOLN
750.0000 [IU]/h | INTRAVENOUS | Status: AC
  Filled 2020-06-29: qty 250

## 2020-06-29 MED ORDER — HEPARIN (PORCINE) 25000 UT/250ML-% IV SOLN
750.0000 [IU]/h | INTRAVENOUS | Status: DC
  Filled 2020-06-29: qty 250

## 2020-06-29 MED ORDER — LOSARTAN POTASSIUM 50 MG PO TABS
100.0000 mg | ORAL_TABLET | Freq: Every day | ORAL | Status: DC
  Administered 2020-06-30: 100 mg via ORAL
  Filled 2020-06-29: qty 2

## 2020-06-29 MED ORDER — EPTIFIBATIDE 20 MG/10ML IV SOLN
INTRAVENOUS | Status: AC
  Filled 2020-06-29: qty 10

## 2020-06-29 MED ORDER — EPTIFIBATIDE 20 MG/10ML IV SOLN
INTRAVENOUS | Status: AC | PRN
  Administered 2020-06-29 (×4): 1.5 mg via INTRAVENOUS

## 2020-06-29 MED ORDER — FENTANYL CITRATE (PF) 250 MCG/5ML IJ SOLN
INTRAMUSCULAR | Status: DC | PRN
  Administered 2020-06-29 (×2): 50 ug via INTRAVENOUS

## 2020-06-29 MED ORDER — HEPARIN SODIUM (PORCINE) 1000 UNIT/ML IJ SOLN
INTRAMUSCULAR | Status: AC
  Filled 2020-06-29: qty 1

## 2020-06-29 MED ORDER — ACETAMINOPHEN 650 MG RE SUPP: 650.0000 mg | RECTAL | Status: DC | PRN

## 2020-06-29 MED ORDER — FENTANYL CITRATE (PF) 100 MCG/2ML IJ SOLN: INTRAMUSCULAR | Status: DC | PRN

## 2020-06-29 MED ORDER — LIDOCAINE HCL (PF) 1 % IJ SOLN
INTRAMUSCULAR | Status: AC
  Filled 2020-06-29: qty 30

## 2020-06-29 MED ORDER — LIDOCAINE HCL (PF) 1 % IJ SOLN
INTRAMUSCULAR | Status: AC | PRN
  Administered 2020-06-29: 2 mL

## 2020-06-29 MED ORDER — CHLORHEXIDINE GLUCONATE CLOTH 2 % EX PADS
6.0000 | MEDICATED_PAD | Freq: Every day | CUTANEOUS | Status: DC
  Administered 2020-06-29: 6 via TOPICAL

## 2020-06-29 MED ORDER — ASPIRIN EC 81 MG PO TBEC
DELAYED_RELEASE_TABLET | ORAL | Status: AC
  Administered 2020-06-29: 243 mg via ORAL
  Filled 2020-06-29: qty 3

## 2020-06-29 MED ORDER — CLOPIDOGREL BISULFATE 75 MG PO TABS
75.0000 mg | ORAL_TABLET | Freq: Every day | ORAL | Status: DC
  Administered 2020-06-30: 75 mg via ORAL
  Filled 2020-06-29: qty 1

## 2020-06-29 MED ORDER — PROPOFOL 10 MG/ML IV BOLUS
INTRAVENOUS | Status: DC | PRN
  Administered 2020-06-29: 90 mg via INTRAVENOUS
  Administered 2020-06-29: 30 mg via INTRAVENOUS

## 2020-06-29 MED ORDER — AMLODIPINE BESYLATE 10 MG PO TABS
10.0000 mg | ORAL_TABLET | Freq: Every day | ORAL | Status: DC
  Filled 2020-06-29: qty 1

## 2020-06-29 MED ORDER — ACETAMINOPHEN 160 MG/5ML PO SOLN: 650.0000 mg | ORAL | Status: DC | PRN

## 2020-06-29 MED ORDER — PANTOPRAZOLE SODIUM 40 MG PO TBEC
40.0000 mg | DELAYED_RELEASE_TABLET | Freq: Every morning | ORAL | Status: DC
  Administered 2020-06-30: 40 mg via ORAL
  Filled 2020-06-29: qty 1

## 2020-06-29 MED ORDER — ISOSORBIDE MONONITRATE ER 30 MG PO TB24
60.0000 mg | ORAL_TABLET | Freq: Every morning | ORAL | Status: DC
  Administered 2020-06-30: 60 mg via ORAL
  Filled 2020-06-29: qty 2

## 2020-06-29 MED ORDER — TAMSULOSIN HCL 0.4 MG PO CAPS
0.4000 mg | ORAL_CAPSULE | Freq: Every day | ORAL | Status: DC
  Administered 2020-06-29: 0.4 mg via ORAL
  Filled 2020-06-29: qty 1

## 2020-06-29 MED ORDER — VANCOMYCIN HCL IN DEXTROSE 1-5 GM/200ML-% IV SOLN
INTRAVENOUS | Status: AC
  Filled 2020-06-29: qty 200

## 2020-06-29 MED ORDER — SODIUM CHLORIDE 0.9 % IV SOLN: INTRAVENOUS | Status: DC

## 2020-06-29 MED ORDER — LIDOCAINE 2% (20 MG/ML) 5 ML SYRINGE
INTRAMUSCULAR | Status: DC | PRN
  Administered 2020-06-29: 60 mg via INTRAVENOUS

## 2020-06-29 MED ORDER — VERAPAMIL HCL 2.5 MG/ML IV SOLN
INTRAVENOUS | Status: AC
  Filled 2020-06-29: qty 2

## 2020-06-29 MED ORDER — CLEVIDIPINE BUTYRATE 0.5 MG/ML IV EMUL
INTRAVENOUS | Status: AC
  Filled 2020-06-29: qty 50

## 2020-06-29 MED ORDER — PHENYLEPHRINE HCL-NACL 10-0.9 MG/250ML-% IV SOLN: INTRAVENOUS | Status: DC | PRN

## 2020-06-29 MED ORDER — LACTATED RINGERS IV SOLN: INTRAVENOUS | Status: DC

## 2020-06-29 MED ORDER — VANCOMYCIN HCL 1000 MG IV SOLR
1000.0000 mg | INTRAVENOUS | Status: DC
  Filled 2020-06-29: qty 1000

## 2020-06-29 MED ORDER — CLOPIDOGREL BISULFATE 75 MG PO TABS: 75.0000 mg | ORAL_TABLET | Freq: Every day | ORAL | Status: DC

## 2020-06-29 MED ORDER — CLEVIDIPINE BUTYRATE 0.5 MG/ML IV EMUL
INTRAVENOUS | Status: DC | PRN
  Administered 2020-06-29: 1 mg/h via INTRAVENOUS

## 2020-06-29 MED ORDER — CLOPIDOGREL BISULFATE 75 MG PO TABS
75.0000 mg | ORAL_TABLET | ORAL | Status: AC
  Administered 2020-06-29: 75 mg via ORAL
  Filled 2020-06-29 (×2): qty 1

## 2020-06-29 MED ORDER — PHENYLEPHRINE HCL-NACL 10-0.9 MG/250ML-% IV SOLN
INTRAVENOUS | Status: DC | PRN
  Administered 2020-06-29: 25 ug/min via INTRAVENOUS

## 2020-06-29 MED ORDER — HEPARIN SODIUM (PORCINE) 1000 UNIT/ML IJ SOLN
INTRAMUSCULAR | Status: DC | PRN
  Administered 2020-06-29 (×2): 1000 [IU] via INTRAVENOUS

## 2020-06-29 MED ORDER — ROCURONIUM BROMIDE 10 MG/ML (PF) SYRINGE
PREFILLED_SYRINGE | INTRAVENOUS | Status: DC | PRN
  Administered 2020-06-29: 70 mg via INTRAVENOUS
  Administered 2020-06-29: 20 mg via INTRAVENOUS

## 2020-06-29 NOTE — Transfer of Care (Signed)
Immediate Anesthesia Transfer of Care Note  Patient: John Salazar  Procedure(s) Performed: RADIOLOGY WITH ANESTHESIA  LEFT CAROTID STENT PLACEMENT (Left )  Patient Location: PACU  Anesthesia Type:General  Level of Consciousness: awake, alert , oriented and patient cooperative  Airway & Oxygen Therapy: Patient Spontanous Breathing and Patient connected to nasal cannula oxygen  Post-op Assessment: Report given to RN, Post -op Vital signs reviewed and stable, Patient moving all extremities X 4 and Patient able to stick tongue midline  Post vital signs: Reviewed and stable  Last Vitals:  Vitals Value Taken Time  BP 105/58 06/29/20 1132  Temp    Pulse 82 06/29/20 1136  Resp 17 06/29/20 1136  SpO2 100 % 06/29/20 1136  Vitals shown include unvalidated device data.  Last Pain:  Vitals:   06/29/20 [id number]  TempSrc:   PainSc: 0-No pain         Complications: No complications documented.

## 2020-06-29 NOTE — Procedures (Signed)
S/P Lt common carotid artrriogram RT Rad approach  S/P placement odf 57mm x 37 mm Wallstent across Lt ICA stenosis  covering the pseudoaneurysms. Patient extubated  Denies any H/As,N/V visual or motor symptoms. Pupils 16mm RT = Lt sluggish Moves all 4s equally.  Distal RT rad pulse present. S.Robyn Galati MD

## 2020-06-29 NOTE — Sedation Documentation (Signed)
Handoff with PACU, RN. Right radial site level 0, pulse +2

## 2020-06-29 NOTE — Progress Notes (Signed)
NIR.  Left ICA stenosis with associated pseudoaneurysms s/p revascularization using stent placement via right radial approach this AM by Dr. Estanislado Pandy.  Patient evaluated bedside in neuro ICU alongside Dr. Estanislado Pandy following procedure. Patient awake and alert sitting in bed with no complaints at this time. Wife at bedside.  Alert, awake, and oriented x3. Speech and comprehension intact. PERRL bilaterally. Can spontaneously move all extremities. No pronator drift. Distal pulses (radial) palpable bilaterally. Right radial puncture site soft with TR band in place, no active bleeding or hematoma.  Plan to stay in neuro ICU for overnight observation. Advance diet as tolerated. TR band deflation per orderset. SBP goal = [id number] systolic. Continue taking Plavix 75 mg once daily and Aspirin 81 mg once daily. Heparin gtt until 0700 tomorrow. NIR to follow.   John Graff Haniah Penny, PA-C 06/29/2020, 3:23 PM

## 2020-06-29 NOTE — Anesthesia Procedure Notes (Signed)
Arterial Line Insertion Start/End5/23/2022 8:20 AM, 06/29/2020 8:25 AM Performed by: Lowella Dell, CRNA, CRNA  Patient location: Pre-op. Preanesthetic checklist: patient identified, IV checked, site marked, risks and benefits discussed, surgical consent, monitors and equipment checked, pre-op evaluation, timeout performed and anesthesia consent Lidocaine 1% used for infiltration and patient sedated Left, radial was placed Catheter size: 20 G Hand hygiene performed  and maximum sterile barriers used   Attempts: 1 Procedure performed without using ultrasound guided technique. Following insertion, dressing applied and Biopatch. Post procedure assessment: normal  Patient tolerated the procedure well with no immediate complications.

## 2020-06-29 NOTE — Anesthesia Procedure Notes (Signed)
Procedure Name: Intubation Date/Time: 06/29/2020 9:03 AM Performed by: Lowella Dell, CRNA Pre-anesthesia Checklist: Patient identified, Emergency Drugs available, Suction available and Patient being monitored Patient Re-evaluated:Patient Re-evaluated prior to induction Oxygen Delivery Method: Circle System Utilized Preoxygenation: Pre-oxygenation with 100% oxygen Induction Type: IV induction Ventilation: Mask ventilation without difficulty and Oral airway inserted - appropriate to patient size Laryngoscope Size: Mac and 4 Grade View: Grade I Tube type: Oral Tube size: 7.5 mm Number of attempts: 1 Airway Equipment and Method: Stylet Placement Confirmation: ETT inserted through vocal cords under direct vision,  positive ETCO2 and breath sounds checked- equal and bilateral Secured at: 23 cm Tube secured with: Tape Dental Injury: Teeth and Oropharynx as per pre-operative assessment

## 2020-06-29 NOTE — Sedation Documentation (Signed)
11 cc air applied to TR band, right radial. Site is level 0 with a strong pulse.

## 2020-06-29 NOTE — Progress Notes (Signed)
ANTICOAGULATION CONSULT NOTE - Initial Consult  Pharmacy Consult for Heparin Indication: carotid stent  Allergies  Allergen Reactions  . Bee Venom Anaphylaxis and Shortness Of Breath  . Penicillins Rash    PATIENT HAS HAD A PCN REACTION WITH IMMEDIATE RASH, FACIAL/TONGUE/THROAT SWELLING, SOB, OR LIGHTHEADEDNESS WITH HYPOTENSION:  #  #  YES  #  #  Has patient had a PCN reaction causing severe rash involving mucus membranes or skin necrosis: no Has patient had a PCN reaction that required hospitalization: no Has patient had a PCN reaction occurring within the last 10 years: no     Patient Measurements: Height: 5\' 9"  (175.3 cm) Weight: 77.1 kg (170 lb) IBW/kg (Calculated) : 70.7  Vital Signs: Temp: 97.1 F (36.2 C) (05/23 1215) Temp Source: Oral (05/23 0612) BP: 112/64 (05/23 1201) Pulse Rate: 78 (05/23 1215)  Labs: No results for input(s): HGB, HCT, PLT, APTT, LABPROT, INR, HEPARINUNFRC, HEPRLOWMOCWT, CREATININE, CKTOTAL, CKMB, TROPONINIHS in the last 72 hours.  Estimated Creatinine Clearance: 39.4 mL/min (A) (by C-G formula based on SCr of 1.62 mg/dL (H)).   Medical History: Past Medical History:  Diagnosis Date  . Anxiety   . BPH (benign prostatic hyperplasia)   . Cerebral aneurysm   . Chronic kidney disease    stage 2  . Colon polyps   . Coronary artery disease    mild-mod non-obstructive CAD  . Erectile dysfunction   . GERD (gastroesophageal reflux disease)   . History of kidney stones   . History of stomach ulcers   . Hypercholesteremia   . Hypertension   . 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  Pharmacy consulted to monitor low dose heparin infusion s/p IR procedure.  Patient not on any anticoagulants PTA.  Heparin started at 500 units/hr post procedure.  Goal of Therapy:  Heparin level 0.1-0.25 units/ml Monitor platelets by anticoagulation protocol: Yes   Plan:  Heparin 500 units/hr Heparin level  in 8 hours  Alanda Slim, PharmD, [facility] Clinical Pharmacist Please see AMION for all Pharmacists' Contact Phone Numbers 06/29/2020, 12:41 PM

## 2020-06-29 NOTE — Sedation Documentation (Signed)
12 cc air total in TR band

## 2020-06-29 NOTE — H&P (Signed)
Chief Complaint: Patient was seen in consultation today for cerebral arteriogram with probable left internal carotid artery angioplasty/stent at the request of Dr Myrla Halsted   Supervising Physician: Luanne Bras  Patient Status: [facility] - Out-pt  History of Present Illness: RAIN FRIEDT is a 75 y.o. male   Known to NIR Previous R ICA angioplasty/stent 08/2017  05/18/20: Status post endovascular treatment of 2.7 mm x 2.5 mm irregular blister-like aneurysm in the supraclinoid left ICA with placement of a 4.5 mm x 14 mm pipeline shield flow diverter device. noted is presence of a 50% stenosis of the proximal left ICA associated with 2 pseudo aneurysms as described earlier.  Pt was seen 06/02/20 with Dr Estanislado Pandy in follow up [place]: Patient advised to continue taking his medications, and maintain adequate hydration. Patient can return to driving and routine daily chores. Patient to be scheduled for left internal carotid artery stent with anesthesia as previously discussed 4 weeks from today. Both the patient and spouse were asked to call should they have any concerns or questions  Here today for left internal carotid artery stent with anesthesia with Dr Estanislado Pandy [id number]:  136 today Pt has no complaints Denies headache; N/V Denies vision oe speech changes Denies numbness or tingling No smoking    Past Medical History:  Diagnosis Date  . Anxiety   . BPH (benign prostatic hyperplasia)   . Cerebral aneurysm   . Chronic kidney disease    stage 2  . Colon polyps   . Coronary artery disease    mild-mod non-obstructive CAD  . Erectile dysfunction   . GERD (gastroesophageal reflux disease)   . History of kidney stones   . History of stomach ulcers   . Hypercholesteremia   . Hypertension   . 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 APPENDECTOMY  1970  . CATARACT EXTRACTION W/ INTRAOCULAR LENS  IMPLANT,  BILATERAL    . COLONOSCOPY  10/2012  . ESOPHAGOGASTRODUODENOSCOPY ENDOSCOPY  10/2012  . HERNIA REPAIR    . IR 3D INDEPENDENT WKST  04/14/2020  . IR ANGIO INTRA EXTRACRAN SEL COM CAROTID INNOMINATE BILAT MOD SED  08/09/2017  . IR ANGIO INTRA EXTRACRAN SEL COM CAROTID INNOMINATE BILAT MOD SED  11/08/2018  . IR ANGIO INTRA EXTRACRAN SEL COM CAROTID INNOMINATE BILAT MOD SED  04/14/2020  . IR ANGIO INTRA EXTRACRAN SEL INTERNAL CAROTID UNI L MOD SED  05/20/2020  . IR ANGIO VERTEBRAL SEL VERTEBRAL BILAT MOD SED  08/09/2017  . IR ANGIO VERTEBRAL SEL VERTEBRAL BILAT MOD SED  11/08/2018  . IR ANGIO VERTEBRAL SEL VERTEBRAL BILAT MOD SED  04/14/2020  . IR ANGIOGRAM FOLLOW UP STUDY  05/20/2020  . IR INTRAVSC STENT CERV CAROTID W/EMB-PROT MOD SED INCL ANGIO  08/23/2017  . IR RADIOLOGIST EVAL & MGMT  05/05/2020  . IR RADIOLOGIST EVAL & MGMT  06/03/2020  . IR TRANSCATH/EMBOLIZ  05/18/2020  . IR US GUIDE VASC ACCESS RIGHT  04/14/2020  . IR US GUIDE VASC ACCESS RIGHT  05/20/2020  . LEFT HEART CATH AND CORONARY ANGIOGRAPHY N/A 11/15/2018   Procedure: LEFT HEART CATH AND CORONARY ANGIOGRAPHY;  Surgeon: Nelva Bush, MD;  Location: [place] [facility];  Service: Cardiovascular;  Laterality: N/A;  . PROSTATE SURGERY  1999   20 years ago, enlarged prostate  . RADIOLOGY WITH ANESTHESIA N/A 08/23/2017   Procedure: IR WITH ANESTHESIA STENT PLACEMENT;  Surgeon: Luanne Bras,  MD;  Location: [place];  Service: Radiology;  Laterality: N/A;  . RADIOLOGY WITH ANESTHESIA N/A 05/18/2020   Procedure: IR WITH ANESTHESIA EMBOLIZATION;  Surgeon: Luanne Bras, MD;  Location: [place];  Service: Radiology;  Laterality: N/A;    Allergies: Bee venom and Penicillins  Medications: Prior to Admission medications   Medication Sig Start Date End Date Taking? Authorizing Provider  acetaminophen (TYLENOL) 325 MG tablet Take 650 mg by mouth every 6 (six) hours as needed (pain).   Yes [provider]  amLODipine (NORVASC) 10 MG tablet  Take 1 tablet (10 mg total) by mouth daily. 05/28/20  Yes Fay Records, MD  aspirin EC 81 MG tablet Take 81 mg by mouth in the morning.   Yes [provider]  clopidogrel (PLAVIX) 75 MG tablet Take 1 tablet (75 mg total) by mouth daily. Patient taking differently: Take 75 mg by mouth in the morning. 04/13/18  Yes Bruning, Lennette Bihari, PA-C  escitalopram (LEXAPRO) 10 MG tablet TAKE 1 TABLET ONCE DAILY. Patient taking differently: Take 10 mg by mouth at bedtime. 10/07/19  Yes Lesleigh Noe, MD  isosorbide mononitrate (IMDUR) 60 MG 24 hr tablet TAKE 1 TABLET BY MOUTH DAILY. Patient taking differently: Take 60 mg by mouth in the morning. 10/09/19  Yes Bhagat, Bhavinkumar, PA  losartan (COZAAR) 100 MG tablet TAKE 1 TABLET ONCE DAILY. Patient taking differently: Take 100 mg by mouth daily. 04/07/20  Yes Lesleigh Noe, MD  nitroGLYCERIN (NITROSTAT) 0.4 MG SL tablet Place 1 tablet (0.4 mg total) under the tongue every 5 (five) minutes as needed for chest pain. 11/15/18 11/15/19 Yes End, Harrell Gave, MD  pantoprazole (PROTONIX) 40 MG tablet TAKE 1 TABLET BY MOUTH DAILY. Patient taking differently: Take 40 mg by mouth in the morning. 10/07/19  Yes Lesleigh Noe, MD  rosuvastatin (CRESTOR) 40 MG tablet Take 1 tablet (40 mg total) by mouth daily. Patient taking differently: Take 40 mg by mouth at bedtime. 05/11/20  Yes Fay Records, MD  tamsulosin (FLOMAX) 0.4 MG CAPS capsule TAKE 1 CAPSULE 30 MINUTES AFTER THE SAME MEAL EACH DAY ONCE DAY. Patient taking differently: Take 0.4 mg by mouth at bedtime. 04/07/20  Yes Lesleigh Noe, MD     Family History  Problem Relation Age of Onset  . Stroke Mother 52  . Aneurysm Father 33    Social History   Socioeconomic History  . Marital status: Married    Spouse name: Jerrelle Michelsen  . Number of children: 2  . Years of education: 75  . Highest education level: Not on file  Occupational History  . Occupation: Oceanographer.  Tobacco Use  . Smoking status:  Former Smoker    Packs/day: 2.00    Years: 25.00    Pack years: 50.00    Types: Cigarettes    Quit date: 03/10/1996    Years since quitting: 24.3  . Smokeless tobacco: Never Used  Vaping Use  . Vaping Use: Never used  Substance and Sexual Activity  . Alcohol use: No    Alcohol/week: 0.0 standard drinks    Comment: in the past drank, quit 1993  . Drug use: No  . Sexual activity: Not Currently  Other Topics Concern  . Not on file  Social History Narrative   Lives with [name] (Wife) - together for 25 years   Children - [name] and [name] -- one is a Marine scientist in [facility] [employer] [facility] and other works for [employer] - 66, lives in  [place] - [place]   Enjoys - playing golf, goes to [employer] and sponsors people as well, church   Support - family, good friends in [place]   Exercise - walks the dog, runs up hills   Diet - improved from before, tries to drink water - low salt or no salt   Social Determinants of Radio broadcast assistant Strain: Not on file  Food Insecurity: Not on file  Transportation Needs: Not on file  Physical Activity: Not on file  Stress: Not on file  Social Connections: Not on file    Review of Systems: A 12 point ROS discussed and pertinent positives are indicated in the HPI above.  All other systems are negative.  Review of Systems  Constitutional: Negative for activity change, appetite change, fatigue, fever and unexpected weight change.  HENT: Negative for tinnitus and trouble swallowing.   Eyes: Negative for visual disturbance.  Respiratory: Negative for cough and shortness of breath.   Cardiovascular: Negative for chest pain.  Gastrointestinal: Negative for abdominal pain, nausea and vomiting.  Musculoskeletal: Negative for back pain and gait problem.  Neurological: Negative for dizziness, tremors, seizures, syncope, facial asymmetry, speech difficulty, weakness, light-headedness, numbness and headaches.  Psychiatric/Behavioral: Negative for behavioral problems and  confusion.    Vital Signs: BP 119/74   Pulse 79   Temp 97.8 F (36.6 C) (Oral)   Resp 18   Ht 5\' 9"  (1.753 m)   Wt 170 lb (77.1 kg)   SpO2 98%   BMI 25.10 kg/m   Physical Exam Vitals reviewed.  HENT:     Mouth/Throat:     Mouth: Mucous membranes are moist.  Eyes:     Extraocular Movements: Extraocular movements intact.  Cardiovascular:     Rate and Rhythm: Normal rate and regular rhythm.     Heart sounds: Normal heart sounds.  Pulmonary:     Effort: Pulmonary effort is normal.     Breath sounds: Normal breath sounds.  Abdominal:     Palpations: Abdomen is soft.     Tenderness: There is no abdominal tenderness.  Musculoskeletal:        General: Normal range of motion.     Cervical back: Normal range of motion.     Right lower leg: No edema.     Left lower leg: No edema.  Skin:    General: Skin is warm.  Neurological:     Mental Status: He is oriented to person, place, and time.  Psychiatric:        Behavior: Behavior normal.     Imaging: IR Radiologist Eval & Mgmt  Result Date: 06/03/2020 EXAM: ESTABLISHED PATIENT OFFICE VISIT CHIEF COMPLAINT: Follow-up subsequent to treatment of left internal carotid artery intracranial aneurysm. Current Pain Level: 1-10 HISTORY OF PRESENT ILLNESS: Patient is a 75 year old right handed gentleman who underwent an uneventful treatment of left internal carotid artery supraclinoid aneurysm with pipeline flex shield flow diverter approximately 2 weeks ago. Patient had no periprocedural, or immediate postprocedural events. He was discharged home under the care of his wife. The patient returns accompanied by wife for follow-up. Clinically, the patient reports almost back to his normal self. His only complaint was that of a left parietal headache a few days ago without associated nausea, vomiting or photophobia, or speech or motor sensory or coordination difficulties. This time was felt to be related to increased blood pressure. Patient was  evaluated by his cardiologist. He had his amlodipine increased in dose. He also had his rosavustatin  increased. Persistent headaches to be significantly decreased and may require an occasional timed. The rest of his review of systems is intact. Patient is keen to return to his normal level of activities involving exercise and daily chores. Past Medical History: As above Medications: Amlodipine and rosuvastatin increased. The remaining 75 mg medications unchanged. Aspirin 81 mg a day and Plavix Allergies: Penicillin and bee stings. Social History: No change as previously. Family History:  No change. REVIEW OF SYSTEMS: As above 10-14 Systems PHYSICAL EXAMINATION: Grossly intact neurologically. ASSESSMENT AND PLAN: Patient advised to continue taking his medications, and maintain adequate hydration. Patient can return to driving and routine daily chores. Patient to be scheduled for left internal carotid artery stent with anesthesia as previously discussed 4 weeks from today. Both the patient and spouse were asked to call should they have any concerns or questions. Electronically Signed   By: Luanne Bras M.D.   On: 06/02/2020 14:49    Labs:  CBC: Recent Labs    05/04/20 1832 05/18/20 0609 05/19/20 0515 06/26/20 1005  WBC 4.5 5.3 7.7 4.9  HGB 13.0 14.2 12.1* 13.5  HCT 38.1* 41.7 36.5* 40.8  PLT 164 167 162 165    COAGS: Recent Labs    04/14/20 0827 05/18/20 0609 06/26/20 1005  INR 1.0 1.1 1.0  APTT  --  30  --     BMP: Recent Labs    01/13/20 1138 04/14/20 0827 05/04/20 1832 05/18/20 0609 05/19/20 0515 06/26/20 1005  NA 143   < > 137 138 137 137  K 4.1   < > 3.8 4.0 3.8 4.3  CL 107*   < > 104 109 108 107  CO2 22   < > 29 26 21* 27  GLUCOSE 98   < > 108* 101* 114* 97  BUN 18   < > 18 18 21 17   CALCIUM 9.3   < > 9.5 9.2 9.0 9.5  CREATININE 1.50*   < > 1.68* 1.71* 1.50* 1.62*  GFRNONAA 45*   < > 42* 41* 48* 44*  GFRAA 52*  --   --   --   --   --    < > = values in this  interval not displayed.    LIVER FUNCTION TESTS: Recent Labs    05/04/20 1832  BILITOT 0.6  AST 18  ALT 13  ALKPHOS 73  PROT 6.6  ALBUMIN 3.7    TUMOR MARKERS: No results for input(s): AFPTM, CEA, CA199, CHROMGRNA in the last 8760 hours.  Assessment and Plan:  Previous R ICA angioplasty/stent 08/2017  05/18/20: Status post endovascular treatment of 2.7 mm x 2.5 mm irregular blister-like aneurysm in the supraclinoid left ICA with placement of a 4.5 mm x 14 mm pipeline shield flow diverter device. Noted is presence of a 50% stenosis of the proximal left ICA associated with 2 pseudo aneurysms as described earlier.  Pt was seen 06/02/20 with Dr Estanislado Pandy in follow up Patient to be scheduled for left internal carotid artery stent with anesthesia as previously discussed 4 weeks from today.  Scheduled today for cerebral arteriogram with probable left internal carotid artery angioplasty/stent placement. Risks and benefits of cerebral angiogram with intervention were discussed with the patient including, but not limited to bleeding, infection, vascular injury, contrast induced renal failure, stroke or even death.  This interventional procedure involves the use of X-rays and because of the nature of the planned procedure, it is possible that we will have prolonged use of X-ray fluoroscopy.  Potential radiation risks to you include (but are not limited to) the following: - A slightly elevated risk for cancer  several years later in life. This risk is typically less than 0.5% percent. This risk is low in comparison to the normal incidence of human cancer, which is 33% for women and 50% for men according to the [place]. - Radiation induced injury can include skin redness, resembling a rash, tissue breakdown / ulcers and hair loss (which can be temporary or permanent).   The likelihood of either of these occurring depends on the difficulty of the procedure and whether you  are sensitive to radiation due to previous procedures, disease, or genetic conditions.   IF your procedure requires a prolonged use of radiation, you will be notified and given written instructions for further action.  It is your responsibility to monitor the irradiated area for the 2 weeks following the procedure and to notify your physician if you are concerned that you have suffered a radiation induced injury.    All of the patient's questions were answered, patient is agreeable to proceed.  Consent signed and in chart.  Pt is aware- if intervention is performed- he will be admitted to Neuro ICU overnight for observation. Plan to Dc in am if stable He is agreeable to proceed   Thank you for this interesting consult.  I greatly enjoyed meeting ALAND CHESTNUTT and look forward to participating in their care.  A copy of this report was sent to the requesting provider on this date.  Electronically Signed: Lavonia Drafts, PA-C 06/29/2020, 7:50 AM   I spent a total of    25 Minutes in face to face in clinical consultation, greater than 50% of which was counseling/coordinating care for L ICA angioplasty/stent

## 2020-06-29 NOTE — Progress Notes (Signed)
ANTICOAGULATION CONSULT NOTE - Initial Consult  Pharmacy Consult for Heparin Indication: carotid stent  Allergies  Allergen Reactions  . Bee Venom Anaphylaxis and Shortness Of Breath  . Penicillins Rash    PATIENT HAS HAD A PCN REACTION WITH IMMEDIATE RASH, FACIAL/TONGUE/THROAT SWELLING, SOB, OR LIGHTHEADEDNESS WITH HYPOTENSION:  #  #  YES  #  #  Has patient had a PCN reaction causing severe rash involving mucus membranes or skin necrosis: no Has patient had a PCN reaction that required hospitalization: no Has patient had a PCN reaction occurring within the last 10 years: no     Patient Measurements: Height: 5\' 9"  (175.3 cm) Weight: 77.1 kg (170 lb) IBW/kg (Calculated) : 70.7  Vital Signs: Temp: 97.9 F (36.6 C) (05/23 2000) Temp Source: Oral (05/23 2000) BP: 111/66 (05/23 2000) Pulse Rate: 80 (05/23 2000)  Labs: Recent Labs    06/29/20 2053  [place] <0.10*    Estimated Creatinine Clearance: 39.4 mL/min (A) (by C-G formula based on SCr of 1.62 mg/dL (H)).   Medical History: Past Medical History:  Diagnosis Date  . Anxiety   . BPH (benign prostatic hyperplasia)   . Cerebral aneurysm   . Chronic kidney disease    stage 2  . Colon polyps   . Coronary artery disease    mild-mod non-obstructive CAD  . Erectile dysfunction   . GERD (gastroesophageal reflux disease)   . History of kidney stones   . History of stomach ulcers   . Hypercholesteremia   . Hypertension   . 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  Pharmacy consulted to monitor low dose heparin infusion s/p IR procedure.  Patient not on any anticoagulants PTA.  Heparin started at 500 units/hr post procedure. -initial heparin level < 0.1  Goal of Therapy:  Heparin level 0.1-0.25 units/ml Monitor platelets by anticoagulation protocol: Yes   Plan:  Increase heparin to 750 units/hr Heparin off at 8am for sheath pull Will not check further heparin  levels CBC in am  Hildred Laser, PharmD Clinical Pharmacist **Pharmacist phone directory can now be found on [email or web address] ([email or web address]).  Listed under [place].

## 2020-06-30 ENCOUNTER — Other Ambulatory Visit: Payer: Self-pay | Admitting: Physician Assistant

## 2020-06-30 ENCOUNTER — Other Ambulatory Visit: Payer: Self-pay | Admitting: Family Medicine

## 2020-06-30 ENCOUNTER — Encounter (HOSPITAL_COMMUNITY): Payer: Self-pay | Admitting: Interventional Radiology

## 2020-06-30 ENCOUNTER — Telehealth (HOSPITAL_COMMUNITY): Payer: Self-pay

## 2020-06-30 ENCOUNTER — Other Ambulatory Visit: Payer: Self-pay | Admitting: Internal Medicine

## 2020-06-30 DIAGNOSIS — I63232 Cerebral infarction due to unspecified occlusion or stenosis of left carotid arteries: Secondary | ICD-10-CM | POA: Diagnosis not present

## 2020-06-30 DIAGNOSIS — I10 Essential (primary) hypertension: Secondary | ICD-10-CM

## 2020-06-30 DIAGNOSIS — I729 Aneurysm of unspecified site: Secondary | ICD-10-CM | POA: Diagnosis not present

## 2020-06-30 DIAGNOSIS — Z9889 Other specified postprocedural states: Secondary | ICD-10-CM | POA: Diagnosis not present

## 2020-06-30 LAB — BASIC METABOLIC PANEL
Anion gap: 5 (ref 5–15)
BUN: 20 mg/dL (ref 8–23)
CO2: 22 mmol/L (ref 22–32)
Calcium: 8.8 mg/dL — ABNORMAL LOW (ref 8.9–10.3)
Chloride: 109 mmol/L (ref 98–111)
Creatinine, Ser: 1.5 mg/dL — ABNORMAL HIGH (ref 0.61–1.24)
GFR, Estimated: 48 mL/min — ABNORMAL LOW (ref >60)
Glucose, Bld: 124 mg/dL — ABNORMAL HIGH (ref 70–99)
Potassium: 4 mmol/L (ref 3.5–5.1)
Sodium: 136 mmol/L (ref 135–145)

## 2020-06-30 LAB — CBC WITH DIFFERENTIAL/PLATELET
Abs Immature Granulocytes: 0.02 10*3/uL (ref 0.00–0.07)
Basophils Absolute: 0 10*3/uL (ref 0.0–0.1)
Basophils Relative: 0 %
Eosinophils Absolute: 0 10*3/uL (ref 0.0–0.5)
Eosinophils Relative: 0 %
HCT: 32.2 % — ABNORMAL LOW (ref 39.0–52.0)
Hemoglobin: 11 g/dL — ABNORMAL LOW (ref 13.0–17.0)
Immature Granulocytes: 0 %
Lymphocytes Relative: 8 %
Lymphs Abs: 0.6 10*3/uL — ABNORMAL LOW (ref 0.7–4.0)
MCH: 31 pg (ref 26.0–34.0)
MCHC: 34.2 g/dL (ref 30.0–36.0)
MCV: 90.7 fL (ref 80.0–100.0)
Monocytes Absolute: 0.6 10*3/uL (ref 0.1–1.0)
Monocytes Relative: 7 %
Neutro Abs: 6.2 10*3/uL (ref 1.7–7.7)
Neutrophils Relative %: 85 %
Platelets: 143 10*3/uL — ABNORMAL LOW (ref 150–400)
RBC: 3.55 MIL/uL — ABNORMAL LOW (ref 4.22–5.81)
RDW: 13.1 % (ref 11.5–15.5)
WBC: 7.4 10*3/uL (ref 4.0–10.5)
nRBC: 0 % (ref 0.0–0.2)

## 2020-06-30 NOTE — Discharge Summary (Signed)
Patient ID: John Salazar MRN: [id number] DOB/AGE: [birth year] 75 y.o.  Admit date: 06/29/2020 Discharge date: 06/30/2020  Supervising Physician: Luanne Bras  Patient Status: [facility] - In-pt  Admission Diagnoses: Internal carotid artery stenosis, left  Discharge Diagnoses:  Active Problems:   Internal carotid artery stenosis, left   Discharged Condition: stable  Hospital Course:  Patient presented to [facility] 06/29/2020 for an image-guided cerebral arteriogram with revascularization of Left ICA stenosis with associated pseudoaneurysms s/p revascularization using stent placement via right radial approach by Dr. Estanislado Pandy. Procedure occurred without major complications and patient was transferred to neuro ICU in stable condition (VSS, right radial puncture site stable) for overnight observation. No major events occurred overnight.  Patient awake and alert sitting in bed. Wife at bedside. States ate breakfast without difficulty. No complaints. Moving all extremities. Right radial puncture site stable. Plan to discharge home today and follow-up with Dr. Estanislado Pandy in clinic 2 weeks after discharge.   Consults: None  Significant Diagnostic Studies: IR Radiologist Eval & Mgmt  Result Date: 06/03/2020 EXAM: ESTABLISHED PATIENT OFFICE VISIT CHIEF COMPLAINT: Follow-up subsequent to treatment of left internal carotid artery intracranial aneurysm. Current Pain Level: 1-10 HISTORY OF PRESENT ILLNESS: Patient is a 75 year old right handed gentleman who underwent an uneventful treatment of left internal carotid artery supraclinoid aneurysm with pipeline flex shield flow diverter approximately 2 weeks ago. Patient had no periprocedural, or immediate postprocedural events. He was discharged home under the care of his wife. The patient returns accompanied by wife for follow-up. Clinically, the patient reports almost back to his normal self. His only complaint was that of a left parietal headache a few  days ago without associated nausea, vomiting or photophobia, or speech or motor sensory or coordination difficulties. This time was felt to be related to increased blood pressure. Patient was evaluated by his cardiologist. He had his amlodipine increased in dose. He also had his rosavustatin increased. Persistent headaches to be significantly decreased and may require an occasional timed. The rest of his review of systems is intact. Patient is keen to return to his normal level of activities involving exercise and daily chores. Past Medical History: As above Medications: Amlodipine and rosuvastatin increased. The remaining 75 mg medications unchanged. Aspirin 81 mg a day and Plavix Allergies: Penicillin and bee stings. Social History: No change as previously. Family History:  No change. REVIEW OF SYSTEMS: As above 10-14 Systems PHYSICAL EXAMINATION: Grossly intact neurologically. ASSESSMENT AND PLAN: Patient advised to continue taking his medications, and maintain adequate hydration. Patient can return to driving and routine daily chores. Patient to be scheduled for left internal carotid artery stent with anesthesia as previously discussed 4 weeks from today. Both the patient and spouse were asked to call should they have any concerns or questions. Electronically Signed   By: Luanne Bras M.D.   On: 06/02/2020 14:49    Treatments: Endovascular revascularization of left ICA stenosis (with associated pseudoaneurysms) using stent placement.  Discharge Exam: Blood pressure (!) 141/74, pulse 89, temperature 97.6 F (36.4 C), temperature source Axillary, resp. rate 19, height 5\' 9"  (1.753 m), weight 170 lb (77.1 kg), SpO2 95 %. Physical Exam Vitals and nursing note reviewed.  Constitutional:      General: He is not in acute distress. Cardiovascular:     Rate and Rhythm: Normal rate.  Pulmonary:     Effort: Pulmonary effort is normal. No respiratory distress.  Skin:    General: Skin is warm and dry.      Comments:  Right radial puncture site soft without active bleeding or hematoma.  Neurological:     Mental Status: He is alert.     Comments: Alert, awake, and oriented x3. Speech and comprehension intact. PERRL bilaterally. Can spontaneously move all extremities. No pronator drift. Distal pulses (radial) palpable bilaterally.     Disposition: Discharge disposition: 01-Home or Self Care       Discharge Instructions    Call MD for:  difficulty breathing, headache or visual disturbances   Complete by: As directed    Call MD for:  extreme fatigue   Complete by: As directed    Call MD for:  hives   Complete by: As directed    Call MD for:  persistant dizziness or light-headedness   Complete by: As directed    Call MD for:  persistant nausea and vomiting   Complete by: As directed    Call MD for:  redness, tenderness, or signs of infection (pain, swelling, redness, odor or green/yellow discharge around incision site)   Complete by: As directed    Call MD for:  severe uncontrolled pain   Complete by: As directed    Call MD for:  temperature >100.4   Complete by: As directed    Diet - low sodium heart healthy   Complete by: As directed    Discharge instructions   Complete by: As directed    Continue taking Plavix 75 mg once daily. Continue taking Aspirin 81 mg once daily. Stay hydrated by drinking plenty of water.   Driving Restrictions   Complete by: As directed    No driving self until 2 week follow-up appointment. Ok to be passenger in car during this time.   Increase activity slowly   Complete by: As directed    Lifting restrictions   Complete by: As directed    No stooping, bending, or lifting more than 10 pounds until 2 week follow-up.     Allergies as of 06/30/2020      Reactions   Bee Venom Anaphylaxis, Shortness Of Breath   Penicillins Rash   PATIENT HAS HAD A PCN REACTION WITH IMMEDIATE RASH, FACIAL/TONGUE/THROAT SWELLING, SOB, OR LIGHTHEADEDNESS WITH  HYPOTENSION:  #  #  YES  #  #  Has patient had a PCN reaction causing severe rash involving mucus membranes or skin necrosis: no Has patient had a PCN reaction that required hospitalization: no Has patient had a PCN reaction occurring within the last 10 years: no      Medication List    TAKE these medications   acetaminophen 325 MG tablet Commonly known as: TYLENOL Take 650 mg by mouth every 6 (six) hours as needed (pain).   amLODipine 10 MG tablet Commonly known as: NORVASC Take 1 tablet (10 mg total) by mouth daily.   aspirin EC 81 MG tablet Take 81 mg by mouth in the morning.   clopidogrel 75 MG tablet Commonly known as: PLAVIX Take 1 tablet (75 mg total) by mouth daily. What changed: when to take this   escitalopram 10 MG tablet Commonly known as: LEXAPRO TAKE 1 TABLET ONCE DAILY. What changed: when to take this   isosorbide mononitrate 60 MG 24 hr tablet Commonly known as: IMDUR TAKE 1 TABLET BY MOUTH DAILY. What changed: when to take this   losartan 100 MG tablet Commonly known as: COZAAR TAKE 1 TABLET ONCE DAILY.   nitroGLYCERIN 0.4 MG SL tablet Commonly known as: Nitrostat Place 1 tablet (0.4 mg total) under the tongue every  5 (five) minutes as needed for chest pain.   pantoprazole 40 MG tablet Commonly known as: PROTONIX TAKE 1 TABLET BY MOUTH DAILY. What changed: when to take this   rosuvastatin 40 MG tablet Commonly known as: CRESTOR Take 1 tablet (40 mg total) by mouth daily. What changed: when to take this   tamsulosin 0.4 MG Caps capsule Commonly known as: FLOMAX TAKE 1 CAPSULE 30 MINUTES AFTER THE SAME MEAL EACH DAY ONCE DAY. What changed:   how much to take  how to take this  when to take this  additional instructions       Follow-up Information    Luanne Bras, MD Follow up in 2 week(s).   Specialties: Interventional Radiology, Radiology Why: Please plan to follow-up with Dr. Estanislado Pandy in clinic 2 weeks after discharge. Our  schedulers will call you to set up this appointment. Contact information: [place] [address] [phone]                Electronically Signed: Earley Abide, PA-C 06/30/2020, 9:42 AM   I have spent Less Than 30 Minutes discharging John Salazar.

## 2020-06-30 NOTE — Discharge Instructions (Signed)
Radial Site Care This sheet gives you information about how to care for yourself after your procedure. Your health care provider may also give you more specific instructions. If you have problems or questions, contact your health care provider. What can I expect after the procedure? After the procedure, it is common to have:  Bruising that usually fades within 1-2 weeks.  Tenderness at the site. Follow these instructions at home: Wound care 1. Follow instructions from your health care provider about how to take care of your insertion site. Make sure you: ? Wash your hands with soap and water before you change your bandage (dressing). If soap and water are not available, use hand sanitizer. ? Change your dressing as directed- pressure dressing removed 24 hours post-procedure (and switch for bandaid), bandaid removed 72 hours post-procedure 2. Do not take baths, swim, or use a hot tub for 7 days post-procedure. 3. You may shower 48 hours after the procedure or as told by your health care provider. ? Gently wash the site with plain soap and water. ? Pat the area dry with a clean towel. ? Do not rub the site. This may cause bleeding. 4. Check your site every day for signs of infection. Check for: ? Redness, swelling, or pain. ? Fluid or blood. ? Warmth. ? Pus or a bad smell. Activity  Do not stoop, bend, or lift anything that is heavier than 10 lb (4.5 kg) for 2 weeks post-procedure.  Do not drive self for 2 weeks post-procedure. Contact a health care provider if you have:  A fever or chills.  You have redness, swelling, or pain around your insertion site. Get help right away if:  The catheter insertion area swells very fast.  You pass out.  You suddenly start to sweat or your skin gets clammy.  The catheter insertion area is bleeding, and the bleeding does not stop when you hold steady pressure on the area.  The area near or just beyond the catheter insertion site becomes  pale, cool, tingly, or numb. These symptoms may represent a serious problem that is an emergency. Do not wait to see if the symptoms will go away. Get medical help right away. Call your local emergency services (911 in the [place] Do not drive yourself to the hospital.  This information is not intended to replace advice given to you by your health care provider. Make sure you discuss any questions you have with your health care provider. Document Revised: 02/06/2017 Document Reviewed: 02/06/2017 Elsevier Patient Education  2020 [employer].

## 2020-06-30 NOTE — Telephone Encounter (Signed)
Called, left vm. AW

## 2020-06-30 NOTE — Anesthesia Postprocedure Evaluation (Signed)
Anesthesia Post Note  Patient: John Salazar  Procedure(s) Performed: RADIOLOGY WITH ANESTHESIA  LEFT CAROTID STENT PLACEMENT (Left )     Patient location during evaluation: PACU Anesthesia Type: General Level of consciousness: awake and alert Pain management: pain level controlled Vital Signs Assessment: post-procedure vital signs reviewed and stable Respiratory status: spontaneous breathing, nonlabored ventilation, respiratory function stable and patient connected to nasal cannula oxygen Cardiovascular status: blood pressure returned to baseline and stable Postop Assessment: no apparent nausea or vomiting Anesthetic complications: no   No complications documented.  Last Vitals:  Vitals:   06/30/20 0345 06/30/20 0400  BP:  131/66  Pulse: 81 78  Resp: (!) 21 18  Temp:  36.7 C  SpO2: 91% 92%    Last Pain:  Vitals:   06/30/20 0400  TempSrc: Oral  PainSc: 0-No pain                 Jahlil Ziller L Alyzae Hawkey

## 2020-07-01 ENCOUNTER — Other Ambulatory Visit (HOSPITAL_COMMUNITY): Payer: Self-pay | Admitting: Interventional Radiology

## 2020-07-01 DIAGNOSIS — I771 Stricture of artery: Secondary | ICD-10-CM

## 2020-07-08 ENCOUNTER — Telehealth: Payer: Self-pay | Admitting: Pharmacist

## 2020-07-08 DIAGNOSIS — I10 Essential (primary) hypertension: Secondary | ICD-10-CM

## 2020-07-08 NOTE — Telephone Encounter (Signed)
Pt calling to give update on BP. After recent stent placement on 06/29/20, he has noticed that his BP readings have been decreasing to readings like 101/65 and 79/49 and he's been feeling dizzy. He has made adjustments to his BP medications as a result.  He has been cutting his losartan 100mg  tablets in half for the past 2-3 days and has been cutting his amlodipine 10mg  in half or in quarters (5 days of the past week he's taken 1/4 tablet, other 2 days he's taken 1/4). His dizziness has improved as have his BP readings up to 114/81, 112/63, 129/78, 124/75, 114/80.  Advised pt to completely stop amlodipine, and to continue losartan - he can take either 50mg  or 100mg  daily but keep a close eye on his BP to see which dose will keep his BP at goal. Will call pt in 1 week to reassess.

## 2020-07-14 ENCOUNTER — Other Ambulatory Visit: Payer: Self-pay

## 2020-07-14 ENCOUNTER — Ambulatory Visit (HOSPITAL_COMMUNITY)
Admission: RE | Admit: 2020-07-14 | Discharge: 2020-07-14 | Disposition: A | Discharge status: Home / Self Care | Payer: PPO | Source: Ambulatory Visit | Attending: Student | Admitting: Student

## 2020-07-14 DIAGNOSIS — Z9889 Other specified postprocedural states: Secondary | ICD-10-CM | POA: Diagnosis not present

## 2020-07-14 DIAGNOSIS — I63232 Cerebral infarction due to unspecified occlusion or stenosis of left carotid arteries: Secondary | ICD-10-CM | POA: Diagnosis not present

## 2020-07-14 DIAGNOSIS — I6521 Occlusion and stenosis of right carotid artery: Secondary | ICD-10-CM

## 2020-07-15 HISTORY — PX: IR RADIOLOGIST EVAL & MGMT: IMG5224

## 2020-07-15 MED ORDER — LOSARTAN POTASSIUM 100 MG PO TABS: 50.0000 mg | ORAL_TABLET | Freq: Two times a day (BID) | ORAL | 3 refills | Status: DC

## 2020-07-15 NOTE — Telephone Encounter (Signed)
Called pt to follow up with BP readings. Reports losartan 100mg  has still been a bit too much - taking 50mg  BID instead which he tolerates better. BP reading was 130/73 this AM, usually lower in the evenings 967-289 systolic. Feels fine when SBP is 115 or above, feels bad when SBP drops to the mid 90s. Advised pt to continue with losartan 50mg  BID and can skip 2nd dose if his pressure is dropping low. He verbalized understanding and was appreciative for the follow up. Advised him to call with any BP issues in the future.

## 2020-07-15 NOTE — Addendum Note (Signed)
Addended by: Hristopher Missildine E on: 07/15/2020 10:06 AM   Modules accepted: Orders

## 2020-08-07 ENCOUNTER — Encounter: Payer: Self-pay | Admitting: Podiatry

## 2020-08-07 ENCOUNTER — Ambulatory Visit: Payer: PPO | Admitting: Podiatry

## 2020-08-07 ENCOUNTER — Other Ambulatory Visit: Payer: Self-pay

## 2020-08-07 DIAGNOSIS — M722 Plantar fascial fibromatosis: Secondary | ICD-10-CM

## 2020-08-10 DIAGNOSIS — M722 Plantar fascial fibromatosis: Secondary | ICD-10-CM | POA: Diagnosis not present

## 2020-08-10 MED ORDER — TRIAMCINOLONE ACETONIDE 10 MG/ML IJ SUSP
10.0000 mg | Freq: Once | INTRAMUSCULAR | Status: AC
  Administered 2020-08-10: 10 mg

## 2020-08-10 NOTE — Progress Notes (Signed)
Subjective:   Patient ID: John Salazar, male   DOB: 75 y.o.   MRN: [id number]   HPI Patient states that he needs new orthotics and also he has had pain in the bottom of the right heel that is been sore and making it hard to be active and his orthotics have worn out   ROS      Objective:  Physical Exam  Neurovascular status intact with inflammation pain plantar aspect right heel at the insertional point tendon calcaneus fluid buildup around the medial band with flatfoot deformity noted bilateral     Assessment:  Chronic fasciitis-like condition bilateral with inflammation fluid of the medial band right     Plan:  H&P reviewed condition sterile prep injected the plantar fascial right 3 mg Kenalog 5 mg Xylocaine applied fascial brace as needed that he has at home and went ahead today and casted for functional orthotics for the long-term that will be made directly to his arch in a neutral position

## 2020-08-14 ENCOUNTER — Telehealth: Payer: Self-pay | Admitting: Pharmacist

## 2020-08-14 DIAGNOSIS — I10 Essential (primary) hypertension: Secondary | ICD-10-CM

## 2020-08-14 MED ORDER — AMLODIPINE BESYLATE 10 MG PO TABS: 10.0000 mg | ORAL_TABLET | Freq: Every day | ORAL | 3 refills | Status: DC

## 2020-08-14 MED ORDER — LOSARTAN POTASSIUM 100 MG PO TABS: 100.0000 mg | ORAL_TABLET | Freq: Every day | ORAL | 3 refills | Status: DC

## 2020-08-14 NOTE — Telephone Encounter (Signed)
Pt called clinic with BP update. States he restarted amlodipine 5mg  then increased to 10mg  based on home BP readings. He also increased his losartan back to 100mg  daily.  Home BP 106/66, 121/71, 133/84, 126/79, 124/74.  Med list updated and refills sent in. Pt aware to continue monitoring BP at home and call with any issues.

## 2020-08-17 DIAGNOSIS — L812 Freckles: Secondary | ICD-10-CM | POA: Diagnosis not present

## 2020-08-17 DIAGNOSIS — Z85828 Personal history of other malignant neoplasm of skin: Secondary | ICD-10-CM | POA: Diagnosis not present

## 2020-08-17 DIAGNOSIS — L821 Other seborrheic keratosis: Secondary | ICD-10-CM | POA: Diagnosis not present

## 2020-08-17 DIAGNOSIS — L819 Disorder of pigmentation, unspecified: Secondary | ICD-10-CM | POA: Diagnosis not present

## 2020-08-17 DIAGNOSIS — L570 Actinic keratosis: Secondary | ICD-10-CM | POA: Diagnosis not present

## 2020-08-17 DIAGNOSIS — L905 Scar conditions and fibrosis of skin: Secondary | ICD-10-CM | POA: Diagnosis not present

## 2020-08-26 IMAGING — US IR ANGIO INTRA EXTRACRAN SEL COM CAROTID INNOMINATE BILAT MOD SE
1 series · 3 of 3 positions shown · non-contrast
Comparison: Diagnostic arteriogram August 23, 2017.
COMPARISON: Diagnostic arteriogram August 23, 2017.

Addendum:
CLINICAL DATA: History of right internal carotid artery stent
assisted angioplasty for severe stenosis. Also discovery of
intracranial stenosis, and also of left internal carotid artery
superior hypophyseal region aneurysm.

EXAM:
BILATERAL COMMON CAROTID AND INNOMINATE ANGIOGRAPHY
TECHNIQUE: Informed written consent was obtained from the patient after a
thorough discussion of the procedural risks, benefits and
alternatives. All questions were addressed. Maximal Sterile Barrier
Technique was utilized including caps, mask, sterile gowns, sterile
gloves, sterile drape, hand hygiene and skin antiseptic. A timeout
was performed prior to the initiation of the procedure.

[Series 1: ir (id) (id) · 3 of 3 slices shown]
[im 1/3]
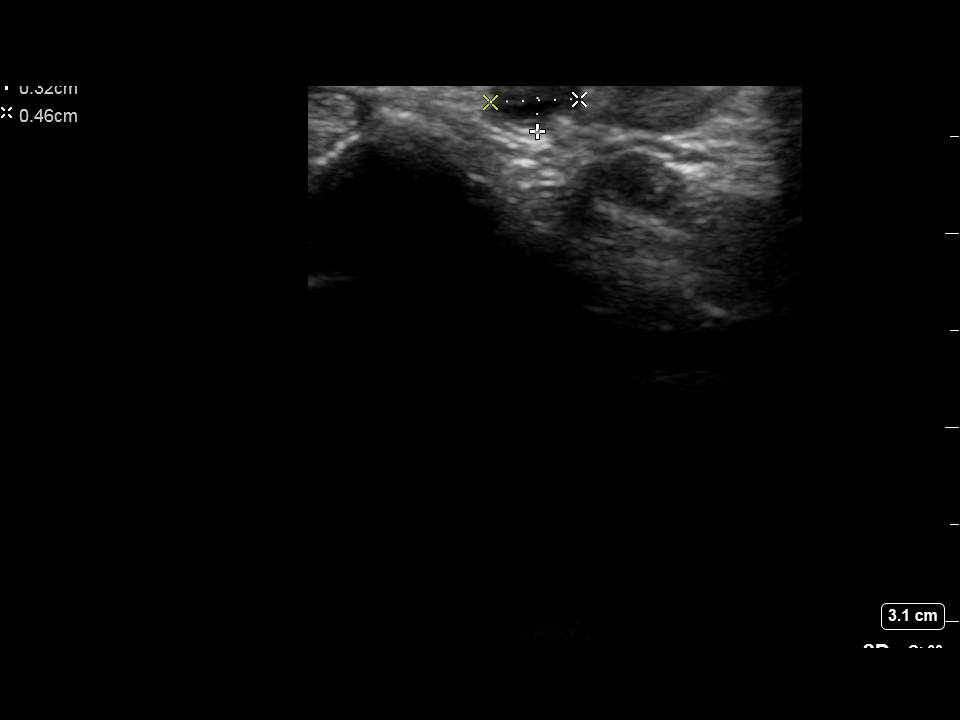
[im 2/3]
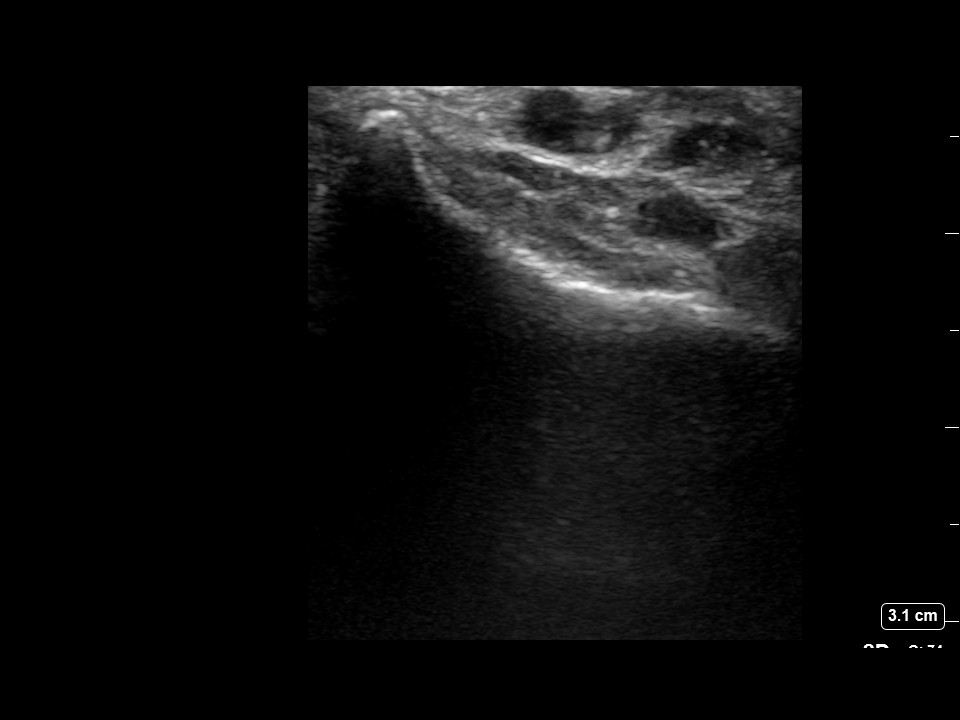
[im 3/3]
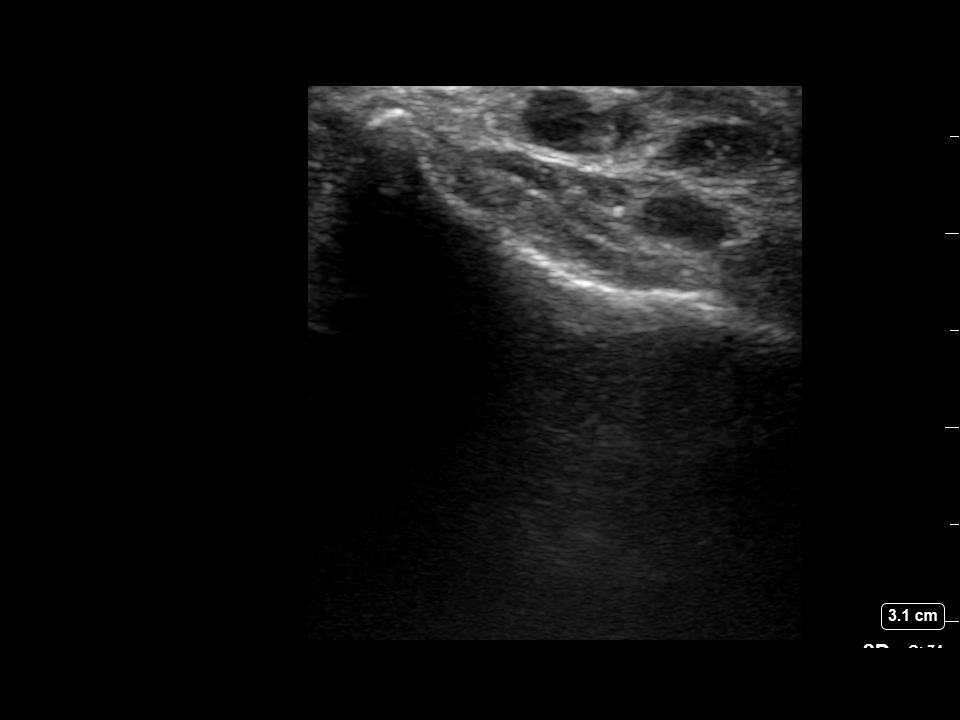

[3 of 3 positions shown; findings below may reference images not displayed]

MEDICATIONS:
Heparin 6555 units intra-arterial. No antibiotic was administered
within 1 hour of the procedure.

ANESTHESIA/SEDATION:
Versed 1 mg IV; Fentanyl 25 mcg IV

Moderate Sedation Time:  30 minutes

The patient was continuously monitored during the procedure by the
interventional radiology nurse under my direct supervision.

CONTRAST:  Isovue 300 approximately 60 mL.

FLUOROSCOPY TIME:  Fluoroscopy Time: 8 minutes 30 seconds (920 mGy).

COMPLICATIONS:
None immediate.
The right forearm to the right wrist was prepped and draped in the
usual sterile fashion.

The right radial artery was palpated and identified with ultrasound.
Morphology of the radial artery was then evaluated and documented
with ultrasound. Thereafter, using ultrasound guidance, transradial
access into the right radial artery was then obtained using
micropuncture set. Over a 0.018 inch micro guidewire, a [DATE] French
radial sheath was then inserted. The obturator, and the wire were
removed. Good aspiration obtained from the side port of the radial
sheath. A cocktail of 2.5 mg of verapamil, 6555 units of heparin,
and 200 units of nitroglycerin was then injected in diluted form
without event.

A right radial artery arteriogram was then obtained.

Over a 0.035 inch Roadrunner guidewire, a 5 [employer] Berke 2
diagnostic catheter was then advanced to the aortic arch region, and
select cannulation was performed of the right vertebral artery, the
right common carotid artery, the left common carotid artery and the
left subclavian arteries.

Hemostasis in the right radial artery puncture site was then
obtained with a wrist band. Distal right radial pulse was verified.
FINDINGS: The origin of the right vertebral artery demonstrates just under 50%
narrowing.

The vessel is, otherwise, seen to opacify to the cranial skull base.
Wide patency is seen of the right vertebrobasilar junction and the
right posterior-inferior cerebellar artery.

The posterior-inferior cerebral artery demonstrates focal areas of
moderate narrowing in the proximal [DATE] very likely representing
intracranial arteriosclerosis.

Mild atherosclerotic disease is also noted along the right
vertebrobasilar junction.

The basilar artery, the posterior cerebral arteries, the superior
cerebellar arteries and the anterior-inferior cerebellar arteries
opacify into the capillary and venous phases.

There is an area of stenosis of approximately 70% involving the
right posterior cerebral artery P1 segment. Unopacified blood is
seen in the basilar artery from the contralateral vertebral artery.
The origin of the left vertebral artery is widely patent and
associated with moderate tortuosity proximally.

Otherwise, the vessel is seen to opacify to the cranial skull base.

There is approximately 50% stenosis of the left vertebrobasilar
junction proximal to the origin of the left posterior-inferior
cerebellar artery.

The opacified basilar artery, the posterior cerebral artery, the
superior cerebral arteries and the anterior-inferior cerebellar
arteries demonstrate flow through them. Again demonstrated is the
approximately 70% stenosis of the right posterior cerebral artery P1
segment.

The right common arteriogram demonstrates the right external carotid
artery and its major branches to be widely patent.

A stented segment of the distal right common carotid artery
extending into the right internal carotid artery demonstrates wide
patency without evidence of intra stent stenosis.

More distally the vessel is seen to opacify to the cranial skull
base. The distal cervical and the petrous segments demonstrate wide
patency.

There is approximately 50% stenosis of the proximal cavernous
segment of the right internal carotid artery.

More distally the supraclinoid segment is widely patent.

The right middle cerebral artery demonstrates approximately 50%
stenosis in the mid M1 segment secondary to a soft atherosclerotic
disease. Also on the oblique maximized views there is approximately
70% stenosis of the distal right M1 segment just proximal to the
trifurcation.

The right MCA trifurcation branches demonstrate wide patency.

The right anterior cerebral artery opacifies into the capillary and
venous phases. The left common carotid arteriogram demonstrates the
left external carotid artery and its major branches to be widely
patent.

The left internal carotid artery at the bulb demonstrates
approximately just under 50% stenosis. This is associated with at
least 2 outpouchings involving the posterior wall of the left
internal carotid artery measuring 3 mm x 3 mm, and the more proximal
one measuring at least 4 mm x 3 mm. More distally the left internal
carotid artery is seen to opacify to the cranial skull base.

Wide patency is seen of the petrous, the cavernous and the
supraclinoid segments.

Arising in the superior hypophyseal region of the left internal
carotid artery is approximately 4 mm x 3.1 mm saccular outpouching
probably representing an aneurysm.

The left middle cerebral artery and the left anterior cerebral
artery opacify into the capillary and venous phases.
IMPRESSION: Wide patency of the previously stented segment of the right internal
carotid artery proximally.

Approximately 50% stenosis of the right internal carotid artery
proximal cavernous segment.

Approximately 50% stenosis of the right MCA M1 segment, with
suspicion of a 70% stenosis of the distal right M1 segment.

Just under 50% stenosis of the origin of the right vertebral artery.

Approximately 50% stenosis of the left vertebrobasilar junction
proximal to the left posterior-inferior cerebellar artery.

Approximately 50% stenosis of the proximal left internal carotid
artery at the bulb associated with 2 focal outpouchings measuring 3
mm x 3 mm, and 3 mm x 4 mm which probably represent focal
ulcerations/saccular aneurysms associated with atherosclerotic
disease. Approximately 4.1 mm x 3 mm left internal carotid artery
superior hypophyseal region aneurysm.

Moderate intracranial arteriosclerosis of the right
posterior-inferior cerebellar artery, and also approximately 70%
stenosis of the right posterior cerebral artery P1 segment.

PLAN:
Overall the patient is angiographic findings remain relatively
stable compared to the arteriogram August 09, 2017.

Also clinically the patient appears asymptomatic.

Follow-up with CT angiogram of the head and neck will be obtained in
approximately 6 months time.

Also should the patient develop any new neurological symptoms, he
was advised to call 911.

Patient expressed understanding and agreement with the above
management plan.

ADDENDUM:
This is an addendum on the diagnostic catheter arteriogram performed
on 11 08 2018

In the technical procedure section

Following replaces the previously dictated left subclavian artery
cannulization. New dictation is the left vertebral artery was also
selected.

The left vertebral artery was also selected.

*** End of Addendum ***
MEDICATIONS:
Heparin 6555 units intra-arterial. No antibiotic was administered
within 1 hour of the procedure.

ANESTHESIA/SEDATION:
Versed 1 mg IV; Fentanyl 25 mcg IV

Moderate Sedation Time:  30 minutes

The patient was continuously monitored during the procedure by the
interventional radiology nurse under my direct supervision.

CONTRAST:  Isovue 300 approximately 60 mL.

FLUOROSCOPY TIME:  Fluoroscopy Time: 8 minutes 30 seconds (920 mGy).

COMPLICATIONS:
None immediate.
The right forearm to the right wrist was prepped and draped in the
usual sterile fashion.

The right radial artery was palpated and identified with ultrasound.
Morphology of the radial artery was then evaluated and documented
with ultrasound. Thereafter, using ultrasound guidance, transradial
access into the right radial artery was then obtained using
micropuncture set. Over a 0.018 inch micro guidewire, a [DATE] French
radial sheath was then inserted. The obturator, and the wire were
removed. Good aspiration obtained from the side port of the radial
sheath. A cocktail of 2.5 mg of verapamil, 6555 units of heparin,
and 200 units of nitroglycerin was then injected in diluted form
without event.

A right radial artery arteriogram was then obtained.

Over a 0.035 inch Roadrunner guidewire, a 5 [employer] Berke 2
diagnostic catheter was then advanced to the aortic arch region, and
select cannulation was performed of the right vertebral artery, the
right common carotid artery, the left common carotid artery and the
left subclavian arteries.

Hemostasis in the right radial artery puncture site was then
obtained with a wrist band. Distal right radial pulse was verified.
FINDINGS: The origin of the right vertebral artery demonstrates just under 50%
narrowing.

The vessel is, otherwise, seen to opacify to the cranial skull base.
Wide patency is seen of the right vertebrobasilar junction and the
right posterior-inferior cerebellar artery.

The posterior-inferior cerebral artery demonstrates focal areas of
moderate narrowing in the proximal [DATE] very likely representing
intracranial arteriosclerosis.

Mild atherosclerotic disease is also noted along the right
vertebrobasilar junction.

The basilar artery, the posterior cerebral arteries, the superior
cerebellar arteries and the anterior-inferior cerebellar arteries
opacify into the capillary and venous phases.

There is an area of stenosis of approximately 70% involving the
right posterior cerebral artery P1 segment. Unopacified blood is
seen in the basilar artery from the contralateral vertebral artery.
The origin of the left vertebral artery is widely patent and
associated with moderate tortuosity proximally.

Otherwise, the vessel is seen to opacify to the cranial skull base.

There is approximately 50% stenosis of the left vertebrobasilar
junction proximal to the origin of the left posterior-inferior
cerebellar artery.

The opacified basilar artery, the posterior cerebral artery, the
superior cerebral arteries and the anterior-inferior cerebellar
arteries demonstrate flow through them. Again demonstrated is the
approximately 70% stenosis of the right posterior cerebral artery P1
segment.

The right common arteriogram demonstrates the right external carotid
artery and its major branches to be widely patent.

A stented segment of the distal right common carotid artery
extending into the right internal carotid artery demonstrates wide
patency without evidence of intra stent stenosis.

More distally the vessel is seen to opacify to the cranial skull
base. The distal cervical and the petrous segments demonstrate wide
patency.

There is approximately 50% stenosis of the proximal cavernous
segment of the right internal carotid artery.

More distally the supraclinoid segment is widely patent.

The right middle cerebral artery demonstrates approximately 50%
stenosis in the mid M1 segment secondary to a soft atherosclerotic
disease. Also on the oblique maximized views there is approximately
70% stenosis of the distal right M1 segment just proximal to the
trifurcation.

The right MCA trifurcation branches demonstrate wide patency.

The right anterior cerebral artery opacifies into the capillary and
venous phases. The left common carotid arteriogram demonstrates the
left external carotid artery and its major branches to be widely
patent.

The left internal carotid artery at the bulb demonstrates
approximately just under 50% stenosis. This is associated with at
least 2 outpouchings involving the posterior wall of the left
internal carotid artery measuring 3 mm x 3 mm, and the more proximal
one measuring at least 4 mm x 3 mm. More distally the left internal
carotid artery is seen to opacify to the cranial skull base.

Wide patency is seen of the petrous, the cavernous and the
supraclinoid segments.

Arising in the superior hypophyseal region of the left internal
carotid artery is approximately 4 mm x 3.1 mm saccular outpouching
probably representing an aneurysm.

The left middle cerebral artery and the left anterior cerebral
artery opacify into the capillary and venous phases.
IMPRESSION: Wide patency of the previously stented segment of the right internal
carotid artery proximally.

Approximately 50% stenosis of the right internal carotid artery
proximal cavernous segment.

Approximately 50% stenosis of the right MCA M1 segment, with
suspicion of a 70% stenosis of the distal right M1 segment.

Just under 50% stenosis of the origin of the right vertebral artery.

Approximately 50% stenosis of the left vertebrobasilar junction
proximal to the left posterior-inferior cerebellar artery.

Approximately 50% stenosis of the proximal left internal carotid
artery at the bulb associated with 2 focal outpouchings measuring 3
mm x 3 mm, and 3 mm x 4 mm which probably represent focal
ulcerations/saccular aneurysms associated with atherosclerotic
disease. Approximately 4.1 mm x 3 mm left internal carotid artery
superior hypophyseal region aneurysm.

Moderate intracranial arteriosclerosis of the right
posterior-inferior cerebellar artery, and also approximately 70%
stenosis of the right posterior cerebral artery P1 segment.

PLAN:
Overall the patient is angiographic findings remain relatively
stable compared to the arteriogram August 09, 2017.

Also clinically the patient appears asymptomatic.

Follow-up with CT angiogram of the head and neck will be obtained in
approximately 6 months time.

Also should the patient develop any new neurological symptoms, he
was advised to call 911.

Patient expressed understanding and agreement with the above
management plan.

## 2020-08-31 ENCOUNTER — Telehealth: Payer: Self-pay | Admitting: Podiatry

## 2020-08-31 NOTE — Telephone Encounter (Signed)
Received [employer] auth # [id number] for orthotics(L3020 X2) valid 7.25.2022 thru 10.24.2022.Marland KitchenMarland Kitchen

## 2020-09-10 ENCOUNTER — Other Ambulatory Visit: Payer: Self-pay

## 2020-09-10 ENCOUNTER — Other Ambulatory Visit: Payer: PPO

## 2020-09-10 ENCOUNTER — Telehealth: Payer: Self-pay | Admitting: *Deleted

## 2020-09-10 NOTE — Telephone Encounter (Signed)
PUO-09/10/20

## 2020-09-14 ENCOUNTER — Encounter: Payer: Self-pay | Admitting: Family Medicine

## 2020-09-28 ENCOUNTER — Other Ambulatory Visit: Payer: Self-pay | Admitting: Family Medicine

## 2020-10-01 ENCOUNTER — Other Ambulatory Visit: Payer: Self-pay

## 2020-10-01 DIAGNOSIS — K2270 Barrett's esophagus without dysplasia: Secondary | ICD-10-CM

## 2020-10-01 MED ORDER — PANTOPRAZOLE SODIUM 40 MG PO TBEC: 40.0000 mg | DELAYED_RELEASE_TABLET | Freq: Every day | ORAL | 2 refills | Status: DC

## 2020-10-06 ENCOUNTER — Other Ambulatory Visit: Payer: Self-pay | Admitting: Internal Medicine

## 2020-10-13 ENCOUNTER — Telehealth: Payer: Self-pay | Admitting: Pharmacist

## 2020-10-13 ENCOUNTER — Other Ambulatory Visit (HOSPITAL_COMMUNITY): Payer: Self-pay | Admitting: Physician Assistant

## 2020-10-13 DIAGNOSIS — I10 Essential (primary) hypertension: Secondary | ICD-10-CM

## 2020-10-13 MED ORDER — CLOPIDOGREL BISULFATE 75 MG PO TABS: 75.0000 mg | ORAL_TABLET | Freq: Every day | ORAL | 3 refills | Status: DC

## 2020-10-13 NOTE — Telephone Encounter (Signed)
Pt called clinic about his BP. Had been taking losartan '100mg'$  during the day and amlodipine '10mg'$  at night. States his BP was running low and he was feeling dizzy so he cut back the dose on both meds to take 1/2 tab of each and SBP ranges 110-120 now. He self-adjusts both BP meds every few months. He is aware BP goal is < 130/35mHg. I have updated med list.

## 2020-10-15 DIAGNOSIS — H903 Sensorineural hearing loss, bilateral: Secondary | ICD-10-CM | POA: Diagnosis not present

## 2020-10-27 DIAGNOSIS — Z08 Encounter for follow-up examination after completed treatment for malignant neoplasm: Secondary | ICD-10-CM | POA: Diagnosis not present

## 2020-10-27 DIAGNOSIS — C44311 Basal cell carcinoma of skin of nose: Secondary | ICD-10-CM | POA: Diagnosis not present

## 2020-10-27 DIAGNOSIS — D492 Neoplasm of unspecified behavior of bone, soft tissue, and skin: Secondary | ICD-10-CM | POA: Diagnosis not present

## 2020-10-27 DIAGNOSIS — Z85828 Personal history of other malignant neoplasm of skin: Secondary | ICD-10-CM | POA: Diagnosis not present

## 2020-11-04 ENCOUNTER — Other Ambulatory Visit: Payer: Self-pay | Admitting: *Deleted

## 2020-11-04 MED ORDER — CLOPIDOGREL BISULFATE 75 MG PO TABS: 75.0000 mg | ORAL_TABLET | Freq: Every day | ORAL | 0 refills | Status: DC

## 2020-11-11 ENCOUNTER — Encounter: Payer: Self-pay | Admitting: Family Medicine

## 2020-11-11 DIAGNOSIS — K2270 Barrett's esophagus without dysplasia: Secondary | ICD-10-CM

## 2020-11-17 ENCOUNTER — Encounter: Payer: Self-pay | Admitting: Family Medicine

## 2020-11-20 ENCOUNTER — Ambulatory Visit (INDEPENDENT_AMBULATORY_CARE_PROVIDER_SITE_OTHER): Payer: PPO

## 2020-11-20 ENCOUNTER — Other Ambulatory Visit: Payer: Self-pay

## 2020-11-20 ENCOUNTER — Ambulatory Visit: Payer: PPO | Admitting: Podiatry

## 2020-11-20 ENCOUNTER — Encounter: Payer: Self-pay | Admitting: Podiatry

## 2020-11-20 DIAGNOSIS — M722 Plantar fascial fibromatosis: Secondary | ICD-10-CM

## 2020-11-20 DIAGNOSIS — M109 Gout, unspecified: Secondary | ICD-10-CM

## 2020-11-20 DIAGNOSIS — R600 Localized edema: Secondary | ICD-10-CM

## 2020-11-20 DIAGNOSIS — M7661 Achilles tendinitis, right leg: Secondary | ICD-10-CM | POA: Diagnosis not present

## 2020-11-20 MED ORDER — TRIAMCINOLONE ACETONIDE 10 MG/ML IJ SUSP
10.0000 mg | Freq: Once | INTRAMUSCULAR | Status: AC
  Administered 2020-11-20: 10 mg

## 2020-11-20 NOTE — Progress Notes (Signed)
Subjective:   Patient ID: John Salazar, male   DOB: 75 y.o.   MRN: [id number]   HPI Patient presents with a lot of pain in the back of the right heel and states he started with pain in his foot and has developed a lot of swelling in the midfoot right with discomfort which has spread into his Achilles tendon   ROS      Objective:  Physical Exam  Neurovascular status intact negative Bevelyn Buckles' sign was noted exquisite discomfort into the right Achilles at the lateral side with inflammation noted with patient having midfoot +1 pitting edema discomfort in the midfoot and into the ankle itself     Assessment:  Several different problems 1 being inflammation of the Achilles tendon lateral side and secondarily edema with pitting component which may be due to structure or inflammatory systemic condition     Plan:  H&P reviewed both conditions separately discussed possibility for gout or any other kind of inflammatory condition with patient instructed what to do if any calf pain were to occur.  I did discuss injection of the heel patient wants this done I explained risk and possibility for rupture and I did sterile prep injected the lateral side of the Achilles 3 mg Dexasone Kenalog 5 mg Xylocaine keep it away from the center medial side advised on compression dispensed ankle compression stocking reappoint to recheck  X-rays indicate small spur no indication stress fracture or other pathology

## 2020-11-20 NOTE — Patient Instructions (Signed)
Gout Gout is painful swelling of your joints. Gout is a type of arthritis. It is caused by having too much uric acid in your body. Uric acid is a chemical that is made when your body breaks down substances called purines. If your body has too much uric acid, sharp crystals can form and build up in your joints. This causes pain and swelling. Gout attacks can happen quickly and be very painful (acute gout). Over time, the attacks can affect more joints and happen more often (chronic gout). What are the causes? Too much uric acid in your blood. This can happen because: Your kidneys do not remove enough uric acid from your blood. Your body makes too much uric acid. You eat too many foods that are high in purines. These foods include organ meats, some seafood, and beer. Trauma or stress. What increases the risk? Having a family history of gout. Being male and middle-aged. Being male and having gone through menopause. Being very overweight (obese). Drinking alcohol, especially beer. Not having enough water in the body (being dehydrated). Losing weight too quickly. Having an organ transplant. Having lead poisoning. Taking certain medicines. Having kidney disease. Having a skin condition called psoriasis. What are the signs or symptoms? An attack of acute gout usually happens in just one joint. The most common place is the big toe. Attacks often start at night. Other joints that may be affected include joints of the feet, ankle, knee, fingers, wrist, or elbow. Symptoms of an attack may include: Very bad pain. Warmth. Swelling. Stiffness. Shiny, red, or purple skin. Tenderness. The affected joint may be very painful to touch. Chills and fever. Chronic gout may cause symptoms more often. More joints may be involved. You may also have white or yellow lumps (tophi) on your hands or feet or in other areas near your joints. How is this treated? Treatment for this condition has two phases:  treating an acute attack and preventing future attacks. Acute gout treatment may include: NSAIDs. Steroids. These are taken by mouth or injected into a joint. Colchicine. This medicine relieves pain and swelling. It can be given by mouth or through an IV tube. Preventive treatment may include: Taking small doses of NSAIDs or colchicine daily. Using a medicine that reduces uric acid levels in your blood. Making changes to your diet. You may need to see a food expert (dietitian) about what to eat and drink to prevent gout. Follow these instructions at home: During a gout attack  If told, put ice on the painful area: Put ice in a plastic bag. Place a towel between your skin and the bag. Leave the ice on for 20 minutes, 2-3 times a day. Raise (elevate) the painful joint above the level of your heart as often as you can. Rest the joint as much as possible. If the joint is in your leg, you may be given crutches. Follow instructions from your doctor about what you cannot eat or drink. Avoiding future gout attacks Eat a low-purine diet. Avoid foods and drinks such as: Liver. Kidney. Anchovies. Asparagus. Herring. Mushrooms. Mussels. Beer. Stay at a healthy weight. If you want to lose weight, talk with your doctor. Do not lose weight too fast. Start or continue an exercise plan as told by your doctor. Eating and drinking Drink enough fluids to keep your pee (urine) pale yellow. If you drink alcohol: Limit how much you use to: 0-1 drink a day for women. 0-2 drinks a day for men. Be aware of   how much alcohol is in your drink. In the [place] one drink equals one 12 oz bottle of beer (355 mL), one 5 oz glass of wine (148 mL), or one 1 oz glass of hard liquor (44 mL). General instructions Take over-the-counter and prescription medicines only as told by your doctor. Do not drive or use heavy machinery while taking prescription pain medicine. Return to your normal activities as told by your  doctor. Ask your doctor what activities are safe for you. Keep all follow-up visits as told by your doctor. This is important. Contact a doctor if: You have another gout attack. You still have symptoms of a gout attack after 10 days of treatment. You have problems (side effects) because of your medicines. You have chills or a fever. You have burning pain when you pee (urinate). You have pain in your lower back or belly. Get help right away if: You have very bad pain. Your pain cannot be controlled. You cannot pee. Summary Gout is painful swelling of the joints. The most common site of pain is the big toe, but it can affect other joints. Medicines and avoiding some foods can help to prevent and treat gout attacks. This information is not intended to replace advice given to you by your health care provider. Make sure you discuss any questions you have with your health care provider. Document Revised: 08/16/2017 Document Reviewed: 08/16/2017 Elsevier Patient Education  2022 [employer]  

## 2020-11-23 ENCOUNTER — Ambulatory Visit (INDEPENDENT_AMBULATORY_CARE_PROVIDER_SITE_OTHER): Payer: PPO | Admitting: Family Medicine

## 2020-11-23 ENCOUNTER — Other Ambulatory Visit: Payer: Self-pay

## 2020-11-23 VITALS — BP 118/78 | HR 84 | Temp 97.0°F | Wt 171.5 lb

## 2020-11-23 DIAGNOSIS — I10 Essential (primary) hypertension: Secondary | ICD-10-CM | POA: Diagnosis not present

## 2020-11-23 DIAGNOSIS — K2270 Barrett's esophagus without dysplasia: Secondary | ICD-10-CM | POA: Diagnosis not present

## 2020-11-23 DIAGNOSIS — M25571 Pain in right ankle and joints of right foot: Secondary | ICD-10-CM | POA: Diagnosis not present

## 2020-11-23 DIAGNOSIS — Z23 Encounter for immunization: Secondary | ICD-10-CM | POA: Diagnosis not present

## 2020-11-23 DIAGNOSIS — K529 Noninfective gastroenteritis and colitis, unspecified: Secondary | ICD-10-CM

## 2020-11-23 DIAGNOSIS — R197 Diarrhea, unspecified: Secondary | ICD-10-CM | POA: Insufficient documentation

## 2020-11-23 LAB — CBC
HCT: 38.9 % — ABNORMAL LOW (ref 39.0–52.0)
Hemoglobin: 13.1 g/dL (ref 13.0–17.0)
MCHC: 33.5 g/dL (ref 30.0–36.0)
MCV: 88.9 fl (ref 78.0–100.0)
Platelets: 174 10*3/uL (ref 150.0–400.0)
RBC: 4.38 Mil/uL (ref 4.22–5.81)
RDW: 13.5 % (ref 11.5–15.5)
WBC: 4.9 10*3/uL (ref 4.0–10.5)

## 2020-11-23 LAB — COMPREHENSIVE METABOLIC PANEL
ALT: 17 U/L (ref 0–53)
AST: 20 U/L (ref 0–37)
Albumin: 4.4 g/dL (ref 3.5–5.2)
Alkaline Phosphatase: 74 U/L (ref 39–117)
BUN: 22 mg/dL (ref 6–23)
CO2: 28 mEq/L (ref 19–32)
Calcium: 9.6 mg/dL (ref 8.4–10.5)
Chloride: 104 mEq/L (ref 96–112)
Creatinine, Ser: 1.68 mg/dL — ABNORMAL HIGH (ref 0.40–1.50)
GFR: 39.32 mL/min — ABNORMAL LOW (ref >60.00)
Glucose, Bld: 60 mg/dL — ABNORMAL LOW (ref 70–99)
Potassium: 4 mEq/L (ref 3.5–5.1)
Sodium: 139 mEq/L (ref 135–145)
Total Bilirubin: 0.5 mg/dL (ref 0.2–1.2)
Total Protein: 6.9 g/dL (ref 6.0–8.3)

## 2020-11-23 LAB — URIC ACID: Uric Acid, Serum: 4.9 mg/dL (ref 4.0–7.8)

## 2020-11-23 LAB — TSH: TSH: 2.43 u[IU]/mL (ref 0.35–5.50)

## 2020-11-23 MED ORDER — ESOMEPRAZOLE MAGNESIUM 20 MG PO CPDR: 20.0000 mg | DELAYED_RELEASE_CAPSULE | Freq: Every day | ORAL | 0 refills | Status: DC

## 2020-11-23 NOTE — Assessment & Plan Note (Signed)
BP controlled. Cont amlodipine 10 mg, losartan 100 mg,

## 2020-11-23 NOTE — Progress Notes (Signed)
Subjective:     John Salazar is a 75 y.o. male presenting for Abdominal Pain (Waiting to get in with GI ), Bloated, and Gas     HPI  #Gas/bloated - was following with [facility] - is working to get referral to new GI provider - hx of barrett's esophagus  - bloating and gas - will have gas and noise - in the abdomen - very loud - cannot sit w/o people noticing - trouble laying flat at night - has to sit up and burp - will go back and forth between diarrhea and constipation - takes 1/2 imodium which will stop the diarrhea  - generally has diarrhea - but the imodium does work - imodium does have calm down the gas/bloating symptoms - had dicyclomine - but relaxes everything - does not particularly like this  Told to not drink coffee, juice,   Review of Systems   Social History   Tobacco Use  Smoking Status Former   Packs/day: 2.00   Years: 25.00   Pack years: 50.00   Types: Cigarettes   Quit date: 03/10/1996   Years since quitting: 24.7  Smokeless Tobacco Never        Objective:    BP Readings from Last 3 Encounters:  11/23/20 118/78  06/30/20 (!) 141/74  05/19/20 132/70   Wt Readings from Last 3 Encounters:  11/23/20 171 lb 8 oz (77.8 kg)  06/29/20 170 lb (77.1 kg)  05/18/20 170 lb (77.1 kg)    BP 118/78   Pulse 84   Temp (!) 97 F (36.1 C) (Temporal)   Wt 171 lb 8 oz (77.8 kg)   SpO2 100%   BMI 25.33 kg/m    Physical Exam Constitutional:      Appearance: Normal appearance. He is not ill-appearing or diaphoretic.  HENT:     Right Ear: External ear normal.     Left Ear: External ear normal.     Nose: Nose normal.  Eyes:     General: No scleral icterus.    Extraocular Movements: Extraocular movements intact.     Conjunctiva/sclera: Conjunctivae normal.  Cardiovascular:     Rate and Rhythm: Normal rate and regular rhythm.  Pulmonary:     Effort: Pulmonary effort is normal.     Breath sounds: Normal breath sounds.  Abdominal:     General:  Abdomen is flat. Bowel sounds are normal.     Palpations: Abdomen is soft.     Tenderness: There is no abdominal tenderness. There is no guarding or rebound.  Musculoskeletal:     Cervical back: Neck supple.  Skin:    General: Skin is warm and dry.  Neurological:     Mental Status: He is alert. Mental status is at baseline.  Psychiatric:        Mood and Affect: Mood normal.          Assessment & Plan:   Problem List Items Addressed This Visit       Cardiovascular and Mediastinum   Benign essential HTN    BP controlled. Cont amlodipine 10 mg, losartan 100 mg,         Digestive   Barrett esophagus    Worsening symptoms. Referral to new GI for second opinion. Trial of nexium instead of pantoprazole. Update in 1-2 weeks      Relevant Medications   esomeprazole (NEXIUM) 20 MG capsule   Chronic diarrhea - Primary    Symptoms for years. Takes prn imodium but still  getting frequent symptoms. Normal colonoscopy but not sure if he has ever had celiac testing. Low FODMAP diet provided for suspected IBS. Continue to take imodium and GI referral for second opinion      Relevant Orders   TSH   CBC   Comprehensive metabolic panel   Celiac Pnl 2 rflx Endomysial Ab Ttr     Other   Arthralgia of right foot    Recent injection in heel. Podiatry mentioned it could be gout. Will check uric acid      Relevant Orders   Uric Acid   Other Visit Diagnoses     Need for influenza vaccination       Relevant Orders   Flu Vaccine QUAD High Dose(Fluad) (Completed)        Return if symptoms worsen or fail to improve.  Lesleigh Noe, MD  This visit occurred during the SARS-CoV-2 public health emergency.  Safety protocols were in place, including screening questions prior to the visit, additional usage of staff PPE, and extensive cleaning of exam room while observing appropriate contact time as indicated for disinfecting solutions.

## 2020-11-23 NOTE — Patient Instructions (Addendum)
Gas - labs - diet below - GI referral  Heartburn - stop pantoprazole - start Nexium if worsening can increase to 2 tablets - if nexium not better controlled than pantoprazole after 2 weeks than stop   Low-FODMAP Eating Plan FODMAP stands for fermentable oligosaccharides, disaccharides, monosaccharides, and polyols. These are sugars that are hard for some people to digest. A low-FODMAP eating plan may help some people who have irritable bowel syndrome (IBS) and certain other bowel (intestinal) diseases to manage their symptoms. This meal plan can be complicated to follow. Work with a diet and nutrition specialist (dietitian) to make a low-FODMAP eating plan that is right for you. A dietitian can help make sure that you get enough nutrition from this diet. What are tips for following this plan? Reading food labels Check labels for hidden FODMAPs such as: High-fructose syrup. Honey. Agave. Natural fruit flavors. Onion or garlic powder. Choose low-FODMAP foods that contain 3-4 grams of fiber per serving. Check food labels for serving sizes. Eat only one serving at a time to make sure FODMAP levels stay low. Shopping Shop with a list of foods that are recommended on this diet and make a meal plan. Meal planning Follow a low-FODMAP eating plan for up to 6 weeks, or as told by your health care provider or dietitian. To follow the eating plan: Eliminate high-FODMAP foods from your diet completely. Choose only low-FODMAP foods to eat. You will do this for 2-6 weeks. Gradually reintroduce high-FODMAP foods into your diet one at a time. Most people should wait a few days before introducing the next new high-FODMAP food into their meal plan. Your dietitian can recommend how quickly you may reintroduce foods. Keep a daily record of what and how much you eat and drink. Make note of any symptoms that you have after eating. Review your daily record with a dietitian regularly to identify which foods  you can eat and which foods you should avoid. General tips Drink enough fluid each day to keep your urine pale yellow. Avoid processed foods. These often have added sugar and may be high in FODMAPs. Avoid most dairy products, whole grains, and sweeteners. Work with a dietitian to make sure you get enough fiber in your diet. Avoid high FODMAP foods at meals to manage symptoms. Recommended foods Fruits Bananas, oranges, tangerines, lemons, limes, blueberries, raspberries, strawberries, grapes, cantaloupe, honeydew melon, kiwi, papaya, passion fruit, and pineapple. Limited amounts of dried cranberries, banana chips, and shredded coconut. Vegetables Eggplant, zucchini, cucumber, peppers, green beans, bean sprouts, lettuce, arugula, kale, Swiss chard, spinach, collard greens, bok choy, summer squash, potato, and tomato. Limited amounts of corn, carrot, and sweet potato. Green parts of scallions. Grains Gluten-free grains, such as rice, oats, buckwheat, quinoa, corn, polenta, and millet. Gluten-free pasta, bread, or cereal. Rice noodles. Corn tortillas. Meats and other proteins Unseasoned beef, pork, poultry, or fish. Eggs. Berniece Salines. Tofu (firm) and tempeh. Limited amounts of nuts and seeds, such as almonds, walnuts, [place] nuts, pecans, peanuts, nut butters, pumpkin seeds, chia seeds, and sunflower seeds. Dairy Lactose-free milk, yogurt, and kefir. Lactose-free cottage cheese and ice cream. Non-dairy milks, such as almond, coconut, hemp, and rice milk. Non-dairy yogurt. Limited amounts of goat cheese, brie, mozzarella, parmesan, swiss, and other hard cheeses. Fats and oils Butter-free spreads. Vegetable oils, such as olive, canola, and sunflower oil. Seasoning and other foods Artificial sweeteners with names that do not end in "ol," such as aspartame, saccharine, and stevia. Maple syrup, white table sugar, raw sugar, brown  sugar, and molasses. Mayonnaise, soy sauce, and tamari. Fresh basil, coriander,  parsley, rosemary, and thyme. Beverages Water and mineral water. Sugar-sweetened soft drinks. Small amounts of orange juice or cranberry juice. Black and green tea. Most dry wines. Coffee. The items listed above may not be a complete list of foods and beverages you can eat. Contact a dietitian for more information. Foods to avoid Fruits Fresh, dried, and juiced forms of apple, pear, watermelon, peach, plum, cherries, apricots, blackberries, boysenberries, figs, nectarines, and mango. Avocado. Vegetables Chicory root, artichoke, asparagus, cabbage, snow peas, Brussels sprouts, broccoli, sugar snap peas, mushrooms, celery, and cauliflower. Onions, garlic, leeks, and the white part of scallions. Grains Wheat, including kamut, durum, and semolina. Barley and bulgur. Couscous. Wheat-based cereals. Wheat noodles, bread, crackers, and pastries. Meats and other proteins Fried or fatty meat. Sausage. Cashews and pistachios. Soybeans, baked beans, black beans, chickpeas, kidney beans, fava beans, navy beans, lentils, black-eyed peas, and split peas. Dairy Milk, yogurt, ice cream, and soft cheese. Cream and sour cream. Milk-based sauces. Custard. Buttermilk. Soy milk. Seasoning and other foods Any sugar-free gum or candy. Foods that contain artificial sweeteners such as sorbitol, mannitol, isomalt, or xylitol. Foods that contain honey, high-fructose corn syrup, or agave. Bouillon, vegetable stock, beef stock, and chicken stock. Garlic and onion powder. Condiments made with onion, such as hummus, chutney, pickles, relish, salad dressing, and salsa. Tomato paste. Beverages Chicory-based drinks. Coffee substitutes. Chamomile tea. Fennel tea. Sweet or fortified wines such as port or sherry. Diet soft drinks made with isomalt, mannitol, maltitol, sorbitol, or xylitol. Apple, pear, and mango juice. Juices with high-fructose corn syrup. The items listed above may not be a complete list of foods and beverages you  should avoid. Contact a dietitian for more information. Summary FODMAP stands for fermentable oligosaccharides, disaccharides, monosaccharides, and polyols. These are sugars that are hard for some people to digest. A low-FODMAP eating plan is a short-term diet that helps to ease symptoms of certain bowel diseases. The eating plan usually lasts up to 6 weeks. After that, high-FODMAP foods are reintroduced gradually and one at a time. This can help you find out which foods may be causing symptoms. A low-FODMAP eating plan can be complicated. It is best to work with a dietitian who has experience with this type of plan. This information is not intended to replace advice given to you by your health care provider. Make sure you discuss any questions you have with your health care provider. Document Revised: 06/13/2019 Document Reviewed: 06/13/2019 Elsevier Patient Education  [employer].

## 2020-11-23 NOTE — Assessment & Plan Note (Signed)
Worsening symptoms. Referral to new GI for second opinion. Trial of nexium instead of pantoprazole. Update in 1-2 weeks

## 2020-11-23 NOTE — Assessment & Plan Note (Signed)
Recent injection in heel. Podiatry mentioned it could be gout. Will check uric acid

## 2020-11-23 NOTE — Assessment & Plan Note (Addendum)
Symptoms for years. Takes prn imodium but still getting frequent symptoms. Normal colonoscopy but not sure if he has ever had celiac testing. Low FODMAP diet provided for suspected IBS. Continue to take imodium and GI referral for second opinion

## 2020-12-02 LAB — CELIAC PNL 2 RFLX ENDOMYSIAL AB TTR
(tTG) Ab, IgA: <1 U/mL
(tTG) Ab, IgG: <1 U/mL
Endomysial Ab IgA: NEGATIVE
Gliadin IgA: 1.4 U/mL
Gliadin IgG: <1 U/mL
Immunoglobulin A: 259 mg/dL (ref 70–320)

## 2020-12-04 ENCOUNTER — Encounter: Payer: Self-pay | Admitting: Gastroenterology

## 2020-12-07 ENCOUNTER — Telehealth: Payer: Self-pay | Admitting: Family Medicine

## 2020-12-07 NOTE — Chronic Care Management (AMB) (Signed)
  Chronic Care Management   Outreach Note  12/07/2020 Name: John Salazar MRN: [id number] DOB: Jul 28, 1945  Referred by: Lesleigh Noe, MD Reason for referral : No chief complaint on file.   An unsuccessful telephone outreach was attempted today. The patient was referred to the pharmacist for assistance with care management and care coordination.   Follow Up Plan:   Tatjana Dellinger [employer] Scheduler

## 2020-12-07 NOTE — Progress Notes (Signed)
  Chronic Care Management   Note  12/07/2020 Name: John Salazar MRN: [id number] DOB: [birth year]  John Salazar is a 75 y.o. year old male who is a primary care patient of Einar Pheasant, Jobe Marker, MD. I reached out to Karen Kitchens by phone today in response to a referral sent by John Salazar's PCP, Lesleigh Noe, MD.   John Salazar was given information about Chronic Care Management services today including:  CCM service includes personalized support from designated clinical staff supervised by his physician, including individualized plan of care and coordination with other care providers 24/7 contact phone numbers for assistance for urgent and routine care needs. Service will only be billed when office clinical staff spend 20 minutes or more in a month to coordinate care. Only one practitioner may furnish and bill the service in a calendar month. The patient may stop CCM services at any time (effective at the end of the month) by phone call to the office staff.   Patient agreed to services and verbal consent obtained.   Follow up plan:   John Salazar Secretary/administrator

## 2020-12-08 ENCOUNTER — Other Ambulatory Visit (HOSPITAL_COMMUNITY): Payer: Self-pay | Admitting: Interventional Radiology

## 2020-12-08 DIAGNOSIS — I771 Stricture of artery: Secondary | ICD-10-CM

## 2020-12-13 ENCOUNTER — Encounter: Payer: Self-pay | Admitting: Family Medicine

## 2020-12-14 ENCOUNTER — Other Ambulatory Visit: Payer: Self-pay

## 2020-12-14 DIAGNOSIS — K2270 Barrett's esophagus without dysplasia: Secondary | ICD-10-CM

## 2020-12-14 MED ORDER — ESOMEPRAZOLE MAGNESIUM 20 MG PO CPDR: 20.0000 mg | DELAYED_RELEASE_CAPSULE | Freq: Every day | ORAL | 0 refills | Status: DC

## 2020-12-15 ENCOUNTER — Ambulatory Visit (HOSPITAL_COMMUNITY)
Admission: RE | Admit: 2020-12-15 | Discharge: 2020-12-15 | Disposition: A | Discharge status: Home / Self Care | Payer: PPO | Source: Ambulatory Visit | Attending: Interventional Radiology | Admitting: Interventional Radiology

## 2020-12-15 ENCOUNTER — Other Ambulatory Visit: Payer: Self-pay

## 2020-12-15 DIAGNOSIS — I771 Stricture of artery: Secondary | ICD-10-CM | POA: Diagnosis not present

## 2020-12-15 DIAGNOSIS — I6623 Occlusion and stenosis of bilateral posterior cerebral arteries: Secondary | ICD-10-CM | POA: Diagnosis not present

## 2020-12-15 DIAGNOSIS — I6601 Occlusion and stenosis of right middle cerebral artery: Secondary | ICD-10-CM | POA: Diagnosis not present

## 2020-12-15 DIAGNOSIS — I720 Aneurysm of carotid artery: Secondary | ICD-10-CM | POA: Diagnosis not present

## 2020-12-15 DIAGNOSIS — I672 Cerebral atherosclerosis: Secondary | ICD-10-CM | POA: Diagnosis not present

## 2020-12-15 MED ORDER — IOHEXOL 350 MG/ML SOLN
75.0000 mL | Freq: Once | INTRAVENOUS | Status: AC | PRN
  Administered 2020-12-15: 60 mL via INTRAVENOUS

## 2020-12-16 ENCOUNTER — Ambulatory Visit: Payer: PPO | Admitting: Podiatry

## 2020-12-16 ENCOUNTER — Encounter: Payer: Self-pay | Admitting: Podiatry

## 2020-12-16 DIAGNOSIS — M7661 Achilles tendinitis, right leg: Secondary | ICD-10-CM

## 2020-12-16 DIAGNOSIS — M7662 Achilles tendinitis, left leg: Secondary | ICD-10-CM

## 2020-12-16 MED ORDER — TRIAMCINOLONE ACETONIDE 10 MG/ML IJ SUSP
10.0000 mg | Freq: Once | INTRAMUSCULAR | Status: AC
  Administered 2020-12-16: 10 mg

## 2020-12-16 NOTE — Progress Notes (Signed)
Subjective:   Patient ID: John Salazar, male   DOB: 75 y.o.   MRN: [id number]   HPI Patient states the right heel bothers him and he feels like he needs something to stretch the heel and the back of the left heel has become sore on the outside and he is looking for treatment likely done on his right neuro   ROS      Objective:  Physical Exam  Vascular status intact with patient found to have inflammation posterior heel that is acute left lateral side and on the right it is chronic and patient's not able to properly stretch for get pressure off of it     Assessment:  Acute Achilles tendinitis left lateral side along with chronic Achilles tendinitis right     Plan:  H&P reviewed conditions for the left I did discuss injection explaining risks and he wants to pursue this and I did careful sterile prep injected the lateral side 3 mg dexamethasone Kenalog and did a careful injection keeping it away from the thick center or medial side of the Achilles tendon and advised on reduced activity and for the right I dispensed night splint along with advice for heat ice therapy and education rendered today

## 2020-12-18 ENCOUNTER — Encounter: Payer: Self-pay | Admitting: Gastroenterology

## 2020-12-18 ENCOUNTER — Ambulatory Visit (INDEPENDENT_AMBULATORY_CARE_PROVIDER_SITE_OTHER): Payer: PPO | Admitting: Gastroenterology

## 2020-12-18 ENCOUNTER — Other Ambulatory Visit: Payer: PPO

## 2020-12-18 VITALS — BP 128/62 | HR 64 | Ht 69.0 in | Wt 170.0 lb

## 2020-12-18 DIAGNOSIS — R140 Abdominal distension (gaseous): Secondary | ICD-10-CM

## 2020-12-18 DIAGNOSIS — R1013 Epigastric pain: Secondary | ICD-10-CM | POA: Diagnosis not present

## 2020-12-18 NOTE — Progress Notes (Signed)
HPI : John Salazar is a very pleasant 75 year old male with a history of cerebral aneurysm, coronary artery disease and carotid stenosis status post stenting who is referred to [place] by Dr. [name] for further evaluation of chronic abdominal bloating and indigestion.  The patient had previously been followed by [facility] for many years, but desired to switch to a GI provider within the [facility] health system.  The patient has been bothered by chronic abdominal bloating, indigestion and excessive foul-smelling flatulence for many months now.  He states that his gas is very distressing to him, and requires him to frequently leave the room when he is in the company of other people.  He reports generalized abdominal discomfort more prominent after meals. He is also bothered by persistent loose and poorly formed stools.  He usually has about 4 bowel movements per day.  Stools are typically pudding-like in consistency.  No problems with significant urgency or incontinence.  No nocturnal bowel movements.  No blood in the stool.  Constipation is not a problem for him.  His weight has been stable. His reflux symptoms are well controlled with once daily Nexium.  No dysphagia.  Previously he had been taking Protonix, which seemed to stop working for him. He is try taking dicyclomine for his bloating, but this makes him very sleepy and out of it so he does not take it.  He was recently tested for celiac disease, which was negative.  He tried taking multiple different probiotic strains, but each one worsened his diarrhea. His last colonoscopy was in 2020 and multiple small rectal polyps were removed.  He was recommended to repeat colonoscopy in 5 years. The patient has a reported history of Barrett's esophagus, but his most recent upper endoscopy in 2016 did not show any evidence of Barrett's.  Past Medical History:  Diagnosis Date   Anxiety    Barrett esophagus    BPH (benign prostatic hyperplasia)    Cerebral  aneurysm    Chronic kidney disease    stage 2   Colon polyps    Coronary artery disease    mild-mod non-obstructive CAD   Erectile dysfunction    GERD (gastroesophageal reflux disease)    History of kidney stones    History of stomach ulcers    Hypercholesteremia    Hypertension    Positive TB test    in the past     Past Surgical History:  Procedure Laterality Date   APPENDECTOMY  1970   CATARACT EXTRACTION W/ INTRAOCULAR LENS  IMPLANT, BILATERAL     COLONOSCOPY  10/2012   ESOPHAGOGASTRODUODENOSCOPY ENDOSCOPY  10/2012   HERNIA REPAIR     IR 3D INDEPENDENT WKST  04/14/2020   IR ANGIO INTRA EXTRACRAN SEL COM CAROTID INNOMINATE BILAT MOD SED  08/09/2017   IR ANGIO INTRA EXTRACRAN SEL COM CAROTID INNOMINATE BILAT MOD SED  11/08/2018   IR ANGIO INTRA EXTRACRAN SEL COM CAROTID INNOMINATE BILAT MOD SED  04/14/2020   IR ANGIO INTRA EXTRACRAN SEL COM CAROTID INNOMINATE UNI L MOD SED  06/29/2020   IR ANGIO INTRA EXTRACRAN SEL INTERNAL CAROTID UNI L MOD SED  05/20/2020   IR ANGIO VERTEBRAL SEL VERTEBRAL BILAT MOD SED  08/09/2017   IR ANGIO VERTEBRAL SEL VERTEBRAL BILAT MOD SED  11/08/2018   IR ANGIO VERTEBRAL SEL VERTEBRAL BILAT MOD SED  04/14/2020   IR ANGIOGRAM FOLLOW UP STUDY  05/20/2020   IR INTRAVSC STENT CERV CAROTID W/EMB-PROT MOD SED INCL ANGIO  08/23/2017  IR INTRAVSC STENT CERV CAROTID W/EMB-PROT MOD SED INCL ANGIO  06/29/2020   IR RADIOLOGIST EVAL & MGMT  05/05/2020   IR RADIOLOGIST EVAL & MGMT  06/03/2020   IR RADIOLOGIST EVAL & MGMT  07/15/2020   IR TRANSCATH/EMBOLIZ  05/18/2020   IR US GUIDE VASC ACCESS RIGHT  04/14/2020   IR US GUIDE VASC ACCESS RIGHT  05/20/2020   IR US GUIDE VASC ACCESS RIGHT  06/29/2020   LEFT HEART CATH AND CORONARY ANGIOGRAPHY N/A 11/15/2018   Procedure: LEFT HEART CATH AND CORONARY ANGIOGRAPHY;  Surgeon: Nelva Bush, MD;  Location: [facility];  Service: Cardiovascular;  Laterality: N/A;   PROSTATE SURGERY  1999   20 years ago, enlarged prostate   RADIOLOGY  WITH ANESTHESIA N/A 08/23/2017   Procedure: IR WITH ANESTHESIA STENT PLACEMENT;  Surgeon: Luanne Bras, MD;  Location: [place];  Service: Radiology;  Laterality: N/A;   RADIOLOGY WITH ANESTHESIA N/A 05/18/2020   Procedure: IR WITH ANESTHESIA EMBOLIZATION;  Surgeon: Luanne Bras, MD;  Location: [place];  Service: Radiology;  Laterality: N/A;   RADIOLOGY WITH ANESTHESIA Left 06/29/2020   Procedure: RADIOLOGY WITH ANESTHESIA  LEFT CAROTID STENT PLACEMENT;  Surgeon: Luanne Bras, MD;  Location: [place];  Service: Radiology;  Laterality: Left;   Family History  Problem Relation Age of Onset   Stroke Mother 51   Aneurysm Father 73   Social History   Tobacco Use   Smoking status: Former    Packs/day: 2.00    Years: 25.00    Pack years: 50.00    Types: Cigarettes    Quit date: 03/10/1996    Years since quitting: 24.7   Smokeless tobacco: Never  Vaping Use   Vaping Use: Never used  Substance Use Topics   Alcohol use: No    Alcohol/week: 0.0 standard drinks    Comment: in the past drank, quit 1993   Drug use: No   Current Outpatient Medications  Medication Sig Dispense Refill   acetaminophen (TYLENOL) 325 MG tablet Take 650 mg by mouth every 6 (six) hours as needed (pain).     amLODipine (NORVASC) 10 MG tablet Take 0.5 tablets (5 mg total) by mouth daily.     aspirin EC 81 MG tablet Take 81 mg by mouth in the morning.     clopidogrel (PLAVIX) 75 MG tablet Take 1 tablet (75 mg total) by mouth daily. 30 tablet 0   escitalopram (LEXAPRO) 10 MG tablet TAKE 1 TABLET ONCE DAILY. 90 tablet 1   esomeprazole (NEXIUM) 20 MG capsule Take 1 capsule (20 mg total) by mouth daily at 12 noon. 90 capsule 0   isosorbide mononitrate (IMDUR) 60 MG 24 hr tablet TAKE 1 TABLET BY MOUTH DAILY. 90 tablet 2   losartan (COZAAR) 100 MG tablet Take 0.5 tablets (50 mg total) by mouth daily.     rosuvastatin (CRESTOR) 40 MG tablet Take 1 tablet (40 mg total) by mouth daily. (Patient taking differently: Take  40 mg by mouth at bedtime.) 90 tablet 3   tamsulosin (FLOMAX) 0.4 MG CAPS capsule TAKE 1 CAPSULE 30 MINUTES AFTER THE SAME MEAL EACH DAY ONCE DAY. (Patient taking differently: Take 0.4 mg by mouth at bedtime.) 90 capsule 3   nitroGLYCERIN (NITROSTAT) 0.4 MG SL tablet Place 1 tablet (0.4 mg total) under the tongue every 5 (five) minutes as needed for chest pain. 25 tablet prn   No current facility-administered medications for this visit.   Allergies  Allergen Reactions   Bee Venom  Anaphylaxis and Shortness Of Breath   Penicillins Rash    PATIENT HAS HAD A PCN REACTION WITH IMMEDIATE RASH, FACIAL/TONGUE/THROAT SWELLING, SOB, OR LIGHTHEADEDNESS WITH HYPOTENSION:  #  #  YES  #  #  Has patient had a PCN reaction causing severe rash involving mucus membranes or skin necrosis: no Has patient had a PCN reaction that required hospitalization: no Has patient had a PCN reaction occurring within the last 10 years: no      Review of Systems: All systems reviewed and negative except where noted in HPI.    CT ANGIO HEAD W OR WO CONTRAST  Result Date: 12/16/2020 CLINICAL DATA:  History of aneurysm repair in 2022, carotid artery stent placed EXAM: CT ANGIOGRAPHY HEAD AND NECK TECHNIQUE: Multidetector CT imaging of the head and neck was performed using the standard protocol during bolus administration of intravenous contrast. Multiplanar CT image reconstructions and MIPs were obtained to evaluate the vascular anatomy. Carotid stenosis measurements (when applicable) are obtained utilizing NASCET criteria, using the distal internal carotid diameter as the denominator. CONTRAST:  71mL OMNIPAQUE IOHEXOL 350 MG/ML SOLN COMPARISON:  06/13/2019 FINDINGS: CT HEAD FINDINGS Brain: No evidence of acute infarction, hemorrhage, cerebral edema, mass, mass effect, or midline shift. No hydrocephalus orextra-axial fluid collection. Periventricular white matter changes, likely the sequela of chronic small vessel ischemic  disease. Vascular: Please see CTA findings below. Skull: Normal. Negative for fracture or focal lesion. Sinuses/Orbits: No acute finding. Status post bilateral lens replacements. Other: The mastoid air cells are well aerated. CTA NECK FINDINGS Aortic arch: Standard branching. Imaged portion shows no evidence of aneurysm or dissection. No significant stenosis of the major arch vessel origins. Aortic atherosclerotic disease, extending into the branch vessels. Right carotid system: Status post stenting; the stent is patent with good flow. No evidence of dissection, stenosis, or occlusion. Left carotid system: Status post interval stenting; the stent is patent with good flow. No evidence of dissection, stenosis, or occlusion. Vertebral arteries: Interval increase in calcified and noncalcified plaque at the origin of the left vertebral artery, now moderate to severe (series 12, images 86-93). The left vertebral artery is otherwise patent. Mild-to-moderate narrowing at the origin of the right vertebral artery (series 12, image 90), which has progressed from the prior exam. The right vertebral artery is otherwise patent. Codominant system. Skeleton: No acute osseous abnormality. Redemonstrated degenerative changes in the cervical spine. Other neck: Negative Upper chest: No focal pulmonary opacity or pleural effusion. Centrilobular emphysema. Review of the MIP images confirms the above findings CTA HEAD FINDINGS Anterior circulation: Interval placement of a left supraclinoid ICA stent, which appears patent. Redemonstrated atherosclerotic calcification and mild stenosis of the cavernous right internal carotid artery. Both internal carotid arteries are otherwise patent to the termini. Minimal dilatation approximating the expected origin of the right posterior communicating artery, without definite aneurysm (series 12, image 265). A1 segments patent. Normal anterior communicating artery. Anterior cerebral arteries are patent  to their distal aspects. Unchanged mild to moderate stenosis of the right M1 segment. No left M1 stenosis or occlusion. Normal MCA bifurcations. Distal MCA branches perfused and symmetric. Posterior circulation: Vertebral arteries widely patent to the vertebrobasilar junction without stenosis. Posterior inferior cerebral arteries patent bilaterally. Basilar patent to its distal aspect. Superior cerebral arteries patent bilaterally. Redemonstrated severe focal stenosis in the proximal right posterior cerebral artery. Moderate stenosis in the mid and distal left posterior cerebral artery. The bilateral posterior communicating arteries are not definitively visualized. Venous sinuses: As permitted by contrast timing,  patent. Anatomic variants: None significant. Review of the MIP images confirms the above findings IMPRESSION: 1. No acute intracranial process. 2. Interval stenting across the left carotid bifurcation, with widely patent stent. 3. Redemonstrated stent across the right carotid bifurcation, which is widely patent. 4. Interval placement of the left supraclinoid ICA stent, which appears patent. 5. Progression of previously noted stenosis at the origin of the vertebral arteries, now moderate to severe on the left and moderate on the right. 6. Minimal dilatation of the right ICA approximating the origin of the right posterior communicating artery, without definite aneurysm. Attention on follow-up. 7. Redemonstrated mild-to-moderate stenosis of the right M1, severe stenosis of the proximal right P1, and moderate stenosis of the left P2 segments. 8. Aortic Atherosclerosis (ICD10-I70.0) and Emphysema (ICD10-J43.9). Electronically Signed   By: Merilyn Baba M.D.   On: 12/16/2020 15:19   CT ANGIO NECK W OR WO CONTRAST  Result Date: 12/16/2020 CLINICAL DATA:  History of aneurysm repair in 2022, carotid artery stent placed EXAM: CT ANGIOGRAPHY HEAD AND NECK TECHNIQUE: Multidetector CT imaging of the head and neck  was performed using the standard protocol during bolus administration of intravenous contrast. Multiplanar CT image reconstructions and MIPs were obtained to evaluate the vascular anatomy. Carotid stenosis measurements (when applicable) are obtained utilizing NASCET criteria, using the distal internal carotid diameter as the denominator. CONTRAST:  79mL OMNIPAQUE IOHEXOL 350 MG/ML SOLN COMPARISON:  06/13/2019 FINDINGS: CT HEAD FINDINGS Brain: No evidence of acute infarction, hemorrhage, cerebral edema, mass, mass effect, or midline shift. No hydrocephalus orextra-axial fluid collection. Periventricular white matter changes, likely the sequela of chronic small vessel ischemic disease. Vascular: Please see CTA findings below. Skull: Normal. Negative for fracture or focal lesion. Sinuses/Orbits: No acute finding. Status post bilateral lens replacements. Other: The mastoid air cells are well aerated. CTA NECK FINDINGS Aortic arch: Standard branching. Imaged portion shows no evidence of aneurysm or dissection. No significant stenosis of the major arch vessel origins. Aortic atherosclerotic disease, extending into the branch vessels. Right carotid system: Status post stenting; the stent is patent with good flow. No evidence of dissection, stenosis, or occlusion. Left carotid system: Status post interval stenting; the stent is patent with good flow. No evidence of dissection, stenosis, or occlusion. Vertebral arteries: Interval increase in calcified and noncalcified plaque at the origin of the left vertebral artery, now moderate to severe (series 12, images 86-93). The left vertebral artery is otherwise patent. Mild-to-moderate narrowing at the origin of the right vertebral artery (series 12, image 90), which has progressed from the prior exam. The right vertebral artery is otherwise patent. Codominant system. Skeleton: No acute osseous abnormality. Redemonstrated degenerative changes in the cervical spine. Other neck:  Negative Upper chest: No focal pulmonary opacity or pleural effusion. Centrilobular emphysema. Review of the MIP images confirms the above findings CTA HEAD FINDINGS Anterior circulation: Interval placement of a left supraclinoid ICA stent, which appears patent. Redemonstrated atherosclerotic calcification and mild stenosis of the cavernous right internal carotid artery. Both internal carotid arteries are otherwise patent to the termini. Minimal dilatation approximating the expected origin of the right posterior communicating artery, without definite aneurysm (series 12, image 265). A1 segments patent. Normal anterior communicating artery. Anterior cerebral arteries are patent to their distal aspects. Unchanged mild to moderate stenosis of the right M1 segment. No left M1 stenosis or occlusion. Normal MCA bifurcations. Distal MCA branches perfused and symmetric. Posterior circulation: Vertebral arteries widely patent to the vertebrobasilar junction without stenosis. Posterior inferior cerebral arteries patent  bilaterally. Basilar patent to its distal aspect. Superior cerebral arteries patent bilaterally. Redemonstrated severe focal stenosis in the proximal right posterior cerebral artery. Moderate stenosis in the mid and distal left posterior cerebral artery. The bilateral posterior communicating arteries are not definitively visualized. Venous sinuses: As permitted by contrast timing, patent. Anatomic variants: None significant. Review of the MIP images confirms the above findings IMPRESSION: 1. No acute intracranial process. 2. Interval stenting across the left carotid bifurcation, with widely patent stent. 3. Redemonstrated stent across the right carotid bifurcation, which is widely patent. 4. Interval placement of the left supraclinoid ICA stent, which appears patent. 5. Progression of previously noted stenosis at the origin of the vertebral arteries, now moderate to severe on the left and moderate on the right.  6. Minimal dilatation of the right ICA approximating the origin of the right posterior communicating artery, without definite aneurysm. Attention on follow-up. 7. Redemonstrated mild-to-moderate stenosis of the right M1, severe stenosis of the proximal right P1, and moderate stenosis of the left P2 segments. 8. Aortic Atherosclerosis (ICD10-I70.0) and Emphysema (ICD10-J43.9). Electronically Signed   By: Merilyn Baba M.D.   On: 12/16/2020 15:19   DG Foot 2 Views Right  Result Date: 11/27/2020 Please see detailed radiograph report in office note.   Physical Exam: BP 128/62   Pulse 64   Ht 5\' 9"  (1.753 m)   Wt 170 lb (77.1 kg)   BMI 25.10 kg/m  Constitutional: Pleasant,well-developed, Caucasian male in no acute distress. HEENT: Normocephalic and atraumatic. Conjunctivae are normal. No scleral icterus. Cardiovascular: Normal rate, regular rhythm.  Pulmonary/chest: Effort normal and breath sounds normal. No wheezing, rales or rhonchi. Abdominal: Soft, nondistended, nontender. Bowel sounds active throughout. There are no masses palpable. No hepatomegaly. Extremities: no edema Neurological: Alert and oriented to person place and time. Skin: Skin is warm and dry. No rashes noted. Psychiatric: Normal mood and affect. Behavior is normal.  CBC    Component Value Date/Time   WBC 4.9 11/23/2020 0906   RBC 4.38 11/23/2020 0906   HGB 13.1 11/23/2020 0906   HGB 13.8 01/13/2020 1138   HCT 38.9 (L) 11/23/2020 0906   HCT 41.5 01/13/2020 1138   PLT 174.0 11/23/2020 0906   PLT 170 01/13/2020 1138   MCV 88.9 11/23/2020 0906   MCV 91 01/13/2020 1138   MCH 31.0 06/30/2020 0541   MCHC 33.5 11/23/2020 0906   RDW 13.5 11/23/2020 0906   RDW 12.6 01/13/2020 1138   LYMPHSABS 0.6 (L) 06/30/2020 0541   MONOABS 0.6 06/30/2020 0541   EOSABS 0.0 06/30/2020 0541   BASOSABS 0.0 06/30/2020 0541    CMP     Component Value Date/Time   NA 139 11/23/2020 0906   NA 143 01/13/2020 1138   K 4.0 11/23/2020  0906   CL 104 11/23/2020 0906   CO2 28 11/23/2020 0906   GLUCOSE 60 (L) 11/23/2020 0906   BUN 22 11/23/2020 0906   BUN 18 01/13/2020 1138   CREATININE 1.68 (H) 11/23/2020 0906   CALCIUM 9.6 11/23/2020 0906   PROT 6.9 11/23/2020 0906   ALBUMIN 4.4 11/23/2020 0906   AST 20 11/23/2020 0906   ALT 17 11/23/2020 0906   ALKPHOS 74 11/23/2020 0906   BILITOT 0.5 11/23/2020 0906   GFRNONAA 48 (L) 06/30/2020 0541   GFRAA 52 (L) 01/13/2020 1138   EGD 2016: LA Grade A esophagitis, no evidence of Barrett's esophagus Colonoscopy 2020: Multiple benign rectal polyps, recommended repeat in 5 years   ASSESSMENT AND PLAN: 75 year old male  with history of carotid artery stenosis and cerebral aneurysm as well as coronary artery disease not amenable to stenting, with bothersome abdominal bloating, excessive foul-smelling flatulence and poorly formed stools.  No significant abdominal pain, nausea or vomiting.  No weight loss.  No recent changes to his diet or recent antibiotics.  No medication changes.  We discussed the proposed pathophysiology of intestinal gas, and its relationship between the gut flora and diet.  Probiotics have not been helpful.  Recommended we test for SIBO with hydrogen breath test.  We will also test for H. pylori.  Patient will need to be off his PPI for 2 weeks prior to submitting H. pylori stool test.  His primary provider had also recommended he start a low FODMAP diet.  He has done this, but admits he has not been very strict with it.  I recommended that he try the diet very strictly for a month before deciding whether it is successful or not.  If his symptoms are much improved after a month on a low FODMAP diet, I recommended he start reintroducing some of the excluded foods to see how his symptoms do.  I suggested he try taking some Metamucil daily to improve his stool bulk, and he can continue to take loperamide on a as needed basis as he has been doing.  Bloating/gas - Hydrogen  breath test for SIBO - Low FODMAP diet - H. Pylori  stool antigen (stop Nexium x 2 weeks prior)  Loose stools - Metamucil - low FODMAP diet  Raiven Belizaire E. Candis Schatz, MD [facility]  CC:  Lesleigh Noe, MD

## 2020-12-18 NOTE — Patient Instructions (Signed)
If you are age 76 or older, your body mass index should be between 23-30. Your Body mass index is 25.1 kg/m. If this is out of the aforementioned range listed, please consider follow up with your Primary Care Provider.  If you are age 75 or younger, your body mass index should be between 19-25. Your Body mass index is 25.1 kg/m. If this is out of the aformentioned range listed, please consider follow up with your Primary Care Provider.   Your provider has requested that you go to the basement level for lab work before leaving today. Press "B" on the elevator. The lab is located at the first door on the left as you exit the elevator.   You have been given a testing kit to check for small intestine bacterial overgrowth (SIBO) which is completed by a company named [facility]. Make sure to return your test in the mail using the return mailing label given to you along with the kit. Your demographic and insurance information have already been sent to the company and they should be in contact with you over the next week regarding this test. [facility] will collect an upfront charge of $99.74 for commercial insurance plans and $209.74 is you are paying cash. Make sure to discuss with [facility] PRIOR to having the test if they have gotten informatoin from your insurance company as to how much your testing will cost out of pocket, if any. Please keep in mind that you will be getting a call from phone number [phone] or a similar number. If you do not hear from them within this time frame, please call our office at [phone].   The [facility] providers would like to encourage you to use [facility] to communicate with providers for non-urgent requests or questions.  Due to long hold times on the telephone, sending your provider a message by [facility] may be a faster and more efficient way to get a response.  Please allow 48 business hours for a response.  Please remember that this is for non-urgent  requests.   It was a pleasure to see you today!  Thank you for trusting me with your gastrointestinal care!    Scott E. Candis Schatz, MD

## 2020-12-21 ENCOUNTER — Telehealth (HOSPITAL_COMMUNITY): Payer: Self-pay

## 2020-12-21 NOTE — Telephone Encounter (Signed)
Pt agreed to f/u in 6 months with cta head/neck. AW 

## 2021-01-04 ENCOUNTER — Encounter (INDEPENDENT_AMBULATORY_CARE_PROVIDER_SITE_OTHER): Payer: Self-pay

## 2021-01-05 ENCOUNTER — Encounter (INDEPENDENT_AMBULATORY_CARE_PROVIDER_SITE_OTHER): Payer: Self-pay

## 2021-01-05 DIAGNOSIS — C44311 Basal cell carcinoma of skin of nose: Secondary | ICD-10-CM | POA: Diagnosis not present

## 2021-01-13 ENCOUNTER — Other Ambulatory Visit: Payer: Self-pay

## 2021-01-13 ENCOUNTER — Telehealth (INDEPENDENT_AMBULATORY_CARE_PROVIDER_SITE_OTHER): Payer: PPO | Admitting: Nurse Practitioner

## 2021-01-13 VITALS — BP 129/67 | HR 88 | Temp 98.6°F

## 2021-01-13 DIAGNOSIS — R5081 Fever presenting with conditions classified elsewhere: Secondary | ICD-10-CM

## 2021-01-13 DIAGNOSIS — R509 Fever, unspecified: Secondary | ICD-10-CM | POA: Insufficient documentation

## 2021-01-13 DIAGNOSIS — Z20822 Contact with and (suspected) exposure to covid-19: Secondary | ICD-10-CM | POA: Insufficient documentation

## 2021-01-13 DIAGNOSIS — R051 Acute cough: Secondary | ICD-10-CM | POA: Diagnosis not present

## 2021-01-13 NOTE — Assessment & Plan Note (Signed)
Continue over-the-counter medications as needed for symptom management.

## 2021-01-13 NOTE — Assessment & Plan Note (Signed)
Was exposed to COVID-19 symptoms started early this morning.  He has tested several times that were negative.  Told him likely testing little early, patient needs to test tomorrow and Friday if he does become positive we will send in medication for treatment.  Patient acknowledged

## 2021-01-13 NOTE — Assessment & Plan Note (Signed)
Continue using over-the-counter Tylenol as needed for fever.  Rest and drink plenty of fluids

## 2021-01-13 NOTE — Progress Notes (Signed)
Patient ID: STEELE STRACENER, male    DOB: 28-Mar-1945, 75 y.o.   MRN: [id number]  Virtual visit completed through [employer], a video enabled telemedicine application. Due to national recommendations of social distancing due to COVID-19, a virtual visit is felt to be most appropriate for this patient at this time. Reviewed limitations, risks, security and privacy concerns of performing a virtual visit and the availability of in person appointments. I also reviewed that there may be a patient responsible charge related to this service. The patient agreed to proceed.   Patient location: home Provider location: [place] at [facility], office Persons participating in this virtual visit: patient, provider   If any vitals were documented, they were collected by patient at home unless specified below.    BP 129/67   Pulse 88 Comment: vitals per patient  Temp 98.6 F (37 C)    CC: Exposure to Covid 19 Subjective:   HPI: John Salazar is a 75 y.o. male presenting on 01/13/2021 for Exposed to Covid (Wife tested positive on 01/09/21. Patient felt fine until 2 am this morning 01/13/21-Developed chest congestion/tightness, sore throat, slight runny nose, headache, 99.5 temp started all last night, cough. Covid test negative-x 2- last one today 01/13/21. )  Wife tested positive on 01/09/2021. Test results on Monday. She was given an antibiotic for a sinus infection. She was notified via health department and was outside the window for antiviral treatment. Symptoms started early this morning at 230am with chest tightness At home covid test was negative Pfizer x2 and boosted Has taken tylenol   Relevant past medical, surgical, family and social history reviewed and updated as indicated. Interim medical history since our last visit reviewed. Allergies and medications reviewed and updated. Outpatient Medications Prior to Visit  Medication Sig Dispense Refill   acetaminophen (TYLENOL) 325 MG tablet Take  650 mg by mouth every 6 (six) hours as needed (pain).     amLODipine (NORVASC) 10 MG tablet Take 10 mg by mouth daily.     aspirin EC 81 MG tablet Take 81 mg by mouth in the morning.     clopidogrel (PLAVIX) 75 MG tablet Take 1 tablet (75 mg total) by mouth daily. 30 tablet 0   escitalopram (LEXAPRO) 10 MG tablet TAKE 1 TABLET ONCE DAILY. 90 tablet 1   esomeprazole (NEXIUM) 20 MG capsule Take 1 capsule (20 mg total) by mouth daily at 12 noon. 90 capsule 0   isosorbide mononitrate (IMDUR) 60 MG 24 hr tablet TAKE 1 TABLET BY MOUTH DAILY. (Patient taking differently: 30 mg.) 90 tablet 2   losartan (COZAAR) 100 MG tablet Take 0.5 tablets (50 mg total) by mouth daily.     nitroGLYCERIN (NITROSTAT) 0.4 MG SL tablet Place 1 tablet (0.4 mg total) under the tongue every 5 (five) minutes as needed for chest pain. 25 tablet prn   rosuvastatin (CRESTOR) 40 MG tablet Take 1 tablet (40 mg total) by mouth daily. (Patient taking differently: Take 40 mg by mouth at bedtime.) 90 tablet 3   tamsulosin (FLOMAX) 0.4 MG CAPS capsule TAKE 1 CAPSULE 30 MINUTES AFTER THE SAME MEAL EACH DAY ONCE DAY. (Patient taking differently: Take 0.4 mg by mouth at bedtime.) 90 capsule 3   No facility-administered medications prior to visit.     Per HPI unless specifically indicated in ROS section below Review of Systems  Constitutional:  Positive for chills and fever. Negative for fatigue.  HENT:  Positive for sore throat (a little irration with  cough). Negative for congestion, ear discharge, ear pain, rhinorrhea, sinus pressure and sinus pain.   Respiratory:  Positive for cough and chest tightness. Negative for shortness of breath.   Cardiovascular:  Negative for chest pain.  Gastrointestinal:  Negative for abdominal pain, diarrhea, nausea and vomiting.  Musculoskeletal:  Negative for arthralgias and myalgias.  Neurological:  Positive for headaches (resolved).  Objective:  BP 129/67   Pulse 88 Comment: vitals per patient   Temp 98.6 F (37 C)   Wt Readings from Last 3 Encounters:  12/18/20 170 lb (77.1 kg)  11/23/20 171 lb 8 oz (77.8 kg)  06/29/20 170 lb (77.1 kg)       Physical exam: Gen: alert, NAD, not ill appearing Pulm: speaks in complete sentences without increased work of breathing Psych: normal mood, normal thought content      Results for orders placed or performed in visit on 11/23/20  TSH  Result Value Ref Range   TSH 2.43 0.35 - 5.50 uIU/mL  CBC  Result Value Ref Range   WBC 4.9 4.0 - 10.5 K/uL   RBC 4.38 4.22 - 5.81 Mil/uL   Platelets 174.0 150.0 - 400.0 K/uL   Hemoglobin 13.1 13.0 - 17.0 g/dL   HCT 38.9 (L) 39.0 - 52.0 %   MCV 88.9 78.0 - 100.0 fl   MCHC 33.5 30.0 - 36.0 g/dL   RDW 13.5 11.5 - 15.5 %  Comprehensive metabolic panel  Result Value Ref Range   Sodium 139 135 - 145 mEq/L   Potassium 4.0 3.5 - 5.1 mEq/L   Chloride 104 96 - 112 mEq/L   CO2 28 19 - 32 mEq/L   Glucose, Bld 60 (L) 70 - 99 mg/dL   BUN 22 6 - 23 mg/dL   Creatinine, Ser 1.68 (H) 0.40 - 1.50 mg/dL   Total Bilirubin 0.5 0.2 - 1.2 mg/dL   Alkaline Phosphatase 74 39 - 117 U/L   AST 20 0 - 37 U/L   ALT 17 0 - 53 U/L   Total Protein 6.9 6.0 - 8.3 g/dL   Albumin 4.4 3.5 - 5.2 g/dL   GFR 39.32 (L) >60.00 mL/min   Calcium 9.6 8.4 - 10.5 mg/dL  Celiac Pnl 2 rflx Endomysial Ab Ttr  Result Value Ref Range   Gliadin IgG <1.0 U/mL   Gliadin IgA 1.4 U/mL   (tTG) Ab, IgG <1.0 U/mL   (tTG) Ab, IgA <1.0 U/mL   Endomysial Ab IgA NEGATIVE NEGATIVE   Endomysial Titer CANCELED    Immunoglobulin A 259 70 - 320 mg/dL  Uric Acid  Result Value Ref Range   Uric Acid, Serum 4.9 4.0 - 7.8 mg/dL   Assessment & Plan:   Problem List Items Addressed This Visit       Other   Acute cough - Primary    Continue over-the-counter medications as needed for symptom management.      Fever    Continue using over-the-counter Tylenol as needed for fever.  Rest and drink plenty of fluids      Exposure to COVID-19 virus     Was exposed to COVID-19 symptoms started early this morning.  He has tested several times that were negative.  Told him likely testing little early, patient needs to test tomorrow and Friday if he does become positive we will send in medication for treatment.  Patient acknowledged        No orders of the defined types were placed in this encounter.  No orders of  the defined types were placed in this encounter.   I discussed the assessment and treatment plan with the patient. The patient was provided an opportunity to ask questions and all were answered. The patient agreed with the plan and demonstrated an understanding of the instructions. The patient was advised to call back or seek an in-person evaluation if the symptoms worsen or if the condition fails to improve as anticipated.  Follow up plan: No follow-ups on file.  Romilda Garret, NP

## 2021-01-14 ENCOUNTER — Telehealth: Payer: Self-pay | Admitting: Family Medicine

## 2021-01-14 ENCOUNTER — Other Ambulatory Visit: Payer: Self-pay | Admitting: Nurse Practitioner

## 2021-01-14 ENCOUNTER — Encounter: Payer: Self-pay | Admitting: Nurse Practitioner

## 2021-01-14 DIAGNOSIS — R051 Acute cough: Secondary | ICD-10-CM

## 2021-01-14 DIAGNOSIS — U071 COVID-19: Secondary | ICD-10-CM

## 2021-01-14 MED ORDER — MOLNUPIRAVIR EUA 200MG CAPSULE: 4.0000 | ORAL_CAPSULE | Freq: Two times a day (BID) | ORAL | 0 refills | Status: AC

## 2021-01-14 MED ORDER — BENZONATATE 100 MG PO CAPS: 200.0000 mg | ORAL_CAPSULE | Freq: Two times a day (BID) | ORAL | 0 refills | Status: AC | PRN

## 2021-01-14 NOTE — Telephone Encounter (Signed)
Called pharmacy and they were able to fax the script over to a [facility] for patient to pick it up

## 2021-01-14 NOTE — Telephone Encounter (Signed)
Ronalee Belts with [facility] called stating that they cant get medication molnupiravir EUA (LAGEVRIO) 200 mg CAPS capsule. Ronalee Belts stated that he did refill medication benzonatate (TESSALON PERLES) 100 MG capsule. Ronalee Belts stated that pt would probably want to pick medication up at the same place.

## 2021-01-20 ENCOUNTER — Ambulatory Visit: Payer: PPO | Admitting: Podiatry

## 2021-02-17 ENCOUNTER — Ambulatory Visit: Payer: PPO | Admitting: Podiatry

## 2021-02-17 ENCOUNTER — Other Ambulatory Visit: Payer: Self-pay

## 2021-02-17 ENCOUNTER — Encounter: Payer: Self-pay | Admitting: Podiatry

## 2021-02-17 DIAGNOSIS — M7662 Achilles tendinitis, left leg: Secondary | ICD-10-CM | POA: Diagnosis not present

## 2021-02-17 MED ORDER — TRIAMCINOLONE ACETONIDE 10 MG/ML IJ SUSP
10.0000 mg | Freq: Once | INTRAMUSCULAR | Status: AC
  Administered 2021-02-17: 10 mg

## 2021-02-17 NOTE — Progress Notes (Signed)
Subjective:   Patient ID: John Salazar, male   DOB: 76 y.o.   MRN: [id number]   HPI Patient presents stating he has developed some inflammation in the outside of the left heel again and he like to have it treated 1 more time before we have to consider a more aggressive treatment plan   ROS      Objective:  Physical Exam  Neurovascular status intact inflammation of the Achilles tendon lateral side no center no medial involvement     Assessment:  Achilles tendinitis left inflamed lateral side with possibility of systemic inflammation     Plan:  Reviewed at great length he is desperate to try conservative care not interested in surgery or shockwave and I reviewed that with him.  We will get a try 1 more injection he absolutely understands chances for rupture with this and he is willing to accept that risk and I did a very careful injection of the lateral side 3 mg Dexasone Kenalog 5 mg Xylocaine dispensed heel lift advised on complete reduced activity continued boot usage at home along with ice therapy.  Reappoint if symptoms indicate we may have to consider other treatment and I did discuss possibility for shockwave or PRP injection

## 2021-02-19 ENCOUNTER — Telehealth: Payer: PPO

## 2021-02-26 ENCOUNTER — Other Ambulatory Visit: Payer: Self-pay | Admitting: Internal Medicine

## 2021-02-26 NOTE — Telephone Encounter (Signed)
Pt's medication was sent to pt's pharmacy as requested. Confirmation received.  °

## 2021-03-06 ENCOUNTER — Other Ambulatory Visit: Payer: Self-pay | Admitting: Family Medicine

## 2021-03-06 DIAGNOSIS — K2270 Barrett's esophagus without dysplasia: Secondary | ICD-10-CM

## 2021-03-08 ENCOUNTER — Telehealth: Payer: Self-pay | Admitting: Gastroenterology

## 2021-03-08 NOTE — Telephone Encounter (Signed)
Patient called states he was receive a call from someone about having a SIBO test but has not heard anything.

## 2021-03-09 NOTE — Telephone Encounter (Signed)
Left VM letting patient know that I called [facility] and let them know he had not received a call and resent the order to [facility].

## 2021-03-17 ENCOUNTER — Telehealth: Payer: Self-pay

## 2021-03-17 NOTE — Progress Notes (Signed)
Chronic Care Management Pharmacy Assistant   Name: John Salazar  MRN: [id number] DOB: September 27, 1945  Reason for Encounter: CCM (Initial Questions)   Recent office visits:  11/23/2020 - John Schooner, MD - Patient presented for abdominal pain and diarrhea. Labs: CBC, CMP, Celiac, TSH and Uric Acid. Start: esomeprazole (NEXIUM) 20 MG capsule. Stop: pantoprazole (PROTONIX) 40 MG tablet. Immunizations: [employer] (high dose 65+). 11/17/2020 - John Schooner, MD - Patient Message - Patient presented for right heel pain. Recommended: Voltaren gel or Ibuprofen/Tylenol.  11/11/2020 - John Schooner, MD - Patient Message - Referral to Gastroenterology.   Recent consult visits:  02/17/2021 - John Salazar, DPM - Podiatry - Patient presented for achilles tendinitis of left foot. Administered: triamcinolone acetonide (KENALOG) 10 MG/ML injection 10 mg. 01/14/2021 - John Ito, NP - Patient Message - Patient tested positive for Covid. Start: molnupiravir EUA (LAGEVRIO) 200 mg CAPS capsule. Start: benzonatate (TESSALON PERLES) 100 MG capsule. 01/13/2021 - John Ito, NP - Patient presented for Covid exposure and cough. Patient was advised to test for Covid in the next couple of days.  01/05/2021 - John Salazar - Dermatology - Patient presented for basal cell carcinoma of nose.  12/18/2020 - John Flock, MD - Gastroenterology - Patient presented for gas and bloating. Ordered: H. Pylori.  12/16/2020 - John Salazar, DPM - Podiatry - Patient presented for achilles tendinitis of right foot. Administered: triamcinolone acetonide (KENALOG) 10 MG/ML injection 10 mg. 11/20/2020 - John Salazar, DPM - Podiatry - Patient presented for foot pain. Orders: DG Foot 2 view right. Administered: triamcinolone acetonide (KENALOG) 10 MG/ML injection 10 mg. Diagnoses: Plantar Fascitis.  10/27/2020 - John Salazar, Specialist - Patient presented for follow up exam after completed treatment of malignant neoplasm. Procedure: Skin  biopsy single lesion.  10/27/2020 - John Salazar - Pathology - Patient presented for basal cell carcinoma of skin of nose. Procedure: [facility] Surgical; path. Level IV.  10/15/2020 - John Salazar - Patient presented for sensorineural hearing loss, bilateral. Procedure: assessment for hearing aid.  10/13/2020 - John Salazar, RPH-CPP - Cardiology - Telephone - Change as patient adjusted on own losartan (COZAAR) 100 MG tablet - Patient taking 50 mg. Change as patient adjusted on own Amlodipine 10 mg - patient taking 5 mg.   Hospital visits:  None in previous 6 months  Medications: Outpatient Encounter Medications as of 03/17/2021  Medication Sig   acetaminophen (TYLENOL) 325 MG tablet Take 650 mg by mouth every 6 (six) hours as needed (pain).   amLODipine (NORVASC) 10 MG tablet Take 10 mg by mouth daily.   aspirin EC 81 MG tablet Take 81 mg by mouth in the morning.   clopidogrel (PLAVIX) 75 MG tablet TAKE ONE TABLET BY MOUTH DAILY **NEED OFFICE VISIT**   escitalopram (LEXAPRO) 10 MG tablet TAKE 1 TABLET ONCE DAILY.   esomeprazole (NEXIUM) 20 MG capsule Take 1 capsule (20 mg total) by mouth daily at 12 noon.   isosorbide mononitrate (IMDUR) 60 MG 24 hr tablet TAKE 1 TABLET BY MOUTH DAILY. (Patient taking differently: 30 mg.)   losartan (COZAAR) 100 MG tablet Take 0.5 tablets (50 mg total) by mouth daily.   nitroGLYCERIN (NITROSTAT) 0.4 MG SL tablet Place 1 tablet (0.4 mg total) under the tongue every 5 (five) minutes as needed for chest pain.   rosuvastatin (CRESTOR) 40 MG tablet Take 1 tablet (40 mg total) by mouth daily. (Patient taking differently: Take 40 mg by mouth at bedtime.)   tamsulosin (FLOMAX) 0.4 MG CAPS capsule  TAKE 1 CAPSULE 30 MINUTES AFTER THE SAME MEAL EACH DAY ONCE DAY. (Patient taking differently: Take 0.4 mg by mouth at bedtime.)   No facility-administered encounter medications on file as of 03/17/2021.   Lab Results  Component Value Date/Time   HGBA1C 5.8 04/09/2018 09:59 AM    HGBA1C 5.7 (H) 04/19/2017 08:29 AM    BP Readings from Last 3 Encounters:  01/13/21 129/67  12/18/20 128/62  11/23/20 118/78   Patient contacted to review initial questions prior to visit with John Salazar.  Have you seen any other providers since your last visit with PCP? Yes  Any changes in your medications or health? No  Any side effects from any medications? No  Do you have an symptoms or problems not managed by your medications? No  Any concerns about your health right now? Yes  Has your provider asked that you check blood pressure, blood sugar, or follow special diet at home? Yes   Do you get any type of exercise on a regular basis? No  Can you think of a goal you would like to reach for your health? No  Do you have any problems getting your medications? No  Is there anything that you would like to discuss during the appointment? No  Spoke with patient and reminded them to have all medications, supplements and any blood glucose and blood pressure readings available for review with pharmacist, at their telephone visit on 03/22/2021 at 9:00 am.    Star Rating Drugs:  Medication:  Last Fill: Day Supply Losartan 100 mg 01/11/2021 90  Rosuvastatin 40 mg 01/11/2021 90   Care Gaps: Annual wellness visit in last year? Yes 04/29/2020 Most Recent BP reading: 129/67 on 01/13/2021  John Salazar, CPP notified  John Salazar, [place] Clinical Pharmacy Assistant [phone]  Time Spent: 40 Minutes

## 2021-03-19 ENCOUNTER — Telehealth: Payer: Self-pay

## 2021-03-19 NOTE — Progress Notes (Signed)
° ° °  Chronic Care Management Pharmacy Assistant   Name: ARVLE GRABE  MRN: [id number] DOB: 1946-01-20  Reason for Encounter: CCM (Appointment Reminder)   Medications: Outpatient Encounter Medications as of 03/19/2021  Medication Sig   acetaminophen (TYLENOL) 325 MG tablet Take 650 mg by mouth every 6 (six) hours as needed (pain).   amLODipine (NORVASC) 10 MG tablet Take 10 mg by mouth daily.   aspirin EC 81 MG tablet Take 81 mg by mouth in the morning.   clopidogrel (PLAVIX) 75 MG tablet TAKE ONE TABLET BY MOUTH DAILY **NEED OFFICE VISIT**   escitalopram (LEXAPRO) 10 MG tablet TAKE 1 TABLET ONCE DAILY.   esomeprazole (NEXIUM) 20 MG capsule Take 1 capsule (20 mg total) by mouth daily at 12 noon.   isosorbide mononitrate (IMDUR) 60 MG 24 hr tablet TAKE 1 TABLET BY MOUTH DAILY. (Patient taking differently: 30 mg.)   losartan (COZAAR) 100 MG tablet Take 0.5 tablets (50 mg total) by mouth daily.   nitroGLYCERIN (NITROSTAT) 0.4 MG SL tablet Place 1 tablet (0.4 mg total) under the tongue every 5 (five) minutes as needed for chest pain.   rosuvastatin (CRESTOR) 40 MG tablet Take 1 tablet (40 mg total) by mouth daily. (Patient taking differently: Take 40 mg by mouth at bedtime.)   tamsulosin (FLOMAX) 0.4 MG CAPS capsule TAKE 1 CAPSULE 30 MINUTES AFTER THE SAME MEAL EACH DAY ONCE DAY. (Patient taking differently: Take 0.4 mg by mouth at bedtime.)   No facility-administered encounter medications on file as of 03/19/2021.   ALEKS NAWROT was contacted to remind of upcoming telephone visit with [name] on 03/22/2021 at 9:00 am. Patient was reminded to have all medications, supplements and any blood glucose and blood pressure readings available for review at appointment. If unable to reach, a voicemail was left for patient.   Are you having any problems with your medications? No   Do you have any concerns you like to discuss with the pharmacist? No  Star Rating Drugs: Medication:  Last  Fill: Day Supply Losartan 100 mg         01/11/2021      90         Rosuvastatin 40 mg    01/11/2021      Lely, CPP notified  [name], [place]  Time Spent: 10 Minutes

## 2021-03-22 ENCOUNTER — Other Ambulatory Visit: Payer: Self-pay

## 2021-03-22 ENCOUNTER — Ambulatory Visit (INDEPENDENT_AMBULATORY_CARE_PROVIDER_SITE_OTHER): Payer: PPO | Admitting: Pharmacist

## 2021-03-22 DIAGNOSIS — F320 Major depressive disorder, single episode, mild: Secondary | ICD-10-CM

## 2021-03-22 DIAGNOSIS — K2270 Barrett's esophagus without dysplasia: Secondary | ICD-10-CM

## 2021-03-22 DIAGNOSIS — I10 Essential (primary) hypertension: Secondary | ICD-10-CM

## 2021-03-22 DIAGNOSIS — N400 Enlarged prostate without lower urinary tract symptoms: Secondary | ICD-10-CM

## 2021-03-22 DIAGNOSIS — E782 Mixed hyperlipidemia: Secondary | ICD-10-CM

## 2021-03-22 DIAGNOSIS — I671 Cerebral aneurysm, nonruptured: Secondary | ICD-10-CM

## 2021-03-22 DIAGNOSIS — N1832 Chronic kidney disease, stage 3b: Secondary | ICD-10-CM

## 2021-03-22 NOTE — Progress Notes (Signed)
Chronic Care Management Pharmacy Note  03/22/2021 Name:  John Salazar MRN:  [id number] DOB:  [birth year]  Summary: CCM Initial visit -Pt is taking 1/2 tab (50 mg) of losartan most days, lately he has been skipping does due to low BP with dizziness (93/61, 84/56 in the past week) -Pt recently switched from pantoprazole to esomeprazole and reports improvement in GERD; unfortunately esomeprazole interacts with clopidogrel to reduce its efficacy (limits conversion to active metabolites)  Recommendations/Changes made from today's visit: -Recommended to switch esomeprazole back to pantoprazole 40 mg daily (no DDI with clopidogrel) -Recommended to reduce amlodipine to 1/2 tab (5 mg) to allow for daily losartan  Plan: -[place] will call patient in 2 weeks for BP log -Pharmacist follow up televisit scheduled for 1 month -Due for cardiology f/u 05/2021 - pt to schedule appt in March -Due for PCP annual exam 11/2021   Subjective: John Salazar is an 76 y.o. year old male who is a primary patient of Cody, Jobe Marker, MD.  The CCM team was consulted for assistance with disease management and care coordination needs.    Engaged with patient by telephone for initial visit in response to provider referral for pharmacy case management and/or care coordination services.   Consent to Services:  The patient was given the following information about Chronic Care Management services today, agreed to services, and gave verbal consent: 1. CCM service includes personalized support from designated clinical staff supervised by the primary care provider, including individualized plan of care and coordination with other care providers 2. 24/7 contact phone numbers for assistance for urgent and routine care needs. 3. Service will only be billed when office clinical staff spend 20 minutes or more in a month to coordinate care. 4. Only one practitioner may furnish and bill the service in a calendar month.  5.The patient may stop CCM services at any time (effective at the end of the month) by phone call to the office staff. 6. The patient will be responsible for cost sharing (co-pay) of up to 20% of the service fee (after annual deductible is met). Patient agreed to services and consent obtained.  Patient Care Team: Lesleigh Noe, MD as PCP - General (Family Medicine) Fay Records, MD as PCP - Cardiology (Cardiology) Marlou Sa Tonna Corner, MD as Consulting Physician (Orthopedic Surgery) Luanne Bras, MD as Consulting Physician (Interventional Radiology) Clarene Essex, MD as Consulting Physician (Gastroenterology) Charlton Haws, [facility] as Pharmacist (Pharmacist)  Recent office visits: 01/14/2021 - [name], NP - Patient Message - Patient tested positive for Covid. Start: Physicist, medical. 01/13/2021 - [name], NP - Patient presented for Covid exposure and cough. Patient was advised to test for Covid.  11/23/2020 - [name], MD - Patient presented for abdominal pain and diarrhea. Labs: CBC, CMP, Celiac, TSH and Uric Acid. Start: esomeprazole (NEXIUM) 20 MG capsule. Stop: pantoprazole (PROTONIX) 40 MG tablet. Immunizations: [employer] (high dose 65+).  11/17/2020 - [name], MD - Patient Message - Patient presented for right heel pain. Recommended: Voltaren gel or Ibuprofen/Tylenol.   11/11/2020 - [name], MD - Patient Message - Referral to Gastroenterology.   Recent consult visits: 02/17/2021 - [name], DPM - Podiatry - Patient presented for achilles tendinitis of left foot. Administered: Kenalog injection. 01/05/2021 - [name] - Dermatology - Patient presented for basal cell carcinoma of nose.  12/18/2020 - [name], MD - Gastroenterology - Patient presented for gas and bloating. Ordered: H. Pylori.  12/16/2020 -  [name], DPM - Podiatry - Patient presented for achilles tendinitis of right foot. Administered: Kenalog  injection. 11/20/2020 - [name], DPM - Podiatry - Patient presented for foot pain. Orders: DG Foot 2 view right. Administered: Kenalog injection. 10/27/2020 - [name], Specialist - Patient presented for follow up exam after completed treatment of malignant neoplasm. Procedure: Skin biopsy single lesion.  10/27/2020 - [name] - Pathology - Patient presented for basal cell carcinoma of skin of nose. Procedure: CHG Surgical; path. Level IV.  10/15/2020 - [name] - Patient presented for sensorineural hearing loss, bilateral. Procedure: assessment for hearing aid.  10/13/2020 - [name], RPH-CPP - Cardiology - Telephone - patient self adjusted meds: losartan 1/2 tab and amlodipine 1/2 tab. BP at goal.  Hospital visits: None in previous 6 months   Objective:  Lab Results  Component Value Date   CREATININE 1.68 (H) 11/23/2020   BUN 22 11/23/2020   GFR 39.32 (L) 11/23/2020   GFRNONAA 48 (L) 06/30/2020   GFRAA 52 (L) 01/13/2020   NA 139 11/23/2020   K 4.0 11/23/2020   CALCIUM 9.6 11/23/2020   CO2 28 11/23/2020   GLUCOSE 60 (L) 11/23/2020    Lab Results  Component Value Date/Time   HGBA1C 5.8 04/09/2018 09:59 AM   HGBA1C 5.7 (H) 04/19/2017 08:29 AM   GFR 39.32 (L) 11/23/2020 09:06 AM   GFR 46.38 (L) 05/17/2019 07:29 AM    Last diabetic Eye exam: No results found for: HMDIABEYEEXA  Last diabetic Foot exam: No results found for: HMDIABFOOTEX   Lab Results  Component Value Date   CHOL 160 05/17/2019   HDL 65.80 05/17/2019   LDLCALC 77 05/17/2019   TRIG 85.0 05/17/2019   CHOLHDL 2 05/17/2019    Hepatic Function Latest Ref Rng & Units 11/23/2020 05/04/2020 05/17/2019  Total Protein 6.0 - 8.3 g/dL 6.9 6.6 7.0  Albumin 3.5 - 5.2 g/dL 4.4 3.7 4.4  AST 0 - 37 U/L 20 18 19   ALT 0 - 53 U/L 17 13 22   Alk Phosphatase 39 - 117 U/L 74 73 77  Total Bilirubin 0.2 - 1.2 mg/dL 0.5 0.6 0.7    Lab Results  Component Value Date/Time   TSH 2.43 11/23/2020 09:06 AM   TSH  1.11 02/11/2019 02:26 PM    CBC Latest Ref Rng & Units 11/23/2020 06/30/2020 06/26/2020  WBC 4.0 - 10.5 K/uL 4.9 7.4 4.9  Hemoglobin 13.0 - 17.0 g/dL 13.1 11.0(L) 13.5  Hematocrit 39.0 - 52.0 % 38.9(L) 32.2(L) 40.8  Platelets 150.0 - 400.0 K/uL 174.0 143(L) 165    Lab Results  Component Value Date/Time   VD25OH 31.70 02/11/2019 02:26 PM    Clinical ASCVD: Yes  - angina The 10-year ASCVD risk score (Arnett DK, et al., 2019) is: 26.2%   Values used to calculate the score:     Age: 1 years     Sex: Male     Is Non-Hispanic African American: No     Diabetic: No     Tobacco smoker: No     Systolic Blood Pressure: 537 mmHg     Is BP treated: Yes     HDL Cholesterol: 65.8 mg/dL     Total Cholesterol: 152 mg/dL    Depression screen [facility] 2/9 04/29/2020 04/29/2020 02/27/2019  Decreased Interest 0 0 1  Down, Depressed, Hopeless 0 0 0  PHQ - 2 Score 0 0 1  Altered sleeping - - 0  Tired, decreased energy - - 1  Change in  appetite - - 0  Feeling bad or failure about yourself  - - 0  Trouble concentrating - - 0  Moving slowly or fidgety/restless - - 0  Suicidal thoughts - - 0  PHQ-9 Score - - 2  Difficult doing work/chores - - Not difficult at all     Social History   Tobacco Use  Smoking Status Former   Packs/day: 2.00   Years: 25.00   Pack years: 50.00   Types: Cigarettes   Quit date: 03/10/1996   Years since quitting: 25.0  Smokeless Tobacco Never   BP Readings from Last 3 Encounters:  01/13/21 129/67  12/18/20 128/62  11/23/20 118/78   Pulse Readings from Last 3 Encounters:  01/13/21 88  12/18/20 64  11/23/20 84   Wt Readings from Last 3 Encounters:  12/18/20 170 lb (77.1 kg)  11/23/20 171 lb 8 oz (77.8 kg)  06/29/20 170 lb (77.1 kg)   BMI Readings from Last 3 Encounters:  12/18/20 25.10 kg/m  11/23/20 25.33 kg/m  06/29/20 25.10 kg/m    Assessment/Interventions: Review of patient past medical history, allergies, medications, health status, including review  of consultants reports, laboratory and other test data, was performed as part of comprehensive evaluation and provision of chronic care management services.   SDOH:  (Social Determinants of Health) assessments and interventions performed: Yes SDOH Interventions    Flowsheet Row Most Recent Value  SDOH Interventions   Food Insecurity Interventions Intervention Not Indicated  Financial Strain Interventions Intervention Not Indicated      SDOH Screenings   Alcohol Screen: Not on file  Depression (PHQ2-9): Low Risk    PHQ-2 Score: 0  Financial Resource Strain: Low Risk    Difficulty of Paying Living Expenses: Not very hard  Food Insecurity: No Food Insecurity   Worried About Charity fundraiser in the Last Year: Never true   Ran Out of Food in the Last Year: Never true  Housing: Not on file  Physical Activity: Not on file  Social Connections: Not on file  Stress: Not on file  Tobacco Use: Medium Risk   Smoking Tobacco Use: Former   Smokeless Tobacco Use: Never   Passive Exposure: Not on file  Transportation Needs: Not on file    [place]  Allergies  Allergen Reactions   Bee Venom Anaphylaxis and Shortness Of Breath   Penicillins Rash    PATIENT HAS HAD A PCN REACTION WITH IMMEDIATE RASH, FACIAL/TONGUE/THROAT SWELLING, SOB, OR LIGHTHEADEDNESS WITH HYPOTENSION:  #  #  YES  #  #  Has patient had a PCN reaction causing severe rash involving mucus membranes or skin necrosis: no Has patient had a PCN reaction that required hospitalization: no Has patient had a PCN reaction occurring within the last 10 years: no     Medications Reviewed Today     Reviewed by Charlton Haws, [place] (Pharmacist) on 03/22/21 at 1002  Med List Status: <None>   Medication Order Taking? Sig Documenting Provider Last Dose Status Informant  acetaminophen (TYLENOL) 325 MG tablet [id number] Yes Take 650 mg by mouth every 6 (six) hours as needed (pain). [provider] Taking Active Self   amLODipine (NORVASC) 10 MG tablet [id number] Yes Take 5 mg by mouth daily. Fay Records, MD Taking Active   aspirin EC 81 MG tablet [id number] Yes Take 81 mg by mouth in the morning. [provider] Taking Active Self  clopidogrel (PLAVIX) 75 MG tablet [id number] Yes TAKE ONE TABLET BY MOUTH  DAILY **NEED OFFICE VISITFay Records, MD Taking Active   escitalopram (LEXAPRO) 10 MG tablet [id number] Yes TAKE 1 TABLET ONCE DAILY. Lesleigh Noe, MD Taking Active   esomeprazole (NEXIUM) 20 MG capsule [id number] Yes Take 1 capsule (20 mg total) by mouth daily at 12 noon. Lesleigh Noe, MD Taking Active   isosorbide mononitrate (IMDUR) 60 MG 24 hr tablet [id number] Yes TAKE 1 TABLET BY MOUTH DAILY.  Patient taking differently: 30 mg.   Fay Records, MD Taking Active   losartan (COZAAR) 100 MG tablet [id number] Yes Take 0.5 tablets (50 mg total) by mouth daily. Fay Records, MD Taking Active   nitroGLYCERIN (NITROSTAT) 0.4 MG SL tablet [id number]  Place 1 tablet (0.4 mg total) under the tongue every 5 (five) minutes as needed for chest pain. Nelva Bush, MD  Expired 01/13/21 2359 Self           Med Note Gar [place]   Mon May 11, 2020  9:56 AM)    rosuvastatin (CRESTOR) 40 MG tablet [id number] Yes Take 1 tablet (40 mg total) by mouth daily.  Patient taking differently: Take 40 mg by mouth at bedtime.   Fay Records, MD Taking Active Self  tamsulosin (FLOMAX) 0.4 MG CAPS capsule [id number] Yes TAKE 1 CAPSULE 30 MINUTES AFTER THE SAME MEAL EACH DAY ONCE DAY.  Patient taking differently: Take 0.4 mg by mouth at bedtime.   Lesleigh Noe, MD Taking Active Self  Turmeric 500 MG TABS [id number] Yes Take by mouth. [provider] Taking Active             Patient Active Problem List   Diagnosis Date Noted   Acute cough 01/13/2021   Fever 01/13/2021   Exposure to COVID-19 virus 01/13/2021   Arthralgia of right foot 11/23/2020   Chronic diarrhea 11/23/2020   Internal  carotid artery stenosis, right 06/29/2020   Brain aneurysm 05/18/2020   Depression, major, single episode, mild ([place]) 02/27/2019   Snoring 02/27/2019   Accelerating angina ([place]) 11/15/2018   DNR (do not resuscitate) 08/23/2018   Hx of cataract surgery 08/23/2018   History of right common carotid artery stent placement 06/18/2018   Screening for prostate cancer 06/18/2018   Prediabetes 03/19/2018   CKD (chronic kidney disease) stage 3, GFR 30-59 ml/min ([place]) 02/05/2018   Barrett esophagus 01/08/2018   Benign essential HTN 01/08/2018   BPH (benign prostatic hyperplasia) 01/08/2018   HLD (hyperlipidemia) 01/08/2018   Anemia 01/08/2018   Carotid stenosis, symptomatic w/o infarct, right 08/23/2017    Immunization History  Administered Date(s) Administered   Fluad Quad(high Dose 65+) 10/04/2018, 12/07/2019, 11/23/2020   Influenza, High Dose Seasonal PF 11/07/2017   PFIZER(Purple Top)SARS-COV-2 Vaccination 03/16/2019, 04/08/2019, 08/18/2020   Pneumococcal Conjugate-13 07/26/2013   Pneumococcal Polysaccharide-23 11/19/2009   Pneumococcal-Unspecified 03/24/2017   Tdap 11/12/2010   Zoster Recombinat (Shingrix) 09/27/2018, 12/27/2018    Conditions to be addressed/monitored:  Hypertension, Hyperlipidemia, Coronary Artery Disease, GERD, Chronic Kidney Disease, Depression, and BPH  Care Plan : [place]  Updates made by Charlton Haws, [place] since 03/22/2021 12:00 AM     Problem: Hypertension, Hyperlipidemia, Coronary Artery Disease, GERD, Chronic Kidney Disease, Depression, and BPH   Priority: High     Long-Range Goal: Disease mgmt   Start Date: 03/22/2021  Expected End Date: 03/22/2022  This Visit's Progress: On track  Priority: High  Note:   Current Barriers:  Unable to independently monitor therapeutic efficacy Suboptimal therapeutic regimen for  GERD/ASCVD  Pharmacist Clinical Goal(s):  Patient will achieve adherence to monitoring guidelines and medication  adherence to achieve therapeutic efficacy adhere to plan to optimize therapeutic regimen for GERD/ASCVD as evidenced by report of adherence to recommended medication management changes through collaboration with PharmD and provider.   Interventions: 1:1 collaboration with Lesleigh Noe, MD regarding development and update of comprehensive plan of care as evidenced by provider attestation and co-signature Inter-disciplinary care team collaboration (see longitudinal plan of care) Comprehensive medication review performed; medication list updated in electronic medical record  Hypertension / CKD stage 3b (BP goal <130/80) -Not ideally controlled - pt has hx of labile BP, he self-adjusts medications; BP at home has been sometimes low (<90/60) and will skip losartan dose when this happens; discussed benefits of losartan for kidney protection as well as BP, ideally he should take this daily -Current home readings: 93/61, 114/72, 116/74, 84/56 -Current treatment: Amlodipine 10 mg daily HS - Appropriate, Effective, Query Safe (low BP) Accessible Isosorbide MN 60 mg -1/2 tab daily - Appropriate, Effective, Safe, Accessible Losartan 100 mg - 1/2 tab daily - Appropriate, Effective, Safe, Accessible -Medications previously tried: n/a  -Educated on BP goals and benefits of medications for prevention of heart attack, stroke and kidney damage; Symptoms of hypotension and importance of maintaining adequate hydration; -Counseled to monitor BP at home daily, document, and provide log at future appointments -Recommended to take 1/2 amlodipine (5 mg) to allow for daily losartan  Hyperlipidemia: (LDL goal < 70) -Not ideally controlled - LDL is slightly above goal, but last checked 01/2020; he endorses compliance with medications as below -Hx angina; hx brain aneurysm w/ stent 06/2020; managed per Dr Harrington Challenger -Current treatment: Rosuvastatin 40 mg daily HS - Appropriate, Query Effective, Safe, Accessible Isosorbide  MN 60 mg - 1/2 tab daily - Appropriate, Effective, Safe, Accessible Clopidogrel 75 mg daily - Appropriate, Query Effective (drug interaction with esomeprazole) Nitroglycerin 0.4 mg SL prn - Appropriate, Effective, Safe, Accessible Aspirin 81 mg daily -Appropriate, Effective, Safe, Accessible -Current dietary patterns: tries to eat healthy; enjoys fried chicken, desserts occasionally -Current exercise habits: limited due to knee/foot pain; was doing water aerobics before pandemic -Educated on Cholesterol goals; Benefits of statin for ASCVD risk reduction; Importance of limiting foods high in cholesterol; Exercise goal of 150 minutes per week; -Discussed drug interaction between esomeprazole and clopidogrel that can limit efficacy of clopidogrel (due to CYP219 competition); given stent placement in the past year it is advisable to switch to a different PPI (see GERD below) -Pt is overdue for annual lipid panel; he is due to follow up with cardiology April 2023, pt plans to call in March to make appt -Discussed possibility of adding ezetimibe if LDL remains > 70 -Recommended to continue current medication; repeat lipid panel at cardiology appt 05/2021  Depression/Anxiety (Goal: manage symptoms) -Controlled - pt thinks he does "pretty well" most of the time; he reports he has never been able to tell how much Lexapro is helping; he reports low motivation to do things, whereas most of his life he has been a "self-motivated individual" -Pt is recovering alcoholic, sober for 29 years; he reports social life is fairly active  -PHQ9: 0 (04/2020) - minimal depression -GAD7: 4 (01/2021) - minimal anxiety -Connected with PCP for mental health support -Current treatment: Escitalopram 10 mg daily -Appropriate, Effective, Safe, Accessible -Medications previously tried/failed: hydroxyzine -Educated on Benefits of medication for symptom control; discussed abruptly stopping medication can lead to worsening  depression -Recommended to continue  current medication  GERD (Goal: manage symptoms) -Controlled - pt repots pantoprazole was switched to esomeprazole ~4 months ago due to pantoprazole not as effective, he reports esomeprazole has been more effective; he takes this at noon separated from clopidogrel at night -Current treatment  Esomeprazole 20 mg daily 12p -Appropriate, Effective, Query Safe (DDI with clopidogrel) -Discussed drug interaction between clopidogrel and esomeprazole - competition for CYP219 limits conversion of clopidogrel to active metabolites, thereby limiting its efficacy; unfortunately just separating the timing of these meds does NOT mitigate the interaction; it is advisable to switch to pantoprazole which does not have this interaction -Recommend to switch esomeprazole to pantoprazole 40 mg daily  BPH (Goal: manage symptoms) -Controlled -wakes up 1-2 times per night to urinate -Current treatment  Tamsulosin 0.4 mg daily -Appropriate, Effective, Safe, Accessible -Recommended to continue current medication  Osteoarthritis / Tendinitis (Goal: manage pain) -Controlled - pt reports using turmeric often, tylenol rarely, and ibuprofen very rarely for persistent pain only -Current treatment  Turmeric -Appropriate, Effective, Safe, Accessible Tylenol 325 mg PRN -Appropriate, Effective, Safe, Accessible Ibuprofen 200-400 mg PRN -Appropriate, Effective, Query Safe -Discussed risks of NSAID use including kidney damage, high BP, and bleeding; advised to limit NSAID as much as possible, use Tylenol for pain  Health Maintenance -Vaccine gaps: covid booster, TDAP  Patient Goals/Self-Care Activities Patient will:  - take medications as prescribed as evidenced by patient report and record review focus on medication adherence by routine check blood pressure daily, document, and provide at future appointments      Medication Assistance: None required.  Patient affirms current  coverage meets needs.  Compliance/Adherence/Medication fill history: Care Gaps: NONE  Star-Rating Drugs: Rosuvastatin - LF 01/11/21 X 90 DS; [place] 98%  Patient's preferred pharmacy is:  [facility], [place] [address] Phone: [phone] Fax: [phone]  Uses pill box? No -   uses bathroom drawers for AM/PM meds Pt endorses 100% compliance  We discussed: Current pharmacy is preferred with insurance plan and patient is satisfied with pharmacy services Patient decided to: Continue current medication management strategy  Care Plan and Follow Up Patient Decision:  Patient agrees to Care Plan and Follow-up.  Plan: Telephone follow up appointment with care management team member scheduled for:  1 month  [name], PharmD, [facility] Clinical Pharmacist [facility] [phone]

## 2021-03-22 NOTE — Patient Instructions (Signed)
Visit Information  Phone number for Pharmacist: [phone]  Thank you for meeting with me to discuss your medications! I look forward to working with you to achieve your health care goals. Below is a summary of what we talked about during the visit:   Goals Addressed             This Visit's Progress    Track and Manage My Blood Pressure-Hypertension       Timeframe:  Long-Range Goal Priority:  Medium Start Date:     03/22/21                        Expected End Date:   03/22/22                    Follow Up Date March 2023   - check blood pressure daily - choose a place to take my blood pressure (home, clinic or office, retail store) - write blood pressure results in a log or diary    Why is this important?   You won't feel high blood pressure, but it can still hurt your blood vessels.  High blood pressure can cause heart or kidney problems. It can also cause a stroke.  Making lifestyle changes like losing a little weight or eating less salt will help.  Checking your blood pressure at home and at different times of the day can help to control blood pressure.  If the doctor prescribes medicine remember to take it the way the doctor ordered.  Call the office if you cannot afford the medicine or if there are questions about it.     Notes:         Care Plan : [place]  Updates made by John Salazar, RPH since 03/22/2021 12:00 AM     Problem: Hypertension, Hyperlipidemia, Coronary Artery Disease, GERD, Chronic Kidney Disease, Depression, and BPH   Priority: High     Long-Range Goal: Disease mgmt   Start Date: 03/22/2021  Expected End Date: 03/22/2022  This Visit's Progress: On track  Priority: High  Note:   Current Barriers:  Unable to independently monitor therapeutic efficacy Suboptimal therapeutic regimen for GERD/ASCVD  Pharmacist Clinical Goal(s):  Patient will achieve adherence to monitoring guidelines and medication adherence to achieve  therapeutic efficacy adhere to plan to optimize therapeutic regimen for GERD/ASCVD as evidenced by report of adherence to recommended medication management changes through collaboration with PharmD and provider.   Interventions: 1:1 collaboration with Lesleigh Noe, MD regarding development and update of comprehensive plan of care as evidenced by provider attestation and co-signature Inter-disciplinary care team collaboration (see longitudinal plan of care) Comprehensive medication review performed; medication list updated in electronic medical record  Hypertension / CKD stage 3b (BP goal <130/80) -Not ideally controlled - pt has hx of labile BP, he self-adjusts medications; BP at home has been sometimes low (<90/60) and will skip losartan dose when this happens; discussed benefits of losartan for kidney protection as well as BP, ideally he should take this daily -Current home readings: 93/61, 114/72, 116/74, 84/56 -Current treatment: Amlodipine 10 mg daily HS - Appropriate, Effective, Query Safe (low BP) Accessible Isosorbide MN 60 mg -1/2 tab daily - Appropriate, Effective, Safe, Accessible Losartan 100 mg - 1/2 tab daily - Appropriate, Effective, Safe, Accessible -Medications previously tried: n/a  -Educated on BP goals and benefits of medications for prevention of heart attack, stroke and kidney damage; Symptoms of hypotension and  importance of maintaining adequate hydration; -Counseled to monitor BP at home daily, document, and provide log at future appointments -Recommended to take 1/2 amlodipine (5 mg) to allow for daily losartan  Hyperlipidemia: (LDL goal < 70) -Not ideally controlled - LDL is slightly above goal, but last checked 01/2020; he endorses compliance with medications as below -Hx angina; hx brain aneurysm w/ stent 06/2020; managed per Dr Harrington Challenger -Current treatment: Rosuvastatin 40 mg daily HS - Appropriate, Query Effective, Safe, Accessible Isosorbide MN 60 mg - 1/2 tab  daily - Appropriate, Effective, Safe, Accessible Clopidogrel 75 mg daily - Appropriate, Query Effective (drug interaction with esomeprazole) Nitroglycerin 0.4 mg SL prn - Appropriate, Effective, Safe, Accessible Aspirin 81 mg daily -Appropriate, Effective, Safe, Accessible -Current dietary patterns: tries to eat healthy; enjoys fried chicken, desserts occasionally -Current exercise habits: limited due to knee/foot pain; was doing water aerobics before pandemic -Educated on Cholesterol goals; Benefits of statin for ASCVD risk reduction; Importance of limiting foods high in cholesterol; Exercise goal of 150 minutes per week; -Discussed drug interaction between esomeprazole and clopidogrel that can limit efficacy of clopidogrel (due to CYP219 competition); given stent placement in the past year it is advisable to switch to a different PPI (see GERD below) -Pt is overdue for annual lipid panel; he is due to follow up with cardiology April 2023, pt plans to call in March to make appt -Discussed possibility of adding ezetimibe if LDL remains > 70 -Recommended to continue current medication; repeat lipid panel at cardiology appt 05/2021  Depression/Anxiety (Goal: manage symptoms) -Controlled - pt thinks he does "pretty well" most of the time; he reports he has never been able to tell how much Lexapro is helping; he reports low motivation to do things, whereas most of his life he has been a "self-motivated individual" -Pt is recovering alcoholic, sober for 29 years; he reports social life is fairly active  -PHQ9: 0 (04/2020) - minimal depression -GAD7: 4 (01/2021) - minimal anxiety -Connected with PCP for mental health support -Current treatment: Escitalopram 10 mg daily -Appropriate, Effective, Safe, Accessible -Medications previously tried/failed: hydroxyzine -Educated on Benefits of medication for symptom control; discussed abruptly stopping medication can lead to worsening depression -Recommended to  continue current medication  GERD (Goal: manage symptoms) -Controlled - pt repots pantoprazole was switched to esomeprazole ~4 months ago due to pantoprazole not as effective, he reports esomeprazole has been more effective; he takes this at noon separated from clopidogrel at night -Current treatment  Esomeprazole 20 mg daily 12p -Appropriate, Effective, Query Safe (DDI with clopidogrel) -Discussed drug interaction between clopidogrel and esomeprazole - competition for CYP219 limits conversion of clopidogrel to active metabolites, thereby limiting its efficacy; unfortunately just separating the timing of these meds does NOT mitigate the interaction; it is advisable to switch to pantoprazole which does not have this interaction -Recommend to switch esomeprazole to pantoprazole 40 mg daily  BPH (Goal: manage symptoms) -Controlled -wakes up 1-2 times per night to urinate -Current treatment  Tamsulosin 0.4 mg daily -Appropriate, Effective, Safe, Accessible -Recommended to continue current medication  Osteoarthritis / Tendinitis (Goal: manage pain) -Controlled - pt reports using turmeric often, tylenol rarely, and ibuprofen very rarely for persistent pain only -Current treatment  Turmeric -Appropriate, Effective, Safe, Accessible Tylenol 325 mg PRN -Appropriate, Effective, Safe, Accessible Ibuprofen 200-400 mg PRN -Appropriate, Effective, Query Safe -Discussed risks of NSAID use including kidney damage, high BP, and bleeding; advised to limit NSAID as much as possible, use Tylenol for pain  Health Maintenance -Vaccine  gaps: covid booster, TDAP  Patient Goals/Self-Care Activities Patient will:  - take medications as prescribed as evidenced by patient report and record review focus on medication adherence by routine check blood pressure daily, document, and provide at future appointments      John Salazar was given information about Chronic Care Management services today including:  CCM  service includes personalized support from designated clinical staff supervised by his physician, including individualized plan of care and coordination with other care providers 24/7 contact phone numbers for assistance for urgent and routine care needs. Standard insurance, coinsurance, copays and deductibles apply for chronic care management only during months in which we provide at least 20 minutes of these services. Most insurances cover these services at 100%, however patients may be responsible for any copay, coinsurance and/or deductible if applicable. This service may help you avoid the need for more expensive face-to-face services. Only one practitioner may furnish and bill the service in a calendar month. The patient may stop CCM services at any time (effective at the end of the month) by phone call to the office staff.  Patient agreed to services and verbal consent obtained.   Patient verbalizes understanding of instructions and care plan provided today and agrees to view in [place]. Active MyChart status confirmed with patient.   Telephone follow up appointment with pharmacy team member scheduled for: 1 month  [name], PharmD, [facility] Clinical Pharmacist [facility] [phone]

## 2021-03-23 ENCOUNTER — Other Ambulatory Visit: Payer: PPO

## 2021-03-23 DIAGNOSIS — R1013 Epigastric pain: Secondary | ICD-10-CM

## 2021-03-23 DIAGNOSIS — R140 Abdominal distension (gaseous): Secondary | ICD-10-CM

## 2021-03-25 LAB — H. PYLORI ANTIGEN, STOOL: H pylori Ag, Stl: NEGATIVE

## 2021-03-25 MED ORDER — PANTOPRAZOLE SODIUM 40 MG PO TBEC: 40.0000 mg | DELAYED_RELEASE_TABLET | Freq: Every day | ORAL | 1 refills | Status: DC

## 2021-03-25 NOTE — Addendum Note (Signed)
Addended by: Charlton Haws on: 03/25/2021 04:30 PM   Modules accepted: Orders

## 2021-03-25 NOTE — Progress Notes (Signed)
Mr. Ewbank,  Your H. Pylori test was negative.  Please complete the breath test to evaluate for SIBO.

## 2021-03-25 NOTE — Progress Notes (Signed)
PCP approved change from esomeprazole to pantoprazole. Ordered pantoprazole 40 mg to pharmacy.

## 2021-04-01 ENCOUNTER — Other Ambulatory Visit: Payer: Self-pay | Admitting: Family Medicine

## 2021-04-05 ENCOUNTER — Other Ambulatory Visit: Payer: Self-pay | Admitting: Internal Medicine

## 2021-04-06 DIAGNOSIS — E782 Mixed hyperlipidemia: Secondary | ICD-10-CM | POA: Diagnosis not present

## 2021-04-06 DIAGNOSIS — F320 Major depressive disorder, single episode, mild: Secondary | ICD-10-CM

## 2021-04-06 DIAGNOSIS — I10 Essential (primary) hypertension: Secondary | ICD-10-CM

## 2021-04-06 DIAGNOSIS — N400 Enlarged prostate without lower urinary tract symptoms: Secondary | ICD-10-CM

## 2021-04-12 ENCOUNTER — Other Ambulatory Visit: Payer: Self-pay

## 2021-04-12 ENCOUNTER — Ambulatory Visit (INDEPENDENT_AMBULATORY_CARE_PROVIDER_SITE_OTHER): Payer: PPO | Admitting: Pharmacist

## 2021-04-12 ENCOUNTER — Telehealth: Payer: Self-pay | Admitting: Internal Medicine

## 2021-04-12 DIAGNOSIS — E782 Mixed hyperlipidemia: Secondary | ICD-10-CM

## 2021-04-12 DIAGNOSIS — N1832 Chronic kidney disease, stage 3b: Secondary | ICD-10-CM

## 2021-04-12 DIAGNOSIS — K2270 Barrett's esophagus without dysplasia: Secondary | ICD-10-CM

## 2021-04-12 DIAGNOSIS — I2510 Atherosclerotic heart disease of native coronary artery without angina pectoris: Secondary | ICD-10-CM

## 2021-04-12 DIAGNOSIS — F320 Major depressive disorder, single episode, mild: Secondary | ICD-10-CM

## 2021-04-12 DIAGNOSIS — I10 Essential (primary) hypertension: Secondary | ICD-10-CM

## 2021-04-12 NOTE — Progress Notes (Signed)
Chronic Care Management Pharmacy Note  04/13/2021 Name:  John Salazar MRN:  [id number] DOB:  [birth year]  Summary: CCM F/U visit -Pt has been taking losartan 100 mg HS and amlodipine 5 mg AM, BP has been labile with many readings > 130/80. Given CKD Stage 3b and ASCVD, goal BP < 130/80 -Pt is interested in additional treatment for cholesterol/ASCVD risk reduction. Discussed ezetimibe and PCSK9-inhibitors. He has been following with cardiology for lipids and is overdue for OV. LDL 77 (01/2020)  Recommendations/Changes made from today's visit: -Move amlodipine to bedtime with losartan -Advised pt to make appt with cardiology and repeat lipid panel; defer med changes to cardiology  Plan: -Pharmacist follow up televisit scheduled for 1 month -Due for PCP annual exam 11/2021   Subjective: John Salazar is an 76 y.o. year old male who is a primary patient of Cody, Jobe Marker, MD.  The CCM team was consulted for assistance with disease management and care coordination needs.    Engaged with patient by telephone for initial visit in response to provider referral for pharmacy case management and/or care coordination services.   Consent to Services:  The patient was given the following information about Chronic Care Management services today, agreed to services, and gave verbal consent: 1. CCM service includes personalized support from designated clinical staff supervised by the primary care provider, including individualized plan of care and coordination with other care providers 2. 24/7 contact phone numbers for assistance for urgent and routine care needs. 3. Service will only be billed when office clinical staff spend 20 minutes or more in a month to coordinate care. 4. Only one practitioner may furnish and bill the service in a calendar month. 5.The patient may stop CCM services at any time (effective at the end of the month) by phone call to the office staff. 6. The patient will be responsible  for cost sharing (co-pay) of up to 20% of the service fee (after annual deductible is met). Patient agreed to services and consent obtained.  Patient Care Team: Lesleigh Noe, MD as PCP - General (Family Medicine) Fay Records, MD as PCP - Cardiology (Cardiology) Marlou Sa Tonna Corner, MD as Consulting Physician (Orthopedic Surgery) Luanne Bras, MD as Consulting Physician (Interventional Radiology) Clarene Essex, MD as Consulting Physician (Gastroenterology) Charlton Haws, [facility] as Pharmacist (Pharmacist)  Recent office visits: 01/14/2021 - [name], NP - Patient Message - Patient tested positive for Covid. Start: Physicist, medical. 01/13/2021 - [name], NP - Patient presented for Covid exposure and cough. Patient was advised to test for Covid.  11/23/2020 - [name], MD - Patient presented for abdominal pain and diarrhea. Labs: CBC, CMP, Celiac, TSH and Uric Acid. Start: esomeprazole (NEXIUM) 20 MG capsule. Stop: pantoprazole (PROTONIX) 40 MG tablet. Immunizations: [employer] (high dose 65+).  11/17/2020 - [name], MD - Patient Message - Patient presented for right heel pain. Recommended: Voltaren gel or Ibuprofen/Tylenol.   11/11/2020 - [name], MD - Patient Message - Referral to Gastroenterology.   Recent consult visits: 02/17/2021 - [name], DPM - Podiatry - Patient presented for achilles tendinitis of left foot. Administered: Kenalog injection. 01/05/2021 - [name] - Dermatology - Patient presented for basal cell carcinoma of nose.  12/18/2020 - [name], MD - Gastroenterology - Patient presented for gas and bloating. Ordered: H. Pylori.  12/16/2020 - [name], DPM - Podiatry - Patient presented for achilles tendinitis of right foot. Administered: Kenalog injection. 11/20/2020 - [name], DPM -  Podiatry - Patient presented for foot pain. Orders: DG Foot 2 view right. Administered: Kenalog injection. 10/27/2020 - [name], Specialist - Patient presented for follow up exam after completed treatment of malignant neoplasm. Procedure: Skin biopsy single lesion.  10/27/2020 - [name] - Pathology - Patient presented for basal cell carcinoma of skin of nose. Procedure: CHG Surgical; path. Level IV.  10/15/2020 - [name] - Patient presented for sensorineural hearing loss, bilateral. Procedure: assessment for hearing aid.  10/13/2020 - [name], RPH-CPP - Cardiology - Telephone - patient self adjusted meds: losartan 1/2 tab and amlodipine 1/2 tab. BP at goal.  Hospital visits: None in previous 6 months   Objective:  Lab Results  Component Value Date   CREATININE 1.68 (H) 11/23/2020   BUN 22 11/23/2020   GFR 39.32 (L) 11/23/2020   GFRNONAA 48 (L) 06/30/2020   GFRAA 52 (L) 01/13/2020   NA 139 11/23/2020   K 4.0 11/23/2020   CALCIUM 9.6 11/23/2020   CO2 28 11/23/2020   GLUCOSE 60 (L) 11/23/2020    Lab Results  Component Value Date/Time   HGBA1C 5.8 04/09/2018 09:59 AM   HGBA1C 5.7 (H) 04/19/2017 08:29 AM   GFR 39.32 (L) 11/23/2020 09:06 AM   GFR 46.38 (L) 05/17/2019 07:29 AM    Last diabetic Eye exam: No results found for: HMDIABEYEEXA  Last diabetic Foot exam: No results found for: HMDIABFOOTEX   Lab Results  Component Value Date   CHOL 160 05/17/2019   HDL 65.80 05/17/2019   LDLCALC 77 05/17/2019   TRIG 85.0 05/17/2019   CHOLHDL 2 05/17/2019    Hepatic Function Latest Ref Rng & Units 11/23/2020 05/04/2020 05/17/2019  Total Protein 6.0 - 8.3 g/dL 6.9 6.6 7.0  Albumin 3.5 - 5.2 g/dL 4.4 3.7 4.4  AST 0 - 37 U/L 20 18 19   ALT 0 - 53 U/L 17 13 22   Alk Phosphatase 39 - 117 U/L 74 73 77  Total Bilirubin 0.2 - 1.2 mg/dL 0.5 0.6 0.7    Lab Results  Component Value Date/Time   TSH 2.43 11/23/2020 09:06 AM   TSH 1.11 02/11/2019 02:26 PM    CBC Latest Ref Rng & Units 11/23/2020 06/30/2020 06/26/2020  WBC 4.0 - 10.5 K/uL 4.9 7.4 4.9  Hemoglobin 13.0 - 17.0 g/dL 13.1 11.0(L) 13.5   Hematocrit 39.0 - 52.0 % 38.9(L) 32.2(L) 40.8  Platelets 150.0 - 400.0 K/uL 174.0 143(L) 165    Lab Results  Component Value Date/Time   VD25OH 31.70 02/11/2019 02:26 PM    Clinical ASCVD: Yes  - angina The 10-year ASCVD risk score (Arnett DK, et al., 2019) is: 26.2%   Values used to calculate the score:     Age: 76 years     Sex: Male     Is Non-Hispanic African American: No     Diabetic: No     Tobacco smoker: No     Systolic Blood Pressure: 009 mmHg     Is BP treated: Yes     HDL Cholesterol: 65.8 mg/dL     Total Cholesterol: 152 mg/dL    Depression screen [facility] 2/9 04/29/2020 04/29/2020 02/27/2019  Decreased Interest 0 0 1  Down, Depressed, Hopeless 0 0 0  PHQ - 2 Score 0 0 1  Altered sleeping - - 0  Tired, decreased energy - - 1  Change in appetite - - 0  Feeling bad or failure about yourself  - - 0  Trouble concentrating - - 0  Moving  slowly or fidgety/restless - - 0  Suicidal thoughts - - 0  PHQ-9 Score - - 2  Difficult doing work/chores - - Not difficult at all     Social History   Tobacco Use  Smoking Status Former   Packs/day: 2.00   Years: 25.00   Pack years: 50.00   Types: Cigarettes   Quit date: 03/10/1996   Years since quitting: 25.1  Smokeless Tobacco Never   BP Readings from Last 3 Encounters:  01/13/21 129/67  12/18/20 128/62  11/23/20 118/78   Pulse Readings from Last 3 Encounters:  01/13/21 88  12/18/20 64  11/23/20 84   Wt Readings from Last 3 Encounters:  12/18/20 170 lb (77.1 kg)  11/23/20 171 lb 8 oz (77.8 kg)  06/29/20 170 lb (77.1 kg)   BMI Readings from Last 3 Encounters:  12/18/20 25.10 kg/m  11/23/20 25.33 kg/m  06/29/20 25.10 kg/m    Assessment/Interventions: Review of patient past medical history, allergies, medications, health status, including review of consultants reports, laboratory and other test data, was performed as part of comprehensive evaluation and provision of chronic care management services.   SDOH:   (Social Determinants of Health) assessments and interventions performed: Yes   SDOH Screenings   Alcohol Screen: Not on file  Depression (PHQ2-9): Low Risk    PHQ-2 Score: 0  Financial Resource Strain: Low Risk    Difficulty of Paying Living Expenses: Not very hard  Food Insecurity: No Food Insecurity   Worried About Charity fundraiser in the Last Year: Never true   Ran Out of Food in the Last Year: Never true  Housing: Not on file  Physical Activity: Not on file  Social Connections: Not on file  Stress: Not on file  Tobacco Use: Medium Risk   Smoking Tobacco Use: Former   Smokeless Tobacco Use: Never   Passive Exposure: Not on file  Transportation Needs: Not on file    [place]  Allergies  Allergen Reactions   Bee Venom Anaphylaxis and Shortness Of Breath   Penicillins Rash    PATIENT HAS HAD A PCN REACTION WITH IMMEDIATE RASH, FACIAL/TONGUE/THROAT SWELLING, SOB, OR LIGHTHEADEDNESS WITH HYPOTENSION:  #  #  YES  #  #  Has patient had a PCN reaction causing severe rash involving mucus membranes or skin necrosis: no Has patient had a PCN reaction that required hospitalization: no Has patient had a PCN reaction occurring within the last 10 years: no     Medications Reviewed Today     Reviewed by Charlton Haws, [facility] (Pharmacist) on 04/13/21 at [place] List Status: <None>   Medication Order Taking? Sig Documenting Provider Last Dose Status Informant  acetaminophen (TYLENOL) 325 MG tablet [id number] Yes Take 650 mg by mouth every 6 (six) hours as needed (pain). [provider] Taking Active Self  amLODipine (NORVASC) 10 MG tablet [id number] Yes Take 5 mg by mouth daily. Fay Records, MD Taking Active   aspirin EC 81 MG tablet [id number] Yes Take 81 mg by mouth in the morning. [provider] Taking Active Self  clopidogrel (PLAVIX) 75 MG tablet [id number] Yes TAKE ONE TABLET BY MOUTH DAILY **NEED OFFICE VISITFay Records, MD Taking Active    escitalopram (LEXAPRO) 10 MG tablet [id number] Yes TAKE ONE TABLET ONCE DAILY Lesleigh Noe, MD Taking Active   famotidine (PEPCID) 20 MG tablet [id number] Yes Take 20 mg by mouth daily as needed for heartburn or indigestion. [provider]  Taking Active   isosorbide mononitrate (IMDUR) 60 MG 24 hr tablet [id number] Yes TAKE 1 TABLET BY MOUTH DAILY.  Patient taking differently: 30 mg.   Fay Records, MD Taking Active   losartan (COZAAR) 100 MG tablet [id number] Yes Take 100 mg by mouth daily. Fay Records, MD Taking Active   nitroGLYCERIN (NITROSTAT) 0.4 MG SL tablet [id number]  Place 1 tablet (0.4 mg total) under the tongue every 5 (five) minutes as needed for chest pain. Nelva Bush, MD  Expired 01/13/21 2359 Self           Med Note Gar Ponto   Mon May 11, 2020  9:56 AM)    pantoprazole (PROTONIX) 40 MG tablet [id number] Yes Take 1 tablet (40 mg total) by mouth daily. Lesleigh Noe, MD Taking Active   rosuvastatin (CRESTOR) 40 MG tablet [id number] Yes Take one (1) tablet by mouth ( 40 mg) daily. Patient needs appointment for any future refills. Please call office at [phone] to schedule appointment. 1st attempt. Fay Records, MD Taking Active   tamsulosin [facility]) 0.4 MG CAPS capsule [id number] Yes Take 1 capsule (0.4 mg total) by mouth at bedtime. Lesleigh Noe, MD Taking Active   Turmeric 500 MG TABS [id number] Yes Take by mouth. [provider] Taking Active             Patient Active Problem List   Diagnosis Date Noted   Acute cough 01/13/2021   Fever 01/13/2021   Exposure to COVID-19 virus 01/13/2021   Arthralgia of right foot 11/23/2020   Chronic diarrhea 11/23/2020   Internal carotid artery stenosis, right 06/29/2020   Brain aneurysm 05/18/2020   Depression, major, single episode, mild ([place]) 02/27/2019   Snoring 02/27/2019   Accelerating angina ([place]) 11/15/2018   DNR (do not resuscitate) 08/23/2018   Hx of cataract surgery 08/23/2018    History of right common carotid artery stent placement 06/18/2018   Screening for prostate cancer 06/18/2018   Prediabetes 03/19/2018   CKD (chronic kidney disease) stage 3, GFR 30-59 ml/min ([place]) 02/05/2018   Barrett esophagus 01/08/2018   Benign essential HTN 01/08/2018   BPH (benign prostatic hyperplasia) 01/08/2018   HLD (hyperlipidemia) 01/08/2018   Anemia 01/08/2018   Carotid stenosis, symptomatic w/o infarct, right 08/23/2017    Immunization History  Administered Date(s) Administered   Fluad Quad(high Dose 65+) 10/04/2018, 12/07/2019, 11/23/2020   Influenza, High Dose Seasonal PF 11/07/2017   PFIZER(Purple Top)SARS-COV-2 Vaccination 03/16/2019, 04/08/2019, 08/18/2020   Pneumococcal Conjugate-13 07/26/2013   Pneumococcal Polysaccharide-23 11/19/2009   Pneumococcal-Unspecified 03/24/2017   Tdap 11/12/2010   Zoster Recombinat (Shingrix) 09/27/2018, 12/27/2018    Conditions to be addressed/monitored:  Hypertension, Hyperlipidemia, Coronary Artery Disease, GERD, Chronic Kidney Disease, Depression, and BPH  Care Plan : [place]  Updates made by Charlton Haws, [place] since 04/13/2021 12:00 AM     Problem: Hypertension, Hyperlipidemia, Coronary Artery Disease, GERD, Chronic Kidney Disease, Depression, and BPH   Priority: High     Long-Range Goal: Disease mgmt   Start Date: 03/22/2021  Expected End Date: 03/22/2022  This Visit's Progress: On track  Recent Progress: On track  Priority: High  Note:   Current Barriers:  Unable to independently monitor therapeutic efficacy Suboptimal therapeutic regimen for  BP  Pharmacist Clinical Goal(s):  Patient will achieve adherence to monitoring guidelines and medication adherence to achieve therapeutic efficacy adhere to plan to optimize therapeutic regimen for BP as evidenced by report of adherence to recommended medication management  changes through collaboration with PharmD and provider.   Interventions: 1:1  collaboration with Lesleigh Noe, MD regarding development and update of comprehensive plan of care as evidenced by provider attestation and co-signature Inter-disciplinary care team collaboration (see longitudinal plan of care) Comprehensive medication review performed; medication list updated in electronic medical record  Hypertension / CKD stage 3b (BP goal <130/80) -Not ideally controlled - pt has hx of labile BP, he self-adjusts medications; last month he started taking 1/2 losartan dose (50 mg) at bedtime and amlodipine in AM to see if BP stabilized; he quickly increased his losartan back to 100 mg (whole tablet) when BP was elevated - the following readings reflect amlodipine 5 mg AM and losartan 100 mg PM (and isosorbide 30 mg in AM) -Current home readings: 2/15: 126/80 2/16: 109/69 2/17: 113/73 2/18: 108/73 2/21: 144/86 104/64 2/23: 140/85 2/24: 131/75 2/25: 136/79 2/26: 137/85 87/57 2/27: 143/83  2/28: 131/85 109/78 3/2: 148/86  3/3 108/67 3/4 111/73 3/6 140/83 -Current treatment: Amlodipine 5 mg daily AM - Appropriate, Query Effective Losartan 100 mg daily HS - Appropriate, Query  Effective Isosorbide MN 60 mg -1/2 tab daily - Appropriate, Effective, Safe, Accessible -Medications previously tried: n/a  -Educated on BP goals and benefits of medications for prevention of heart attack, stroke and kidney damage; Symptoms of hypotension and importance of maintaining adequate hydration; -Counseled to monitor BP at home daily, document, and provide log at future appointments -Advised to take both amlodipine and losartan at bedtime  Hyperlipidemia: (LDL goal < 70) -Not ideally controlled - LDL is slightly above goal, but last checked 01/2020; he endorses compliance with medications as below; he is interested in further treatment to improve his cholesterol/ASCVD risk -Hx angina; hx brain aneurysm w/ stent 06/2020; managed per Dr Harrington Challenger -Current treatment: Rosuvastatin 40 mg daily  HS - Appropriate, Query Effective, Safe, Accessible Isosorbide MN 60 mg - 1/2 tab daily - Appropriate, Effective, Safe, Accessible Clopidogrel 75 mg daily - Appropriate, Query Effective (drug interaction with esomeprazole) Nitroglycerin 0.4 mg SL prn - Appropriate, Effective, Safe, Accessible Aspirin 81 mg daily -Appropriate, Effective, Safe, Accessible -Current dietary patterns: tries to eat healthy; enjoys fried chicken, desserts occasionally -Current exercise habits: limited due to knee/foot pain; was doing water aerobics before pandemic -Discussed possible cholesterol therapy including ezetimibe and PCSK9-inhibitors; cardiology handles his cholesterol medications and he is over due for OV with Dr Harrington Challenger - advised pt make appt with Dr Harrington Challenger and repeat lipid panel, defer to cardiology for further med changes  Depression/Anxiety (Goal: manage symptoms) -Controlled - pt thinks he does "pretty well" most of the time; he reports he has never been able to tell how much Lexapro is helping; he reports low motivation to do things, whereas most of his life he has been a "self-motivated individual" -Pt is recovering alcoholic, sober for 29 years; he reports social life is fairly active  -PHQ9: 0 (04/2020) - minimal depression -GAD7: 4 (01/2021) - minimal anxiety -Connected with PCP for mental health support -Current treatment: Escitalopram 10 mg daily -Appropriate, Effective, Safe, Accessible -Medications previously tried/failed: hydroxyzine -Educated on Benefits of medication for symptom control; discussed abruptly stopping medication can lead to worsening depression -Recommended to continue current medication  GERD (Goal: manage symptoms) -Controlled - pt switched from esomeprazole to pantoprazole last month (due to DDI with clopidogrel) and reports symptoms are controlled -Current treatment  Pantoprazole 40 mg daily 12p - Appropriate, Effective, Safe, Accessible Famotidine 20 mg PRN - Appropriate,  Effective, Safe, Accessible -Recommend  to continue current medication  Osteoarthritis / Tendinitis (Goal: manage pain) -Controlled - pt reports using turmeric often, tylenol rarely, and ibuprofen very rarely for persistent pain only -Current treatment  Turmeric -Appropriate, Effective, Safe, Accessible Tylenol 325 mg PRN -Appropriate, Effective, Safe, Accessible Ibuprofen 200-400 mg PRN -Appropriate, Effective, Query Safe -Discussed risks of NSAID use including kidney damage, high BP, and bleeding; advised to limit NSAID as much as possible, use Tylenol for pain  Health Maintenance -Vaccine gaps: covid booster, TDAP  Patient Goals/Self-Care Activities Patient will:  - take medications as prescribed as evidenced by patient report and record review focus on medication adherence by routine check blood pressure daily, document, and provide at future appointments       Medication Assistance: None required.  Patient affirms current coverage meets needs.  Compliance/Adherence/Medication fill history: Care Gaps: NONE  Star-Rating Drugs: Rosuvastatin - LF 01/11/21 X 90 DS; [place] 98%  Patient's preferred pharmacy is:  [facility], [place] [address] Phone: [phone] Fax: [phone]  Uses pill box? No -   uses bathroom drawers for AM/PM meds Pt endorses 100% compliance  We discussed: Current pharmacy is preferred with insurance plan and patient is satisfied with pharmacy services Patient decided to: Continue current medication management strategy  Care Plan and Follow Up Patient Decision:  Patient agrees to Care Plan and Follow-up.  Plan: Telephone follow up appointment with care management team member scheduled for:  1 month  [name], PharmD, [facility] Clinical Pharmacist [facility] [phone]

## 2021-04-12 NOTE — Telephone Encounter (Signed)
Patient is calling stating his PCP advised him that if he began taking Zetia it could drop his high cholesterol down drastically. He would like an order to be placed for his cholesterol levels to be checked so starting this medication can be discussed at his upcoming appointment on 03/14 with Tessa. Patient would like a callback once order is placed so it can be scheduled. Please advise.  ?

## 2021-04-13 NOTE — Telephone Encounter (Signed)
Sure, we can order a lipid panel for 3/13 that way results are available to discuss at his appointment.  ? ?Thank you,  ? ?Elgie Collard, PA-C ?

## 2021-04-13 NOTE — Telephone Encounter (Signed)
Called pt to schedule lab appointment.  Pt will come in on 04/14/21 for FLP, ALT.  Orders placed.  ?

## 2021-04-13 NOTE — Patient Instructions (Addendum)
Visit Information ? ?Phone number for Pharmacist: [phone] ? ? Goals Addressed   ? ?  ?  ?  ?  ? This Visit's Progress  ?  Track and Manage My Blood Pressure-Hypertension     ?  Timeframe:  Long-Range Goal ?Priority:  Medium ?Start Date:     03/22/21                        ?Expected End Date:   03/22/22                   ? ?Follow Up Date April 2023 ?  ?- check blood pressure daily ?- choose a place to take my blood pressure (home, clinic or office, retail store) ?- write blood pressure results in a log or diary  ?  ?Why is this important?   ?You won't feel high blood pressure, but it can still hurt your blood vessels.  ?High blood pressure can cause heart or kidney problems. It can also cause a stroke.  ?Making lifestyle changes like losing a little weight or eating less salt will help.  ?Checking your blood pressure at home and at different times of the day can help to control blood pressure.  ?If the doctor prescribes medicine remember to take it the way the doctor ordered.  ?Call the office if you cannot afford the medicine or if there are questions about it.   ?  ?Notes:  ?  ? ?  ? ? ?Care Plan : [place]  ?Updates made by Charlton Haws, RPH since 04/13/2021 12:00 AM  ?  ? ?Problem: Hypertension, Hyperlipidemia, Coronary Artery Disease, GERD, Chronic Kidney Disease, Depression, and BPH   ?Priority: High  ?  ? ?Long-Range Goal: Disease mgmt   ?Start Date: 03/22/2021  ?Expected End Date: 03/22/2022  ?This Visit's Progress: On track  ?Recent Progress: On track  ?Priority: High  ?Note:   ?Current Barriers:  ?Unable to independently monitor therapeutic efficacy ?Suboptimal therapeutic regimen for  BP ? ?Pharmacist Clinical Goal(s):  ?Patient will achieve adherence to monitoring guidelines and medication adherence to achieve therapeutic efficacy ?adhere to plan to optimize therapeutic regimen for BP as evidenced by report of adherence to recommended medication management changes through  collaboration with PharmD and provider.  ? ?Interventions: ?1:1 collaboration with Lesleigh Noe, MD regarding development and update of comprehensive plan of care as evidenced by provider attestation and co-signature ?Inter-disciplinary care team collaboration (see longitudinal plan of care) ?Comprehensive medication review performed; medication list updated in electronic medical record ? ?Hypertension / CKD stage 3b (BP goal <130/80) ?-Not ideally controlled - pt has hx of labile BP, he self-adjusts medications; last month he started taking 1/2 losartan dose (50 mg) at bedtime and amlodipine in AM to see if BP stabilized; he quickly increased his losartan back to 100 mg (whole tablet) when BP was elevated - the following readings reflect amlodipine 5 mg AM and losartan 100 mg PM (and isosorbide 30 mg in AM) ?-Current home readings: ?2/15: 126/80 ?2/16: 109/69 ?2/17: 113/73 ?2/18: 108/73 ?2/21: 144/86 104/64 ?2/23: 140/85 ?2/24: 131/75 ?2/25: 136/79 ?2/26: 137/85 87/57 ?2/27: 143/83  ?2/28: 131/85 109/78 ?3/2: 148/86  ?3/3 108/67 ?3/4 111/73 ?3/6 140/83 ?-Current treatment: ?Amlodipine 5 mg daily AM - Appropriate, Query Effective ?Losartan 100 mg daily HS - Appropriate, Query  Effective ?Isosorbide MN 60 mg -1/2 tab daily - Appropriate, Effective, Safe, Accessible ?-Medications previously tried: n/a  ?-Educated on BP  goals and benefits of medications for prevention of heart attack, stroke and kidney damage; Symptoms of hypotension and importance of maintaining adequate hydration; ?-Counseled to monitor BP at home daily, document, and provide log at future appointments ?-Advised to take both amlodipine and losartan at bedtime ? ?Hyperlipidemia: (LDL goal < 70) ?-Not ideally controlled - LDL is slightly above goal, but last checked 01/2020; he endorses compliance with medications as below; he is interested in further treatment to improve his cholesterol/ASCVD risk ?-Hx angina; hx brain aneurysm w/ stent 06/2020;  managed per Dr Harrington Challenger ?-Current treatment: ?Rosuvastatin 40 mg daily HS - Appropriate, Query Effective, Safe, Accessible ?Isosorbide MN 60 mg - 1/2 tab daily - Appropriate, Effective, Safe, Accessible ?Clopidogrel 75 mg daily - Appropriate, Query Effective (drug interaction with esomeprazole) ?Nitroglycerin 0.4 mg SL prn - Appropriate, Effective, Safe, Accessible ?Aspirin 81 mg daily -Appropriate, Effective, Safe, Accessible ?-Current dietary patterns: tries to eat healthy; enjoys fried chicken, desserts occasionally ?-Current exercise habits: limited due to knee/foot pain; was doing water aerobics before pandemic ?-Discussed possible cholesterol therapy including ezetimibe and PCSK9-inhibitors; cardiology handles his cholesterol medications and he is over due for OV with Dr Harrington Challenger - advised pt make appt with Dr Harrington Challenger and repeat lipid panel, defer to cardiology for further med changes ? ?Depression/Anxiety (Goal: manage symptoms) ?-Controlled - pt thinks he does "pretty well" most of the time; he reports he has never been able to tell how much Lexapro is helping; he reports low motivation to do things, whereas most of his life he has been a "self-motivated individual" ?-Pt is recovering alcoholic, sober for 29 years; he reports social life is fairly active  ?-PHQ9: 0 (04/2020) - minimal depression ?-GAD7: 4 (01/2021) - minimal anxiety ?-Connected with PCP for mental health support ?-Current treatment: ?Escitalopram 10 mg daily -Appropriate, Effective, Safe, Accessible ?-Medications previously tried/failed: hydroxyzine ?-Educated on Benefits of medication for symptom control; discussed abruptly stopping medication can lead to worsening depression ?-Recommended to continue current medication ? ?GERD (Goal: manage symptoms) ?-Controlled - pt switched from esomeprazole to pantoprazole last month (due to DDI with clopidogrel) and reports symptoms are controlled ?-Current treatment  ?Pantoprazole 40 mg daily 12p - Appropriate,  Effective, Safe, Accessible ?Famotidine 20 mg PRN - Appropriate, Effective, Safe, Accessible ?-Recommend to continue current medication ? ?Osteoarthritis / Tendinitis (Goal: manage pain) ?-Controlled - pt reports using turmeric often, tylenol rarely, and ibuprofen very rarely for persistent pain only ?-Current treatment  ?Turmeric -Appropriate, Effective, Safe, Accessible ?Tylenol 325 mg PRN -Appropriate, Effective, Safe, Accessible ?Ibuprofen 200-400 mg PRN -Appropriate, Effective, Query Safe ?-Discussed risks of NSAID use including kidney damage, high BP, and bleeding; advised to limit NSAID as much as possible, use Tylenol for pain ? ?Health Maintenance ?-Vaccine gaps: covid booster, TDAP ? ?Patient Goals/Self-Care Activities ?Patient will:  ?- take medications as prescribed as evidenced by patient report and record review ?focus on medication adherence by routine ?check blood pressure daily, document, and provide at future appointments ?  ?  ? ?Patient verbalizes understanding of instructions and care plan provided today and agrees to view in [place]. Active MyChart status confirmed with patient.   ?Telephone follow up appointment with pharmacy team member scheduled for: 1 month ? ?[name], PharmD, BCACP ?Clinical Pharmacist ?[facility] ?[phone] ?  ?

## 2021-04-14 ENCOUNTER — Other Ambulatory Visit: Payer: PPO

## 2021-04-16 ENCOUNTER — Telehealth: Payer: PPO

## 2021-04-19 ENCOUNTER — Other Ambulatory Visit: Payer: Self-pay

## 2021-04-19 ENCOUNTER — Other Ambulatory Visit: Payer: PPO

## 2021-04-19 DIAGNOSIS — E782 Mixed hyperlipidemia: Secondary | ICD-10-CM | POA: Diagnosis not present

## 2021-04-19 DIAGNOSIS — I2510 Atherosclerotic heart disease of native coronary artery without angina pectoris: Secondary | ICD-10-CM

## 2021-04-19 LAB — LIPID PANEL
Chol/HDL Ratio: 2.6 ratio (ref 0.0–5.0)
Cholesterol, Total: 124 mg/dL (ref 100–199)
HDL: 48 mg/dL (ref >39)
LDL Chol Calc (NIH): 55 mg/dL (ref 0–99)
Triglycerides: 119 mg/dL (ref 0–149)
VLDL Cholesterol Cal: 21 mg/dL (ref 5–40)

## 2021-04-19 LAB — ALT: ALT: 19 IU/L (ref 0–44)

## 2021-04-19 NOTE — Progress Notes (Unsigned)
Office Visit    Patient Name: John Salazar Date of Encounter: 04/19/2021  PCP:  Lesleigh Noe, [place]  Cardiologist:  Dorris Carnes, MD  Advanced Practice Provider:  No care team member to display Electrophysiologist:  None    Chief Complaint    John Salazar is a 76 y.o. male with a hx of hypertension, GERD, EtOH abuse and CV disease (status post PTA/stent 2019) presents today for annual follow-up appointment.  On 05/04/2020 patient was complaining of epigastric pain.  He was home at the time.  It lasted a while.  He ended up taking Gas-X in the shower.  He also took 1 nitroglycerin with no relief.  He called 911 and was taken to [facility] ED.  He was given a few more nitroglycerin on the way with no relief.  Overall, pain/discomfort lasted for few hours.  He stated it felt like chest pressure without any radiation.  Not pleuritic.  Patient ruled out for MI, BP was noted to be elevated.  His BP has been running high at home.  He was seen in the office May 11, 2020 by Dr. Harrington Challenger.  At that time he had not had further chest pain.  He was followed by Dr. Estanislado Pandy and was scheduled for an embolization of a small aneurysm on 05/18/2020.  No shortness of breath.  Today, he ***  Past Medical History    Past Medical History:  Diagnosis Date   Anxiety    Barrett esophagus    BPH (benign prostatic hyperplasia)    Cerebral aneurysm    Chronic kidney disease    stage 2   Colon polyps    Coronary artery disease    mild-mod non-obstructive CAD   Erectile dysfunction    GERD (gastroesophageal reflux disease)    History of kidney stones    History of stomach ulcers    Hypercholesteremia    Hypertension    Positive TB test    in the past   Past Surgical History:  Procedure Laterality Date   APPENDECTOMY  1970   CATARACT EXTRACTION W/ INTRAOCULAR LENS  IMPLANT, BILATERAL     COLONOSCOPY  10/2012   ESOPHAGOGASTRODUODENOSCOPY ENDOSCOPY  10/2012   HERNIA  REPAIR     IR 3D INDEPENDENT WKST  04/14/2020   IR ANGIO INTRA EXTRACRAN SEL COM CAROTID INNOMINATE BILAT MOD SED  08/09/2017   IR ANGIO INTRA EXTRACRAN SEL COM CAROTID INNOMINATE BILAT MOD SED  11/08/2018   IR ANGIO INTRA EXTRACRAN SEL COM CAROTID INNOMINATE BILAT MOD SED  04/14/2020   IR ANGIO INTRA EXTRACRAN SEL COM CAROTID INNOMINATE UNI L MOD SED  06/29/2020   IR ANGIO INTRA EXTRACRAN SEL INTERNAL CAROTID UNI L MOD SED  05/20/2020   IR ANGIO VERTEBRAL SEL VERTEBRAL BILAT MOD SED  08/09/2017   IR ANGIO VERTEBRAL SEL VERTEBRAL BILAT MOD SED  11/08/2018   IR ANGIO VERTEBRAL SEL VERTEBRAL BILAT MOD SED  04/14/2020   IR ANGIOGRAM FOLLOW UP STUDY  05/20/2020   IR INTRAVSC STENT CERV CAROTID W/EMB-PROT MOD SED INCL ANGIO  08/23/2017   IR INTRAVSC STENT CERV CAROTID W/EMB-PROT MOD SED INCL ANGIO  06/29/2020   IR RADIOLOGIST EVAL & MGMT  05/05/2020   IR RADIOLOGIST EVAL & MGMT  06/03/2020   IR RADIOLOGIST EVAL & MGMT  07/15/2020   IR TRANSCATH/EMBOLIZ  05/18/2020   IR US GUIDE VASC ACCESS RIGHT  04/14/2020   IR US GUIDE VASC ACCESS RIGHT  05/20/2020   IR US GUIDE VASC ACCESS RIGHT  06/29/2020   LEFT HEART CATH AND CORONARY ANGIOGRAPHY N/A 11/15/2018   Procedure: LEFT HEART CATH AND CORONARY ANGIOGRAPHY;  Surgeon: Nelva Bush, MD;  Location: [place] [facility];  Service: Cardiovascular;  Laterality: N/A;   PROSTATE SURGERY  1999   20 years ago, enlarged prostate   RADIOLOGY WITH ANESTHESIA N/A 08/23/2017   Procedure: IR WITH ANESTHESIA STENT PLACEMENT;  Surgeon: Luanne Bras, MD;  Location: [place];  Service: Radiology;  Laterality: N/A;   RADIOLOGY WITH ANESTHESIA N/A 05/18/2020   Procedure: IR WITH ANESTHESIA EMBOLIZATION;  Surgeon: Luanne Bras, MD;  Location: [place];  Service: Radiology;  Laterality: N/A;   RADIOLOGY WITH ANESTHESIA Left 06/29/2020   Procedure: RADIOLOGY WITH ANESTHESIA  LEFT CAROTID STENT PLACEMENT;  Surgeon: Luanne Bras, MD;  Location: [place];  Service: Radiology;  Laterality:  Left;    Allergies  Allergies  Allergen Reactions   Bee Venom Anaphylaxis and Shortness Of Breath   Penicillins Rash    PATIENT HAS HAD A PCN REACTION WITH IMMEDIATE RASH, FACIAL/TONGUE/THROAT SWELLING, SOB, OR LIGHTHEADEDNESS WITH HYPOTENSION:  #  #  YES  #  #  Has patient had a PCN reaction causing severe rash involving mucus membranes or skin necrosis: no Has patient had a PCN reaction that required hospitalization: no Has patient had a PCN reaction occurring within the last 10 years: no     EKGs/Labs/Other Studies Reviewed:   The following studies were reviewed today:   CATH:  11/15/18   Left Main  Vessel is large.  Dist LM lesion is 20% stenosed.  Left Anterior Descending  Vessel is moderate in size.  Ost LAD to Prox LAD lesion is 35% stenosed. The lesion is moderately calcified.  Prox LAD to Mid LAD lesion is 45% stenosed. The lesion is eccentric.  First Diagonal Branch  Vessel is small in size.  Left Circumflex  Vessel is moderate in size.  Ost Cx to Prox Cx lesion is 40% stenosed.  Mid Cx to Dist Cx lesion is 70% stenosed.  First Obtuse Marginal Branch  Vessel is moderate in size.  Lateral First Obtuse Marginal Branch  Vessel is small in size.  Lat 1st Mrg lesion is 80% stenosed.  Second Obtuse Marginal Branch  Vessel is small in size.  2nd Mrg lesion is 70% stenosed.  Third Obtuse Marginal Branch  Vessel is small in size.  Right Coronary Artery  Vessel is small. Anterior takeoff.  Prox RCA lesion is 40% stenosed.    EKG:  EKG is *** ordered today.  The ekg ordered today demonstrates ***  Recent Labs: 11/23/2020: ALT 17; BUN 22; Creatinine, Ser 1.68; Hemoglobin 13.1; Platelets 174.0; Potassium 4.0; Sodium 139; TSH 2.43  Recent Lipid Panel    Component Value Date/Time   CHOL 160 05/17/2019 0729   TRIG 85.0 05/17/2019 0729   HDL 65.80 05/17/2019 0729   CHOLHDL 2 05/17/2019 0729   VLDL 17.0 05/17/2019 0729   LDLCALC 77 05/17/2019 0729    Risk  Assessment/Calculations:  {Does this patient have ATRIAL FIBRILLATION?:[phone]}  Home Medications   No outpatient medications have been marked as taking for the 04/20/21 encounter (Appointment) with Elgie Collard, PA-C.     Review of Systems   ***   All other systems reviewed and are otherwise negative except as noted above.  Physical Exam    VS:  There were no vitals taken for this visit. , BMI There is no height  or weight on file to calculate BMI.  Wt Readings from Last 3 Encounters:  12/18/20 170 lb (77.1 kg)  11/23/20 171 lb 8 oz (77.8 kg)  06/29/20 170 lb (77.1 kg)     GEN: Well nourished, well developed, in no acute distress. HEENT: normal. Neck: Supple, no JVD, carotid bruits, or masses. Cardiac: ***RRR, no murmurs, rubs, or gallops. No clubbing, cyanosis, edema.  ***Radials/PT 2+ and equal bilaterally.  Respiratory:  ***Respirations regular and unlabored, clear to auscultation bilaterally. GI: Soft, nontender, nondistended. MS: No deformity or atrophy. Skin: Warm and dry, no rash. Neuro:  Strength and sensation are intact. Psych: Normal affect.  Assessment & Plan    CAD  Hypertension  Hyperlipidemia  Cerebrovascular disease -Status post embolization of small aneurysm on 05/18/2020       Disposition: Follow up {follow up:15908} with Dorris Carnes, MD or APP.  Signed, Elgie Collard, PA-C 04/19/2021, 1:27 PM [facility]

## 2021-04-20 ENCOUNTER — Ambulatory Visit: Payer: PPO | Admitting: Physician Assistant

## 2021-04-20 ENCOUNTER — Encounter: Payer: Self-pay | Admitting: Physician Assistant

## 2021-04-20 VITALS — BP 120/76 | HR 72 | Ht 69.0 in | Wt 171.4 lb

## 2021-04-20 DIAGNOSIS — I10 Essential (primary) hypertension: Secondary | ICD-10-CM

## 2021-04-20 DIAGNOSIS — E785 Hyperlipidemia, unspecified: Secondary | ICD-10-CM

## 2021-04-20 DIAGNOSIS — I2510 Atherosclerotic heart disease of native coronary artery without angina pectoris: Secondary | ICD-10-CM | POA: Diagnosis not present

## 2021-04-20 DIAGNOSIS — I679 Cerebrovascular disease, unspecified: Secondary | ICD-10-CM | POA: Diagnosis not present

## 2021-04-20 MED ORDER — ROSUVASTATIN CALCIUM 40 MG PO TABS: ORAL_TABLET | ORAL | 3 refills | Status: DC

## 2021-04-20 MED ORDER — ISOSORBIDE MONONITRATE ER 60 MG PO TB24: 30.0000 mg | ORAL_TABLET | Freq: Every day | ORAL | 3 refills | Status: DC

## 2021-04-20 NOTE — Patient Instructions (Signed)
Medication Instructions:  ?Your physician recommends that you continue on your current medications as directed. Please refer to the Current Medication list given to you today.  ?*If you need a refill on your cardiac medications before your next appointment, please call your pharmacy* ? ? ?Lab Work: ?None ?If you have labs (blood work) drawn today and your tests are completely normal, you will receive your results only by: ?MyChart Message (if you have MyChart) OR ?A paper copy in the mail ?If you have any lab test that is abnormal or we need to change your treatment, we will call you to review the results. ? ? ?Testing/Procedures: ?Your physician has requested that you have an echocardiogram. Echocardiography is a painless test that uses sound waves to create images of your heart. It provides your doctor with information about the size and shape of your heart and how well your heart?s chambers and valves are working. This procedure takes approximately one hour. There are no restrictions for this procedure.  ? ? ?Follow-Up: ?At [facility], you and your health needs are our priority.  As part of our continuing mission to provide you with exceptional heart care, we have created designated Provider Care Teams.  These Care Teams include your primary Cardiologist (physician) and Advanced Practice Providers (APPs -  Physician Assistants and Nurse Practitioners) who all work together to provide you with the care you need, when you need it. ? ? ?Your next appointment:   ?1 year(s) ? ?The format for your next appointment:   ?In Person ? ?Provider:   ?Dorris Carnes, MD { ? ?Other Instructions ?Contact Dr Arlean Hopping office to set up a follow up appointment  ?

## 2021-05-05 ENCOUNTER — Telehealth: Payer: Self-pay

## 2021-05-05 NOTE — Progress Notes (Signed)
? ? ?  Chronic Care Management ?Pharmacy Assistant  ? ?Name: John Salazar  MRN: [id number] DOB: 16-Oct-1945 ? ?Reason for Encounter: CCM Counsellor) ?  ?Medications: ?Outpatient Encounter Medications as of 05/05/2021  ?Medication Sig  ? acetaminophen (TYLENOL) 325 MG tablet Take 650 mg by mouth every 6 (six) hours as needed (pain).  ? amLODipine (NORVASC) 10 MG tablet Take 5 mg by mouth daily.  ? aspirin EC 81 MG tablet Take 81 mg by mouth in the morning.  ? clopidogrel (PLAVIX) 75 MG tablet TAKE ONE TABLET BY MOUTH DAILY **NEED OFFICE VISIT**  ? escitalopram (LEXAPRO) 10 MG tablet TAKE ONE TABLET ONCE DAILY  ? famotidine (PEPCID) 20 MG tablet Take 20 mg by mouth daily as needed for heartburn or indigestion.  ? isosorbide mononitrate (IMDUR) 60 MG 24 hr tablet Take 0.5 tablets (30 mg total) by mouth daily. May take an additional one-half tablet (30 mg) by mouth as needed for chest pain  ? losartan (COZAAR) 100 MG tablet Take 100 mg by mouth daily.  ? nitroGLYCERIN (NITROSTAT) 0.4 MG SL tablet Place 1 tablet (0.4 mg total) under the tongue every 5 (five) minutes as needed for chest pain.  ? pantoprazole (PROTONIX) 40 MG tablet Take 1 tablet (40 mg total) by mouth daily.  ? rosuvastatin (CRESTOR) 40 MG tablet Take one (1) tablet by mouth ( 40 mg) daily.  ? tamsulosin (FLOMAX) 0.4 MG CAPS capsule Take 1 capsule (0.4 mg total) by mouth at bedtime.  ? Turmeric 500 MG TABS Take by mouth.  ? ?No facility-administered encounter medications on file as of 05/05/2021.  ? ?John Salazar was contacted to remind of upcoming telephone visit with [name] on 05/10/2021 at 10:15. Patient was reminded to have any blood glucose and blood pressure readings available for review at appointment.  ? ?Patient confirmed appointment. ? ?Are you having any problems with your medications? No  ? ?Do you have any concerns you like to discuss with the pharmacist? No ? ?Star Rating Drugs: ?Medication:  Last Fill: Day  Supply ?Rosuvastatin 40 mg 04/10/2021 30 ? ?[name], CPP notified ? ?[name], RMA ?Clinical Pharmacy Assistant ?[phone] ? ? ? ? ?

## 2021-05-06 ENCOUNTER — Ambulatory Visit (HOSPITAL_COMMUNITY): Payer: PPO | Attending: Cardiology

## 2021-05-06 DIAGNOSIS — I2510 Atherosclerotic heart disease of native coronary artery without angina pectoris: Secondary | ICD-10-CM | POA: Insufficient documentation

## 2021-05-06 LAB — ECHOCARDIOGRAM COMPLETE
Area-P 1/2: 3.37 cm2
S' Lateral: 2.3 cm

## 2021-05-07 DIAGNOSIS — I10 Essential (primary) hypertension: Secondary | ICD-10-CM

## 2021-05-07 DIAGNOSIS — E782 Mixed hyperlipidemia: Secondary | ICD-10-CM | POA: Diagnosis not present

## 2021-05-07 DIAGNOSIS — F320 Major depressive disorder, single episode, mild: Secondary | ICD-10-CM | POA: Diagnosis not present

## 2021-05-10 ENCOUNTER — Telehealth: Payer: Self-pay

## 2021-05-10 ENCOUNTER — Telehealth: Payer: PPO

## 2021-05-10 NOTE — Chronic Care Management (AMB) (Signed)
Unable to reach [place]. Message left with new appointment for 05/24/21 '@11'$ :45am ? ? ?[name], CPP notified ? ?Jenniger Figiel, CCMA ?Health concierge  ?[phone]  ?

## 2021-05-14 NOTE — Telephone Encounter (Signed)
Left message on VM per DPR to set up appointments. ?

## 2021-05-19 ENCOUNTER — Telehealth: Payer: Self-pay

## 2021-05-19 NOTE — Progress Notes (Signed)
? ? ?  Chronic Care Management ?Pharmacy Assistant  ? ?Name: John Salazar  MRN: [id number] DOB: 01-Mar-1945 ? ?Reason for Encounter: CCM Counsellor) ?  ?Medications: ?Outpatient Encounter Medications as of 05/19/2021  ?Medication Sig  ? acetaminophen (TYLENOL) 325 MG tablet Take 650 mg by mouth every 6 (six) hours as needed (pain).  ? amLODipine (NORVASC) 10 MG tablet Take 5 mg by mouth daily.  ? aspirin EC 81 MG tablet Take 81 mg by mouth in the morning.  ? clopidogrel (PLAVIX) 75 MG tablet TAKE ONE TABLET BY MOUTH DAILY **NEED OFFICE VISIT**  ? escitalopram (LEXAPRO) 10 MG tablet TAKE ONE TABLET ONCE DAILY  ? famotidine (PEPCID) 20 MG tablet Take 20 mg by mouth daily as needed for heartburn or indigestion.  ? isosorbide mononitrate (IMDUR) 60 MG 24 hr tablet Take 0.5 tablets (30 mg total) by mouth daily. May take an additional one-half tablet (30 mg) by mouth as needed for chest pain  ? losartan (COZAAR) 100 MG tablet Take 100 mg by mouth daily.  ? nitroGLYCERIN (NITROSTAT) 0.4 MG SL tablet Place 1 tablet (0.4 mg total) under the tongue every 5 (five) minutes as needed for chest pain.  ? pantoprazole (PROTONIX) 40 MG tablet Take 1 tablet (40 mg total) by mouth daily.  ? rosuvastatin (CRESTOR) 40 MG tablet Take one (1) tablet by mouth ( 40 mg) daily.  ? tamsulosin (FLOMAX) 0.4 MG CAPS capsule Take 1 capsule (0.4 mg total) by mouth at bedtime.  ? Turmeric 500 MG TABS Take by mouth.  ? ?No facility-administered encounter medications on file as of 05/19/2021.  ? ?Karen Kitchens was contacted to remind of upcoming telephone visit with [name] on 05/24/2021 at 11:45. Patient was reminded to have any blood glucose and blood pressure readings available for review at appointment.  ? ?Message was left reminding patient of appointment. ? ?Star Rating Drugs: ?Medication:  Last Fill: Day Supply ?Rosuvastatin 40 mg 05/20/2021 30 ?Fill date verified with [place] ? ?[name], CPP  notified ? ?[name], RMA ?Clinical Pharmacy Assistant ?[phone] ? ? ? ? ?

## 2021-05-20 ENCOUNTER — Ambulatory Visit (INDEPENDENT_AMBULATORY_CARE_PROVIDER_SITE_OTHER): Payer: PPO | Admitting: Family Medicine

## 2021-05-20 VITALS — BP 100/70 | HR 91 | Temp 98.0°F | Ht 69.0 in | Wt 169.6 lb

## 2021-05-20 DIAGNOSIS — N1832 Chronic kidney disease, stage 3b: Secondary | ICD-10-CM

## 2021-05-20 DIAGNOSIS — Z0000 Encounter for general adult medical examination without abnormal findings: Secondary | ICD-10-CM

## 2021-05-20 DIAGNOSIS — R7303 Prediabetes: Secondary | ICD-10-CM

## 2021-05-20 DIAGNOSIS — I200 Unstable angina: Secondary | ICD-10-CM | POA: Diagnosis not present

## 2021-05-20 DIAGNOSIS — N400 Enlarged prostate without lower urinary tract symptoms: Secondary | ICD-10-CM | POA: Diagnosis not present

## 2021-05-20 DIAGNOSIS — F325 Major depressive disorder, single episode, in full remission: Secondary | ICD-10-CM

## 2021-05-20 DIAGNOSIS — I771 Stricture of artery: Secondary | ICD-10-CM | POA: Insufficient documentation

## 2021-05-20 DIAGNOSIS — I209 Angina pectoris, unspecified: Secondary | ICD-10-CM

## 2021-05-20 LAB — COMPREHENSIVE METABOLIC PANEL
ALT: 16 U/L (ref 0–53)
AST: 17 U/L (ref 0–37)
Albumin: 4.3 g/dL (ref 3.5–5.2)
Alkaline Phosphatase: 72 U/L (ref 39–117)
BUN: 19 mg/dL (ref 6–23)
CO2: 28 mEq/L (ref 19–32)
Calcium: 9.5 mg/dL (ref 8.4–10.5)
Chloride: 105 mEq/L (ref 96–112)
Creatinine, Ser: 1.66 mg/dL — ABNORMAL HIGH (ref 0.40–1.50)
GFR: 39.75 mL/min — ABNORMAL LOW (ref >60.00)
Glucose, Bld: 78 mg/dL (ref 70–99)
Potassium: 3.9 mEq/L (ref 3.5–5.1)
Sodium: 138 mEq/L (ref 135–145)
Total Bilirubin: 0.5 mg/dL (ref 0.2–1.2)
Total Protein: 6.9 g/dL (ref 6.0–8.3)

## 2021-05-20 LAB — HEMOGLOBIN A1C: Hgb A1c MFr Bld: 5.9 % (ref 4.6–6.5)

## 2021-05-20 MED ORDER — TAMSULOSIN HCL 0.4 MG PO CAPS: 0.4000 mg | ORAL_CAPSULE | Freq: Every day | ORAL | 0 refills | Status: DC

## 2021-05-20 MED ORDER — ESCITALOPRAM OXALATE 10 MG PO TABS: 10.0000 mg | ORAL_TABLET | Freq: Every day | ORAL | 1 refills | Status: DC

## 2021-05-20 NOTE — Assessment & Plan Note (Signed)
Response to as needed Imdur.  Follows with cardiology. ?

## 2021-05-20 NOTE — Assessment & Plan Note (Signed)
Stable, continue Lexapro 10 mg daily. ?

## 2021-05-20 NOTE — Assessment & Plan Note (Signed)
Repeat labs today.  Continue losartan 100 mg daily ?

## 2021-05-20 NOTE — Progress Notes (Signed)
? ?Subjective:  ? John Salazar is a 76 y.o. male who presents for Medicare Annual/Subsequent preventive examination. ? ?Review of Systems    ?Review of Systems  ?Constitutional:  Negative for chills and fever.  ?HENT:  Negative for congestion and sore throat.   ?Eyes:  Negative for blurred vision and double vision.  ?Respiratory:  Negative for shortness of breath.   ?Cardiovascular:  Negative for chest pain.  ?Gastrointestinal:  Negative for heartburn, nausea and vomiting.  ?Genitourinary: Negative.   ?Musculoskeletal: Negative.  Negative for myalgias.  ?Skin:  Negative for rash.  ?Neurological:  Negative for dizziness and headaches.  ?Endo/Heme/Allergies:  Does not bruise/bleed easily.  ?Psychiatric/Behavioral:  Negative for depression. The patient is not nervous/anxious.   ? ?Cardiac Risk Factors include: advanced age (>17mn, >>95women);hypertension;smoking/ tobacco exposure;dyslipidemia ? ?   ?Objective:  ?  ?Today's Vitals  ? 05/20/21 0817  ?BP: 100/70  ?Pulse: 91  ?Temp: 98 ?F (36.7 ?C)  ?TempSrc: Oral  ?SpO2: 97%  ?Weight: 169 lb 9 oz (76.9 kg)  ?Height: '5\' 9"'$  (1.753 m)  ? ?Body mass index is 25.04 kg/m?. ? ? ?  05/20/2021  ?  8:35 AM 06/29/2020  ?  6:57 AM 05/18/2020  ?  6:46 AM 05/04/2020  ?  6:29 PM 04/29/2020  ? 10:17 AM 04/14/2020  ?  8:13 AM 11/15/2018  ?  6:44 AM  ?Advanced Directives  ?Does Patient Have a Medical Advance Directive? No Yes Yes Yes Yes Yes Yes  ?Type of ACorporate treasurerof AH[place]Living will  Out of facility DNR (pink MOST or yellow form) Living will Living will Healthcare Power of [place] will  ?Does patient want to make changes to medical advance directive? Yes (MAU/Ambulatory/Procedural Areas - Information given) No - Patient declined    No - Patient declined   ?Copy of [place] Chart?  No - copy requested       ? ? ?Current Medications (verified) ?Outpatient Encounter Medications as of 05/20/2021  ?Medication Sig  ? acetaminophen  (TYLENOL) 325 MG tablet Take 650 mg by mouth every 6 (six) hours as needed (pain).  ? amLODipine (NORVASC) 10 MG tablet Take 5 mg by mouth daily.  ? aspirin EC 81 MG tablet Take 81 mg by mouth in the morning.  ? clopidogrel (PLAVIX) 75 MG tablet TAKE ONE TABLET BY MOUTH DAILY **NEED OFFICE VISIT**  ? famotidine (PEPCID) 20 MG tablet Take 20 mg by mouth daily as needed for heartburn or indigestion.  ? isosorbide mononitrate (IMDUR) 60 MG 24 hr tablet Take 0.5 tablets (30 mg total) by mouth daily. May take an additional one-half tablet (30 mg) by mouth as needed for chest pain  ? losartan (COZAAR) 100 MG tablet Take 100 mg by mouth daily.  ? pantoprazole (PROTONIX) 40 MG tablet Take 1 tablet (40 mg total) by mouth daily.  ? rosuvastatin (CRESTOR) 40 MG tablet Take one (1) tablet by mouth ( 40 mg) daily.  ? Turmeric 500 MG TABS Take by mouth.  ? [DISCONTINUED] escitalopram (LEXAPRO) 10 MG tablet TAKE ONE TABLET ONCE DAILY  ? [DISCONTINUED] tamsulosin (FLOMAX) 0.4 MG CAPS capsule Take 1 capsule (0.4 mg total) by mouth at bedtime.  ? escitalopram (LEXAPRO) 10 MG tablet Take 1 tablet (10 mg total) by mouth daily.  ? nitroGLYCERIN (NITROSTAT) 0.4 MG SL tablet Place 1 tablet (0.4 mg total) under the tongue every 5 (five) minutes as needed for chest pain.  ? tamsulosin (FLOMAX) 0.4  MG CAPS capsule Take 1 capsule (0.4 mg total) by mouth at bedtime.  ? ?No facility-administered encounter medications on file as of 05/20/2021.  ? ? ?Allergies (verified) ?Bee venom and Penicillins  ? ?History: ?Past Medical History:  ?Diagnosis Date  ? Anxiety   ? Barrett esophagus   ? BPH (benign prostatic hyperplasia)   ? Cerebral aneurysm   ? Chronic kidney disease   ? stage 2  ? Colon polyps   ? Coronary artery disease   ? mild-mod non-obstructive CAD  ? Erectile dysfunction   ? GERD (gastroesophageal reflux disease)   ? History of kidney stones   ? History of stomach ulcers   ? Hypercholesteremia   ? Hypertension   ? Positive TB test   ? in  the past  ? ?Past Surgical History:  ?Procedure Laterality Date  ? APPENDECTOMY  1970  ? CATARACT EXTRACTION W/ INTRAOCULAR LENS  IMPLANT, BILATERAL    ? COLONOSCOPY  10/2012  ? ESOPHAGOGASTRODUODENOSCOPY ENDOSCOPY  10/2012  ? HERNIA REPAIR    ? IR 3D INDEPENDENT WKST  04/14/2020  ? IR ANGIO INTRA EXTRACRAN SEL COM CAROTID INNOMINATE BILAT MOD SED  08/09/2017  ? IR ANGIO INTRA EXTRACRAN SEL COM CAROTID INNOMINATE BILAT MOD SED  11/08/2018  ? IR ANGIO INTRA EXTRACRAN SEL COM CAROTID INNOMINATE BILAT MOD SED  04/14/2020  ? IR ANGIO INTRA EXTRACRAN SEL COM CAROTID INNOMINATE UNI L MOD SED  06/29/2020  ? IR ANGIO INTRA EXTRACRAN SEL INTERNAL CAROTID UNI L MOD SED  05/20/2020  ? IR ANGIO VERTEBRAL SEL VERTEBRAL BILAT MOD SED  08/09/2017  ? IR ANGIO VERTEBRAL SEL VERTEBRAL BILAT MOD SED  11/08/2018  ? IR ANGIO VERTEBRAL SEL VERTEBRAL BILAT MOD SED  04/14/2020  ? IR ANGIOGRAM FOLLOW UP STUDY  05/20/2020  ? IR INTRAVSC STENT CERV CAROTID W/EMB-PROT MOD SED INCL ANGIO  08/23/2017  ? IR INTRAVSC STENT CERV CAROTID W/EMB-PROT MOD SED INCL ANGIO  06/29/2020  ? IR RADIOLOGIST EVAL & MGMT  05/05/2020  ? IR RADIOLOGIST EVAL & MGMT  06/03/2020  ? IR RADIOLOGIST EVAL & MGMT  07/15/2020  ? IR TRANSCATH/EMBOLIZ  05/18/2020  ? IR US GUIDE VASC ACCESS RIGHT  04/14/2020  ? IR US GUIDE VASC ACCESS RIGHT  05/20/2020  ? IR US GUIDE VASC ACCESS RIGHT  06/29/2020  ? LEFT HEART CATH AND CORONARY ANGIOGRAPHY N/A 11/15/2018  ? Procedure: LEFT HEART CATH AND CORONARY ANGIOGRAPHY;  Surgeon: Nelva Bush, MD;  Location: [facility];  Service: Cardiovascular;  Laterality: N/A;  ? [place]  ? 20 years ago, enlarged prostate  ? RADIOLOGY WITH ANESTHESIA N/A 08/23/2017  ? Procedure: IR WITH ANESTHESIA STENT PLACEMENT;  Surgeon: Luanne Bras, MD;  Location: [facility];  Service: Radiology;  Laterality: N/A;  ? RADIOLOGY WITH ANESTHESIA N/A 05/18/2020  ? Procedure: IR WITH ANESTHESIA EMBOLIZATION;  Surgeon: Luanne Bras, MD;  Location: [facility];  Service:  Radiology;  Laterality: N/A;  ? RADIOLOGY WITH ANESTHESIA Left 06/29/2020  ? Procedure: RADIOLOGY WITH ANESTHESIA  LEFT CAROTID STENT PLACEMENT;  Surgeon: Luanne Bras, MD;  Location: [facility];  Service: Radiology;  Laterality: Left;  ? ?Family History  ?Problem Relation Age of Onset  ? Stroke Mother 71  ? Aneurysm Father 42  ? ?Social History  ? ?Socioeconomic History  ? Marital status: Married  ?  Spouse name: pamela  ? Number of children: 2  ? Years of education: 48  ? Highest education level: Not on file  ?  Occupational History  ? Occupation: Oceanographer.  ?Tobacco Use  ? Smoking status: Former  ?  Packs/day: 2.00  ?  Years: 25.00  ?  Pack years: 50.00  ?  Types: Cigarettes  ?  Quit date: 03/10/1996  ?  Years since quitting: 25.2  ? Smokeless tobacco: Never  ?Vaping Use  ? Vaping Use: Never used  ?Substance and Sexual Activity  ? Alcohol use: No  ?  Alcohol/week: 0.0 standard drinks  ?  Comment: in the past drank, quit 1993  ? Drug use: No  ? Sexual activity: Not Currently  ?Other Topics Concern  ? Not on file  ?Social History Narrative  ? Lives with [name] (Wife) - together for 25 years  ? Children - [name] and [name] -- one is a Marine scientist in [employer] and other works for [employer]  ? Granddaughter - 18, lives in [place]  ? Enjoys - playing golf, goes to [employer] and sponsors people as well, church  ? Support - family, good friends in [place]  ? Exercise - walks the dog, runs up hills  ? Diet - improved from before, tries to drink water - low salt or no salt  ? ?Social Determinants of Health  ? ?Financial Resource Strain: Low Risk   ? Difficulty of Paying Living Expenses: Not very hard  ?Food Insecurity: No Food Insecurity  ? Worried About Charity fundraiser in the Last Year: Never true  ? Ran Out of Food in the Last Year: Never true  ?Transportation Needs: Not on file  ?Physical Activity: Not on file  ?Stress: Not on file  ?Social Connections: Not on file  ? ? ?Tobacco Counseling ?Counseling given: Not  Answered ? ? ?Clinical Intake: ? ?Pre-visit preparation completed: No ? ?Pain : No/denies pain ? ?  ? ?BMI - recorded: 25.04 ?Nutritional Status: BMI 25 -29 Overweight ?Nutritional Risks: None ?Diabetes: No ? ?How often

## 2021-05-20 NOTE — Patient Instructions (Signed)
Work on getting regular exercise 20 minutes, 7 days a week ? ?Continue healthy eating ? ?Labs today ?

## 2021-05-24 ENCOUNTER — Ambulatory Visit (INDEPENDENT_AMBULATORY_CARE_PROVIDER_SITE_OTHER): Payer: PPO | Admitting: Pharmacist

## 2021-05-24 DIAGNOSIS — N1832 Chronic kidney disease, stage 3b: Secondary | ICD-10-CM

## 2021-05-24 DIAGNOSIS — I200 Unstable angina: Secondary | ICD-10-CM

## 2021-05-24 DIAGNOSIS — F325 Major depressive disorder, single episode, in full remission: Secondary | ICD-10-CM

## 2021-05-24 DIAGNOSIS — K2270 Barrett's esophagus without dysplasia: Secondary | ICD-10-CM

## 2021-05-24 DIAGNOSIS — I10 Essential (primary) hypertension: Secondary | ICD-10-CM

## 2021-05-24 DIAGNOSIS — E782 Mixed hyperlipidemia: Secondary | ICD-10-CM

## 2021-05-24 NOTE — Progress Notes (Signed)
? ?Chronic Care Management ?Pharmacy Note ? ?06/03/2021 ?Name:  John Salazar MRN:  [id number] DOB:  November 06, 1945 ? ?Summary: CCM F/U visit ?-BP is at goal at home on average; range 102/72-126/74 ?-LDL improved to 55 (04/2021). Discussed goal is at least < 70 so no additional medication is needed. ? ?Recommendations/Changes made from today's visit: ?-No med changes ? ?Plan: ?-[place] will call patient 3 months for BP update ?-Pharmacist follow up televisit scheduled for 1 year ? ? ?Subjective: ?John Salazar is an 76 y.o. year old male who is a primary patient of Cody, Jobe Marker, MD.  The CCM team was consulted for assistance with disease management and care coordination needs.   ? ?Engaged with patient by telephone for follow up visit in response to provider referral for pharmacy case management and/or care coordination services.  ? ?Consent to Services:  ?The patient was given information about Chronic Care Management services, agreed to services, and gave verbal consent prior to initiation of services.  Please see initial visit note for detailed documentation.  ? ?Patient Care Team: ?Lesleigh Noe, MD as PCP - General (Family Medicine) ?Fay Records, MD as PCP - Cardiology (Cardiology) ?Meredith Pel, MD as Consulting Physician (Orthopedic Surgery) ?Luanne Bras, MD as Consulting Physician (Interventional Radiology) ?Clarene Essex, MD as Consulting Physician (Gastroenterology) ?Nailah Luepke, Cleaster Corin, [facility] as Pharmacist (Pharmacist) ? ?Recent office visits: ?05/20/21 Dr Einar Pheasant OV: AWV -  BMP, A1c stable. No med changes. ? ?01/14/2021 - [name], NP - Patient Message - Patient tested positive for Covid. Start: Physicist, medical. ?01/13/2021 - [name], NP - Patient presented for Covid exposure and cough. Patient was advised to test for Covid. ?11/23/2020 - [name], MD - Patient presented for abdominal pain and diarrhea. Labs: CBC, CMP, Celiac, TSH and Uric Acid. Start:  esomeprazole (NEXIUM) 20 MG capsule. Stop: pantoprazole (PROTONIX) 40 MG tablet. Immunizations: [employer] (high dose 65+). ?11/17/2020 - [name], MD - Patient Message - Patient presented for right heel pain. Recommended: Voltaren gel or Ibuprofen/Tylenol.  ?11/11/2020 - [name], MD - Patient Message - Referral to Gastroenterology.  ? ?Recent consult visits: ?04/20/21 PA Nicholes Rough (Cardiology): f/u CAD. LDL 55. No med changes. Refilled rosuvastatin and isosorbide. Ordered ECHO to evaluate occasional chest pain. ? ?02/17/2021 - [name], DPM - Podiatry - Patient presented for achilles tendinitis of left foot. Administered: Kenalog injection. ?01/05/2021 - [name] - Dermatology - Patient presented for basal cell carcinoma of nose.  ?12/18/2020 - [name], MD - Gastroenterology - Patient presented for gas and bloating. Ordered: H. Pylori.  ?12/16/2020 - [name], DPM - Podiatry - Patient presented for achilles tendinitis of right foot. Administered: Kenalog injection. ?11/20/2020 - [name], DPM - Podiatry - Patient presented for foot pain. Orders: DG Foot 2 view right. Administered: Kenalog injection. ?10/27/2020 - [name], Specialist - Patient presented for follow up exam after completed treatment of malignant neoplasm. Procedure: Skin biopsy single lesion.  ?10/27/2020 - [name] - Pathology - Patient presented for basal cell carcinoma of skin of nose. Procedure: CHG Surgical; path. Level IV.  ?10/15/2020 - [name] - Patient presented for sensorineural hearing loss, bilateral. Procedure: assessment for hearing aid.  ?10/13/2020 - [name], RPH-CPP - Cardiology - Telephone - patient self adjusted meds: losartan 1/2 tab and amlodipine 1/2 tab. BP at goal. ? ?Hospital visits: ?None in previous 6 months ? ? ?Objective: ? ?Lab Results  ?Component Value Date  ? CREATININE  1.66 (H) 05/20/2021  ? BUN 19 05/20/2021  ? GFR 39.75 (L) 05/20/2021  ? GFRNONAA 48 (L)  06/30/2020  ? GFRAA 52 (L) 01/13/2020  ? NA 138 05/20/2021  ? K 3.9 05/20/2021  ? CALCIUM 9.5 05/20/2021  ? CO2 28 05/20/2021  ? GLUCOSE 78 05/20/2021  ? ? ?Lab Results  ?Component Value Date/Time  ? HGBA1C 5.9 05/20/2021 08:59 AM  ? HGBA1C 5.8 04/09/2018 09:59 AM  ? GFR 39.75 (L) 05/20/2021 08:59 AM  ? GFR 39.32 (L) 11/23/2020 09:06 AM  ?  ?Last diabetic Eye exam: No results found for: HMDIABEYEEXA  ?Last diabetic Foot exam: No results found for: HMDIABFOOTEX  ? ?Lab Results  ?Component Value Date  ? CHOL 124 04/19/2021  ? HDL 48 04/19/2021  ? [place] Path 55 04/19/2021  ? TRIG 119 04/19/2021  ? CHOLHDL 2.6 04/19/2021  ? ? ? ?  Latest Ref Rng & Units 05/20/2021  ?  8:59 AM 04/19/2021  ?  7:20 AM 11/23/2020  ?  9:06 AM  ?Hepatic Function  ?Total Protein 6.0 - 8.3 g/dL 6.9    6.9    ?Albumin 3.5 - 5.2 g/dL 4.3    4.4    ?AST 0 - 37 U/L 17    20    ?ALT 0 - 53 U/L _0 ?Alk Phosphatase 39 - 117 U/L 72    74    ?Total Bilirubin 0.2 - 1.2 mg/dL 0.5    0.5    ? ? ?Lab Results  ?Component Value Date/Time  ? TSH 2.43 11/23/2020 09:06 AM  ? TSH 1.11 02/11/2019 02:26 PM  ? ? ? ?  Latest Ref Rng & Units 11/23/2020  ?  9:06 AM 06/30/2020  ?  5:41 AM 06/26/2020  ? 10:05 AM  ?CBC  ?WBC 4.0 - 10.5 K/uL 4.9   7.4   4.9    ?Hemoglobin 13.0 - 17.0 g/dL 13.1   11.0   13.5    ?Hematocrit 39.0 - 52.0 % 38.9   32.2   40.8    ?Platelets 150.0 - 400.0 K/uL 174.0   143   165    ? ? ?Lab Results  ?Component Value Date/Time  ? VD25OH 31.70 02/11/2019 02:26 PM  ? ? ?Clinical ASCVD: Yes  - angina ?The ASCVD Risk score (Arnett DK, et al., 2019) failed to calculate for the following reasons: ?  The valid total cholesterol range is 130 to 320 mg/dL   ? ? ?  05/20/2021  ?  9:24 AM 04/29/2020  ? 10:40 AM 04/29/2020  ? 10:19 AM  ?Depression screen PHQ 2/9  ?Decreased Interest 1 0 0  ?Down, Depressed, Hopeless 0 0 0  ?PHQ - 2 Score 1 0 0  ?Altered sleeping 0    ?Tired, decreased energy 2    ?Change in appetite 2    ?Feeling bad or failure about  yourself  0    ?Trouble concentrating 0    ?Moving slowly or fidgety/restless 0    ?Suicidal thoughts 0    ?PHQ-9 Score 5    ?Difficult doing work/chores Not difficult at all    ?  ? ?Social History  ? ?Tobacco Use  ?Smoking Status Former  ? Packs/day: 2.00  ? Years: 25.00  ? Pack years: 50.00  ? Types: Cigarettes  ? Quit date: 03/10/1996  ? Years since quitting: 25.2  ?Smokeless Tobacco Never  ? ?BP Readings from Last 3 Encounters:  ?05/20/21 100/70  ?  04/20/21 120/76  ?01/13/21 129/67  ? ?Pulse Readings from Last 3 Encounters:  ?05/20/21 91  ?04/20/21 72  ?01/13/21 88  ? ?Wt Readings from Last 3 Encounters:  ?05/20/21 169 lb 9 oz (76.9 kg)  ?04/20/21 171 lb 6.4 oz (77.7 kg)  ?12/18/20 170 lb (77.1 kg)  ? ?BMI Readings from Last 3 Encounters:  ?05/20/21 25.04 kg/m?  ?04/20/21 25.31 kg/m?  ?12/18/20 25.10 kg/m?  ? ? ?Assessment/Interventions: Review of patient past medical history, allergies, medications, health status, including review of consultants reports, laboratory and other test data, was performed as part of comprehensive evaluation and provision of chronic care management services.  ? ?SDOH:  (Social Determinants of Health) assessments and interventions performed: Yes ? ? ?SDOH Screenings  ? ?Alcohol Screen: Not on file  ?Depression (PHQ2-9): Medium Risk  ? PHQ-2 Score: 5  ?Financial Resource Strain: Low Risk   ? Difficulty of Paying Living Expenses: Not very hard  ?Food Insecurity: No Food Insecurity  ? Worried About Charity fundraiser in the Last Year: Never true  ? Ran Out of Food in the Last Year: Never true  ?Housing: Not on file  ?Physical Activity: Not on file  ?Social Connections: Not on file  ?Stress: Not on file  ?Tobacco Use: Medium Risk  ? Smoking Tobacco Use: Former  ? Smokeless Tobacco Use: Never  ? Passive Exposure: Not on file  ?Transportation Needs: Not on file  ? ? ?[place] ? ?Allergies  ?Allergen Reactions  ? Bee Venom Anaphylaxis and Shortness Of Breath  ? Penicillins Rash  ?   PATIENT HAS HAD A PCN REACTION WITH IMMEDIATE RASH, FACIAL/TONGUE/THROAT SWELLING, SOB, OR LIGHTHEADEDNESS WITH HYPOTENSION:  #  #  YES  #  #  ?Has patient had a PCN reaction causing severe rash involving mucus membranes or skin

## 2021-06-03 NOTE — Patient Instructions (Signed)
Visit Information ? ?Phone number for Pharmacist: [phone] ? ? Goals Addressed   ?None ?  ? ? ?Care Plan : [place]  ?Updates made by Charlton Haws, [place] since 06/03/2021 12:00 AM  ?  ? ?Problem: Hypertension, Hyperlipidemia, Coronary Artery Disease, GERD, Chronic Kidney Disease, Depression, and Anxiety   ?Priority: High  ?  ? ?Long-Range Goal: Disease mgmt   ?Start Date: 03/22/2021  ?Expected End Date: 06/04/2022  ?This Visit's Progress: On track  ?Recent Progress: On track  ?Priority: High  ?Note:   ?Current Barriers:  ?None identified ? ?Pharmacist Clinical Goal(s):  ?Patient will contact provider office for questions/concerns as evidenced notation of same in electronic health record through collaboration with PharmD and provider.  ? ?Interventions: ?1:1 collaboration with Lesleigh Noe, MD regarding development and update of comprehensive plan of care as evidenced by provider attestation and co-signature ?Inter-disciplinary care team collaboration (see longitudinal plan of care) ?Comprehensive medication review performed; medication list updated in electronic medical record ? ?Hypertension / CKD stage 3b (BP goal <130/80) ?-Controlled- pt has hx of labile BP, lately he has been doing well on current regimen ?-Current home readings:  ?4/17 11am 114/67 ?4/16 11am 102/72 ?4/15 11am 126/74 ?-Current treatment: ?Amlodipine 5 mg daily HS - Appropriate, Effective, Safe, Accessible ?Losartan 100 mg daily HS - Appropriate, Effective, Safe, Accessible ?Isosorbide MN 60 mg -1/2 tab daily - Appropriate, Effective, Safe, Accessible ?-Medications previously tried: n/a  ?-Educated on BP goals and benefits of medications for prevention of heart attack, stroke and kidney damage; Symptoms of hypotension and importance of maintaining adequate hydration; ?-Counseled to monitor BP at home daily ?-Recommend to continue current medication ? ?Hyperlipidemia / CAD: (LDL goal < 70) ?-Controlled - LDL 55 (04/2021) at  goal; pt is compliant with medications; pt has switched PPI to pantoprazole to avoid DDI with clopidogrel ?-Hx angina; hx brain aneurysm w/ stent 06/2020; managed per Dr Harrington Challenger ?-Current treatment: ?Rosuvastatin 40 mg daily HS - Appropriate, Effective, Safe, Accessible ?Isosorbide MN 60 mg - 1/2 tab daily - Appropriate, Effective, Safe, Accessible ?Clopidogrel 75 mg daily - Appropriate, Effective, Safe, Accessible ?Nitroglycerin 0.4 mg SL prn - Appropriate, Effective, Safe, Accessible ?Aspirin 81 mg daily -Appropriate, Effective, Safe, Accessible ?-Current dietary patterns: tries to eat healthy; enjoys fried chicken, desserts occasionally ?-Current exercise habits: limited due to knee/foot pain; was doing water aerobics before pandemic ?-Recommend to continue current medication ? ?Depression/Anxiety (Goal: manage symptoms) ?-Controlled - pt thinks he does "pretty well" most of the time; he reports he has never been able to tell how much Lexapro is helping; he reports low motivation to do things, whereas most of his life he has been a "self-motivated individual" ?-Pt is recovering alcoholic, sober for 29 years; he reports social life is fairly active  ?-PHQ9: 0 (04/2020) - minimal depression ?-GAD7: 4 (01/2021) - minimal anxiety ?-Connected with PCP for mental health support ?-Current treatment: ?Escitalopram 10 mg daily -Appropriate, Effective, Safe, Accessible ?-Medications previously tried/failed: hydroxyzine ?-Educated on Benefits of medication for symptom control; discussed abruptly stopping medication can lead to worsening depression ?-Recommended to continue current medication ? ?GERD (Goal: manage symptoms) ?-Controlled - pt switched from esomeprazole to pantoprazole last month (due to DDI with clopidogrel) and reports symptoms are controlled ?-Current treatment  ?Pantoprazole 40 mg daily 12p - Appropriate, Effective, Safe, Accessible ?Famotidine 20 mg PRN - Appropriate, Effective, Safe, Accessible ?-Recommend to  continue current medication ? ?Patient Goals/Self-Care Activities ?Patient will:  ?- take medications as prescribed  as evidenced by patient report and record review ?focus on medication adherence by routine ?check blood pressure daily, document, and provide at future appointments ?  ?  ? ?Patient verbalizes understanding of instructions and care plan provided today and agrees to view in [place]. Active MyChart status confirmed with patient.   ?Telephone follow up appointment with pharmacy team member scheduled for: 1 year ? ?[name], PharmD, BCACP ?Clinical Pharmacist ?[facility] ?[phone] ?  ?

## 2021-06-06 DIAGNOSIS — F32A Depression, unspecified: Secondary | ICD-10-CM | POA: Diagnosis not present

## 2021-06-06 DIAGNOSIS — N1832 Chronic kidney disease, stage 3b: Secondary | ICD-10-CM

## 2021-06-06 DIAGNOSIS — I129 Hypertensive chronic kidney disease with stage 1 through stage 4 chronic kidney disease, or unspecified chronic kidney disease: Secondary | ICD-10-CM | POA: Diagnosis not present

## 2021-06-06 DIAGNOSIS — I2510 Atherosclerotic heart disease of native coronary artery without angina pectoris: Secondary | ICD-10-CM | POA: Diagnosis not present

## 2021-06-06 DIAGNOSIS — E785 Hyperlipidemia, unspecified: Secondary | ICD-10-CM

## 2021-06-17 DIAGNOSIS — H903 Sensorineural hearing loss, bilateral: Secondary | ICD-10-CM | POA: Diagnosis not present

## 2021-06-30 ENCOUNTER — Other Ambulatory Visit: Payer: Self-pay | Admitting: Physician Assistant

## 2021-06-30 NOTE — Telephone Encounter (Signed)
Refill for Plavix 75 mg PO QD #30, refill 3 faxed to [facility] [address], [place], [place],  [address] [phone]).  Candiss Norse, PA-C

## 2021-07-05 ENCOUNTER — Encounter: Payer: Self-pay | Admitting: Family Medicine

## 2021-07-08 DIAGNOSIS — H903 Sensorineural hearing loss, bilateral: Secondary | ICD-10-CM | POA: Diagnosis not present

## 2021-08-02 ENCOUNTER — Encounter: Payer: Self-pay | Admitting: Family Medicine

## 2021-08-03 ENCOUNTER — Other Ambulatory Visit (HOSPITAL_COMMUNITY): Payer: Self-pay | Admitting: Interventional Radiology

## 2021-08-03 DIAGNOSIS — I771 Stricture of artery: Secondary | ICD-10-CM

## 2021-08-05 ENCOUNTER — Ambulatory Visit (INDEPENDENT_AMBULATORY_CARE_PROVIDER_SITE_OTHER): Payer: PPO | Admitting: Family Medicine

## 2021-08-05 ENCOUNTER — Ambulatory Visit (INDEPENDENT_AMBULATORY_CARE_PROVIDER_SITE_OTHER)
Admission: RE | Admit: 2021-08-05 | Discharge: 2021-08-05 | Disposition: A | Discharge status: Home / Self Care | Payer: PPO | Source: Ambulatory Visit | Attending: Family Medicine | Admitting: Family Medicine

## 2021-08-05 VITALS — BP 110/60 | HR 67 | Temp 97.6°F | Wt 172.2 lb

## 2021-08-05 DIAGNOSIS — R5383 Other fatigue: Secondary | ICD-10-CM

## 2021-08-05 DIAGNOSIS — I10 Essential (primary) hypertension: Secondary | ICD-10-CM | POA: Diagnosis not present

## 2021-08-05 DIAGNOSIS — I4510 Unspecified right bundle-branch block: Secondary | ICD-10-CM

## 2021-08-05 DIAGNOSIS — R0609 Other forms of dyspnea: Secondary | ICD-10-CM | POA: Insufficient documentation

## 2021-08-05 DIAGNOSIS — R29898 Other symptoms and signs involving the musculoskeletal system: Secondary | ICD-10-CM | POA: Diagnosis not present

## 2021-08-05 DIAGNOSIS — I2510 Atherosclerotic heart disease of native coronary artery without angina pectoris: Secondary | ICD-10-CM

## 2021-08-05 DIAGNOSIS — R0600 Dyspnea, unspecified: Secondary | ICD-10-CM | POA: Diagnosis not present

## 2021-08-05 LAB — CBC WITH DIFFERENTIAL/PLATELET
Basophils Absolute: 0 10*3/uL (ref 0.0–0.1)
Basophils Relative: 0.3 % (ref 0.0–3.0)
Eosinophils Absolute: 0.2 10*3/uL (ref 0.0–0.7)
Eosinophils Relative: 3.7 % (ref 0.0–5.0)
HCT: 41 % (ref 39.0–52.0)
Hemoglobin: 13.6 g/dL (ref 13.0–17.0)
Lymphocytes Relative: 24.9 % (ref 12.0–46.0)
Lymphs Abs: 1 10*3/uL (ref 0.7–4.0)
MCHC: 33 g/dL (ref 30.0–36.0)
MCV: 90.5 fl (ref 78.0–100.0)
Monocytes Absolute: 0.6 10*3/uL (ref 0.1–1.0)
Monocytes Relative: 13.8 % — ABNORMAL HIGH (ref 3.0–12.0)
Neutro Abs: 2.4 10*3/uL (ref 1.4–7.7)
Neutrophils Relative %: 57.3 % (ref 43.0–77.0)
Platelets: 161 10*3/uL (ref 150.0–400.0)
RBC: 4.53 Mil/uL (ref 4.22–5.81)
RDW: 13.8 % (ref 11.5–15.5)
WBC: 4.2 10*3/uL (ref 4.0–10.5)

## 2021-08-05 LAB — COMPREHENSIVE METABOLIC PANEL
ALT: 15 U/L (ref 0–53)
AST: 18 U/L (ref 0–37)
Albumin: 4.4 g/dL (ref 3.5–5.2)
Alkaline Phosphatase: 87 U/L (ref 39–117)
BUN: 22 mg/dL (ref 6–23)
CO2: 29 mEq/L (ref 19–32)
Calcium: 9.6 mg/dL (ref 8.4–10.5)
Chloride: 105 mEq/L (ref 96–112)
Creatinine, Ser: 1.65 mg/dL — ABNORMAL HIGH (ref 0.40–1.50)
GFR: 39.98 mL/min — ABNORMAL LOW (ref >60.00)
Glucose, Bld: 99 mg/dL (ref 70–99)
Potassium: 4.8 mEq/L (ref 3.5–5.1)
Sodium: 138 mEq/L (ref 135–145)
Total Bilirubin: 0.6 mg/dL (ref 0.2–1.2)
Total Protein: 6.9 g/dL (ref 6.0–8.3)

## 2021-08-05 LAB — FERRITIN: Ferritin: 31.6 ng/mL (ref 22.0–322.0)

## 2021-08-05 LAB — TSH: TSH: 1.86 u[IU]/mL (ref 0.35–5.50)

## 2021-08-05 LAB — VITAMIN B12: Vitamin B-12: 165 pg/mL — ABNORMAL LOW (ref 211–911)

## 2021-08-05 LAB — VITAMIN D 25 HYDROXY (VIT D DEFICIENCY, FRACTURES): VITD: 38.23 ng/mL (ref 30.00–100.00)

## 2021-08-05 NOTE — Progress Notes (Signed)
Subjective:     John Salazar is a 76 y.o. male presenting for Extremity Weakness (Legs, upon short distances ), Fatigue, and Shortness of Breath     HPI  #Leg weakness - happened before when he was seeing cardiology - now getting tired just picking up the trash bags - similar symptoms around the time he had a heart cath - had stents and brain and head - had an ECHO 04/2021 - no cp with symptoms - no leg swelling - having weakness in the legs as well as DOE - has to rest when he gets back - sometimes has a lot of energy - may just be the eat - water: 2-3 small bottles per day, then ice tea and coffee  No tingling or numbness in the legs  Review of Systems   Social History   Tobacco Use  Smoking Status Former   Packs/day: 2.00   Years: 25.00   Total pack years: 50.00   Types: Cigarettes   Quit date: 03/10/1996   Years since quitting: 25.4  Smokeless Tobacco Never        Objective:    BP Readings from Last 3 Encounters:  08/05/21 110/60  05/20/21 100/70  04/20/21 120/76   Wt Readings from Last 3 Encounters:  08/05/21 172 lb 4 oz (78.1 kg)  05/20/21 169 lb 9 oz (76.9 kg)  04/20/21 171 lb 6.4 oz (77.7 kg)    BP 110/60   Pulse 67   Temp 97.6 F (36.4 C) (Temporal)   Wt 172 lb 4 oz (78.1 kg)   SpO2 99%   BMI 25.44 kg/m    Physical Exam Constitutional:      Appearance: Normal appearance. He is not ill-appearing or diaphoretic.  HENT:     Head: Normocephalic and atraumatic.     Right Ear: External ear normal.     Left Ear: External ear normal.     Nose: Nose normal.  Eyes:     General: No scleral icterus.    Extraocular Movements: Extraocular movements intact.     Conjunctiva/sclera: Conjunctivae normal.  Cardiovascular:     Rate and Rhythm: Normal rate and regular rhythm.     Heart sounds: No murmur heard. Pulmonary:     Effort: Pulmonary effort is normal.     Breath sounds: Normal breath sounds. No decreased breath sounds, wheezing or  rhonchi.  Musculoskeletal:     Cervical back: Neck supple.     Right lower leg: No tenderness. No edema.     Left lower leg: No tenderness. No edema.  Skin:    General: Skin is warm and dry.  Neurological:     Mental Status: He is alert. Mental status is at baseline.  Psychiatric:        Mood and Affect: Mood normal.     EKG: NSR, no st changes. Incomplete RBBB.  CXR (my read): no active cardio/pulmonary disease      Assessment & Plan:   Problem List Items Addressed This Visit       Cardiovascular and Mediastinum   Benign essential HTN    Blood pressure controlled.  Continue amlodipine '10mg'$ , losartan '100mg'$ .  Advised restarting 1/2 pill of Imdur to see if that helps with his fatigue and shortness of breath.      Relevant Orders   Comprehensive metabolic panel   CBC with Differential   Ferritin   Incomplete RBBB    EKG with slight change, advise cardiology follow-up.  We will get  rest of labs if anything else points to cause for fatigue will address that, if labs are reassuring then will recommend closer follow-up with cardiology.  Restart 1/2 pill of Imdur.        Other   DOE (dyspnea on exertion) - Primary    Highly concerning for cardiac origin. CXR reassuring. EKG with incomplete RBBB. Will f/u labs. Restart imdur. Pending labs - f/u with cardiology.       Relevant Orders   EKG 12-Lead (Completed)   TSH   Ferritin   DG Chest 2 View   Other Visit Diagnoses     CAD in native artery       Relevant Orders   EKG 12-Lead (Completed)   Leg weakness, bilateral       Relevant Orders   Vitamin B12   Other fatigue       Relevant Orders   Vitamin D, 25-hydroxy        Return if symptoms worsen or fail to improve.  Lesleigh Noe, MD

## 2021-08-05 NOTE — Assessment & Plan Note (Signed)
Blood pressure controlled.  Continue amlodipine '10mg'$ , losartan '100mg'$ .  Advised restarting 1/2 pill of Imdur to see if that helps with his fatigue and shortness of breath.

## 2021-08-05 NOTE — Assessment & Plan Note (Signed)
EKG with slight change, advise cardiology follow-up.  We will get rest of labs if anything else points to cause for fatigue will address that, if labs are reassuring then will recommend closer follow-up with cardiology.  Restart 1/2 pill of Imdur.

## 2021-08-05 NOTE — Assessment & Plan Note (Signed)
Highly concerning for cardiac origin. CXR reassuring. EKG with incomplete RBBB. Will f/u labs. Restart imdur. Pending labs - f/u with cardiology.

## 2021-08-05 NOTE — Patient Instructions (Signed)
Some possible changes to your EKG - Call cardiology for follow-up  - labs and X-ray today

## 2021-08-06 ENCOUNTER — Telehealth: Payer: Self-pay

## 2021-08-06 NOTE — Telephone Encounter (Signed)
Patient called in stating he has seen his lab result note. John Salazar is okay with doing the B12, insurance does require a PA.

## 2021-08-06 NOTE — Telephone Encounter (Signed)
Please start once per month Vit B12 injections x 3 months

## 2021-08-09 NOTE — Telephone Encounter (Signed)
Called pt and he said he would call back once checked his schedule.

## 2021-08-11 ENCOUNTER — Telehealth: Payer: Self-pay

## 2021-08-11 NOTE — Progress Notes (Signed)
Chronic Care Management Pharmacy Assistant   Name: John Salazar  MRN: [id number] DOB: 10-26-1945  Reason for Encounter: CCM (Hypertension Disease State)  Recent office visits:  08/05/21 [name], MD DOE Abnormal Labs: "Your Vit B12 is low.  This can be replaced with an oral supplement 1000 mcg daily or we could do B12 injections.  Injections can be done once a month at the office" Restart: 0.5 tablet Imdur for fatigue and shortness of breath.   Recent consult visits:  None since last CCM contact  Hospital visits:  None since last CCM contact  Medications: Outpatient Encounter Medications as of 08/11/2021  Medication Sig   acetaminophen (TYLENOL) 325 MG tablet Take 650 mg by mouth every 6 (six) hours as needed (pain).   amLODipine (NORVASC) 10 MG tablet Take 5 mg by mouth daily.   aspirin EC 81 MG tablet Take 81 mg by mouth in the morning.   clopidogrel (PLAVIX) 75 MG tablet TAKE ONE TABLET BY MOUTH DAILY **NEED OFFICE VISIT**   escitalopram (LEXAPRO) 10 MG tablet Take 1 tablet (10 mg total) by mouth daily.   famotidine (PEPCID) 20 MG tablet Take 20 mg by mouth daily as needed for heartburn or indigestion.   isosorbide mononitrate (IMDUR) 60 MG 24 hr tablet Take 0.5 tablets (30 mg total) by mouth daily. May take an additional one-half tablet (30 mg) by mouth as needed for chest pain   losartan (COZAAR) 100 MG tablet Take 100 mg by mouth daily.   nitroGLYCERIN (NITROSTAT) 0.4 MG SL tablet Place 1 tablet (0.4 mg total) under the tongue every 5 (five) minutes as needed for chest pain.   pantoprazole (PROTONIX) 40 MG tablet Take 1 tablet (40 mg total) by mouth daily.   rosuvastatin (CRESTOR) 40 MG tablet Take one (1) tablet by mouth ( 40 mg) daily.   tamsulosin (FLOMAX) 0.4 MG CAPS capsule Take 1 capsule (0.4 mg total) by mouth at bedtime.   Turmeric 500 MG TABS Take by mouth.   No facility-administered encounter medications on file as of 08/11/2021.    Recent Office Vitals: BP  Readings from Last 3 Encounters:  08/05/21 110/60  05/20/21 100/70  04/20/21 120/76   Pulse Readings from Last 3 Encounters:  08/05/21 67  05/20/21 91  04/20/21 72    Wt Readings from Last 3 Encounters:  08/05/21 172 lb 4 oz (78.1 kg)  05/20/21 169 lb 9 oz (76.9 kg)  04/20/21 171 lb 6.4 oz (77.7 kg)     Kidney Function Lab Results  Component Value Date/Time   CREATININE 1.65 (H) 08/05/2021 09:26 AM   CREATININE 1.66 (H) 05/20/2021 08:59 AM   GFR 39.98 (L) 08/05/2021 09:26 AM   GFRNONAA 48 (L) 06/30/2020 05:41 AM   GFRAA 52 (L) 01/13/2020 11:38 AM       Latest Ref Rng & Units 08/05/2021    9:26 AM 05/20/2021    8:59 AM 11/23/2020    9:06 AM  BMP  Glucose 70 - 99 mg/dL 99  78  60   BUN 6 - 23 mg/dL '22  19  22   '$ Creatinine 0.40 - 1.50 mg/dL 1.65  1.66  1.68   Sodium 135 - 145 mEq/L 138  138  139   Potassium 3.5 - 5.1 mEq/L 4.8  3.9  4.0   Chloride 96 - 112 mEq/L 105  105  104   CO2 19 - 32 mEq/L '29  28  28   '$ Calcium 8.4 - 10.5 mg/dL  9.6  9.5  9.6     Contacted patient on 08/13/2021 to discuss hypertension disease state  Current antihypertensive regimen:  Amlodipine 5 mg daily HS  Losartan 100 mg daily HS  Isosorbide MN 60 mg -1/2 tab daily  Patient verbally confirms he is taking the above medications as directed. Yes  How often are you checking your Blood Pressure? infrequently  he checks his blood pressure in the morning after taking his medication.  Current home BP readings: I have asked patient to take their blood pressure daily and keep a log. Advised patient I would call back on 08/20/21 for log. Patient verbalized understanding and agreed.   Wrist or arm cuff: Arm cuff Caffeine intake: Coffee - 2 cups Tea - 2 cups 5 bottles of water Salt intake: Patients watches his salt intake Over the counter medications including pseudoephedrine or NSAIDs? Tylenol 2 occasionally  Any readings above 180/120? No  What recent interventions/DTPs have been made by any  provider to improve Blood Pressure control since last CPP Visit:  Continue current medication  Any recent hospitalizations or ED visits since last visit with CPP? No  What diet changes have been made to improve Blood Pressure Control?  Patient does not eat fatty foods and does not add salt  What exercise is being done to improve your Blood Pressure Control?  Patient works in the yard for exercise  Adherence Review: Is the patient currently on ACE/ARB medication? No Does the patient have >5 day gap between last estimated fill dates? No  Star Rating Drugs:  Medication:  Last Fill: Day Supply Rosuvastatin 40 mg 07/10/21 90  Care Gaps: Annual wellness visit in last year? Yes 05/20/21 Most Recent BP reading: 110/60 on 08/05/21  Upcoming appointments: Cardiology appointment on 08/23/21 CCM appointment on 05/23/21  [name], CPP notified  [name], [place] Assistant [phone]

## 2021-08-12 ENCOUNTER — Ambulatory Visit (INDEPENDENT_AMBULATORY_CARE_PROVIDER_SITE_OTHER): Payer: PPO

## 2021-08-12 DIAGNOSIS — E538 Deficiency of other specified B group vitamins: Secondary | ICD-10-CM

## 2021-08-12 MED ORDER — CYANOCOBALAMIN 1000 MCG/ML IJ SOLN
1000.0000 ug | Freq: Once | INTRAMUSCULAR | Status: AC
  Administered 2021-08-12: 1000 ug via INTRAMUSCULAR

## 2021-08-12 NOTE — Progress Notes (Signed)
Per orders of Katherine Clark, NP, injection of vit B12 given by Cara Aguino. Patient tolerated injection well.  

## 2021-08-16 ENCOUNTER — Ambulatory Visit (HOSPITAL_COMMUNITY)
Admission: RE | Admit: 2021-08-16 | Discharge: 2021-08-16 | Disposition: A | Discharge status: Home / Self Care | Payer: PPO | Source: Ambulatory Visit | Attending: Interventional Radiology | Admitting: Interventional Radiology

## 2021-08-16 DIAGNOSIS — I6623 Occlusion and stenosis of bilateral posterior cerebral arteries: Secondary | ICD-10-CM | POA: Diagnosis not present

## 2021-08-16 DIAGNOSIS — I672 Cerebral atherosclerosis: Secondary | ICD-10-CM | POA: Diagnosis not present

## 2021-08-16 DIAGNOSIS — I6523 Occlusion and stenosis of bilateral carotid arteries: Secondary | ICD-10-CM | POA: Diagnosis not present

## 2021-08-16 DIAGNOSIS — I6601 Occlusion and stenosis of right middle cerebral artery: Secondary | ICD-10-CM | POA: Diagnosis not present

## 2021-08-16 DIAGNOSIS — I771 Stricture of artery: Secondary | ICD-10-CM | POA: Insufficient documentation

## 2021-08-16 MED ORDER — IOHEXOL 350 MG/ML SOLN
75.0000 mL | Freq: Once | INTRAVENOUS | Status: AC | PRN
  Administered 2021-08-16: 75 mL via INTRAVENOUS

## 2021-08-19 DIAGNOSIS — H35033 Hypertensive retinopathy, bilateral: Secondary | ICD-10-CM | POA: Diagnosis not present

## 2021-08-20 ENCOUNTER — Telehealth: Payer: Self-pay | Admitting: Internal Medicine

## 2021-08-20 NOTE — Progress Notes (Signed)
Imaging reviewed with Dr. Estanislado Pandy who recommends 6 month follow-up CTA head/neck. Schedulers were notified to call patient to schedule follow-up scan.    Narda Rutherford, AGNP-BC 08/20/2021, 2:51 PM

## 2021-08-20 NOTE — Progress Notes (Signed)
Called patient for blood pressure log as previously discussed.   Date Blood Pressure 07/14 112/73 07/13 134/76 07/12 136/84 07/11 127/75  116/64 07/10 106/66 07/09 147/91 07/08 121/69 07/07 -------- 07/06 127/72  [name], CPP notified  [name], [place] Clinical Pharmacy Assistant [phone]

## 2021-08-22 ENCOUNTER — Encounter: Payer: Self-pay | Admitting: Physician Assistant

## 2021-08-22 DIAGNOSIS — I77810 Thoracic aortic ectasia: Secondary | ICD-10-CM | POA: Insufficient documentation

## 2021-08-22 DIAGNOSIS — I779 Disorder of arteries and arterioles, unspecified: Secondary | ICD-10-CM | POA: Insufficient documentation

## 2021-08-22 DIAGNOSIS — I25119 Atherosclerotic heart disease of native coronary artery with unspecified angina pectoris: Secondary | ICD-10-CM | POA: Insufficient documentation

## 2021-08-22 HISTORY — DX: Thoracic aortic ectasia: I77.810

## 2021-08-22 NOTE — Progress Notes (Unsigned)
Cardiology Office Note:    Date:  08/23/2021   ID:  John Salazar, DOB 10/05/1945, MRN [id number]  PCP:  John Noe, MD  [place] Providers Cardiologist:  John Carnes, MD    Referring MD: John Noe, MD   Chief Complaint:  Fatigue and Shortness of Breath    Patient Profile: Coronary artery disease  Cath 11/2018: dist and small vessel disease in the dLCx/OM1 - Med Rx  Hypertension  Chronic kidney disease  Hyperlipidemia  Carotid artery stenosis S/p L ICA stent 06/2020 (Dr. Estanislado Pandy) S/p R ICA stent in 08/2017 Supraclinoid L ICA aneurysm  S/p embolization 05/2020 (Dr. Estanislado Pandy) GERD  ETOH abuse  Dilated ascending aorta  Echocardiogram 04/2021: 40 mm  Prior CV Studies: ECHOCARDIOGRAM 05/06/21 EF 60-65, no RWMA, normal RVSF, normal PASP (RVSP 26), mild dilation of ascending aorta (40 mm)  CARDIAC CATHETERIZATION 11/15/2018 LM dist 20 LAD ost 35, prox 2 LCx ost 63, mid 33; Lat OM1 80; OM2 70 RCA prox 40 Conclusions: Severe single-vessel coronary artery disease involving a branch of OM1 (80% ostial stenosis) and distal LCx/OM2 (70%).  The affected vessels/branches are small and not well-suited to PCI (vessel size 2 mm or less). Mild to moderate, non-obstructive coronary artery disease involving the LMCA, LAD, and RCA. Normal left ventricular filling pressure.   History of Present Illness:   John Salazar is a 76 y.o. male with the above problem list.  He was last seen in clinic by John Rough, PA-C in 04/2021. He was seen recently by primary care for fatigue and shortness of breath. There was a question of an inc Right Bundle Branch Block on his EKG and it was recommended he f/u with Cardiology.  He is here alone.  Over the past several weeks, since that he has gotten worse, he has noted fatigue and shortness of breath with exertion.  He would get this going out to his mailbox.  He tends to work outside a lot.  His pressure was decreasing at times.  He has  adjusted his medications at home (skipping amlodipine at times).  He has not had chest discomfort.  He has not had exertional arm, jaw, back discomfort.  He has not had orthopnea, leg edema or syncope.  He was noted to have low B12 and was started on B12.  Since starting on B12, he has noted improved energy and less shortness of breath.    Past Medical History:  Diagnosis Date   Anxiety    Ascending aorta dilation ([place]) 08/22/2021   Echocardiogram 04/2021: 40 mm   Barrett esophagus    BPH (benign prostatic hyperplasia)    Cerebral aneurysm    Chronic kidney disease    stage 2   Colon polyps    Coronary artery disease    mild-mod non-obstructive CAD   Erectile dysfunction    GERD (gastroesophageal reflux disease)    History of kidney stones    History of stomach ulcers    Hypercholesteremia    Hypertension    Positive TB test    in the past   Current Medications: Current Meds  Medication Sig   acetaminophen (TYLENOL) 325 MG tablet Take 650 mg by mouth every 6 (six) hours as needed (pain).   amLODipine (NORVASC) 10 MG tablet Take 5 mg by mouth daily.   clopidogrel (PLAVIX) 75 MG tablet TAKE ONE TABLET BY MOUTH DAILY **NEED OFFICE VISIT**   escitalopram (LEXAPRO) 10 MG tablet Take 1 tablet (10 mg total)  by mouth daily.   famotidine (PEPCID) 20 MG tablet Take 20 mg by mouth daily as needed for heartburn or indigestion.   isosorbide mononitrate (IMDUR) 60 MG 24 hr tablet Take 0.5 tablets (30 mg total) by mouth daily. May take an additional one-half tablet (30 mg) by mouth as needed for chest pain   losartan (COZAAR) 100 MG tablet Take 100 mg by mouth daily.   nitroGLYCERIN (NITROSTAT) 0.4 MG SL tablet Place 1 tablet (0.4 mg total) under the tongue every 5 (five) minutes as needed for chest pain.   pantoprazole (PROTONIX) 40 MG tablet Take 1 tablet (40 mg total) by mouth daily.   rosuvastatin (CRESTOR) 40 MG tablet Take one (1) tablet by mouth ( 40 mg) daily.   tamsulosin (FLOMAX) 0.4 MG  CAPS capsule Take 1 capsule (0.4 mg total) by mouth at bedtime.   Turmeric 500 MG TABS Take 1 tablet by mouth daily.   [DISCONTINUED] aspirin EC 81 MG tablet Take 81 mg by mouth in the morning.    Allergies:   Bee venom and Penicillins   Social History   Tobacco Use   Smoking status: Former    Packs/day: 2.00    Years: 25.00    Total pack years: 50.00    Types: Cigarettes    Quit date: 03/10/1996    Years since quitting: 25.4   Smokeless tobacco: Never  Vaping Use   Vaping Use: Never used  Substance Use Topics   Alcohol use: No    Alcohol/week: 0.0 standard drinks of alcohol    Comment: in the past drank, quit 1993   Drug use: No    Family Hx: The patient's family history includes Aneurysm (age of onset: 34) in his father; Stroke (age of onset: 44) in his mother.  Review of Systems  Respiratory:  Negative for cough.   Gastrointestinal:  Negative for hematochezia.  Genitourinary:  Negative for hematuria.     EKGs/Labs/Other Test Reviewed:    EKG:  EKG is  ordered today.  The ekg ordered today demonstrates NSR, HR 71, normal axis, no ST-T wave changes, QTc 412  Recent Labs: 08/05/2021: ALT 15; BUN 22; Creatinine, Ser 1.65; Hemoglobin 13.6; Platelets 161.0; Potassium 4.8; Sodium 138; TSH 1.86   Recent Lipid Panel Recent Labs    04/19/21 0720  CHOL 124  TRIG 119  HDL 48  LDLCALC 55     Risk Assessment/Calculations/Metrics:              Physical Exam:    VS:  BP 100/60   Pulse 76   Ht '5\' 9"'$  (1.753 m)   Wt 169 lb 9.6 oz (76.9 kg)   SpO2 98%   BMI 25.05 kg/m     Wt Readings from Last 3 Encounters:  08/23/21 169 lb 9.6 oz (76.9 kg)  08/05/21 172 lb 4 oz (78.1 kg)  05/20/21 169 lb 9 oz (76.9 kg)    Constitutional:      Appearance: Healthy appearance. Not in distress.  Neck:     Vascular: No JVR. JVD normal.  Pulmonary:     Effort: Pulmonary effort is normal.     Breath sounds: No wheezing. No rales.  Cardiovascular:     Normal rate. Regular rhythm.  Normal S1. Normal S2.      Murmurs: There is no murmur.  Edema:    Peripheral edema absent.  Abdominal:     Palpations: Abdomen is soft.  Skin:    General: Skin is warm and dry.  Neurological:     General: No focal deficit present.     Mental Status: Alert and oriented to person, place and time.         ASSESSMENT & PLAN:   DOE (dyspnea on exertion) He was recently seen by primary care for fatigue and shortness of breath.  Better since starting on B12.  Chest x-ray did not demonstrate any acute changes.  There was some concern about an incomplete right bundle branch block on his electrocardiogram.  I have reviewed his electrocardiogram from March as well as the one done in June and compared it to the EKG done today.  There have not been any significant changes.  His echocardiogram in March 2023 demonstrated normal LV function and normal PASP.  He does have a prior history of smoking.  I suspect his shortness of breath is multifactorial and related to prior smoking, CAD, age, low B12.  At this point, I do not think he needs to undergo further testing.  He does know to contact us if his symptoms should change or worsen.  I recommend continuing current medical therapy and following up in 6 to 8 weeks.  If he has continued symptoms or if worsening symptoms, we will consider stress testing at that time.  Ascending aorta dilation ([facility]) 40 mm by echocardiogram in March 2023.  He will need a repeat echocardiogram in March 2024.  Carotid artery disease ([place]) History of stenting to the right and left internal carotid artery.  Continue follow-up with Dr. Estanislado Pandy with interventional radiology.  Coronary artery disease involving native coronary artery of native heart with angina pectoris [facility]) Cardiac catheterization in October 2020 with branch vessel disease (80% ostial OM1 branch) and distal LCx/OM 2 (70%).  Lesions were not amenable to PCI and medical therapy has been continued.  He has felt well  since starting on isosorbide.  As noted, he does change his medications sometimes due to blood pressure changes.  I have recommended he try to remain on daily isosorbide.  I have recommended that he adjust his amlodipine to 5 mg in the morning, isosorbide 30 mg in the morning.  He should continue taking losartan 100 mg in the evening.  If his pressure runs low with this, we can reduce the dose of his losartan.  He is not having chest discomfort.  His shortness of breath has improved since starting on B12.  As noted, we will not pursue further testing at this time.  We will see him back in 6 to 8 weeks.  Stress testing will be considered if his symptoms should change, persist or worsen.  Continue aspirin 81 mg daily, Plavix 85 mg daily, Crestor 40 mg daily.  Hyperlipidemia LDL goal <70 LDL optimal on most recent lipid panel.  Continue Crestor 40 mg daily.  Benign essential HTN Blood pressure controlled.  He does tend to run low at times.  Adjust medications as outlined.  As noted, if his blood pressure runs low with this regimen, decrease losartan to 50 mg daily.            Dispo:  Return in about 8 weeks (around 10/18/2021) for Routine Follow Up, w/ Dr. Harrington Challenger, or Richardson Dopp, PA-C.   Medication Adjustments/Labs and Tests Ordered: Current medicines are reviewed at length with the patient today.  Concerns regarding medicines are outlined above.  Tests Ordered: Orders Placed This Encounter  Procedures   EKG 12-Lead   Medication Changes: Meds ordered this encounter  Medications   aspirin  EC 81 MG tablet    Sig: Take 1 tablet (81 mg total) by mouth daily. Swallow whole.    Dispense:  90 tablet    Refill:  3    Order Specific Question:   Supervising Provider    Answer:   Lelon Perla [[id number]]   Signed, Richardson Dopp, PA-C  08/23/2021 10:18 AM    [address], [place], [place]  [address] Phone: [phone]; Fax: [phone]

## 2021-08-23 ENCOUNTER — Encounter: Payer: Self-pay | Admitting: Physician Assistant

## 2021-08-23 ENCOUNTER — Telehealth (HOSPITAL_COMMUNITY): Payer: Self-pay

## 2021-08-23 ENCOUNTER — Ambulatory Visit: Payer: PPO | Admitting: Physician Assistant

## 2021-08-23 VITALS — BP 100/60 | HR 76 | Ht 69.0 in | Wt 169.6 lb

## 2021-08-23 DIAGNOSIS — I25119 Atherosclerotic heart disease of native coronary artery with unspecified angina pectoris: Secondary | ICD-10-CM

## 2021-08-23 DIAGNOSIS — R0609 Other forms of dyspnea: Secondary | ICD-10-CM

## 2021-08-23 DIAGNOSIS — E785 Hyperlipidemia, unspecified: Secondary | ICD-10-CM

## 2021-08-23 DIAGNOSIS — I77810 Thoracic aortic ectasia: Secondary | ICD-10-CM | POA: Diagnosis not present

## 2021-08-23 DIAGNOSIS — I6523 Occlusion and stenosis of bilateral carotid arteries: Secondary | ICD-10-CM | POA: Diagnosis not present

## 2021-08-23 DIAGNOSIS — I10 Essential (primary) hypertension: Secondary | ICD-10-CM

## 2021-08-23 MED ORDER — ASPIRIN 81 MG PO TBEC: 81.0000 mg | DELAYED_RELEASE_TABLET | Freq: Every day | ORAL | 3 refills | Status: DC

## 2021-08-23 NOTE — Assessment & Plan Note (Signed)
History of stenting to the right and left internal carotid artery.  Continue follow-up with Dr. Estanislado Pandy with interventional radiology.

## 2021-08-23 NOTE — Assessment & Plan Note (Signed)
Blood pressure controlled.  He does tend to run low at times.  Adjust medications as outlined.  As noted, if his blood pressure runs low with this regimen, decrease losartan to 50 mg daily.

## 2021-08-23 NOTE — Patient Instructions (Addendum)
Medication Instructions:  Your physician recommends that you continue on your current medications as directed. Please refer to the Current Medication list given to you today.  *If you need a refill on your cardiac medications before your next appointment, please call your pharmacy*   Lab Work: None ordered  If you have labs (blood work) drawn today and your tests are completely normal, you will receive your results only by: Jessie (if you have MyChart) OR A paper copy in the mail If you have any lab test that is abnormal or we need to change your treatment, we will call you to review the results.    Follow-Up: At [facility], you and your health needs are our priority.  As part of our continuing mission to provide you with exceptional heart care, we have created designated Provider Care Teams.  These Care Teams include your primary Cardiologist (physician) and Advanced Practice Providers (APPs -  Physician Assistants and Nurse Practitioners) who all work together to provide you with the care you need, when you need it.  We recommend signing up for the patient portal called "MyChart".  Sign up information is provided on this After Visit Summary.  MyChart is used to connect with patients for Virtual Visits (Telemedicine).  Patients are able to view lab/test results, encounter notes, upcoming appointments, etc.  Non-urgent messages can be sent to your provider as well.   To learn more about what you can do with MyChart, go to [email or web address].    Your next appointment:   6-8 WEEKS  10-08-21 arrive at 10:15  The format for your next appointment:   In Person  Provider:   Dorris Carnes, MD or Richardson Dopp, PA-C   Other Instructions Take your medications on a more regular schedule and not skipping around as you currently are.   Important Information About Sugar

## 2021-08-23 NOTE — Assessment & Plan Note (Signed)
LDL optimal on most recent lipid panel.  Continue Crestor 40 mg daily.

## 2021-08-23 NOTE — Telephone Encounter (Signed)
Pt agreed to f/u in 6 months with a cta head/neck. AW 

## 2021-08-23 NOTE — Assessment & Plan Note (Signed)
40 mm by echocardiogram in March 2023.  He will need a repeat echocardiogram in March 2024.

## 2021-08-23 NOTE — Assessment & Plan Note (Signed)
Cardiac catheterization in October 2020 with branch vessel disease (80% ostial OM1 branch) and distal LCx/OM 2 (70%).  Lesions were not amenable to PCI and medical therapy has been continued.  He has felt well since starting on isosorbide.  As noted, he does change his medications sometimes due to blood pressure changes.  I have recommended he try to remain on daily isosorbide.  I have recommended that he adjust his amlodipine to 5 mg in the morning, isosorbide 30 mg in the morning.  He should continue taking losartan 100 mg in the evening.  If his pressure runs low with this, we can reduce the dose of his losartan.  He is not having chest discomfort.  His shortness of breath has improved since starting on B12.  As noted, we will not pursue further testing at this time.  We will see him back in 6 to 8 weeks.  Stress testing will be considered if his symptoms should change, persist or worsen.  Continue aspirin 81 mg daily, Plavix 85 mg daily, Crestor 40 mg daily.

## 2021-08-23 NOTE — Assessment & Plan Note (Signed)
He was recently seen by primary care for fatigue and shortness of breath.  Better since starting on B12.  Chest x-ray did not demonstrate any acute changes.  There was some concern about an incomplete right bundle branch block on his electrocardiogram.  I have reviewed his electrocardiogram from March as well as the one done in June and compared it to the EKG done today.  There have not been any significant changes.  His echocardiogram in March 2023 demonstrated normal LV function and normal PASP.  He does have a prior history of smoking.  I suspect his shortness of breath is multifactorial and related to prior smoking, CAD, age, low B12.  At this point, I do not think he needs to undergo further testing.  He does know to contact us if his symptoms should change or worsen.  I recommend continuing current medical therapy and following up in 6 to 8 weeks.  If he has continued symptoms or if worsening symptoms, we will consider stress testing at that time.

## 2021-08-30 ENCOUNTER — Encounter: Payer: Self-pay | Admitting: Family Medicine

## 2021-09-14 ENCOUNTER — Ambulatory Visit (INDEPENDENT_AMBULATORY_CARE_PROVIDER_SITE_OTHER): Payer: PPO

## 2021-09-14 DIAGNOSIS — E538 Deficiency of other specified B group vitamins: Secondary | ICD-10-CM | POA: Diagnosis not present

## 2021-09-14 MED ORDER — CYANOCOBALAMIN 1000 MCG/ML IJ SOLN
1000.0000 ug | Freq: Once | INTRAMUSCULAR | Status: AC
  Administered 2021-09-14: 1000 ug via INTRAMUSCULAR

## 2021-09-14 NOTE — Progress Notes (Signed)
Per orders of Dr. Einar Pheasant, injection of Vitamin B 12 given by Ozzie Hoyle. Patient tolerated injection well.

## 2021-09-15 ENCOUNTER — Ambulatory Visit: Payer: PPO | Admitting: Orthopedic Surgery

## 2021-09-17 DIAGNOSIS — D1801 Hemangioma of skin and subcutaneous tissue: Secondary | ICD-10-CM | POA: Diagnosis not present

## 2021-09-17 DIAGNOSIS — L570 Actinic keratosis: Secondary | ICD-10-CM | POA: Diagnosis not present

## 2021-09-17 DIAGNOSIS — L821 Other seborrheic keratosis: Secondary | ICD-10-CM | POA: Diagnosis not present

## 2021-09-17 DIAGNOSIS — Z85828 Personal history of other malignant neoplasm of skin: Secondary | ICD-10-CM | POA: Diagnosis not present

## 2021-09-17 DIAGNOSIS — D485 Neoplasm of uncertain behavior of skin: Secondary | ICD-10-CM | POA: Diagnosis not present

## 2021-09-17 DIAGNOSIS — L814 Other melanin hyperpigmentation: Secondary | ICD-10-CM | POA: Diagnosis not present

## 2021-09-17 DIAGNOSIS — Z08 Encounter for follow-up examination after completed treatment for malignant neoplasm: Secondary | ICD-10-CM | POA: Diagnosis not present

## 2021-09-24 ENCOUNTER — Ambulatory Visit (INDEPENDENT_AMBULATORY_CARE_PROVIDER_SITE_OTHER): Payer: PPO | Admitting: Nurse Practitioner

## 2021-09-24 ENCOUNTER — Ambulatory Visit (INDEPENDENT_AMBULATORY_CARE_PROVIDER_SITE_OTHER)
Admission: RE | Admit: 2021-09-24 | Discharge: 2021-09-24 | Disposition: A | Discharge status: Home / Self Care | Payer: PPO | Source: Ambulatory Visit | Attending: Nurse Practitioner | Admitting: Nurse Practitioner

## 2021-09-24 ENCOUNTER — Encounter: Payer: Self-pay | Admitting: Nurse Practitioner

## 2021-09-24 VITALS — BP 124/82 | HR 75 | Temp 98.5°F | Resp 12 | Ht 69.0 in | Wt 174.1 lb

## 2021-09-24 DIAGNOSIS — M25521 Pain in right elbow: Secondary | ICD-10-CM

## 2021-09-24 DIAGNOSIS — I25119 Atherosclerotic heart disease of native coronary artery with unspecified angina pectoris: Secondary | ICD-10-CM | POA: Diagnosis not present

## 2021-09-24 DIAGNOSIS — K2270 Barrett's esophagus without dysplasia: Secondary | ICD-10-CM

## 2021-09-24 DIAGNOSIS — R7303 Prediabetes: Secondary | ICD-10-CM

## 2021-09-24 DIAGNOSIS — I6523 Occlusion and stenosis of bilateral carotid arteries: Secondary | ICD-10-CM

## 2021-09-24 DIAGNOSIS — N1832 Chronic kidney disease, stage 3b: Secondary | ICD-10-CM | POA: Diagnosis not present

## 2021-09-24 DIAGNOSIS — I671 Cerebral aneurysm, nonruptured: Secondary | ICD-10-CM | POA: Diagnosis not present

## 2021-09-24 DIAGNOSIS — Z7689 Persons encountering health services in other specified circumstances: Secondary | ICD-10-CM

## 2021-09-24 DIAGNOSIS — R0609 Other forms of dyspnea: Secondary | ICD-10-CM

## 2021-09-24 DIAGNOSIS — I10 Essential (primary) hypertension: Secondary | ICD-10-CM

## 2021-09-24 NOTE — Assessment & Plan Note (Signed)
Has been evaluated by Dr. [name].  GI.  Patient currently on Protonix daily and famotidine as needed.

## 2021-09-24 NOTE — Patient Instructions (Signed)
Nice to see you today I will be in touch with the xray once I have a result Follow up with me in 6 months for your physical, sooner if you need me  Dr Frederico Hamman Copland is the sports medicine physician that you can make an appointment with I would use the voltaren gel for the next few days to see if that helps

## 2021-09-24 NOTE — Assessment & Plan Note (Signed)
Patient currently maintained on amlodipine 10 losartan 50 and Imdur 60 mg half tablet as needed exertional dyspnea.

## 2021-09-24 NOTE — Assessment & Plan Note (Signed)
Reviewed last A1c below 6.

## 2021-09-24 NOTE — Assessment & Plan Note (Signed)
Has been seen and evaluated by interventional radiology and coiled.

## 2021-09-24 NOTE — Assessment & Plan Note (Signed)
Not typical for epicondylitis.  Will obtain x-ray.  Use Voltaren gel 4 times daily as needed as directed on box.

## 2021-09-24 NOTE — Progress Notes (Signed)
Established Patient Office Visit  Subjective   Patient ID: John Salazar, male    DOB: 1945/02/13  Age: 76 y.o. MRN: [id number]  Chief Complaint  Patient presents with   Transfer of Care    HPI TOC:  Dr. Dorris Carnes for cardiology.  Dr. Marcene Duos as an orthopedist  Dr. Clarene Essex with [facility] previously, now with [name] or his gastrointestinal provider  [facility]  CAD: States that he is followed by Cardiology once a year. States this year he has seen them approximately 3 times he does have cardiac stents is on clopidogrel.  Brain aneurysm: Has been through IR and coiled   HTN: 0.5 amlodipine at night with losartan and the other amlodipine 0.5 the following morning.  Patient does take half tablet of Imdur on a as needed basis with exertional dyspnea.  Patient is followed by cardiology  B12: Found to be low at previous office visit.  Patient has had 2 B12 injections.  We will wait until he has the third to recheck B12 level.  Patient asked if he should take over-the-counter told to defer until he get B12 level.  Elbow pain: right sided, he does have an orthopedist but may call to make an appointment was scheduled 6 to 8 weeks out.  Patient states he was a large mass that has since gone down.  He is right-hand dominant.  He does carry heavy stuff and that can exacerbate it.  Has tried Tylenol and Voltaren gel without great relief.  No injury per patient report    Review of Systems  Constitutional:  Negative for chills and fever.  Respiratory:  Positive for shortness of breath.   Cardiovascular:  Negative for chest pain and leg swelling.  Gastrointestinal:  Positive for diarrhea. Negative for abdominal pain, nausea and vomiting.       BM daily  Genitourinary:  Negative for dysuria and hematuria.       Nocutria x3  Neurological:  Positive for dizziness. Negative for headaches.  Psychiatric/Behavioral:  Negative for hallucinations and suicidal ideas.        Objective:     BP 124/82   Pulse 75   Temp 98.5 F (36.9 C)   Resp 12   Ht '5\' 9"'$  (1.753 m)   Wt 174 lb 2 oz (79 kg)   SpO2 97%   BMI 25.71 kg/m    Physical Exam Vitals and nursing note reviewed.  Constitutional:      Appearance: Normal appearance.  HENT:     Right Ear: Tympanic membrane, ear canal and external ear normal.     Left Ear: Tympanic membrane and external ear normal.     Ears:     Comments: Wears hearing aids but not in office today     Mouth/Throat:     Mouth: Mucous membranes are moist.     Pharynx: Oropharynx is clear.  Eyes:     Extraocular Movements: Extraocular movements intact.     Pupils: Pupils are equal, round, and reactive to light.  Cardiovascular:     Rate and Rhythm: Normal rate and regular rhythm.     Pulses: Normal pulses.     Heart sounds: Normal heart sounds.  Pulmonary:     Effort: Pulmonary effort is normal.     Breath sounds: Normal breath sounds.  Abdominal:     General: Bowel sounds are normal.  Musculoskeletal:     Right elbow: Normal range of motion. Tenderness present in lateral epicondyle.  Right lower leg: 1+ Pitting Edema present.     Left lower leg: 1+ Pitting Edema present.  Lymphadenopathy:     Cervical: No cervical adenopathy.  Skin:    General: Skin is warm.  Neurological:     General: No focal deficit present.     Mental Status: He is alert.     Deep Tendon Reflexes:     Reflex Scores:      Bicep reflexes are 2+ on the right side and 2+ on the left side.      Patellar reflexes are 2+ on the right side and 2+ on the left side.    Comments: Bilateral upper and lower extremity strength 5/5  Psychiatric:        Mood and Affect: Mood normal.        Behavior: Behavior normal.        Thought Content: Thought content normal.        Judgment: Judgment normal.      No results found for any visits on 09/24/21.    The ASCVD Risk score (Arnett DK, et al., 2019) failed to calculate for the following  reasons:   The valid total cholesterol range is 130 to 320 mg/dL    Assessment & Plan:   Problem List Items Addressed This Visit       Cardiovascular and Mediastinum   Benign essential HTN (Chronic)    Patient currently maintained on amlodipine 10 losartan 50 and Imdur 60 mg half tablet as needed exertional dyspnea.      Coronary artery disease involving native coronary artery of native heart with angina pectoris ([place]) (Chronic)    Patient currently on high intensity statin followed by cardiology.  Currently on Plavix to      Carotid artery disease ([place]) (Chronic)    Is followed by cardiology and on high intensity statin.      Brain aneurysm    Has been seen and evaluated by interventional radiology and coiled.        Digestive   Barrett esophagus    Has been evaluated by Dr. [name].  GI.  Patient currently on Protonix daily and famotidine as needed.        Genitourinary   CKD (chronic kidney disease) stage 3, GFR 30-59 ml/min (HCC)    Reviewed recent labs and June 2023.  Kidney function stable avoid NSAIDs        Other   Prediabetes    Reviewed last A1c below 6.      Establishing care with new doctor, encounter for   DOE (dyspnea on exertion)   Right elbow pain - Primary    Not typical for epicondylitis.  Will obtain x-ray.  Use Voltaren gel 4 times daily as needed as directed on box.      Relevant Orders   DG Elbow Complete Right    Return in about 6 months (around 03/27/2022) for cpe and labs.    Romilda Garret, NP

## 2021-09-24 NOTE — Assessment & Plan Note (Signed)
Is followed by cardiology and on high intensity statin.

## 2021-09-24 NOTE — Assessment & Plan Note (Signed)
Patient currently on high intensity statin followed by cardiology.  Currently on Plavix to

## 2021-09-24 NOTE — Assessment & Plan Note (Signed)
Reviewed recent labs and June 2023.  Kidney function stable avoid NSAIDs

## 2021-09-28 ENCOUNTER — Other Ambulatory Visit: Payer: Self-pay | Admitting: Internal Medicine

## 2021-09-28 ENCOUNTER — Other Ambulatory Visit: Payer: Self-pay | Admitting: Family Medicine

## 2021-09-28 DIAGNOSIS — N400 Enlarged prostate without lower urinary tract symptoms: Secondary | ICD-10-CM

## 2021-09-29 ENCOUNTER — Other Ambulatory Visit: Payer: Self-pay | Admitting: Internal Medicine

## 2021-10-05 ENCOUNTER — Other Ambulatory Visit: Payer: Self-pay | Admitting: Family Medicine

## 2021-10-05 ENCOUNTER — Other Ambulatory Visit: Payer: Self-pay | Admitting: Internal Medicine

## 2021-10-05 DIAGNOSIS — I10 Essential (primary) hypertension: Secondary | ICD-10-CM

## 2021-10-05 DIAGNOSIS — K2270 Barrett's esophagus without dysplasia: Secondary | ICD-10-CM

## 2021-10-07 DIAGNOSIS — L905 Scar conditions and fibrosis of skin: Secondary | ICD-10-CM | POA: Diagnosis not present

## 2021-10-07 NOTE — Progress Notes (Signed)
Cardiology Office Note:    Date:  10/08/2021   ID:  John Salazar, DOB 25-Jan-1946, MRN [id number]  PCP:  Michela Pitcher, NP  [facility] Cardiologist:  Dorris Carnes, MD    Referring MD: Lesleigh Noe, MD   Chief Complaint:  F/u for dyspnea    Patient Profile: Coronary artery disease  Cath 11/2018: dist and small vessel disease in the dLCx/OM1 - Med Rx  Hypertension  Chronic kidney disease  Hyperlipidemia  Carotid artery stenosis S/p L ICA stent 06/2020 (Dr. Estanislado Pandy) S/p R ICA stent in 08/2017 Supraclinoid L ICA aneurysm  S/p embolization 05/2020 (Dr. Estanislado Pandy) GERD  ETOH abuse  Dilated ascending aorta  Echocardiogram 04/2021: 40 mm  Prior CV Studies: ECHOCARDIOGRAM 05/06/21 EF 60-65, no RWMA, normal RVSF, normal PASP (RVSP 26), mild dilation of ascending aorta (40 mm)   CARDIAC CATHETERIZATION 11/15/2018 LM dist 20 LAD ost 35, prox 75 LCx ost 46, mid 23; Lat OM1 80; OM2 70 RCA prox 40 Conclusions: Severe single-vessel coronary artery disease involving a branch of OM1 (80% ostial stenosis) and distal LCx/OM2 (70%).  The affected vessels/branches are small and not well-suited to PCI (vessel size 2 mm or less). Mild to moderate, non-obstructive coronary artery disease involving the LMCA, LAD, and RCA. Normal left ventricular filling pressure.     History of Present Illness:   John Salazar is a 76 y.o. male with the above problem list.  He was last seen in July 2023.  He was seen for fatigue and shortness of breath as well as a question of right bundle branch block on EKG.  Medicines at home were adjusted due to lower blood pressures.  He did note improved symptoms since starting on B12.  His electrocardiograms were reviewed and there were no significant changes.  No further testing was warranted.  He returns for follow-up. He is here alone. He has not had significant shortness of breath since last seen. He notes more issues with extreme heat and humidity.  He has not had chest pain, syncope. He still has some low BPs and sometimes gets dizzy with this. He has not had leg edema.      Past Medical History:  Diagnosis Date   Anxiety    Ascending aorta dilation ([place]) 08/22/2021   Echocardiogram 04/2021: 40 mm   Barrett esophagus    BPH (benign prostatic hyperplasia)    Cerebral aneurysm    Chronic kidney disease    stage 2   Colon polyps    Coronary artery disease    mild-mod non-obstructive CAD   Erectile dysfunction    GERD (gastroesophageal reflux disease)    History of kidney stones    History of stomach ulcers    Hypercholesteremia    Hypertension    Positive TB test    in the past   Current Medications: Current Meds  Medication Sig   acetaminophen (TYLENOL) 325 MG tablet Take 650 mg by mouth every 6 (six) hours as needed (pain).   amLODipine (NORVASC) 10 MG tablet Patient taking 1/2 tablet (5 mg total) by mouth daily   aspirin EC 81 MG tablet Take 1 tablet (81 mg total) by mouth daily. Swallow whole.   clopidogrel (PLAVIX) 75 MG tablet TAKE ONE TABLET BY MOUTH DAILY   escitalopram (LEXAPRO) 10 MG tablet Take 1 tablet (10 mg total) by mouth daily.   famotidine (PEPCID) 20 MG tablet Take 20 mg by mouth daily as needed for heartburn or indigestion.  isosorbide mononitrate (IMDUR) 60 MG 24 hr tablet Take 0.5 tablets (30 mg total) by mouth daily. May take an additional one-half tablet (30 mg) by mouth as needed for chest pain   losartan (COZAAR) 50 MG tablet Take 1 tablet (50 mg total) by mouth daily.   nitroGLYCERIN (NITROSTAT) 0.4 MG SL tablet Place 1 tablet (0.4 mg total) under the tongue every 5 (five) minutes as needed for chest pain.   pantoprazole (PROTONIX) 40 MG tablet Take 1 tablet (40 mg total) by mouth daily.   rosuvastatin (CRESTOR) 40 MG tablet Take one (1) tablet by mouth ( 40 mg) daily.   tamsulosin (FLOMAX) 0.4 MG CAPS capsule Take 1 capsule (0.4 mg total) by mouth at bedtime.   Turmeric 500 MG TABS Take 1 tablet by  mouth as needed (for inflamation).   [DISCONTINUED] losartan (COZAAR) 100 MG tablet Take 1 tablet (100 mg total) by mouth daily.    Allergies:   Bee venom and Penicillins   Social History   Tobacco Use   Smoking status: Former    Packs/day: 2.00    Years: 25.00    Total pack years: 50.00    Types: Cigarettes    Quit date: 03/10/1996    Years since quitting: 25.5   Smokeless tobacco: Never  Vaping Use   Vaping Use: Never used  Substance Use Topics   Alcohol use: No    Alcohol/week: 0.0 standard drinks of alcohol    Comment: in the past drank, quit 1993   Drug use: No    Family Hx: The patient's family history includes Aneurysm (age of onset: 56) in his father; Stroke (age of onset: 42) in his mother.  ROS - See HPI  EKGs/Labs/Other Test Reviewed:    EKG:  EKG is not ordered today.  The ekg ordered today demonstrates n/a  Recent Labs: 08/05/2021: ALT 15; BUN 22; Creatinine, Ser 1.65; Hemoglobin 13.6; Platelets 161.0; Potassium 4.8; Sodium 138; TSH 1.86   Recent Lipid Panel Recent Labs    04/19/21 0720  CHOL 124  TRIG 119  HDL 48  LDLCALC 55     Risk Assessment/Calculations/Metrics:              Physical Exam:    VS:  BP 96/60   Pulse 76   Ht '5\' 9"'$  (1.753 m)   Wt 171 lb 9.6 oz (77.8 kg)   SpO2 98%   BMI 25.34 kg/m     Wt Readings from Last 3 Encounters:  10/08/21 171 lb 9.6 oz (77.8 kg)  09/24/21 174 lb 2 oz (79 kg)  08/23/21 169 lb 9.6 oz (76.9 kg)    Constitutional:      Appearance: Healthy appearance. Not in distress.  Neck:     Vascular: JVD normal.  Pulmonary:     Effort: Pulmonary effort is normal.     Breath sounds: No wheezing. No rales.  Cardiovascular:     Normal rate. Regular rhythm. Normal S1. Normal S2.      Murmurs: There is no murmur.  Edema:    Peripheral edema absent.  Abdominal:     Palpations: Abdomen is soft.  Skin:    General: Skin is warm and dry.  Neurological:     General: No focal deficit present.     Mental  Status: Alert and oriented to person, place and time.          ASSESSMENT & PLAN:   DOE (dyspnea on exertion) Symptoms overall stable/improved. He notes more issues  with extreme heat and humidity. He is not having chest pain. I do not think he needs further testing at this time. We discussed proceeding with stress testing if he has progressively worsening symptoms.   Benign essential HTN BP tends to run low still. Decrease Losartan to 50 mg once daily. Continue Amlodipine 5 mg once daily, Imdur 30 mg once daily. He knows to contact me if his BP is uncontrolled with this or continues to run low.   Coronary artery disease involving native coronary artery of native heart with angina pectoris ([place]) Distal and small vessel disease in the LCx and OM1 managed medically. He is not having angina. His breathing is stable. As noted, I do not think he needs stress testing. He remains on Amlodipine, ASA, Plavix, Imdur, Crestor, NTG prn. F/u 6 mos.   Ascending aorta dilation [facility]) March 2023: 40 mm. Echocardiogram to be repeated in March 2024.            Dispo:  Return in about 6 months (around 04/08/2022) for Routine Follow Up, w/ Dr. Harrington Challenger.   Medication Adjustments/Labs and Tests Ordered: Current medicines are reviewed at length with the patient today.  Concerns regarding medicines are outlined above.  Tests Ordered: No orders of the defined types were placed in this encounter.  Medication Changes: Meds ordered this encounter  Medications   losartan (COZAAR) 50 MG tablet    Sig: Take 1 tablet (50 mg total) by mouth daily.    Dispense:  90 tablet    Refill:  3    Dose change   Signed, Richardson Dopp, PA-C  10/08/2021 11:02 AM    [address], [place], [place]  [address] Phone: [phone]; Fax: [phone]

## 2021-10-08 ENCOUNTER — Encounter: Payer: Self-pay | Admitting: Physician Assistant

## 2021-10-08 ENCOUNTER — Ambulatory Visit: Payer: PPO | Attending: Physician Assistant | Admitting: Physician Assistant

## 2021-10-08 VITALS — BP 96/60 | HR 76 | Ht 69.0 in | Wt 171.6 lb

## 2021-10-08 DIAGNOSIS — I25119 Atherosclerotic heart disease of native coronary artery with unspecified angina pectoris: Secondary | ICD-10-CM | POA: Diagnosis not present

## 2021-10-08 DIAGNOSIS — I77810 Thoracic aortic ectasia: Secondary | ICD-10-CM | POA: Diagnosis not present

## 2021-10-08 DIAGNOSIS — I10 Essential (primary) hypertension: Secondary | ICD-10-CM

## 2021-10-08 DIAGNOSIS — R0609 Other forms of dyspnea: Secondary | ICD-10-CM | POA: Diagnosis not present

## 2021-10-08 DIAGNOSIS — I6523 Occlusion and stenosis of bilateral carotid arteries: Secondary | ICD-10-CM

## 2021-10-08 MED ORDER — LOSARTAN POTASSIUM 50 MG PO TABS: 50.0000 mg | ORAL_TABLET | Freq: Every day | ORAL | 3 refills | Status: DC

## 2021-10-08 NOTE — Assessment & Plan Note (Signed)
Distal and small vessel disease in the LCx and OM1 managed medically. He is not having angina. His breathing is stable. As noted, I do not think he needs stress testing. He remains on Amlodipine, ASA, Plavix, Imdur, Crestor, NTG prn. F/u 6 mos.

## 2021-10-08 NOTE — Patient Instructions (Signed)
Medication Instructions:  Your physician has recommended you make the following change in your medication:   1) DECREASE Losartan to '50mg'$  daily  *If you need a refill on your cardiac medications before your next appointment, please call your pharmacy*  Lab Work: NONE  Testing/Procedures: NONE  Follow-Up: At [facility], you and your health needs are our priority.  As part of our continuing mission to provide you with exceptional heart care, we have created designated Provider Care Teams.  These Care Teams include your primary Cardiologist (physician) and Advanced Practice Providers (APPs -  Physician Assistants and Nurse Practitioners) who all work together to provide you with the care you need, when you need it.  Your next appointment:   6 month(s)  The format for your next appointment:   In Person  Provider:   Dorris Carnes, MD    Important Information About Sugar

## 2021-10-08 NOTE — Assessment & Plan Note (Signed)
March 2023: 40 mm. Echocardiogram to be repeated in March 2024.

## 2021-10-08 NOTE — Assessment & Plan Note (Signed)
Symptoms overall stable/improved. He notes more issues with extreme heat and humidity. He is not having chest pain. I do not think he needs further testing at this time. We discussed proceeding with stress testing if he has progressively worsening symptoms.

## 2021-10-08 NOTE — Assessment & Plan Note (Signed)
BP tends to run low still. Decrease Losartan to 50 mg once daily. Continue Amlodipine 5 mg once daily, Imdur 30 mg once daily. He knows to contact me if his BP is uncontrolled with this or continues to run low.

## 2021-10-19 ENCOUNTER — Ambulatory Visit (INDEPENDENT_AMBULATORY_CARE_PROVIDER_SITE_OTHER): Payer: PPO

## 2021-10-19 ENCOUNTER — Encounter: Payer: Self-pay | Admitting: Family Medicine

## 2021-10-19 ENCOUNTER — Encounter: Payer: Self-pay | Admitting: Nurse Practitioner

## 2021-10-19 DIAGNOSIS — E538 Deficiency of other specified B group vitamins: Secondary | ICD-10-CM | POA: Diagnosis not present

## 2021-10-19 MED ORDER — CYANOCOBALAMIN 1000 MCG/ML IJ SOLN
1000.0000 ug | Freq: Once | INTRAMUSCULAR | Status: AC
  Administered 2021-10-19: 1000 ug via INTRAMUSCULAR

## 2021-10-19 NOTE — Progress Notes (Signed)
Per orders of Romilda Garret NP, injection of Vitamin B 12 in left deltoid given by Ozzie Hoyle. Patient tolerated injection well.

## 2021-10-25 ENCOUNTER — Encounter: Payer: Self-pay | Admitting: Nurse Practitioner

## 2021-10-25 NOTE — Telephone Encounter (Signed)
Advise pt to keep urology appt however let's try to get him into office sooner to be seen to eval for UTI or something else

## 2021-10-26 ENCOUNTER — Encounter: Payer: Self-pay | Admitting: Family

## 2021-10-26 ENCOUNTER — Encounter: Payer: Self-pay | Admitting: Nurse Practitioner

## 2021-10-26 ENCOUNTER — Ambulatory Visit (INDEPENDENT_AMBULATORY_CARE_PROVIDER_SITE_OTHER): Payer: PPO | Admitting: Family

## 2021-10-26 VITALS — BP 122/60 | HR 74 | Temp 98.2°F | Resp 16 | Ht 69.0 in | Wt 173.2 lb

## 2021-10-26 DIAGNOSIS — N400 Enlarged prostate without lower urinary tract symptoms: Secondary | ICD-10-CM

## 2021-10-26 DIAGNOSIS — R339 Retention of urine, unspecified: Secondary | ICD-10-CM | POA: Diagnosis not present

## 2021-10-26 DIAGNOSIS — R350 Frequency of micturition: Secondary | ICD-10-CM | POA: Diagnosis not present

## 2021-10-26 LAB — POC URINALSYSI DIPSTICK (AUTOMATED)
Bilirubin, UA: NEGATIVE
Blood, UA: NEGATIVE
Glucose, UA: NEGATIVE
Ketones, UA: NEGATIVE
Leukocytes, UA: NEGATIVE
Nitrite, UA: NEGATIVE
Protein, UA: NEGATIVE
Spec Grav, UA: <=1.005 — AB (ref 1.010–1.025)
Urobilinogen, UA: 0.2 E.U./dL
pH, UA: 6 (ref 5.0–8.0)

## 2021-10-26 LAB — PSA: PSA: 2.81 ng/mL (ref 0.10–4.00)

## 2021-10-26 NOTE — Assessment & Plan Note (Signed)
psa ordered pending results Will determine if need to treat for uti vs enlarged prostate.

## 2021-10-26 NOTE — Progress Notes (Signed)
Established Patient Office Visit  Subjective:  Patient ID: John Salazar, male    DOB: February 10, 1945  Age: 76 y.o. MRN: [id number]  CC:  Chief Complaint  Patient presents with   Urinary Frequency    2 weeks to a month ago. Gets up more in the night to use the restroom.    HPI WADELL CRADDOCK is here today with concerns.   Increased urinary frequency in the last two weeks. Getting up at night 3-4 times nightly to pee which is cutting into his sleep concerns. Is taking flomax 0.4 mg nightly but no real improvement, has been taking this nightly. Hard at times to start a stream of urine, and not a lot comes out, some urinary retention. No testicular pain.   No fever.   Does have a urology appt in October.   Past Medical History:  Diagnosis Date   Anxiety    Ascending aorta dilation ([place]) 08/22/2021   Echocardiogram 04/2021: 40 mm   Barrett esophagus    BPH (benign prostatic hyperplasia)    Cerebral aneurysm    Chronic kidney disease    stage 2   Colon polyps    Coronary artery disease    mild-mod non-obstructive CAD   Erectile dysfunction    GERD (gastroesophageal reflux disease)    History of kidney stones    History of stomach ulcers    Hypercholesteremia    Hypertension    Positive TB test    in the past    Past Surgical History:  Procedure Laterality Date   APPENDECTOMY  1970   CATARACT EXTRACTION W/ INTRAOCULAR LENS  IMPLANT, BILATERAL     COLONOSCOPY  10/2012   ESOPHAGOGASTRODUODENOSCOPY ENDOSCOPY  10/2012   HERNIA REPAIR     IR 3D INDEPENDENT WKST  04/14/2020   IR ANGIO INTRA EXTRACRAN SEL COM CAROTID INNOMINATE BILAT MOD SED  08/09/2017   IR ANGIO INTRA EXTRACRAN SEL COM CAROTID INNOMINATE BILAT MOD SED  11/08/2018   IR ANGIO INTRA EXTRACRAN SEL COM CAROTID INNOMINATE BILAT MOD SED  04/14/2020   IR ANGIO INTRA EXTRACRAN SEL COM CAROTID INNOMINATE UNI L MOD SED  06/29/2020   IR ANGIO INTRA EXTRACRAN SEL INTERNAL CAROTID UNI L MOD SED  05/20/2020   IR ANGIO VERTEBRAL SEL  VERTEBRAL BILAT MOD SED  08/09/2017   IR ANGIO VERTEBRAL SEL VERTEBRAL BILAT MOD SED  11/08/2018   IR ANGIO VERTEBRAL SEL VERTEBRAL BILAT MOD SED  04/14/2020   IR ANGIOGRAM FOLLOW UP STUDY  05/20/2020   IR INTRAVSC STENT CERV CAROTID W/EMB-PROT MOD SED INCL ANGIO  08/23/2017   IR INTRAVSC STENT CERV CAROTID W/EMB-PROT MOD SED INCL ANGIO  06/29/2020   IR RADIOLOGIST EVAL & MGMT  05/05/2020   IR RADIOLOGIST EVAL & MGMT  06/03/2020   IR RADIOLOGIST EVAL & MGMT  07/15/2020   IR TRANSCATH/EMBOLIZ  05/18/2020   IR US GUIDE VASC ACCESS RIGHT  04/14/2020   IR US GUIDE VASC ACCESS RIGHT  05/20/2020   IR US GUIDE VASC ACCESS RIGHT  06/29/2020   LEFT HEART CATH AND CORONARY ANGIOGRAPHY N/A 11/15/2018   Procedure: LEFT HEART CATH AND CORONARY ANGIOGRAPHY;  Surgeon: Nelva Bush, MD;  Location: [facility];  Service: Cardiovascular;  Laterality: N/A;   PROSTATE SURGERY  1999   20 years ago, enlarged prostate   RADIOLOGY WITH ANESTHESIA N/A 08/23/2017   Procedure: IR WITH ANESTHESIA STENT PLACEMENT;  Surgeon: Luanne Bras, MD;  Location: [facility];  Service: Radiology;  Laterality:  N/A;   RADIOLOGY WITH ANESTHESIA N/A 05/18/2020   Procedure: IR WITH ANESTHESIA EMBOLIZATION;  Surgeon: Luanne Bras, MD;  Location: [facility];  Service: Radiology;  Laterality: N/A;   RADIOLOGY WITH ANESTHESIA Left 06/29/2020   Procedure: RADIOLOGY WITH ANESTHESIA  LEFT CAROTID STENT PLACEMENT;  Surgeon: Luanne Bras, MD;  Location: [facility];  Service: Radiology;  Laterality: Left;    Family History  Problem Relation Age of Onset   Stroke Mother 51   Aneurysm Father 72    Social History   Socioeconomic History   Marital status: Married    Spouse name: pamela   Number of children: 2   Years of education: 12   Highest education level: Not on file  Occupational History   Occupation: Oceanographer.  Tobacco Use   Smoking status: Former    Packs/day: 2.00    Years: 25.00    Total pack years: 50.00     Types: Cigarettes    Quit date: 03/10/1996    Years since quitting: 25.6   Smokeless tobacco: Never  Vaping Use   Vaping Use: Never used  Substance and Sexual Activity   Alcohol use: No    Alcohol/week: 0.0 standard drinks of alcohol    Comment: in the past drank, quit 1993   Drug use: No   Sexual activity: Not Currently  Other Topics Concern   Not on file  Social History Narrative   Lives with [name] (Wife) - together for 68 years   Children - [name] and [name] -- one is a Marine scientist in [facility] and other works for [employer] - 74, lives in [place]   Enjoys - playing golf, goes to [employer] and sponsors people as well, church   Support - family, good friends in [place]   Exercise - walks the dog, runs up hills   Diet - improved from before, tries to drink water - low salt or no salt      Nature conservation officer for [employer]    Social Determinants of Health   Financial Resource Strain: Low Risk  (03/22/2021)   Overall Financial Resource Strain (CARDIA)    Difficulty of Paying Living Expenses: Not very hard  Food Insecurity: No Food Insecurity (03/22/2021)   Hunger Vital Sign    Worried About Running Out of Food in the Last Year: Never true    Ran Out of Food in the Last Year: Never true  Transportation Needs: Not on file  Physical Activity: Not on file  Stress: Not on file  Social Connections: Not on file  Intimate Partner Violence: Not on file    Outpatient Medications Prior to Visit  Medication Sig Dispense Refill   acetaminophen (TYLENOL) 325 MG tablet Take 650 mg by mouth every 6 (six) hours as needed (pain).     amLODipine (NORVASC) 10 MG tablet Patient taking 1/2 tablet (5 mg total) by mouth daily     aspirin EC 81 MG tablet Take 1 tablet (81 mg total) by mouth daily. Swallow whole. 90 tablet 3   clopidogrel (PLAVIX) 75 MG tablet TAKE ONE TABLET BY MOUTH DAILY 90 tablet 3   escitalopram (LEXAPRO) 10 MG tablet Take 1 tablet (10 mg total) by mouth daily. 90 tablet 1    famotidine (PEPCID) 20 MG tablet Take 20 mg by mouth daily as needed for heartburn or indigestion.     isosorbide mononitrate (IMDUR) 60 MG 24 hr tablet Take 0.5 tablets (30 mg total) by mouth daily.  May take an additional one-half tablet (30 mg) by mouth as needed for chest pain 90 tablet 3   losartan (COZAAR) 50 MG tablet Take 1 tablet (50 mg total) by mouth daily. 90 tablet 3   pantoprazole (PROTONIX) 40 MG tablet Take 1 tablet (40 mg total) by mouth daily. 90 tablet 1   rosuvastatin (CRESTOR) 40 MG tablet Take one (1) tablet by mouth ( 40 mg) daily. 90 tablet 3   tamsulosin (FLOMAX) 0.4 MG CAPS capsule Take 1 capsule (0.4 mg total) by mouth at bedtime. 90 capsule 1   Turmeric 500 MG TABS Take 1 tablet by mouth as needed (for inflamation).     nitroGLYCERIN (NITROSTAT) 0.4 MG SL tablet Place 1 tablet (0.4 mg total) under the tongue every 5 (five) minutes as needed for chest pain. 25 tablet prn   No facility-administered medications prior to visit.    Allergies  Allergen Reactions   Bee Venom Anaphylaxis and Shortness Of Breath   Penicillins Rash    PATIENT HAS HAD A PCN REACTION WITH IMMEDIATE RASH, FACIAL/TONGUE/THROAT SWELLING, SOB, OR LIGHTHEADEDNESS WITH HYPOTENSION:  #  #  YES  #  #  Has patient had a PCN reaction causing severe rash involving mucus membranes or skin necrosis: no Has patient had a PCN reaction that required hospitalization: no Has patient had a PCN reaction occurring within the last 10 years: no         Objective:    Physical Exam Constitutional:      General: He is not in acute distress.    Appearance: Normal appearance. He is not ill-appearing, toxic-appearing or diaphoretic.  Pulmonary:     Effort: Pulmonary effort is normal.  Abdominal:     Tenderness: There is abdominal tenderness (left lower suprapubic tenderness very mild on palpation). There is no right CVA tenderness or left CVA tenderness.  Neurological:     General: No focal deficit present.      Mental Status: He is alert and oriented to person, place, and time. Mental status is at baseline.  Psychiatric:        Mood and Affect: Mood normal.        Behavior: Behavior normal.        Thought Content: Thought content normal.        Judgment: Judgment normal.     BP 122/60   Pulse 74   Temp 98.2 F (36.8 C)   Resp 16   Ht '5\' 9"'$  (1.753 m)   Wt 173 lb 4 oz (78.6 kg)   SpO2 97%   BMI 25.58 kg/m  Wt Readings from Last 3 Encounters:  10/26/21 173 lb 4 oz (78.6 kg)  10/08/21 171 lb 9.6 oz (77.8 kg)  09/24/21 174 lb 2 oz (79 kg)     Health Maintenance Due  Topic Date Due   COVID-19 Vaccine (4 - Pfizer series) 10/13/2020   TETANUS/TDAP  11/11/2020    There are no preventive care reminders to display for this patient.  Lab Results  Component Value Date   TSH 1.86 08/05/2021   Lab Results  Component Value Date   WBC 4.2 08/05/2021   HGB 13.6 08/05/2021   HCT 41.0 08/05/2021   MCV 90.5 08/05/2021   PLT 161.0 08/05/2021   Lab Results  Component Value Date   NA 138 08/05/2021   K 4.8 08/05/2021   CO2 29 08/05/2021   GLUCOSE 99 08/05/2021   BUN 22 08/05/2021   CREATININE 1.65 (H) 08/05/2021  BILITOT 0.6 08/05/2021   ALKPHOS 87 08/05/2021   AST 18 08/05/2021   ALT 15 08/05/2021   PROT 6.9 08/05/2021   ALBUMIN 4.4 08/05/2021   CALCIUM 9.6 08/05/2021   ANIONGAP 5 06/30/2020   GFR 39.98 (L) 08/05/2021   Lab Results  Component Value Date   HGBA1C 5.9 05/20/2021      Assessment & Plan:   Problem List Items Addressed This Visit       Genitourinary   BPH (benign prostatic hyperplasia)    psa ordered pending results Will determine if need to treat for uti vs enlarged prostate.       Urinary retention   Relevant Orders   PSA     Other   Urinary frequency - Primary    poct urine dip in office Urine culture ordered pending results       Relevant Orders   Urine Culture   PSA   POCT Urinalysis Dipstick (Automated) (Completed)    No orders  of the defined types were placed in this encounter.   Follow-up: No follow-ups on file.    Eugenia Pancoast, FNP

## 2021-10-26 NOTE — Assessment & Plan Note (Signed)
poct urine dip in office Urine culture ordered pending results  

## 2021-10-27 NOTE — Telephone Encounter (Signed)
[facility] Night - Client Nonclinical Telephone Record  [facility] Client [facility] Night - Client Client Site [place] - Night Provider Romilda Garret- NP Contact Type Call Who Is Calling Patient / Member / Family / Caregiver Caller Name Medina Phone Number [phone] Patient Name John Salazar Patient DOB 1946/01/07 Call Type Message Only Information Provided Reason for Call Request to Schedule Office Appointment Initial Comment Caller states he is a patient here. He was supposed to set an appt. with Romilda Garret in February 2024 if possible to schedule that far out. Patient request to speak to RN No Disp. Time Disposition Final User 10/26/2021 5:05:44 PM General Information Provided Yes Junius Creamer Call Closed By: Junius Creamer Transaction Date/Time: 10/26/2021 5:01:46 PM (ET

## 2021-10-28 ENCOUNTER — Other Ambulatory Visit: Payer: Self-pay | Admitting: Family

## 2021-10-28 DIAGNOSIS — N400 Enlarged prostate without lower urinary tract symptoms: Secondary | ICD-10-CM

## 2021-10-28 LAB — URINE CULTURE
MICRO NUMBER:: 13937814
Result:: NO GROWTH
SPECIMEN QUALITY:: ADEQUATE

## 2021-10-28 MED ORDER — TAMSULOSIN HCL 0.4 MG PO CAPS: ORAL_CAPSULE | ORAL | 2 refills | Status: DC

## 2021-11-08 ENCOUNTER — Ambulatory Visit: Payer: PPO | Admitting: Podiatry

## 2021-11-10 NOTE — Progress Notes (Signed)
Patent did not view their my chart test result that I responded to one week ago.  Please call them and advise them of the below results.

## 2021-11-11 DIAGNOSIS — R351 Nocturia: Secondary | ICD-10-CM | POA: Diagnosis not present

## 2021-11-11 DIAGNOSIS — N401 Enlarged prostate with lower urinary tract symptoms: Secondary | ICD-10-CM | POA: Diagnosis not present

## 2021-11-12 ENCOUNTER — Other Ambulatory Visit: Payer: Self-pay | Admitting: Family

## 2021-11-12 DIAGNOSIS — E538 Deficiency of other specified B group vitamins: Secondary | ICD-10-CM

## 2021-11-12 DIAGNOSIS — Z79899 Other long term (current) drug therapy: Secondary | ICD-10-CM

## 2021-11-12 DIAGNOSIS — E782 Mixed hyperlipidemia: Secondary | ICD-10-CM

## 2021-11-15 ENCOUNTER — Ambulatory Visit: Payer: PPO | Admitting: Podiatry

## 2021-11-15 ENCOUNTER — Encounter: Payer: Self-pay | Admitting: Podiatry

## 2021-11-15 DIAGNOSIS — M7672 Peroneal tendinitis, left leg: Secondary | ICD-10-CM | POA: Diagnosis not present

## 2021-11-15 MED ORDER — TRIAMCINOLONE ACETONIDE 10 MG/ML IJ SUSP
10.0000 mg | Freq: Once | INTRAMUSCULAR | Status: AC
  Administered 2021-11-15: 10 mg

## 2021-11-15 NOTE — Progress Notes (Signed)
Subjective:   Patient ID: John Salazar, male   DOB: 76 y.o.   MRN: [id number]   HPI Patient states he developed a lot of pain on the outside of his left foot and its been inflamed and he does not remember injury and has been present for around a month neurovascular   ROS      Objective:  Physical Exam  Status intact with inflammation pain around the peroneal insertion base of fifth metatarsal left     Assessment:  Acute peroneal tendinitis left     Plan:  Reviewed condition and went ahead explain injection and risk did sterile prep and injected the peroneal insertion 3 mg Dexasone Kenalog 5 mg Xylocaine tolerated well reappoint to recheck

## 2021-11-16 ENCOUNTER — Ambulatory Visit (INDEPENDENT_AMBULATORY_CARE_PROVIDER_SITE_OTHER): Payer: PPO

## 2021-11-16 ENCOUNTER — Other Ambulatory Visit (INDEPENDENT_AMBULATORY_CARE_PROVIDER_SITE_OTHER): Payer: PPO

## 2021-11-16 DIAGNOSIS — Z79899 Other long term (current) drug therapy: Secondary | ICD-10-CM | POA: Diagnosis not present

## 2021-11-16 DIAGNOSIS — E538 Deficiency of other specified B group vitamins: Secondary | ICD-10-CM

## 2021-11-16 DIAGNOSIS — E782 Mixed hyperlipidemia: Secondary | ICD-10-CM | POA: Diagnosis not present

## 2021-11-16 LAB — COMPREHENSIVE METABOLIC PANEL
ALT: 13 U/L (ref 0–53)
AST: 14 U/L (ref 0–37)
Albumin: 4.2 g/dL (ref 3.5–5.2)
Alkaline Phosphatase: 79 U/L (ref 39–117)
BUN: 21 mg/dL (ref 6–23)
CO2: 26 mEq/L (ref 19–32)
Calcium: 9.5 mg/dL (ref 8.4–10.5)
Chloride: 105 mEq/L (ref 96–112)
Creatinine, Ser: 1.57 mg/dL — ABNORMAL HIGH (ref 0.40–1.50)
GFR: 42.36 mL/min — ABNORMAL LOW (ref >60.00)
Glucose, Bld: 91 mg/dL (ref 70–99)
Potassium: 4.5 mEq/L (ref 3.5–5.1)
Sodium: 138 mEq/L (ref 135–145)
Total Bilirubin: 0.6 mg/dL (ref 0.2–1.2)
Total Protein: 6.9 g/dL (ref 6.0–8.3)

## 2021-11-16 LAB — CBC
HCT: 39.9 % (ref 39.0–52.0)
Hemoglobin: 13.5 g/dL (ref 13.0–17.0)
MCHC: 33.7 g/dL (ref 30.0–36.0)
MCV: 89.4 fl (ref 78.0–100.0)
Platelets: 169 10*3/uL (ref 150.0–400.0)
RBC: 4.46 Mil/uL (ref 4.22–5.81)
RDW: 13.5 % (ref 11.5–15.5)
WBC: 5 10*3/uL (ref 4.0–10.5)

## 2021-11-16 LAB — LIPID PANEL
Cholesterol: 116 mg/dL (ref 0–200)
HDL: 48.1 mg/dL (ref >39.00)
LDL Cholesterol: 48 mg/dL (ref 0–99)
NonHDL: 68.02
Total CHOL/HDL Ratio: 2
Triglycerides: 101 mg/dL (ref 0.0–149.0)
VLDL: 20.2 mg/dL (ref 0.0–40.0)

## 2021-11-16 MED ORDER — CYANOCOBALAMIN 1000 MCG/ML IJ SOLN
1000.0000 ug | Freq: Once | INTRAMUSCULAR | Status: AC
  Administered 2021-11-16: 1000 ug via INTRAMUSCULAR

## 2021-11-16 NOTE — Progress Notes (Signed)
Per orders of Eugenia Pancoast, FNP, injection of B-12 given by Francella Solian in right deltoid. Patient tolerated injection well. Patient will make appointment for 1 month. Patient was 2 days early for B-12 but verified ok to give with Romilda Garret, NP

## 2021-11-17 ENCOUNTER — Encounter: Payer: Self-pay | Admitting: Family

## 2021-11-17 LAB — B12 AND FOLATE PANEL
Folate: 18.1 ng/mL (ref >5.9)
Vitamin B-12: 284 pg/mL (ref 211–911)

## 2021-11-18 ENCOUNTER — Ambulatory Visit: Payer: PPO

## 2021-11-18 ENCOUNTER — Other Ambulatory Visit: Payer: PPO

## 2021-11-22 NOTE — Telephone Encounter (Signed)
Please call to set pt up for monthly b12 injections x 3. Then lab only in three months to repeat b12. I have sent pt mychart message advising him of plan.

## 2021-12-14 NOTE — Telephone Encounter (Signed)
Please call pt to schedule

## 2021-12-14 NOTE — Telephone Encounter (Signed)
Called and did the next 3 B-12 appt.

## 2021-12-20 ENCOUNTER — Ambulatory Visit (INDEPENDENT_AMBULATORY_CARE_PROVIDER_SITE_OTHER): Payer: PPO

## 2021-12-20 DIAGNOSIS — E538 Deficiency of other specified B group vitamins: Secondary | ICD-10-CM

## 2021-12-20 MED ORDER — CYANOCOBALAMIN 1000 MCG/ML IJ SOLN
1000.0000 ug | Freq: Once | INTRAMUSCULAR | Status: AC
  Administered 2021-12-20: 1000 ug via INTRAMUSCULAR

## 2021-12-20 NOTE — Progress Notes (Signed)
Per orders of Eugenia Pancoast, NP, injection of B-12 given by Francella Solian in left deltoid. Patient tolerated injection well. Patient will make appointment for 1 month.

## 2021-12-21 ENCOUNTER — Ambulatory Visit: Payer: PPO

## 2021-12-23 ENCOUNTER — Ambulatory Visit: Payer: PPO

## 2022-01-03 ENCOUNTER — Other Ambulatory Visit: Payer: Self-pay

## 2022-01-03 DIAGNOSIS — R351 Nocturia: Secondary | ICD-10-CM | POA: Diagnosis not present

## 2022-01-03 DIAGNOSIS — N401 Enlarged prostate with lower urinary tract symptoms: Secondary | ICD-10-CM | POA: Diagnosis not present

## 2022-01-03 DIAGNOSIS — F325 Major depressive disorder, single episode, in full remission: Secondary | ICD-10-CM

## 2022-01-03 NOTE — Telephone Encounter (Signed)
Refill request from the pharmacy on Lexapro. Last filled by Dr Einar Pheasant

## 2022-01-04 MED ORDER — ESCITALOPRAM OXALATE 10 MG PO TABS: 10.0000 mg | ORAL_TABLET | Freq: Every day | ORAL | 1 refills | Status: DC

## 2022-01-12 ENCOUNTER — Telehealth: Payer: Self-pay

## 2022-01-12 NOTE — Progress Notes (Signed)
Chronic Care Management Pharmacy Assistant   Name: John Salazar  MRN: [id number] DOB: [birth year]  Reason for Encounter: CCM (Hypertension Disease State)  Recent office visits:  12/20/21 Administered: cyanocobalamin ((VITAMIN B-12)) injection 1,000 mcg  11/16/21 Administered: cyanocobalamin ((VITAMIN B-12)) injection 1,000 mcg  10/19/21 Administered: cyanocobalamin ((VITAMIN B-12)) injection 1,000 mcg  09/24/21 [name], NP Right elbow pain (transfer of care). Ordered: DG Elbow Right FU 6 months 09/14/21 Administered: cyanocobalamin ((VITAMIN B-12)) injection 1,000 mcg  08/12/2021 Administered: cyanocobalamin ((VITAMIN B-12)) injection 1,000 mcg   Recent consult visits:  11/15/21 Ila Mcgill, DPM (Podiatry) Peroneal Tendinitis, left Administered: triamcinolone acetonide (KENALOG) 10 MG/ML injection 10 mg 10/26/21 Eugenia Pancoast, NP Urinary Labs "Lets increase flomax to 0.8 mg daily. Urine came back negative for infection, so I think a referral to urology (unless already seeing one) would also be a good idea. Let me know if you need a referral"  No med changes 10/08/21  Richardson Dopp, PA (Cardiology) DOE Change: Amlodipine 10 mg take 0.5 tab once daily Change: Losartan 50 mg FU 6 months 10/07/21 Darrick Meigs (Dermatology) Scar conditions and fibrosis of skin 09/17/21 [name] (Pathology) Actinic Keratosis No other information 08/23/21 Richardson Dopp, PA (Cardiology) HTN Ordered: EKG Change: Aspirin 81 mg FU 6-8 weeks 08/19/21 Francis Dowse (Optometry) Hypertensive retinopathy, bilateral No other information   Hospital visits:  None in previous 6 months  Medications: Outpatient Encounter Medications as of 01/12/2022  Medication Sig   acetaminophen (TYLENOL) 325 MG tablet Take 650 mg by mouth every 6 (six) hours as needed (pain).   amLODipine (NORVASC) 10 MG tablet Patient taking 1/2 tablet (5 mg total) by mouth daily   aspirin EC 81 MG tablet Take 1 tablet (81 mg total) by  mouth daily. Swallow whole.   clopidogrel (PLAVIX) 75 MG tablet TAKE ONE TABLET BY MOUTH DAILY   escitalopram (LEXAPRO) 10 MG tablet Take 1 tablet (10 mg total) by mouth daily.   famotidine (PEPCID) 20 MG tablet Take 20 mg by mouth daily as needed for heartburn or indigestion.   isosorbide mononitrate (IMDUR) 60 MG 24 hr tablet Take 0.5 tablets (30 mg total) by mouth daily. May take an additional one-half tablet (30 mg) by mouth as needed for chest pain   losartan (COZAAR) 50 MG tablet Take 1 tablet (50 mg total) by mouth daily.   nitroGLYCERIN (NITROSTAT) 0.4 MG SL tablet Place 1 tablet (0.4 mg total) under the tongue every 5 (five) minutes as needed for chest pain.   pantoprazole (PROTONIX) 40 MG tablet Take 1 tablet (40 mg total) by mouth daily.   rosuvastatin (CRESTOR) 40 MG tablet Take one (1) tablet by mouth ( 40 mg) daily.   tamsulosin (FLOMAX) 0.4 MG CAPS capsule Take two capsules once daily (total 0.8 mg) thirty minutes after same meal daily   Turmeric 500 MG TABS Take 1 tablet by mouth as needed (for inflamation).   No facility-administered encounter medications on file as of 01/12/2022.   Contacted patient on 01/19/2022 to discuss hypertension disease state  Current antihypertensive regimen:  Amlodipine 5 mg daily HS  Losartan 100 mg daily HS  Isosorbide MN 60 mg -1/2 tab daily  Patient verbally confirms she is taking the above medications as directed. Yes  How often are you checking your Blood Pressure? infrequently  she checks her blood pressure in the morning after taking her medication.  I have asked patient to take their blood pressure daily and keep a log. Advised patient I  would call back on 01/26/2022 for log. Patient verbalized understanding and agreed.   Wrist or arm cuff: Arm cuff Caffeine intake: Coffee - 2 cups Tea - 2 cups  Salt intake: Patients watches his salt intake Over the counter medications including pseudoephedrine or NSAIDs? Tylenol 2  occasionally  Any readings above 180/120? No  What recent interventions/DTPs have been made by any provider to improve Blood Pressure control since last CPP Visit: No recent interventions  Any recent hospitalizations or ED visits since last visit with CPP? No  What diet changes have been made to improve Blood Pressure Control?  Patient watches his salt intake. Patient also states he does not eat fatty foods.   What exercise is being done to improve your Blood Pressure Control?  In nice weather patient will work in his yard.   Adherence Review: Is the patient currently on ACE/ARB medication? No Does the patient have >5 day gap between last estimated fill dates? No   Star Rating Drugs:  Medication:                Last Fill:         Day Supply Rosuvastatin 40 mg    01/06/22          90   Care Gaps: Annual wellness visit in last year? Yes 05/20/2021 Most Recent BP reading: 122/60 on 10/26/2021   Summary of recommendations from last [place] visit (Date:05/24/2021)  Summary: CCM F/U visit -BP is at goal at home on average; range 102/72-126/74 -LDL improved to 55 (04/2021). Discussed goal is at least < 70 so no additional medication is needed.   Recommendations/Changes made from today's visit: -No med changes   Plan: -[place] will call patient 3 months for BP update -Pharmacist follow up televisit scheduled for 1 year  CCM appointment on 05/24/2022  [name], CPP notified  [name], [place] Assistant [phone]

## 2022-01-15 ENCOUNTER — Other Ambulatory Visit: Payer: Self-pay

## 2022-01-15 ENCOUNTER — Emergency Department (HOSPITAL_COMMUNITY)
Admission: EM | Admit: 2022-01-15 | Discharge: 2022-01-15 | Disposition: A | Discharge status: Home / Self Care | Payer: PPO | Attending: Emergency Medicine | Admitting: Emergency Medicine

## 2022-01-15 ENCOUNTER — Emergency Department (HOSPITAL_COMMUNITY): Payer: PPO

## 2022-01-15 DIAGNOSIS — Z7982 Long term (current) use of aspirin: Secondary | ICD-10-CM | POA: Insufficient documentation

## 2022-01-15 DIAGNOSIS — N189 Chronic kidney disease, unspecified: Secondary | ICD-10-CM | POA: Insufficient documentation

## 2022-01-15 DIAGNOSIS — R066 Hiccough: Secondary | ICD-10-CM | POA: Diagnosis not present

## 2022-01-15 DIAGNOSIS — Z7902 Long term (current) use of antithrombotics/antiplatelets: Secondary | ICD-10-CM | POA: Diagnosis not present

## 2022-01-15 DIAGNOSIS — N179 Acute kidney failure, unspecified: Secondary | ICD-10-CM | POA: Diagnosis not present

## 2022-01-15 DIAGNOSIS — Z79899 Other long term (current) drug therapy: Secondary | ICD-10-CM | POA: Insufficient documentation

## 2022-01-15 DIAGNOSIS — I129 Hypertensive chronic kidney disease with stage 1 through stage 4 chronic kidney disease, or unspecified chronic kidney disease: Secondary | ICD-10-CM | POA: Diagnosis not present

## 2022-01-15 LAB — COMPREHENSIVE METABOLIC PANEL
ALT: 19 U/L (ref 0–44)
AST: 21 U/L (ref 15–41)
Albumin: 3.7 g/dL (ref 3.5–5.0)
Alkaline Phosphatase: 70 U/L (ref 38–126)
Anion gap: 9 (ref 5–15)
BUN: 21 mg/dL (ref 8–23)
CO2: 23 mmol/L (ref 22–32)
Calcium: 9.1 mg/dL (ref 8.9–10.3)
Chloride: 107 mmol/L (ref 98–111)
Creatinine, Ser: 1.99 mg/dL — ABNORMAL HIGH (ref 0.61–1.24)
GFR, Estimated: 34 mL/min — ABNORMAL LOW (ref >60)
Glucose, Bld: 98 mg/dL (ref 70–99)
Potassium: 4.3 mmol/L (ref 3.5–5.1)
Sodium: 139 mmol/L (ref 135–145)
Total Bilirubin: 0.7 mg/dL (ref 0.3–1.2)
Total Protein: 6.7 g/dL (ref 6.5–8.1)

## 2022-01-15 LAB — CBC WITH DIFFERENTIAL/PLATELET
Abs Immature Granulocytes: 0.01 10*3/uL (ref 0.00–0.07)
Basophils Absolute: 0 10*3/uL (ref 0.0–0.1)
Basophils Relative: 1 %
Eosinophils Absolute: 0.1 10*3/uL (ref 0.0–0.5)
Eosinophils Relative: 3 %
HCT: 39.7 % (ref 39.0–52.0)
Hemoglobin: 12.9 g/dL — ABNORMAL LOW (ref 13.0–17.0)
Immature Granulocytes: 0 %
Lymphocytes Relative: 21 %
Lymphs Abs: 1.1 10*3/uL (ref 0.7–4.0)
MCH: 29.8 pg (ref 26.0–34.0)
MCHC: 32.5 g/dL (ref 30.0–36.0)
MCV: 91.7 fL (ref 80.0–100.0)
Monocytes Absolute: 0.6 10*3/uL (ref 0.1–1.0)
Monocytes Relative: 12 %
Neutro Abs: 3.2 10*3/uL (ref 1.7–7.7)
Neutrophils Relative %: 63 %
Platelets: 158 10*3/uL (ref 150–400)
RBC: 4.33 MIL/uL (ref 4.22–5.81)
RDW: 13.2 % (ref 11.5–15.5)
WBC: 5.1 10*3/uL (ref 4.0–10.5)
nRBC: 0 % (ref 0.0–0.2)

## 2022-01-15 MED ORDER — SODIUM CHLORIDE 0.9 % IV BOLUS: 1000.0000 mL | Freq: Once | INTRAVENOUS | Status: DC

## 2022-01-15 MED ORDER — METOCLOPRAMIDE HCL 10 MG PO TABS
10.0000 mg | ORAL_TABLET | Freq: Once | ORAL | Status: AC
  Administered 2022-01-15: 10 mg via ORAL
  Filled 2022-01-15: qty 1

## 2022-01-15 MED ORDER — METOCLOPRAMIDE HCL 10 MG PO TABS: 10.0000 mg | ORAL_TABLET | Freq: Three times a day (TID) | ORAL | 0 refills | Status: DC

## 2022-01-15 NOTE — ED Provider Notes (Signed)
[facility] EMERGENCY DEPARTMENT Provider Note   CSN: [id number] Arrival date & time: 01/15/22  1062     History  Chief Complaint  Patient presents with   Hiccups    John Salazar is a 76 y.o. male.  HPI 76 year old male presents with intractable hiccups.  Started 2 days ago.  4 days ago he had a right mandibular tooth pulled.  However he states he has been doing fine since then.  He thinks he might of gotten water in his ear a couple weeks ago but no ear pain.  He denies fever, cough, chest pain, shortness of breath, abdominal pain.  He has tried multiple things including sugar, water, breathing into a bag, etc.  Nothing has helped the hiccups. He is chronically on Protonix for GERD.  Home Medications Prior to Admission medications   Medication Sig Start Date End Date Taking? Authorizing Provider  metoCLOPramide (REGLAN) 10 MG tablet Take 1 tablet (10 mg total) by mouth every 8 (eight) hours for 10 days. 01/15/22 01/25/22 Yes Sherwood Gambler, MD  acetaminophen (TYLENOL) 325 MG tablet Take 650 mg by mouth every 6 (six) hours as needed (pain).    [provider]  amLODipine (NORVASC) 10 MG tablet Patient taking 1/2 tablet (5 mg total) by mouth daily    [provider]  aspirin EC 81 MG tablet Take 1 tablet (81 mg total) by mouth daily. Swallow whole. 08/23/21   Richardson Dopp T, PA-C  clopidogrel (PLAVIX) 75 MG tablet TAKE ONE TABLET BY MOUTH DAILY 09/29/21   Fay Records, MD  escitalopram (LEXAPRO) 10 MG tablet Take 1 tablet (10 mg total) by mouth daily. 01/04/22   Eugenia Pancoast, FNP  famotidine (PEPCID) 20 MG tablet Take 20 mg by mouth daily as needed for heartburn or indigestion.    [provider]  isosorbide mononitrate (IMDUR) 60 MG 24 hr tablet Take 0.5 tablets (30 mg total) by mouth daily. May take an additional one-half tablet (30 mg) by mouth as needed for chest pain 04/20/21   Elgie Collard, PA-C  losartan (COZAAR) 50 MG tablet Take  1 tablet (50 mg total) by mouth daily. 10/08/21   Richardson Dopp T, PA-C  nitroGLYCERIN (NITROSTAT) 0.4 MG SL tablet Place 1 tablet (0.4 mg total) under the tongue every 5 (five) minutes as needed for chest pain. 11/15/18 10/08/21  End, Harrell Gave, MD  pantoprazole (PROTONIX) 40 MG tablet Take 1 tablet (40 mg total) by mouth daily. 10/06/21   Michela Pitcher, NP  rosuvastatin (CRESTOR) 40 MG tablet Take one (1) tablet by mouth ( 40 mg) daily. 04/20/21   Elgie Collard, PA-C  tamsulosin (FLOMAX) 0.4 MG CAPS capsule Take two capsules once daily (total 0.8 mg) thirty minutes after same meal daily 10/28/21   Eugenia Pancoast, FNP  Turmeric 500 MG TABS Take 1 tablet by mouth as needed (for inflamation).    [provider]      Allergies    Bee venom and Penicillins    Review of Systems   Review of Systems  Constitutional:  Negative for fever.  Respiratory:  Negative for cough and shortness of breath.   Cardiovascular:  Negative for chest pain.  Gastrointestinal:  Positive for constipation (chronic). Negative for abdominal pain.    Physical Exam Updated Vital Signs BP 134/75   Pulse 82   Temp 98.2 F (36.8 C)   Resp 18   SpO2 99%  Physical Exam Vitals and nursing note reviewed.  Constitutional:      Appearance: He is well-developed.     Comments: hiccups  HENT:     Head: Normocephalic and atraumatic.     Right Ear: Tympanic membrane and ear canal normal.     Left Ear: Tympanic membrane and ear canal normal.     Mouth/Throat:     Comments: Small base of tooth remaining in right lower jaw. However no signs of acute infection. No airway/oral swelling Cardiovascular:     Rate and Rhythm: Normal rate and regular rhythm.     Heart sounds: Normal heart sounds.  Pulmonary:     Effort: Pulmonary effort is normal.     Breath sounds: Normal breath sounds.  Abdominal:     General: There is no distension.     Palpations: Abdomen is soft.     Tenderness: There is no abdominal tenderness.   Skin:    General: Skin is warm and dry.  Neurological:     Mental Status: He is alert.     ED Results / Procedures / Treatments   Labs (all labs ordered are listed, but only abnormal results are displayed) Labs Reviewed  COMPREHENSIVE METABOLIC PANEL - Abnormal; Notable for the following components:      Result Value   Creatinine, Ser 1.99 (*)    GFR, Estimated 34 (*)    All other components within normal limits  CBC WITH DIFFERENTIAL/PLATELET - Abnormal; Notable for the following components:   Hemoglobin 12.9 (*)    All other components within normal limits    EKG None  Radiology DG Chest 2 View  Result Date: 01/15/2022 CLINICAL DATA:  Three days of hiccups EXAM: CHEST - 2 VIEW COMPARISON:  Chest radiograph dated 08/05/2021 FINDINGS: Symmetric elevation of the hemidiaphragms. Low lung volumes. No focal consolidations. No pleural effusion or pneumothorax. The heart size and mediastinal contours are within normal limits. The visualized skeletal structures are unremarkable. Partially imaged stent graft projects over the left cervical region. IMPRESSION: Low lung volumes with symmetric elevation of the hemidiaphragms. No active cardiopulmonary disease. Electronically Signed   By: Darrin Nipper M.D.   On: 01/15/2022 11:29    Procedures Procedures    Medications Ordered in ED Medications  metoCLOPramide (REGLAN) tablet 10 mg (10 mg Oral Given 01/15/22 1055)    ED Course/ Medical Decision Making/ A&P                           Medical Decision Making Amount and/or Complexity of Data Reviewed External Data Reviewed: notes. Labs: ordered.    Details: Acute on chronic kidney injury with a creatinine up to 1.99.  He does have chronic kidney disease at baseline.  Electrolytes are unremarkable. Radiology: ordered and independent interpretation performed.    Details: No mass or pneumonia.  Risk Prescription drug management.   Patient was given oral Reglan for his hiccups.   Otherwise, he is well appearing. There is no clear cause for his hiccups. No apparent medical emergency. Will trial reglan at home, increase protonix dose.  He has noted to have an increase in his creatinine from baseline.  It is mild compared to his chronic kidney disease.  Offered him IV fluids though he has declined and wants to just increase his p.o. intake at home which I think is reasonable.  I advised him to get this rechecked.  Otherwise, will discharge home with return precautions.         Final Clinical Impression(s) /  ED Diagnoses Final diagnoses:  Hiccups  Acute kidney injury superimposed on chronic kidney disease ([place])    Rx / DC Orders ED Discharge Orders          Ordered    metoCLOPramide (REGLAN) 10 MG tablet  Every 8 hours        01/15/22 1249              Sherwood Gambler, MD 01/15/22 1527

## 2022-01-15 NOTE — ED Provider Triage Note (Signed)
Emergency Medicine Provider Triage Evaluation Note  John Salazar , a 76 y.o. male  was evaluated in triage.  Pt complains of hiccups x 3 days.  No chest pain or abdominal pain.  Patient advised by nurse to report to the ED for further evaluation.  Review of Systems  Positive: hiccups Negative: CP  Physical Exam  BP (!) 130/90   Pulse 82   Temp 98.8 F (37.1 C)   Resp 16   SpO2 97%  Gen:   Awake, no distress   Resp:  Normal effort  MSK:   Moves extremities without difficulty  Other:  Hiccupping during exam  Medical Decision Making  Medically screening exam initiated at 10:05 AM.  Appropriate orders placed.  John Salazar was informed that the remainder of the evaluation will be completed by another provider, this initial triage assessment does not replace that evaluation, and the importance of remaining in the ED until their evaluation is complete.  Hiccups    Suzy Bouchard, [place] 01/15/22 1006

## 2022-01-15 NOTE — ED Triage Notes (Signed)
Patient here for evaluation of hiccoughs that started three days ago. Patient is alert, oriented, and in no apparent distress at this time.

## 2022-01-15 NOTE — Discharge Instructions (Addendum)
Your kidney function is worse today than typical.  Drink plenty of fluids and increase your fluid intake over the next few days.  Follow up with your primary care doctor to get your kidney function re-checked.   For the next week or so you can increase your Protonix from 40 mg to 40 mg twice per day.  This may help with your hiccups.  You are also being prescribed Reglan to help with this.

## 2022-01-17 ENCOUNTER — Encounter: Payer: Self-pay | Admitting: Family

## 2022-01-17 NOTE — Telephone Encounter (Signed)
Patient scheduled for an ER follow-up with Romilda Garret NP tomorrow 01/18/22 at 11:00 am.

## 2022-01-18 ENCOUNTER — Encounter: Payer: Self-pay | Admitting: Nurse Practitioner

## 2022-01-18 ENCOUNTER — Ambulatory Visit (INDEPENDENT_AMBULATORY_CARE_PROVIDER_SITE_OTHER): Payer: PPO | Admitting: Nurse Practitioner

## 2022-01-18 VITALS — BP 118/64 | HR 78 | Temp 98.4°F | Resp 16 | Ht 69.0 in | Wt 175.0 lb

## 2022-01-18 DIAGNOSIS — R066 Hiccough: Secondary | ICD-10-CM | POA: Diagnosis not present

## 2022-01-18 DIAGNOSIS — N183 Chronic kidney disease, stage 3 unspecified: Secondary | ICD-10-CM

## 2022-01-18 LAB — BASIC METABOLIC PANEL
BUN: 21 mg/dL (ref 6–23)
CO2: 27 mEq/L (ref 19–32)
Calcium: 9.8 mg/dL (ref 8.4–10.5)
Chloride: 104 mEq/L (ref 96–112)
Creatinine, Ser: 1.51 mg/dL — ABNORMAL HIGH (ref 0.40–1.50)
GFR: 44.33 mL/min — ABNORMAL LOW (ref >60.00)
Glucose, Bld: 88 mg/dL (ref 70–99)
Potassium: 4.1 mEq/L (ref 3.5–5.1)
Sodium: 138 mEq/L (ref 135–145)

## 2022-01-18 MED ORDER — BACLOFEN 5 MG PO TABS: 5.0000 mg | ORAL_TABLET | Freq: Two times a day (BID) | ORAL | 0 refills | Status: DC

## 2022-01-18 NOTE — Assessment & Plan Note (Signed)
Patient had a bump in creatinine and decrease in GFR in the emergency department visit.  Pending renal functions today.  Patient has been increasing oral intake at home of fluid

## 2022-01-18 NOTE — Assessment & Plan Note (Signed)
Intractable hiccups.  Patient already on Protonix and famotidine.  They did increase Protonix in the emergency department.  They did try a round of Reglan that was ineffective.  Will try baclofen 5 mg twice daily see if this is beneficial.  If not consider using gabapentin.  Follow-up if no improvement

## 2022-01-18 NOTE — Progress Notes (Signed)
Acute Office Visit  Subjective:     Patient ID: John Salazar, male    DOB: 11-09-1945, 76 y.o.   MRN: [id number]  Chief Complaint  Patient presents with   Follow-up   Hiccups    Since Thursday     HPI Patient is in today for Hiccups  Patient was seen in the ED on 01/15/2022. Noted to have an increase in his kidney function and was offered IV fluid but he declined and stated he would increase his oral fluid intake. He was given reglan and told to increase his protonix for the hiccups. He is here today for a follow.  States that it started on Thursday. States that he called the [employer] loss adjuster. States that he was informed to go to ED States laying down makes it worse. States that he has held his nose and drank cold water. States that he has breathed into a paper bag. States that he has also done a deep breath and hold it for 10 secs and add on to it three times. Tried swallowing ice cubes. States he has tried a teaspoon of sugar and let it dosslve and then it stopped breathly.  States that he did a covid test yesterday that was negative   Estimated Creatinine Clearance: 31.1 mL/min (A) (by C-G formula based on SCr of 1.99 mg/dL (H)).    Review of Systems  Constitutional:  Negative for chills and fever.  Respiratory:  Negative for shortness of breath.   Cardiovascular:  Negative for chest pain.  Gastrointestinal:  Negative for abdominal pain, nausea and vomiting.       "+" hiccups        Objective:    BP 118/64   Pulse 78   Temp 98.4 F (36.9 C)   Resp 16   Ht '5\' 9"'$  (1.753 m)   Wt 175 lb (79.4 kg)   SpO2 98%   BMI 25.84 kg/m    Physical Exam Vitals and nursing note reviewed.  Constitutional:      Appearance: Normal appearance.  Cardiovascular:     Rate and Rhythm: Normal rate and regular rhythm.     Heart sounds: Normal heart sounds.  Pulmonary:     Effort: Pulmonary effort is normal.     Breath sounds: Normal breath sounds.  Abdominal:     General:  Bowel sounds are normal. There is no distension.     Palpations: There is no mass.     Tenderness: There is no abdominal tenderness.     Hernia: No hernia is present.  Neurological:     Mental Status: He is alert.     No results found for any visits on 01/18/22.      Assessment & Plan:   Problem List Items Addressed This Visit       Genitourinary   CKD (chronic kidney disease) stage 3, GFR 30-59 ml/min (HCC) - Primary    Patient had a bump in creatinine and decrease in GFR in the emergency department visit.  Pending renal functions today.  Patient has been increasing oral intake at home of fluid      Relevant Orders   Basic metabolic panel     Other   Intractable hiccups    Intractable hiccups.  Patient already on Protonix and famotidine.  They did increase Protonix in the emergency department.  They did try a round of Reglan that was ineffective.  Will try baclofen 5 mg twice daily see if this is beneficial.  If not consider using gabapentin.  Follow-up if no improvement      Relevant Medications   Baclofen 5 MG TABS    Meds ordered this encounter  Medications   Baclofen 5 MG TABS    Sig: Take 5 mg by mouth 2 (two) times daily.    Dispense:  15 tablet    Refill:  0    Order Specific Question:   Supervising Provider    Answer:   TOWER, MARNE A [[id number]    Return if symptoms worsen or fail to improve.  Romilda Garret, NP

## 2022-01-18 NOTE — Patient Instructions (Signed)
Nice to see you today I will be in touch with the kidney labs once I have the results We will try a muscle relaxer for the hiccups. If they do not improve let me know

## 2022-01-19 ENCOUNTER — Telehealth: Payer: Self-pay

## 2022-01-19 NOTE — Progress Notes (Addendum)
Chronic Care Management Pharmacy Assistant   Name: John Salazar  MRN: [id number] DOB: [birth year]  Reason for Encounter: CCM [facility] Follow-Up)  Medications: Outpatient Encounter Medications as of 01/19/2022  Medication Sig   acetaminophen (TYLENOL) 325 MG tablet Take 650 mg by mouth every 6 (six) hours as needed (pain).   amLODipine (NORVASC) 10 MG tablet Patient taking 1/2 tablet (5 mg total) by mouth daily   aspirin EC 81 MG tablet Take 1 tablet (81 mg total) by mouth daily. Swallow whole.   Baclofen 5 MG TABS Take 5 mg by mouth 2 (two) times daily.   clopidogrel (PLAVIX) 75 MG tablet TAKE ONE TABLET BY MOUTH DAILY   escitalopram (LEXAPRO) 10 MG tablet Take 1 tablet (10 mg total) by mouth daily.   famotidine (PEPCID) 20 MG tablet Take 20 mg by mouth daily as needed for heartburn or indigestion.   isosorbide mononitrate (IMDUR) 60 MG 24 hr tablet Take 0.5 tablets (30 mg total) by mouth daily. May take an additional one-half tablet (30 mg) by mouth as needed for chest pain   losartan (COZAAR) 50 MG tablet Take 1 tablet (50 mg total) by mouth daily.   metoCLOPramide (REGLAN) 10 MG tablet Take 1 tablet (10 mg total) by mouth every 8 (eight) hours for 10 days.   nitroGLYCERIN (NITROSTAT) 0.4 MG SL tablet Place 1 tablet (0.4 mg total) under the tongue every 5 (five) minutes as needed for chest pain.   pantoprazole (PROTONIX) 40 MG tablet Take 1 tablet (40 mg total) by mouth daily.   rosuvastatin (CRESTOR) 40 MG tablet Take one (1) tablet by mouth ( 40 mg) daily.   tamsulosin (FLOMAX) 0.4 MG CAPS capsule Take two capsules once daily (total 0.8 mg) thirty minutes after same meal daily   Turmeric 500 MG TABS Take 1 tablet by mouth as needed (for inflamation).   No facility-administered encounter medications on file as of 01/19/2022.   Reviewed hospital notes for details of recent visit. Has patient been contacted by Transitions of Care team? No Has patient seen PCP/specialist for  hospital follow up (summarize OV if yes): Yes 01/18/2022 [name], NP Start: Baclofen 5 mg Intractable hiccups.  Patient already on Protonix and famotidine.  They did increase Protonix in the emergency department.  They did try a round of Reglan that was ineffective.  Will try baclofen 5 mg twice daily see if this is beneficial.  If not consider using gabapentin.  Follow-up if no improvement  Admitted to the ED on 01/15/2022. Discharge date was 01/15/2022.  Discharged from [facility].   Discharge diagnosis (Principal Problem): Hiccups Patient was discharged to Home  Brief summary of hospital course: Patient was given oral Reglan for his hiccups.  Otherwise, he is well appearing. There is no clear cause for his hiccups. No apparent medical emergency. Will trial reglan at home, increase protonix dose.  He has noted to have an increase in his creatinine from baseline.  It is mild compared to his chronic kidney disease.  Offered him IV fluids though he has declined and wants to just increase his p.o. intake at home which I think is reasonable.  I advised him to get this rechecked. Otherwise, will discharge home with return precautions.    New Medications Started at [facility] Discharge:?? -Started metoCLOPramide (REGLAN) 10 MG tablet    Medications that remain the same after Hospital Discharge:??  -All other medications will remain the same.    Next CCM appt: 05/24/2022  Other upcoming appts: PCP appointment on 01/20/22 and 02/22/22 for B-12 Injection  [name], PharmD notified and will determine if action is needed.  [name], CPP notified  [name], [place] Clinical Pharmacy Assistant [phone]   Pharmacist addendum: Reviewed notes. Pt has seen PCP for follow up. No PharmD intervention warranted.  [name], PharmD, BCACP 02/04/22 10:16 AM

## 2022-01-20 ENCOUNTER — Ambulatory Visit (INDEPENDENT_AMBULATORY_CARE_PROVIDER_SITE_OTHER): Payer: PPO

## 2022-01-20 DIAGNOSIS — E538 Deficiency of other specified B group vitamins: Secondary | ICD-10-CM | POA: Diagnosis not present

## 2022-01-20 MED ORDER — CYANOCOBALAMIN 1000 MCG/ML IJ SOLN
1000.0000 ug | Freq: Once | INTRAMUSCULAR | Status: AC
  Administered 2022-01-20: 1000 ug via INTRAMUSCULAR

## 2022-01-20 NOTE — Progress Notes (Signed)
Per orders of  [name] NP, injection of Vitamin B 12 given by Ozzie Hoyle. Patient tolerated injection well.

## 2022-01-25 ENCOUNTER — Ambulatory Visit: Payer: PPO

## 2022-02-22 ENCOUNTER — Ambulatory Visit (INDEPENDENT_AMBULATORY_CARE_PROVIDER_SITE_OTHER): Payer: PPO

## 2022-02-22 DIAGNOSIS — E538 Deficiency of other specified B group vitamins: Secondary | ICD-10-CM

## 2022-02-22 MED ORDER — CYANOCOBALAMIN 1000 MCG/ML IJ SOLN
1000.0000 ug | Freq: Once | INTRAMUSCULAR | Status: AC
  Administered 2022-02-22: 1000 ug via INTRAMUSCULAR

## 2022-02-22 NOTE — Progress Notes (Signed)
Per orders of Eugenia Pancoast, NP, injection of B-12 given by Barkley Bruns in right deltoid. Patient tolerated injection well.

## 2022-03-06 IMAGING — US IR TRANSCATH EMBOLIZATION
1 series · 2 of 2 positions shown · non-contrast
Comparison: Diagnostic arteriogram of 04/14/2020.

CLINICAL DATA: Intermittent headaches. Discovery of a 2.7 mm x
mm irregular wide neck blister-like aneurysm in the supraclinoid
left ICA associated with adjacent dysplasia. Strong family history
of ruptured intracranial aneurysms.

EXAM:
TRANSCATHETER THERAPY EMBOLIZATION
TECHNIQUE: Informed written consent was obtained from the patient after a
thorough discussion of the procedural risks, benefits and
alternatives. All questions were addressed. Maximal Sterile Barrier
Technique was utilized including caps, mask, sterile gowns, sterile
gloves, sterile drape, hand hygiene and skin antiseptic. A timeout
was performed prior to the initiation of the procedure.
INDICATION: Recurrent headaches. Discovery of 2.7 mm x 2.5 mm blister-like wide
neck aneurysm of the supraclinoid left ICA associated with native
vessel dysplasia. Strong family history of ruptured intracranial
aneurysms.

[Series 1: ir (id) (id)/(id) · 2 of 2 slices shown]
[im 1/2]
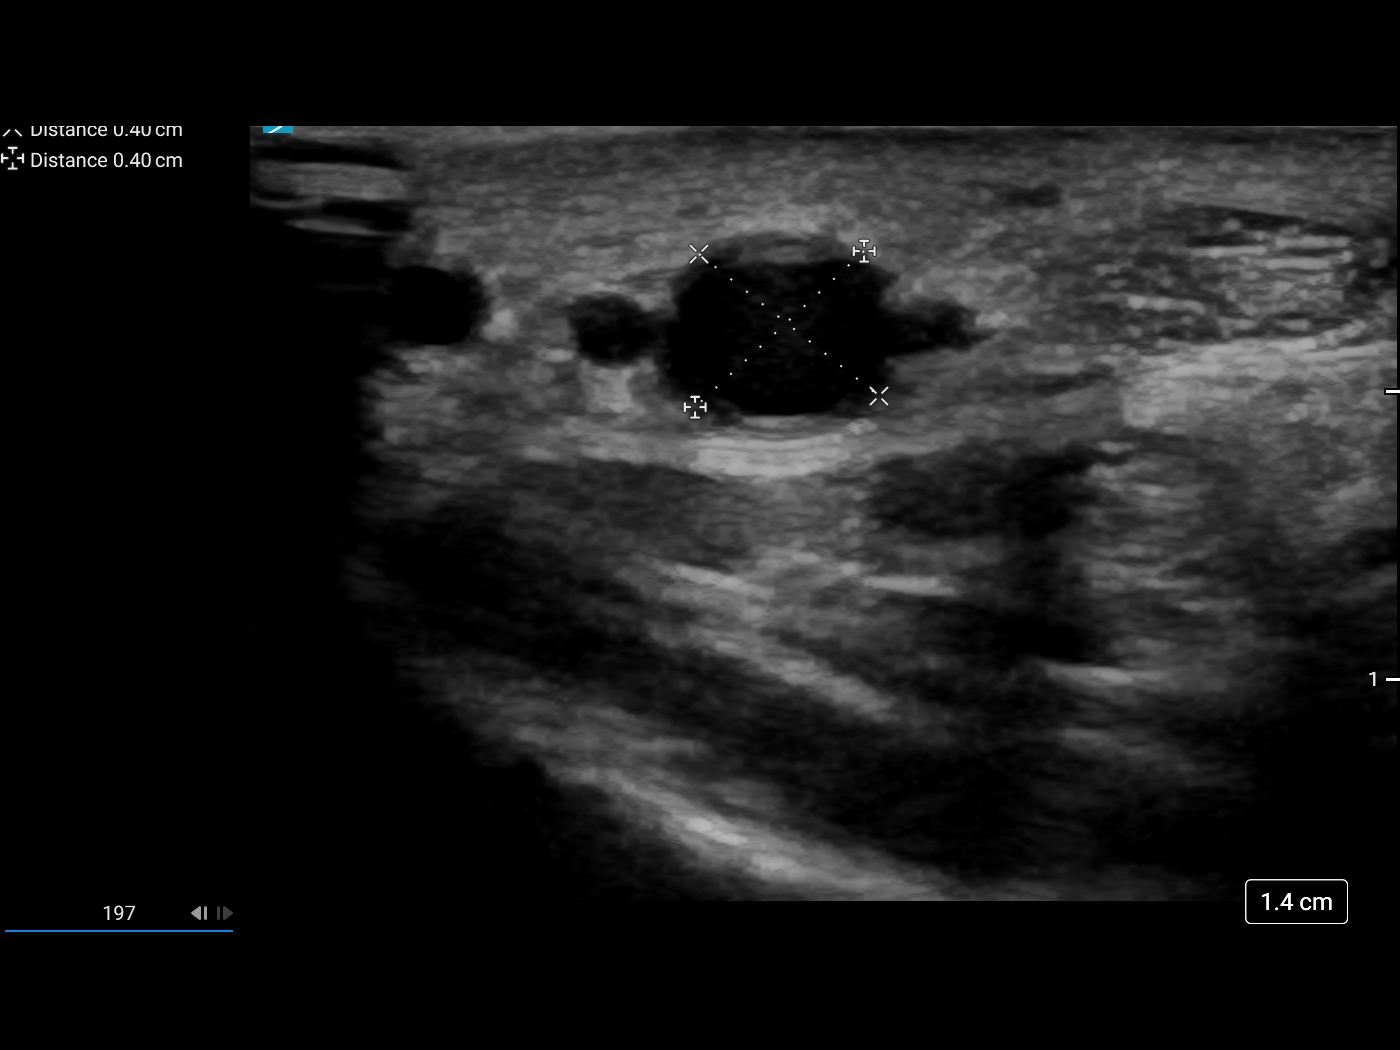
[im 2/2]
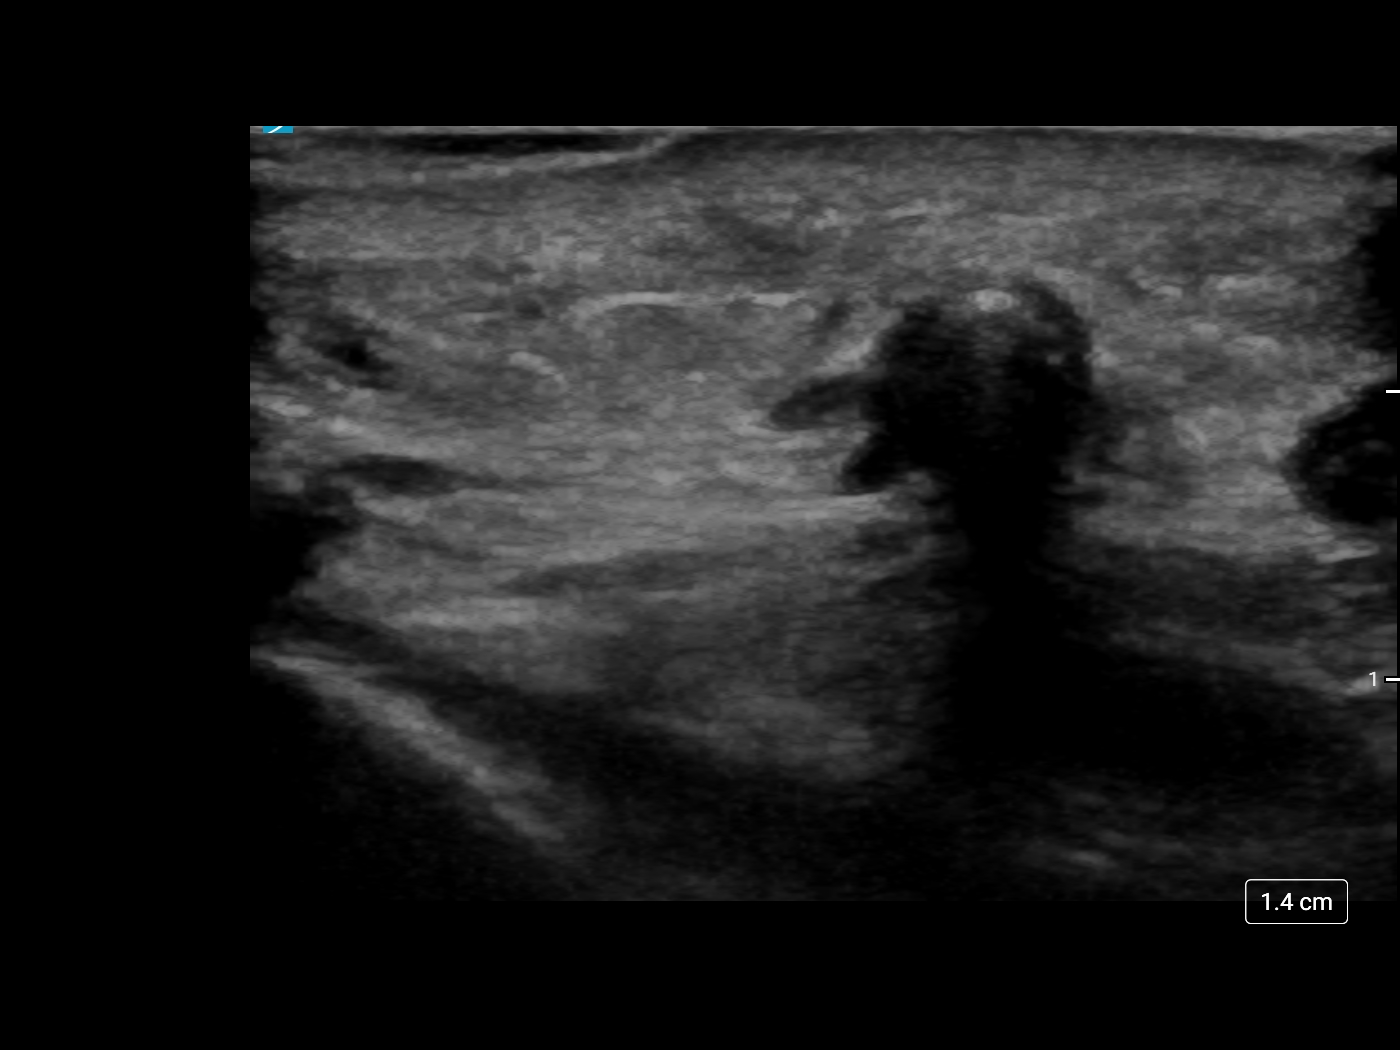

[2 of 2 positions shown; findings below may reference images not displayed]

MEDICATIONS:
Heparin 4,000 units IV; vancomycin 1 g IV antibiotic was
administered within 1 hour of the procedure.

ANESTHESIA/SEDATION:
General anesthesia.

CONTRAST:  Isovue 300 approximately 110 cc

FLUOROSCOPY TIME:  Fluoroscopy Time: 18 minutes 50 seconds (1153
mGy).

COMPLICATIONS:
None immediate.
The right forearm to the wrist was prepped and draped in the usual
sterile manner.

The right radial artery morphology was then documented with
ultrasound. A dorsal palmar anastomosis was verified to be present.
Using ultrasound guidance and micropuncture set access into the
right radial artery was obtained over a 0.018 inch micro guidewire.

A [DATE] French radial sheath was then inserted without event. The
obturator, and the wire were removed. Good aspiration obtained from
the side port of the radial sheath. A cocktail of 3777 units of
heparin, 2.5 mg of verapamil, and 200 mcg of nitroglycerin was then
infused through the sheath in diluted form without event.

A right radial arteriogram was then performed.

Over a 0.035 inch Roadrunner guidewire, a combination of a
French 125 cm Knurovszky support catheter inside of a 90 cm [REDACTED] guide
sheath was advanced to the aortic arch region. The Knurovszky catheter
was formed followed by selection and advancement into the left
common carotid artery to the bifurcation over a 0.035 inch
Roadrunner guidewire. The guidewire, and the Knurovszky 2 support
catheter were retrieved and removed with the 7 French [REDACTED] sheath
advanced to just proximal to the left common carotid bifurcation.

An arteriogram was then performed.

At the end of the procedure, the radial sheath was removed with
application of a wrist band for hemostasis at the right radial
puncture site. Distal right radial pulse was verified to be present.
FINDINGS: Left common carotid arteriogram demonstrates the left external
carotid artery and its major branches to be widely patent.

The left internal carotid artery at the bulb and just distally again
demonstrates the presence of a prominent outpouching at the bulb
with contrast stasis, and with a small irregular laterally
projecting outpouching distal to this.

Associated with approximately 50% stenosis at the origin of the left
internal carotid artery bulb.

More distally the vessel is seen to opacify to the cranial skull
base. The petrous, cavernous and supraclinoid segments again seen to
be widely patent.

Again noted is the mild caliber irregularity of the cavernous ICA
associated with dysplasia of the supraclinoid left ICA. Again noted
is the irregular blister-like aneurysm in this region unchanged.

The left middle cerebral artery and the left anterior cerebral
artery opacify into the capillary and venous phases. Focal areas of
caliber irregularity and narrowing is seen of the callosal marginal
and the pericallosal arteries most consistent with intracranial
arteriosclerosis.

ENDOVASCULAR TREATMENT OF LEFT INTERNAL CAROTID ARTERY SUPRACLINOID
2.7 MM X 2.5 MM BLISTER-LIKE ANEURYSM WITH VESSEL DYSPLASIA.

Through the 7 French [REDACTED] guide sheath in the left common carotid
artery, a combination of an 027 Phenom microcatheter inside of a 5
French 115 cm Catalyst guide catheter was advanced without
difficulty to the horizontal petrous segment of the left internal
carotid artery.

The micro guidewire was then gently manipulated with a torque device
and advanced into the M2 M3 segment of the inferior division of the
left middle cerebral artery followed by the microcatheter.

The guidewire was removed. Good aspiration obtained from the hub of
the microcatheter. A gentle control arteriogram performed through
the microcatheter demonstrated safe position of the tip of the
microcatheter which was then connected to continuous heparinized
saline infusion.

Measurements were performed of the proximal and the distal landing
zones of the flow diverter device.

It was decided to place the distal device just distal to the origin
of the left middle cerebral artery in the proximal in the straight
segment of the cavernous segment just distal to the origin of the
ophthalmic artery. A 4.5 mm x 14 mm pipeline shield flow diverter
device was selected.

This was then advanced in a coaxial manner with constant heparinized
saline infusion to the distal end of the microcatheter.

At this time the Catalyst guide catheter had been advanced into the
cavernous left ICA.

The entire system was then straightened.

The O ring on the delivery microcatheter was loosened. With slight
forward gentle traction with the right hand on the delivery micro
guidewire, with the left hand the Phenom microcatheter was gently
retrieved and then advanced again to just distal to the marker on
the distal wire. The microcatheter was retrieved unsheathing the
distal portion of the device until the cigar configuration opened
up. The entire delivery system was then retrieved to the distal
landing zone in the proximal left middle cerebral artery.

Thereafter, with the slow gentle advancement on the micro guidewire,
and intermittent gentle forward traction, on the system in order to
enable close apposition of the device with the native vessel, the
rest of the device was then deployed. The microcatheter was then
advanced through the deployed device into the left middle cerebral
artery. A control arteriogram performed through the 5 French
Catalyst guide catheter in the left internal carotid artery
demonstrated excellent apposition and opening up of the entire
device distally and proximally without evidence of an endoleak.

Under close fluoroscopic observation, the combination of the micro
guidewire, and the microcatheter was gently retrieved and removed.
Control arteriograms were then performed at 10 and 30 minutes post
deployment of the device. There continued to be excellent apposition
without evidence of intra device filling defects. More distally no
evidence of intraluminal filling defects or of occlusion was seen.

The 5 French Catalyst guide catheter was then retrieved and removed.
A control arteriogram performed through the 7 French [REDACTED] in the
left common carotid artery continued to demonstrate no change in the
left internal carotid artery proximally.

This was removed. Hemostasis was obtained at the right wrist
puncture site with a wrist band. Distal right radial pulse was
verified to be present.

Throughout the procedure, the patient's blood pressure and
neurological status remained stable.

The patient's ACT was maintained in region of approximately 220
seconds. Patient was then extubated without event. Upon recovery,
the patient denied any headaches, nausea or vomiting. Denied any
visual, motor or coordination difficulties.

She was then transferred to the PACU and then neuro ICU for close
neurological observations, and blood pressure.

The following morning, IV heparin was stopped. The patient was
switched to 325 mg of aspirin, and 75 mg of Plavix.

Neurologically the patient remained stable. The right wrist puncture
site demonstrated no bleeding or hematoma. Patient was able to eat
normally. He was ambulated with assistance and then independently.

Patient was then discharged home under the care of his wife. Patient
was advised to continue taking his dual antiplatelets in addition to
his home meds. Also patient was advised to refrain from stooping,
bending or lifting weights above 10 pounds for 2 weeks. He was asked
not to drive for at least 2 weeks.

Additionally he was instructed to drink water for hydration. Should
the patient develop any blurred vision, loss of vision, right-sided
weakness, numbness, tingling or facial droop or speech difficulties,
the patient and spouse were asked to call [phone].

They leave with good understanding and agreement with the above
management plan.
IMPRESSION: Status post endovascular treatment of 2.7 mm x 2.5 mm irregular
blister-like aneurysm in the supraclinoid left ICA with placement of
a 4.5 mm x 14 mm pipeline shield flow diverter device.

Again noted is presence of a 50% stenosis of the proximal left ICA
associated with 2 pseudo aneurysms as described earlier.

PLAN:
Follow-up in clinic 2 weeks post discharge.

## 2022-03-08 ENCOUNTER — Other Ambulatory Visit (HOSPITAL_COMMUNITY): Payer: Self-pay | Admitting: Interventional Radiology

## 2022-03-08 DIAGNOSIS — I771 Stricture of artery: Secondary | ICD-10-CM

## 2022-03-17 ENCOUNTER — Ambulatory Visit (HOSPITAL_COMMUNITY)
Admission: RE | Admit: 2022-03-17 | Discharge: 2022-03-17 | Disposition: A | Discharge status: Home / Self Care | Payer: PPO | Source: Ambulatory Visit | Attending: Interventional Radiology | Admitting: Interventional Radiology

## 2022-03-17 DIAGNOSIS — N1832 Chronic kidney disease, stage 3b: Secondary | ICD-10-CM | POA: Insufficient documentation

## 2022-03-17 DIAGNOSIS — Z95828 Presence of other vascular implants and grafts: Secondary | ICD-10-CM | POA: Diagnosis not present

## 2022-03-17 DIAGNOSIS — I771 Stricture of artery: Secondary | ICD-10-CM | POA: Diagnosis not present

## 2022-03-17 DIAGNOSIS — M47812 Spondylosis without myelopathy or radiculopathy, cervical region: Secondary | ICD-10-CM | POA: Diagnosis not present

## 2022-03-17 DIAGNOSIS — I639 Cerebral infarction, unspecified: Secondary | ICD-10-CM | POA: Diagnosis not present

## 2022-03-17 DIAGNOSIS — I672 Cerebral atherosclerosis: Secondary | ICD-10-CM | POA: Diagnosis not present

## 2022-03-17 DIAGNOSIS — I6622 Occlusion and stenosis of left posterior cerebral artery: Secondary | ICD-10-CM | POA: Diagnosis not present

## 2022-03-17 LAB — POCT I-STAT CREATININE: Creatinine, Ser: 1.8 mg/dL — ABNORMAL HIGH (ref 0.61–1.24)

## 2022-03-17 MED ORDER — IOHEXOL 350 MG/ML SOLN
100.0000 mL | Freq: Once | INTRAVENOUS | Status: AC | PRN
  Administered 2022-03-17: 60 mL via INTRAVENOUS

## 2022-03-17 MED ORDER — SODIUM CHLORIDE (PF) 0.9 % IJ SOLN
INTRAMUSCULAR | Status: AC
  Filled 2022-03-17: qty 50

## 2022-03-21 ENCOUNTER — Telehealth (HOSPITAL_COMMUNITY): Payer: Self-pay

## 2022-03-21 NOTE — Telephone Encounter (Signed)
Called pt regarding recent imaging, no answer, left vm. AB

## 2022-03-22 ENCOUNTER — Telehealth (HOSPITAL_COMMUNITY): Payer: Self-pay

## 2022-03-22 NOTE — Telephone Encounter (Signed)
Pt agreed to f/u in 6-8 months with a cta head/neck. AB

## 2022-04-04 ENCOUNTER — Other Ambulatory Visit: Payer: Self-pay | Admitting: Physician Assistant

## 2022-04-04 NOTE — Telephone Encounter (Signed)
Patient of Dr. Harrington Challenger. Please review for refill. Thank you!

## 2022-04-10 NOTE — Progress Notes (Signed)
Cardiology Office Note   Date:  04/11/2022   ID:  John Salazar, John Salazar 09-27-1945, MRN [id number]  PCP:  Eugenia Pancoast, FNP  Cardiologist:   Dorris Carnes, MD   Pt presents for f/u of HTN and CAD     History of Present Illness: John Salazar is a 77 y.o. male with a history of CAD, HTN and CV dz (s/p PTA/ stent 2019, 2022), GERD, EtOH use    In 2020 pt underwent L heart cath which is noted below  Cath showed mod dz and one tight OM lesion.  Plan was to continue medical Rx     I saw the pt in APril 2022   He was seen by APP since   The pt denies CP   Breathing is OK  No palpitations  No dizziness     He says he is active walking   BP readings at home   110s to 140s   Average 120s, 130     Current Meds  Medication Sig   amLODipine (NORVASC) 10 MG tablet Patient taking 1/2 tablet (5 mg total) by mouth daily   aspirin EC 81 MG tablet Take 1 tablet (81 mg total) by mouth daily. Swallow whole.   clopidogrel (PLAVIX) 75 MG tablet TAKE ONE TABLET BY MOUTH DAILY   escitalopram (LEXAPRO) 10 MG tablet Take 1 tablet (10 mg total) by mouth daily.   famotidine (PEPCID) 20 MG tablet Take 20 mg by mouth daily as needed for heartburn or indigestion.   isosorbide mononitrate (IMDUR) 60 MG 24 hr tablet Take 0.5 tablets (30 mg total) by mouth daily. May take an additional one-half tablet (30 mg) by mouth as needed for chest pain   losartan (COZAAR) 50 MG tablet Take 1 tablet (50 mg total) by mouth daily.   pantoprazole (PROTONIX) 40 MG tablet Take 1 tablet (40 mg total) by mouth daily.   rosuvastatin (CRESTOR) 40 MG tablet Take one (1) tablet by mouth (40 mg) daily.   tamsulosin (FLOMAX) 0.4 MG CAPS capsule Take two capsules once daily (total 0.8 mg) thirty minutes after same meal daily   Turmeric 500 MG TABS Take 1 tablet by mouth as needed (for inflamation).   [DISCONTINUED] acetaminophen (TYLENOL) 325 MG tablet Take 650 mg by mouth every 6 (six) hours as needed (pain).   [DISCONTINUED]  nitroGLYCERIN (NITROSTAT) 0.4 MG SL tablet Place 1 tablet (0.4 mg total) under the tongue every 5 (five) minutes as needed for chest pain.     Allergies:   Bee venom and Penicillins   Past Medical History:  Diagnosis Date   Anxiety    Ascending aorta dilation ([place]) 08/22/2021   Echocardiogram 04/2021: 40 mm   Barrett esophagus    BPH (benign prostatic hyperplasia)    Cerebral aneurysm    Chronic kidney disease    stage 2   Colon polyps    Coronary artery disease    mild-mod non-obstructive CAD   Erectile dysfunction    GERD (gastroesophageal reflux disease)    History of kidney stones    History of stomach ulcers    Hypercholesteremia    Hypertension    Positive TB test    in the past    Past Surgical History:  Procedure Laterality Date   APPENDECTOMY  1970   CATARACT EXTRACTION W/ INTRAOCULAR LENS  IMPLANT, BILATERAL     COLONOSCOPY  10/2012   ESOPHAGOGASTRODUODENOSCOPY ENDOSCOPY  10/2012   HERNIA REPAIR     IR 3D  INDEPENDENT WKST  04/14/2020   IR ANGIO INTRA EXTRACRAN SEL COM CAROTID INNOMINATE BILAT MOD SED  08/09/2017   IR ANGIO INTRA EXTRACRAN SEL COM CAROTID INNOMINATE BILAT MOD SED  11/08/2018   IR ANGIO INTRA EXTRACRAN SEL COM CAROTID INNOMINATE BILAT MOD SED  04/14/2020   IR ANGIO INTRA EXTRACRAN SEL COM CAROTID INNOMINATE UNI L MOD SED  06/29/2020   IR ANGIO INTRA EXTRACRAN SEL INTERNAL CAROTID UNI L MOD SED  05/20/2020   IR ANGIO VERTEBRAL SEL VERTEBRAL BILAT MOD SED  08/09/2017   IR ANGIO VERTEBRAL SEL VERTEBRAL BILAT MOD SED  11/08/2018   IR ANGIO VERTEBRAL SEL VERTEBRAL BILAT MOD SED  04/14/2020   IR ANGIOGRAM FOLLOW UP STUDY  05/20/2020   IR INTRAVSC STENT CERV CAROTID W/EMB-PROT MOD SED INCL ANGIO  08/23/2017   IR INTRAVSC STENT CERV CAROTID W/EMB-PROT MOD SED INCL ANGIO  06/29/2020   IR RADIOLOGIST EVAL & MGMT  05/05/2020   IR RADIOLOGIST EVAL & MGMT  06/03/2020   IR RADIOLOGIST EVAL & MGMT  07/15/2020   IR TRANSCATH/EMBOLIZ  05/18/2020   IR US GUIDE VASC ACCESS RIGHT   04/14/2020   IR US GUIDE VASC ACCESS RIGHT  05/20/2020   IR US GUIDE VASC ACCESS RIGHT  06/29/2020   LEFT HEART CATH AND CORONARY ANGIOGRAPHY N/A 11/15/2018   Procedure: LEFT HEART CATH AND CORONARY ANGIOGRAPHY;  Surgeon: Nelva Bush, MD;  Location: [facility];  Service: Cardiovascular;  Laterality: N/A;   PROSTATE SURGERY  1999   20 years ago, enlarged prostate   RADIOLOGY WITH ANESTHESIA N/A 08/23/2017   Procedure: IR WITH ANESTHESIA STENT PLACEMENT;  Surgeon: Luanne Bras, MD;  Location: [facility];  Service: Radiology;  Laterality: N/A;   RADIOLOGY WITH ANESTHESIA N/A 05/18/2020   Procedure: IR WITH ANESTHESIA EMBOLIZATION;  Surgeon: Luanne Bras, MD;  Location: [facility];  Service: Radiology;  Laterality: N/A;   RADIOLOGY WITH ANESTHESIA Left 06/29/2020   Procedure: RADIOLOGY WITH ANESTHESIA  LEFT CAROTID STENT PLACEMENT;  Surgeon: Luanne Bras, MD;  Location: [facility];  Service: Radiology;  Laterality: Left;     Social History:  The patient  reports that he quit smoking about 26 years ago. His smoking use included cigarettes. He has a 50.00 pack-year smoking history. He has never used smokeless tobacco. He reports that he does not drink alcohol and does not use drugs.   Family History:  The patient's family history includes Aneurysm (age of onset: 40) in his father; Stroke (age of onset: 39) in his mother.    ROS:  Please see the history of present illness. All other systems are reviewed and  Negative to the above problem except as noted.    PHYSICAL EXAM: VS:  BP 100/60   Pulse 77   Ht '5\' 9"'$  (1.753 m)   Wt 161 lb (73 kg)   SpO2 98%   BMI 23.78 kg/m   GEN:  77 yo male in no acute distress  Neck: JVP is normal   No bruits  Cardiac: RRR; No murmur  No LE edema  Respiratory:  clear to auscultation bilaterally, GI: soft, nontender  No hepatomegaly   EKG:  EKG is not ordered today Lipid Panel    Component Value Date/Time   CHOL 116 11/16/2021 0821   CHOL 124  04/19/2021 0720   TRIG 101.0 11/16/2021 0821   HDL 48.10 11/16/2021 0821   HDL 48 04/19/2021 0720   CHOLHDL 2 11/16/2021 0821   VLDL 20.2 11/16/2021 [id number]  [place] 48 11/16/2021 0821   LDLCALC 55 04/19/2021 0720    CATH:  11/15/18  Left Main  Vessel is large.  Dist LM lesion is 20% stenosed.  Left Anterior Descending  Vessel is moderate in size.  Ost LAD to Prox LAD lesion is 35% stenosed. The lesion is moderately calcified.  Prox LAD to Mid LAD lesion is 45% stenosed. The lesion is eccentric.  First Diagonal Branch  Vessel is small in size.  Left Circumflex  Vessel is moderate in size.  Ost Cx to Prox Cx lesion is 40% stenosed.  Mid Cx to Dist Cx lesion is 70% stenosed.  First Obtuse Marginal Branch  Vessel is moderate in size.  Lateral First Obtuse Marginal Branch  Vessel is small in size.  Lat 1st Mrg lesion is 80% stenosed.  Second Obtuse Marginal Branch  Vessel is small in size.  2nd Mrg lesion is 70% stenosed.  Third Obtuse Marginal Branch  Vessel is small in size.  Right Coronary Artery  Vessel is small. Anterior takeoff.  Prox RCA lesion is 40% stenosed.     Wt Readings from Last 3 Encounters:  04/11/22 161 lb (73 kg)  01/18/22 175 lb (79.4 kg)  10/26/21 173 lb 4 oz (78.6 kg)      ASSESSMENT AND PLAN:  1 CAD Cath in 2020  Distal and small vessel dz in LCx, OM Pt remains symptom free   Will continue to follow, rx risk factors  2  HTN  BP is well controlled on current regimen  Follow      3  CV dz Hx of bilateral carotid artery stents  Follows with radiology  Recent CT   Keep on AsA and Plavix   4  L ICA aneurysm   s/p embolization   4  Lipids    LDL 48  HDL 48  Trig [id number]  5  Metabolics    [id number] is 5.9   watch diet   Cut back on carbs   Follo up with me next winter  Current medicines are reviewed at length with the patient today.  The patient does not have concerns regarding medicines.  Signed, Dorris Carnes, MD  04/11/2022 10:01 PM    [facility] [address], [place], [place]  [address] Phone: [phone]; Fax: [phone]

## 2022-04-11 ENCOUNTER — Encounter: Payer: Self-pay | Admitting: Internal Medicine

## 2022-04-11 ENCOUNTER — Ambulatory Visit: Payer: PPO | Attending: Internal Medicine | Admitting: Internal Medicine

## 2022-04-11 VITALS — BP 100/60 | HR 77 | Ht 69.0 in | Wt 161.0 lb

## 2022-04-11 DIAGNOSIS — I2510 Atherosclerotic heart disease of native coronary artery without angina pectoris: Secondary | ICD-10-CM

## 2022-04-11 MED ORDER — NITROGLYCERIN 0.4 MG SL SUBL: 0.4000 mg | SUBLINGUAL_TABLET | SUBLINGUAL | 99 refills | Status: DC | PRN

## 2022-04-11 NOTE — Patient Instructions (Signed)
Medication Instructions:  No changes *If you need a refill on your cardiac medications before your next appointment, please call your pharmacy*   Lab Work: none If you have labs (blood work) drawn today and your tests are completely normal, you will receive your results only by: [place] (if you have MyChart) OR A paper copy in the mail If you have any lab test that is abnormal or we need to change your treatment, we will call you to review the results.   Testing/Procedures: none   Follow-Up: At [facility], you and your health needs are our priority.  As part of our continuing mission to provide you with exceptional heart care, we have created designated Provider Care Teams.  These Care Teams include your primary Cardiologist (physician) and Advanced Practice Providers (APPs -  Physician Assistants and Nurse Practitioners) who all work together to provide you with the care you need, when you need it.   Your next appointment:   9 month(s)  Provider:   Dorris Carnes, MD

## 2022-04-14 ENCOUNTER — Telehealth: Payer: Self-pay | Admitting: Family

## 2022-04-14 NOTE — Telephone Encounter (Signed)
Contacted Karen Kitchens to schedule their annual wellness visit. Appointment made for 05/19/2022.  [place] Direct Dial: [phone]

## 2022-04-17 IMAGING — XA IR INTRACRANIAL STENT (INCL PTA)
1 series · 11 of 24 positions shown · IV contrast (IODINE)
Comparison: Diagnostic catheter arteriogram May 18, 2020.

CLINICAL DATA: History of left cerebral hemispheric ischemic
stroke. Left proximal internal carotid artery stenosis associated
with complex pseudoaneurysm.

EXAM:
INTRACRANIAL STENT (INCL PTA)
TECHNIQUE: Informed written consent was obtained from the patient after a
thorough discussion of the procedural risks, benefits and
alternatives. All questions were addressed. Maximal Sterile Barrier
Technique was utilized including caps, mask, sterile gowns, sterile
gloves, sterile drape, hand hygiene and skin antiseptic. A timeout
was performed prior to the initiation of the procedure.

[Series 300: dr. (person_name) · 11 of 152 slices shown]
[im 7/152]
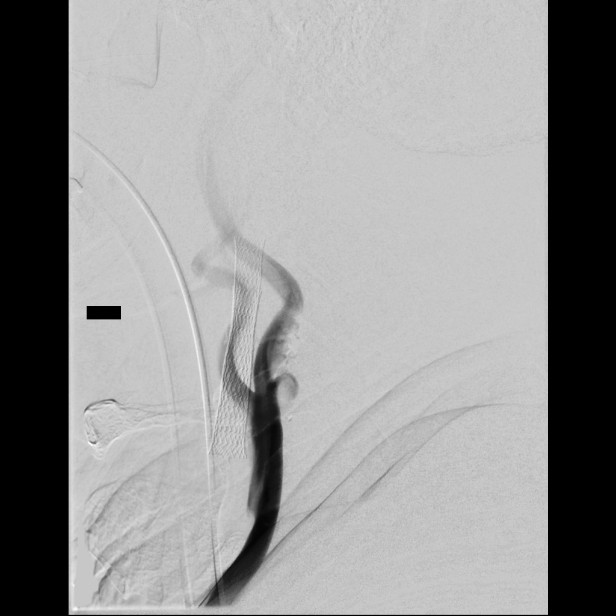
[im 20/152]
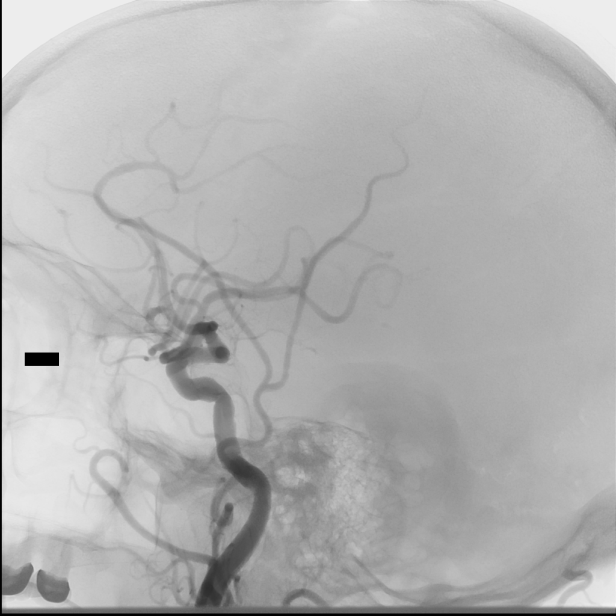
[im 33/152]
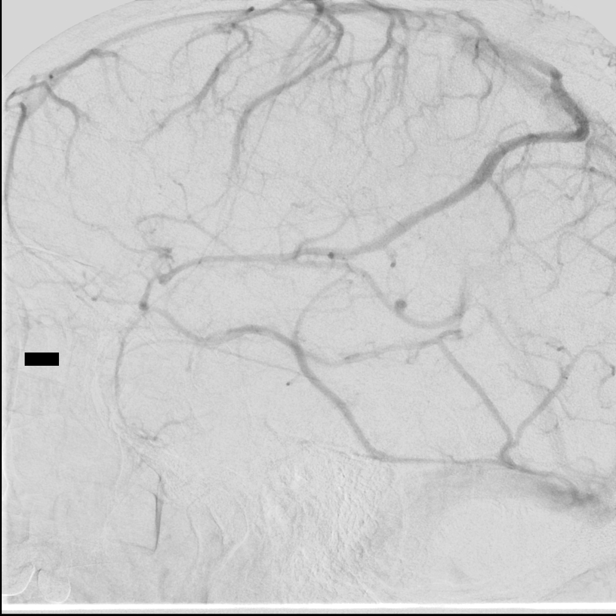
[im 46/152]
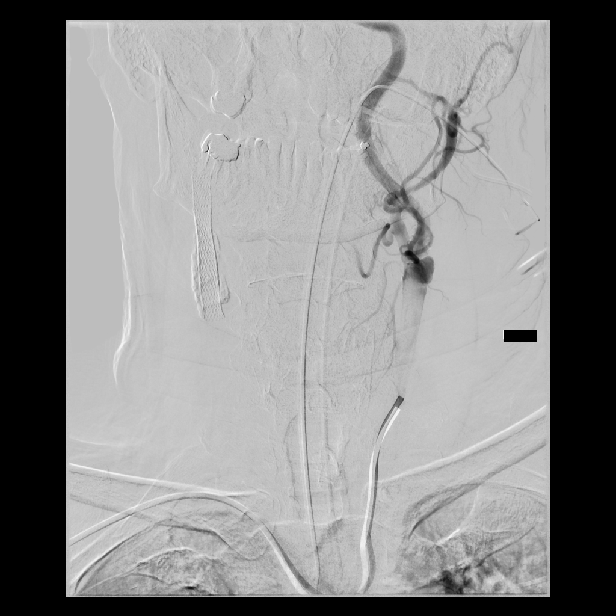
[im 60/152]
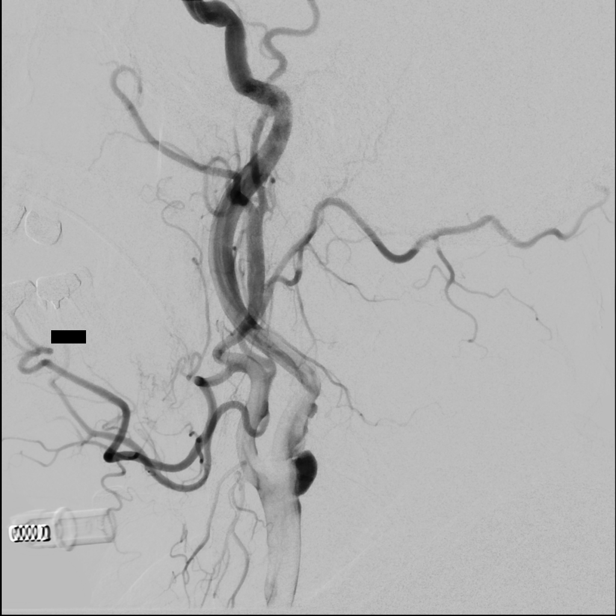
[im 79/152]
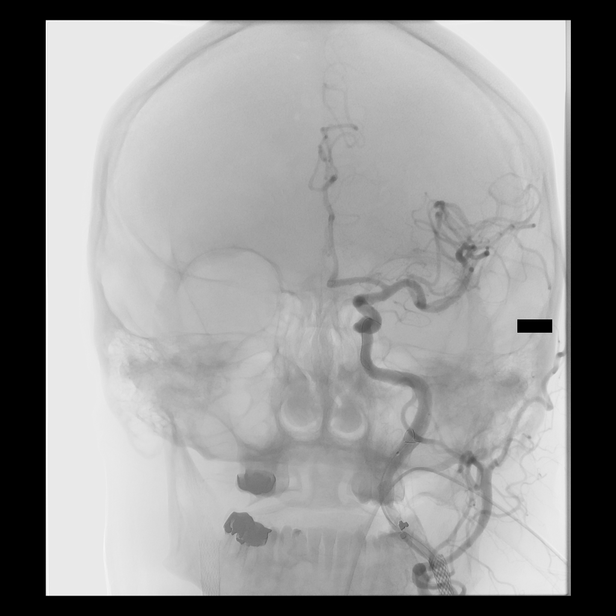
[im 92/152]
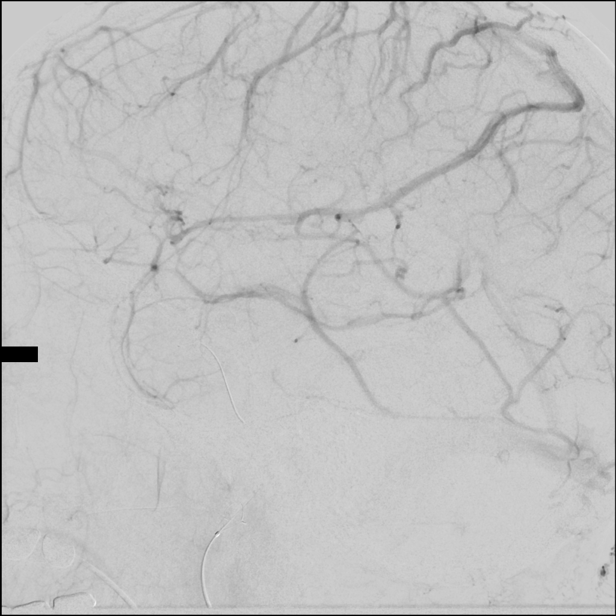
[im 106/152]
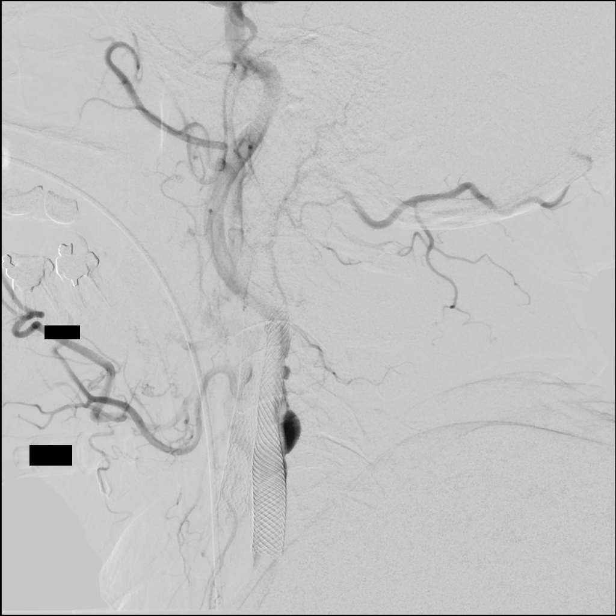
[im 119/152]
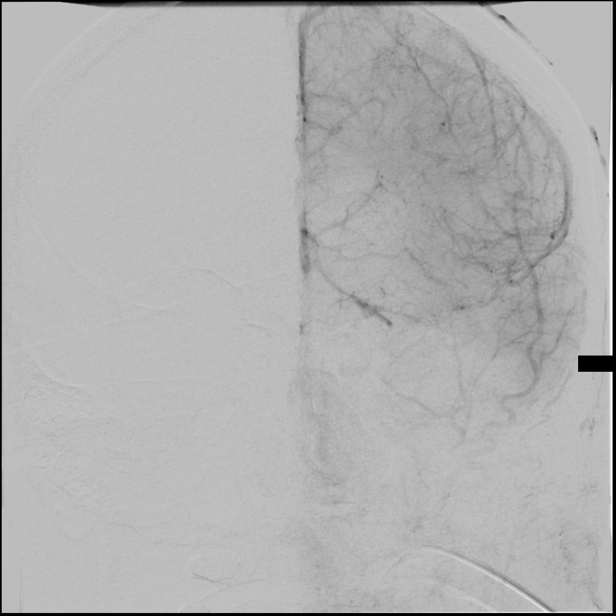
[im 132/152]
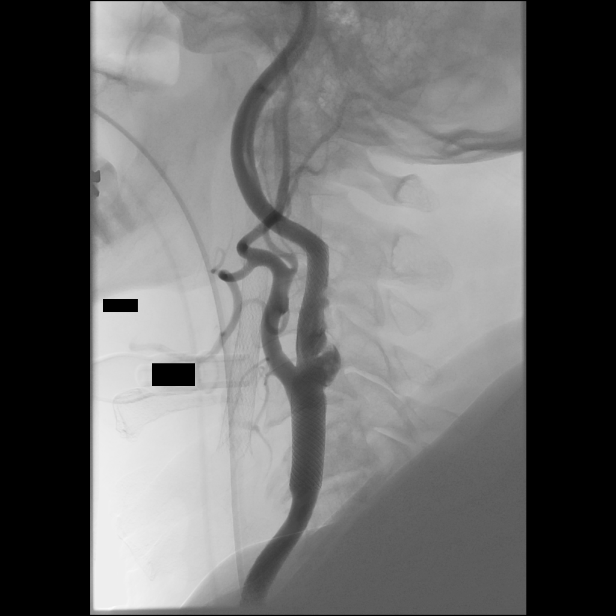
[im 145/152]
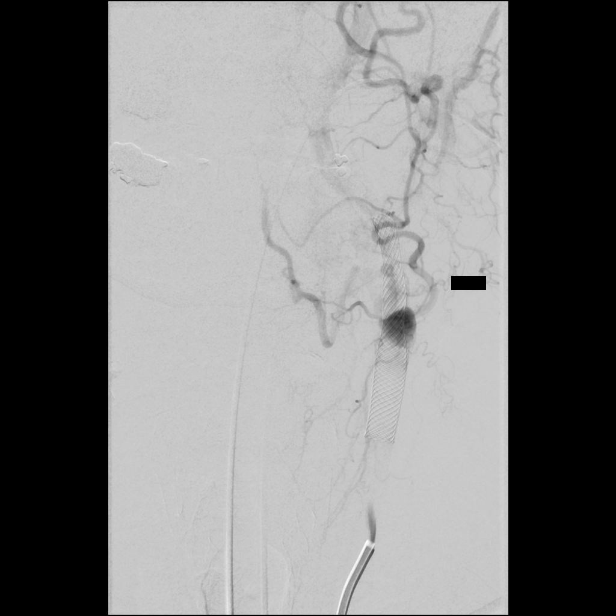

[11 of 24 positions shown; findings below may reference images not displayed]

MEDICATIONS:
Heparin 4,000 units IV. Vancomycin 1 g IV antibiotic was
administered within 1 hour of the procedure.

ANESTHESIA/SEDATION:
General anesthesia.

CONTRAST:  Omnipaque 300 approximately 80 cc

FLUOROSCOPY TIME:  Fluoroscopy Time: 15 minutes 54 seconds (610
mGy).

COMPLICATIONS:
None immediate.
The right forearm to the wrist was prepped and draped in the usual
sterile manner.

The right radial artery was then identified with ultrasound, and its
morphology documented. A dorsal palmar anastomosis was verified to
be present. Using a micropuncture set access into the right radial
artery was obtained over a 0.018 inch micro guidewire with a [DATE]
French radial sheath. The micro guidewire, and the obturator were
removed. Good aspiration obtained from the side port of the sheath.
A cocktail of 9333 units of heparin, 200 mcg of nitroglycerin, and
2.5 mg of verapamil was then infused in diluted form without event.
A radial arteriogram was obtained.

Over a 0.035 inch Roadrunner guidewire, a 90 cm 6 Edigleison Peruch 2
Envoy guide catheter was then advanced to the aortic arch region,
and engaged in the left common carotid artery.

The catheter was advanced distally over a 0.035 inch Roadrunner
guidewire.

The guidewire was removed. Good aspiration was obtained from the hub
of the 6 Edigleison Peruch 2 Envoy guide catheter.
FINDINGS: A control arteriogram performed through this demonstrated the left
external carotid artery and its major branches to be widely patent.

The left internal carotid artery at the bulb and its proximal [DATE]
again demonstrates the presence of a complex irregular plaque
associated with at least 3 pseudoaneurysms with an overlying
stenosis of approximately 50%.

More distally the left internal carotid artery is seen to opacify to
the cranial skull base. The petrous, the cavernous and the
supraclinoid segments demonstrate wide patency.

The left middle cerebral artery and the left anterior cerebral
artery continued to demonstrate opacification into the capillary and
venous phases with occasional mild caliber irregularity in the
pericallosal branches probably representing intracranial
arteriosclerosis.

Measurements were then performed of the left internal carotid artery
in its normal caliber distal to the complex plaque, and the distal
left common carotid artery.

A [DATE] French NAV Emboshield was then flushed in its housing to clear
any bubbles in the system.

The delivery microcatheter with the 014 inch micro guidewire
combination was then removed. A J configuration was then given to
the distal end of the micro guidewire.

Using biplane roadmap technique and constant fluoroscopic guidance,
in a coaxial manner and with constant heparinized saline infusion,
the combination of the filter delivery catheter with the micro
guidewire was then advanced distally to just proximal to the left
common carotid bifurcation.

Using a torque device, the wire and the filter were then advanced
more distally into the horizontal petrous segment of the left
internal carotid artery without event.

The filter was then deployed in the distal left internal carotid
artery in the usual manner.

The delivery microcatheter was removed. A control arteriogram
performed through the 6 French guide catheter demonstrated excellent
apposition without evidence of vasospasm or dissections.

An 8 mm x 36 mm Wallstent delivery system was then purged
retrogradely and antegradely with heparinized saline infusion.

Using the rapid exchange technique, the stent delivery system was
then advanced to the proximal left internal carotid artery.

The proximal and distal landing zones were then verified.

The stent was then deployed in the usual manner by holding the inner
core and retrieving the delivery catheter to deployed stent distally
and proximally.

The delivery apparatus was removed. A control arteriogram performed
through the 6 French guide catheter in the left common carotid
artery demonstrated excellent apposition distally and proximally
with stasis noted in the pseudoaneurysms.

The filter capture device was then advanced again using the rapid
exchange technique under constant fluoroscopic observation to the
proximal marker on the Emboshield. This was then captured by
retrieving the filter micro guidewire into the capture
microcatheter, and the 2 were removed under constant observation
without any entanglement with the stent.

A control arteriogram performed through the 6 French guide catheter
in the left common carotid artery intracranially demonstrated no
evidence of intraluminal filling defects or of occlusions or of the
middle cerebral artery or the left anterior cerebral artery
distributions.

Patient was also given a total of 6 mg of Integrilin to prevent
platelet aggregation in the stent itself.

Control arteriograms were then performed at 15 and 30 minutes post
stent deployment. These continued to demonstrate excellent
apposition with no evidence of intra stent irregularities, or of
intracranial occlusions or of filling defects.

A 6 Edigleison Peruch 2 guide catheter was then retrieved and removed.
A wrist band was then applied for hemostasis at the right radial
puncture site. Distal right radial pulse was verified to be present.
Patient was then extubated. Upon recovery, the patient demonstrated
no change in his neurological status motor wise. He denied any
headaches, nausea, vomiting or visual symptoms.

He was then transferred to the PACU and then neuro ICU for overnight
observations.

Overnight, the patient appeared stable neurologically and
systemically.

He was able to tolerate free liquids towards the later end of the
evening.

The following morning, the IV heparin was stopped and the patient
was switched to aspirin 81 mg a day, and Plavix 75 mg a day.

Clinically he remained asymptomatic without any neurologic
complications.

A distal right radial pulse was verified to be present. The patient
was able to tolerate p.o. intake. He was then mobilized and
discharged home under the care of his wife.

The patient was advised to refrain from stooping, bending or lifting
more than 10 pounds for a couple of weeks. He was advised to refrain
from driving also.

He was reminded to maintain adequate hydration.
IMPRESSION: Status post endovascular revascularization of a complex left
internal carotid artery proximal atherosclerotic plaque with
placement of a single 8 mm x 36 mm Wallstent.

PLAN:
Follow-up in the clinic approximately 2 weeks post discharge.

## 2022-05-08 ENCOUNTER — Encounter: Payer: Self-pay | Admitting: Family

## 2022-05-16 ENCOUNTER — Other Ambulatory Visit: Payer: PPO

## 2022-05-19 ENCOUNTER — Telehealth: Payer: Self-pay

## 2022-05-19 ENCOUNTER — Ambulatory Visit (INDEPENDENT_AMBULATORY_CARE_PROVIDER_SITE_OTHER): Payer: Medicare HMO

## 2022-05-19 VITALS — Ht 69.0 in | Wt 170.0 lb

## 2022-05-19 DIAGNOSIS — Z0000 Encounter for general adult medical examination without abnormal findings: Secondary | ICD-10-CM

## 2022-05-19 NOTE — Patient Instructions (Signed)
[name] , Thank you for taking time to come for your Medicare Wellness Visit. I appreciate your ongoing commitment to your health goals. Please review the following plan we discussed and let me know if I can assist you in the future.   These are the goals we discussed:  Goals      Exercise 3x per week (30 min per time)     Patient Stated     Treat knee pain     Patient Stated     Lose weight, and exercise more.     Track and Manage My Blood Pressure-Hypertension     Timeframe:  Long-Range Goal Priority:  Medium Start Date:     03/22/21                        Expected End Date:   03/22/22                    Follow Up Date April 2023   - check blood pressure daily - choose a place to take my blood pressure (home, clinic or office, retail store) - write blood pressure results in a log or diary    Why is this important?   You won't feel high blood pressure, but it can still hurt your blood vessels.  High blood pressure can cause heart or kidney problems. It can also cause a stroke.  Making lifestyle changes like losing a little weight or eating less salt will help.  Checking your blood pressure at home and at different times of the day can help to control blood pressure.  If the doctor prescribes medicine remember to take it the way the doctor ordered.  Call the office if you cannot afford the medicine or if there are questions about it.     Notes:         This is a list of the screening recommended for you and due dates:  Health Maintenance  Topic Date Due   DTaP/Tdap/Td vaccine (2 - Td or Tdap) 11/11/2020   COVID-19 Vaccine (4 - 2023-24 season) 10/08/2021   Flu Shot  09/08/2022   Medicare Annual Wellness Visit  05/19/2023   Colon Cancer Screening  09/12/2023   Pneumonia Vaccine  Completed   Hepatitis C Screening: USPSTF Recommendation to screen - Ages 18-79 yo.  Completed   Zoster (Shingles) Vaccine  Completed   HPV Vaccine  Aged Out    Advanced directives: Advance  directive discussed with you today. Even though you declined this today, please call our office should you change your mind, and we can give you the proper paperwork for you to fill out.   Conditions/risks identified: Aim for 30 minutes of exercise or brisk walking, 6-8 glasses of water, and 5 servings of fruits and vegetables each day.   Next appointment: Follow up in one year for your annual wellness visit. 05/23/23 @ 2:00 televisit  Preventive Care 65 Years and Older, Male  Preventive care refers to lifestyle choices and visits with your health care provider that can promote health and wellness. What does preventive care include? A yearly physical exam. This is also called an annual well check. Dental exams once or twice a year. Routine eye exams. Ask your health care provider how often you should have your eyes checked. Personal lifestyle choices, including: Daily care of your teeth and gums. Regular physical activity. Eating a healthy diet. Avoiding tobacco and drug use. Limiting alcohol use. Practicing  safe sex. Taking low doses of aspirin every day. Taking vitamin and mineral supplements as recommended by your health care provider. What happens during an annual well check? The services and screenings done by your health care provider during your annual well check will depend on your age, overall health, lifestyle risk factors, and family history of disease. Counseling  Your health care provider may ask you questions about your: Alcohol use. Tobacco use. Drug use. Emotional well-being. Home and relationship well-being. Sexual activity. Eating habits. History of falls. Memory and ability to understand (cognition). Work and work Astronomer. Screening  You may have the following tests or measurements: Height, weight, and BMI. Blood pressure. Lipid and cholesterol levels. These may be checked every 5 years, or more frequently if you are over 25 years old. Skin check. Lung  cancer screening. You may have this screening every year starting at age 59 if you have a 30-pack-year history of smoking and currently smoke or have quit within the past 15 years. Fecal occult blood test (FOBT) of the stool. You may have this test every year starting at age 25. Flexible sigmoidoscopy or colonoscopy. You may have a sigmoidoscopy every 5 years or a colonoscopy every 10 years starting at age 62. Prostate cancer screening. Recommendations will vary depending on your family history and other risks. Hepatitis C blood test. Hepatitis B blood test. Sexually transmitted disease (STD) testing. Diabetes screening. This is done by checking your blood sugar (glucose) after you have not eaten for a while (fasting). You may have this done every 1-3 years. Abdominal aortic aneurysm (AAA) screening. You may need this if you are a current or former smoker. Osteoporosis. You may be screened starting at age 59 if you are at high risk. Talk with your health care provider about your test results, treatment options, and if necessary, the need for more tests. Vaccines  Your health care provider may recommend certain vaccines, such as: Influenza vaccine. This is recommended every year. Tetanus, diphtheria, and acellular pertussis (Tdap, Td) vaccine. You may need a Td booster every 10 years. Zoster vaccine. You may need this after age 71. Pneumococcal 13-valent conjugate (PCV13) vaccine. One dose is recommended after age 13. Pneumococcal polysaccharide (PPSV23) vaccine. One dose is recommended after age 29. Talk to your health care provider about which screenings and vaccines you need and how often you need them. This information is not intended to replace advice given to you by your health care provider. Make sure you discuss any questions you have with your health care provider. Document Released: 02/20/2015 Document Revised: 10/14/2015 Document Reviewed: 11/25/2014 Elsevier Interactive Patient  Education  2017 [employer].  Fall Prevention in the Home Falls can cause injuries. They can happen to people of all ages. There are many things you can do to make your home safe and to help prevent falls. What can I do on the outside of my home? Regularly fix the edges of walkways and driveways and fix any cracks. Remove anything that might make you trip as you walk through a door, such as a raised step or threshold. Trim any bushes or trees on the path to your home. Use bright outdoor lighting. Clear any walking paths of anything that might make someone trip, such as rocks or tools. Regularly check to see if handrails are loose or broken. Make sure that both sides of any steps have handrails. Any raised decks and porches should have guardrails on the edges. Have any leaves, snow, or ice cleared  regularly. Use sand or salt on walking paths during winter. Clean up any spills in your garage right away. This includes oil or grease spills. What can I do in the bathroom? Use night lights. Install grab bars by the toilet and in the tub and shower. Do not use towel bars as grab bars. Use non-skid mats or decals in the tub or shower. If you need to sit down in the shower, use a plastic, non-slip stool. Keep the floor dry. Clean up any water that spills on the floor as soon as it happens. Remove soap buildup in the tub or shower regularly. Attach bath mats securely with double-sided non-slip rug tape. Do not have throw rugs and other things on the floor that can make you trip. What can I do in the bedroom? Use night lights. Make sure that you have a light by your bed that is easy to reach. Do not use any sheets or blankets that are too big for your bed. They should not hang down onto the floor. Have a firm chair that has side arms. You can use this for support while you get dressed. Do not have throw rugs and other things on the floor that can make you trip. What can I do in the  kitchen? Clean up any spills right away. Avoid walking on wet floors. Keep items that you use a lot in easy-to-reach places. If you need to reach something above you, use a strong step stool that has a grab bar. Keep electrical cords out of the way. Do not use floor polish or wax that makes floors slippery. If you must use wax, use non-skid floor wax. Do not have throw rugs and other things on the floor that can make you trip. What can I do with my stairs? Do not leave any items on the stairs. Make sure that there are handrails on both sides of the stairs and use them. Fix handrails that are broken or loose. Make sure that handrails are as long as the stairways. Check any carpeting to make sure that it is firmly attached to the stairs. Fix any carpet that is loose or worn. Avoid having throw rugs at the top or bottom of the stairs. If you do have throw rugs, attach them to the floor with carpet tape. Make sure that you have a light switch at the top of the stairs and the bottom of the stairs. If you do not have them, ask someone to add them for you. What else can I do to help prevent falls? Wear shoes that: Do not have high heels. Have rubber bottoms. Are comfortable and fit you well. Are closed at the toe. Do not wear sandals. If you use a stepladder: Make sure that it is fully opened. Do not climb a closed stepladder. Make sure that both sides of the stepladder are locked into place. Ask someone to hold it for you, if possible. Clearly mark and make sure that you can see: Any grab bars or handrails. First and last steps. Where the edge of each step is. Use tools that help you move around (mobility aids) if they are needed. These include: Canes. Walkers. Scooters. Crutches. Turn on the lights when you go into a dark area. Replace any light bulbs as soon as they burn out. Set up your furniture so you have a clear path. Avoid moving your furniture around. If any of your floors are  uneven, fix them. If there are any pets around  you, be aware of where they are. Review your medicines with your doctor. Some medicines can make you feel dizzy. This can increase your chance of falling. Ask your doctor what other things that you can do to help prevent falls. This information is not intended to replace advice given to you by your health care provider. Make sure you discuss any questions you have with your health care provider. Document Released: 11/20/2008 Document Revised: 07/02/2015 Document Reviewed: 02/28/2014 Elsevier Interactive Patient Education  2017 [employer]

## 2022-05-19 NOTE — Progress Notes (Signed)
I connected with  [name] on 05/19/22 by a audio enabled telemedicine application and verified that I am speaking with the correct person using two identifiers.  Patient Location: Home  Provider Location: Office/Clinic  I discussed the limitations of evaluation and management by telemedicine. The patient expressed understanding and agreed to proceed.  Subjective:   John Salazar is a 77 y.o. male who presents for Medicare Annual/Subsequent preventive examination.  Review of Systems      Cardiac Risk Factors include: advanced age (>11men, >73 women);hypertension;male gender     Objective:    Today's Vitals   05/19/22 1405  Weight: 170 lb (77.1 kg)  Height: 5\' 9"  (1.753 m)   Body mass index is 25.1 kg/m.     05/19/2022    2:16 PM 05/20/2021    8:35 AM 06/29/2020    6:57 AM 05/18/2020    6:46 AM 05/04/2020    6:29 PM 04/29/2020   10:17 AM 04/14/2020    8:13 AM  Advanced Directives  Does Patient Have a Medical Advance Directive? Yes No Yes Yes Yes Yes Yes  Type of Estate agent of [place];Living will  Healthcare Power of [place];Living will  Out of facility DNR (pink MOST or yellow form) Living will Living will  Does patient want to make changes to medical advance directive?  Yes (MAU/Ambulatory/Procedural Areas - Information given) No - Patient declined    No - Patient declined  Copy of Healthcare Power of Attorney in Chart? No - copy requested  No - copy requested        Current Medications (verified) Outpatient Encounter Medications as of 05/19/2022  Medication Sig   amLODipine (NORVASC) 10 MG tablet Patient taking 1/2 tablet (5 mg total) by mouth daily   aspirin EC 81 MG tablet Take 1 tablet (81 mg total) by mouth daily. Swallow whole.   clopidogrel (PLAVIX) 75 MG tablet TAKE ONE TABLET BY MOUTH DAILY   escitalopram (LEXAPRO) 10 MG tablet Take 1 tablet (10 mg total) by mouth daily.   famotidine (PEPCID) 20 MG tablet Take 20 mg by mouth daily as  needed for heartburn or indigestion.   isosorbide mononitrate (IMDUR) 60 MG 24 hr tablet Take 0.5 tablets (30 mg total) by mouth daily. May take an additional one-half tablet (30 mg) by mouth as needed for chest pain   losartan (COZAAR) 50 MG tablet Take 1 tablet (50 mg total) by mouth daily.   nitroGLYCERIN (NITROSTAT) 0.4 MG SL tablet Place 1 tablet (0.4 mg total) under the tongue every 5 (five) minutes as needed for chest pain.   pantoprazole (PROTONIX) 40 MG tablet Take 1 tablet (40 mg total) by mouth daily.   rosuvastatin (CRESTOR) 40 MG tablet Take one (1) tablet by mouth (40 mg) daily.   tamsulosin (FLOMAX) 0.4 MG CAPS capsule Take two capsules once daily (total 0.8 mg) thirty minutes after same meal daily   Turmeric 500 MG TABS Take 1 tablet by mouth as needed (for inflamation).   No facility-administered encounter medications on file as of 05/19/2022.    Allergies (verified) Bee venom and Penicillins   History: Past Medical History:  Diagnosis Date   Anxiety    Ascending aorta dilation 08/22/2021   Echocardiogram 04/2021: 40 mm   Barrett esophagus    BPH (benign prostatic hyperplasia)    Cerebral aneurysm    Chronic kidney disease    stage 2   Colon polyps    Coronary artery disease  mild-mod non-obstructive CAD   Erectile dysfunction    GERD (gastroesophageal reflux disease)    History of kidney stones    History of stomach ulcers    Hypercholesteremia    Hypertension    Positive TB test    in the past   Past Surgical History:  Procedure Laterality Date   APPENDECTOMY  1970   CATARACT EXTRACTION W/ INTRAOCULAR LENS  IMPLANT, BILATERAL     COLONOSCOPY  10/2012   ESOPHAGOGASTRODUODENOSCOPY ENDOSCOPY  10/2012   HERNIA REPAIR     IR 3D INDEPENDENT WKST  04/14/2020   IR ANGIO INTRA EXTRACRAN SEL COM CAROTID INNOMINATE BILAT MOD SED  08/09/2017   IR ANGIO INTRA EXTRACRAN SEL COM CAROTID INNOMINATE BILAT MOD SED  11/08/2018   IR ANGIO INTRA EXTRACRAN SEL COM CAROTID  INNOMINATE BILAT MOD SED  04/14/2020   IR ANGIO INTRA EXTRACRAN SEL COM CAROTID INNOMINATE UNI L MOD SED  06/29/2020   IR ANGIO INTRA EXTRACRAN SEL INTERNAL CAROTID UNI L MOD SED  05/20/2020   IR ANGIO VERTEBRAL SEL VERTEBRAL BILAT MOD SED  08/09/2017   IR ANGIO VERTEBRAL SEL VERTEBRAL BILAT MOD SED  11/08/2018   IR ANGIO VERTEBRAL SEL VERTEBRAL BILAT MOD SED  04/14/2020   IR ANGIOGRAM FOLLOW UP STUDY  05/20/2020   IR INTRAVSC STENT CERV CAROTID W/EMB-PROT MOD SED INCL ANGIO  08/23/2017   IR INTRAVSC STENT CERV CAROTID W/EMB-PROT MOD SED INCL ANGIO  06/29/2020   IR RADIOLOGIST EVAL & MGMT  05/05/2020   IR RADIOLOGIST EVAL & MGMT  06/03/2020   IR RADIOLOGIST EVAL & MGMT  07/15/2020   IR TRANSCATH/EMBOLIZ  05/18/2020   IR US GUIDE VASC ACCESS RIGHT  04/14/2020   IR US GUIDE VASC ACCESS RIGHT  05/20/2020   IR US GUIDE VASC ACCESS RIGHT  06/29/2020   LEFT HEART CATH AND CORONARY ANGIOGRAPHY N/A 11/15/2018   Procedure: LEFT HEART CATH AND CORONARY ANGIOGRAPHY;  Surgeon: Yvonne Kendall, MD;  Location: [facility];  Service: Cardiovascular;  Laterality: N/A;   PROSTATE SURGERY  1999   20 years ago, enlarged prostate   RADIOLOGY WITH ANESTHESIA N/A 08/23/2017   Procedure: IR WITH ANESTHESIA STENT PLACEMENT;  Surgeon: Julieanne Cotton, MD;  Location: [facility] OR;  Service: Radiology;  Laterality: N/A;   RADIOLOGY WITH ANESTHESIA N/A 05/18/2020   Procedure: IR WITH ANESTHESIA EMBOLIZATION;  Surgeon: Julieanne Cotton, MD;  Location: [facility] OR;  Service: Radiology;  Laterality: N/A;   RADIOLOGY WITH ANESTHESIA Left 06/29/2020   Procedure: RADIOLOGY WITH ANESTHESIA  LEFT CAROTID STENT PLACEMENT;  Surgeon: Julieanne Cotton, MD;  Location: [facility] OR;  Service: Radiology;  Laterality: Left;   Family History  Problem Relation Age of Onset   Stroke Mother 42   Aneurysm Father 33   Social History   Socioeconomic History   Marital status: Married    Spouse name: pamela   Number of children: 2   Years of education: 12    Highest education level: Not on file  Occupational History   Occupation: Comptroller.  Tobacco Use   Smoking status: Former    Packs/day: 2.00    Years: 25.00    Additional pack years: 0.00    Total pack years: 50.00    Types: Cigarettes    Quit date: 03/10/1996    Years since quitting: 26.2   Smokeless tobacco: Never  Vaping Use   Vaping Use: Never used  Substance and Sexual Activity   Alcohol use: No    Alcohol/week:  0.0 standard drinks of alcohol    Comment: in the past drank, quit 1993   Drug use: No   Sexual activity: Not Currently  Other Topics Concern   Not on file  Social History Narrative   Lives with Rinaldo Cloud (Wife) - together for 28 years   Children - French Ana and Raynelle Fanning -- one is a Engineer, civil (consulting) in [employer] and other works for [employer] - 17, lives in [place]   Enjoys - playing golf, goes to [employer] and sponsors people as well, church   Support - family, good friends in [place]   Exercise - walks the dog, runs up hills   Diet - improved from before, tries to drink water - low salt or no salt      Retail buyer for [employer]    Social Determinants of Health   Financial Resource Strain: Low Risk  (05/19/2022)   Overall Financial Resource Strain (CARDIA)    Difficulty of Paying Living Expenses: Not hard at all  Food Insecurity: No Food Insecurity (05/19/2022)   Hunger Vital Sign    Worried About Running Out of Food in the Last Year: Never true    Ran Out of Food in the Last Year: Never true  Transportation Needs: No Transportation Needs (05/19/2022)   PRAPARE - Administrator, Civil Service (Medical): No    Lack of Transportation (Non-Medical): No  Physical Activity: Sufficiently Active (05/19/2022)   Exercise Vital Sign    Days of Exercise per Week: 5 days    Minutes of Exercise per Session: 30 min  Stress: No Stress Concern Present (05/19/2022)   [employer] of Occupational Health - Occupational Stress Questionnaire    Feeling  of Stress : Not at all  Social Connections: Socially Integrated (05/19/2022)   Social Connection and Isolation Panel [NHANES]    Frequency of Communication with Friends and Family: More than three times a week    Frequency of Social Gatherings with Friends and Family: More than three times a week    Attends Religious Services: More than 4 times per year    Active Member of [employer] or Organizations: Yes    Attends Engineer, structural: More than 4 times per year    Marital Status: Married    Tobacco Counseling Counseling given: Not Answered   Clinical Intake:  Pre-visit preparation completed: Yes  Pain : No/denies pain     Nutritional Risks: None Diabetes: No  How often do you need to have someone help you when you read instructions, pamphlets, or other written materials from your doctor or pharmacy?: 1 - Never  Diabetic?no  Interpreter Needed?: No  Information entered by :: C.Davone Shinault LPN   Activities of Daily Living    05/19/2022    2:17 PM 05/20/2021    8:39 AM  In your present state of health, do you have any difficulty performing the following activities:  Hearing? 0 1  Comment  hearing aids  Vision? 0 0  Difficulty concentrating or making decisions? 0 0  Walking or climbing stairs? 0 0  Dressing or bathing? 0 0  Doing errands, shopping? 0 0  Preparing Food and eating ? N N  Using the Toilet? N N  In the past six months, have you accidently leaked urine? N N  Do you have problems with loss of bowel control? N N  Managing your Medications? N N  Managing your Finances? N N  Housekeeping or managing your Housekeeping?  N N    Patient Care Team: Mort Sawyers, FNP as PCP - General (Family Medicine) Pricilla Riffle, MD as PCP - Cardiology (Cardiology) August Saucer Corrie Mckusick, MD as Consulting Physician (Orthopedic Surgery) Julieanne Cotton, MD as Consulting Physician (Interventional Radiology) Vida Rigger, MD as Consulting Physician  (Gastroenterology) Kathyrn Sheriff, [facility] as Pharmacist (Pharmacist) Pricilla Riffle, MD as Consulting Physician (Cardiology) Jenel Lucks, MD as Consulting Physician (Gastroenterology)  Indicate any recent Medical Services you may have received from other than [facility] providers in the past year (date may be approximate).     Assessment:   This is a routine wellness examination for [place].  Hearing/Vision screen Hearing Screening - Comments:: aids Vision Screening - Comments:: Glasses - Unknown provider  Dietary issues and exercise activities discussed: Current Exercise Habits: The patient does not participate in regular exercise at present;Home exercise routine, Type of exercise: walking, Time (Minutes): 30, Frequency (Times/Week): 5, Weekly Exercise (Minutes/Week): 150, Intensity: Mild, Exercise limited by: None identified   Goals Addressed             This Visit's Progress    Patient Stated       Lose weight, and exercise more.       Depression Screen    05/19/2022    2:14 PM 09/24/2021    1:03 PM 05/20/2021    9:24 AM 04/29/2020   10:40 AM 04/29/2020   10:19 AM 02/27/2019    8:06 AM 01/14/2019    8:10 AM  PHQ 2/9 Scores  PHQ - 2 Score 0 0 1 0 0 1 2  PHQ- 9 Score  3 5   2 7     Fall Risk    05/19/2022    2:09 PM 05/20/2021    8:17 AM 04/29/2020    9:59 AM 09/03/2019    2:17 PM  Fall Risk   Falls in the past year? 0 0 0 0  Comment    Emmi Telephone Survey: data to providers prior to load  Number falls in past yr: 0 0 0   Injury with Fall? 0     Risk for fall due to : No Fall Risks     Follow up Falls prevention discussed;Falls evaluation completed       FALL RISK PREVENTION PERTAINING TO THE HOME:  Any stairs in or around the home? Yes  If so, are there any without handrails? No  Home free of loose throw rugs in walkways, pet beds, electrical cords, etc? Yes  Adequate lighting in your home to reduce risk of falls? Yes   ASSISTIVE DEVICES UTILIZED TO  PREVENT FALLS:  Life alert? No  Use of a cane, walker or w/c? No  Grab bars in the bathroom? No  Shower chair or bench in shower? Yes  Elevated toilet seat or a handicapped toilet? Yes    Cognitive Function:        05/19/2022    2:19 PM  6CIT Screen  What Year? 0 points  What month? 0 points  What time? 0 points  Count back from 20 0 points  Months in reverse 0 points  Repeat phrase 0 points  Total Score 0 points    Immunizations Immunization History  Administered Date(s) Administered   Fluad Quad(high Dose 65+) 10/04/2018, 12/07/2019, 11/23/2020   Influenza, High Dose Seasonal PF 11/07/2017   PFIZER(Purple Top)SARS-COV-2 Vaccination 03/16/2019, 04/08/2019, 08/18/2020   Pneumococcal Conjugate-13 07/26/2013   Pneumococcal Polysaccharide-23 11/19/2009   Pneumococcal-Unspecified 03/24/2017   Tdap 11/12/2010  Zoster Recombinat (Shingrix) 09/27/2018, 12/27/2018    TDAP status: Up to date  Flu Vaccine status: Up to date  Pneumococcal vaccine status: Up to date  Covid-19 vaccine status: Information provided on how to obtain vaccines.   Qualifies for Shingles Vaccine? Yes   Zostavax completed No   Shingrix Completed?: Yes  Screening Tests Health Maintenance  Topic Date Due   DTaP/Tdap/Td (2 - Td or Tdap) 11/11/2020   COVID-19 Vaccine (4 - 2023-24 season) 10/08/2021   INFLUENZA VACCINE  09/08/2022   Medicare Annual Wellness (AWV)  05/19/2023   COLONOSCOPY (Pts 45-6633yrs Insurance coverage will need to be confirmed)  09/12/2023   Pneumonia Vaccine 6765+ Years old  Completed   Hepatitis C Screening  Completed   Zoster Vaccines- Shingrix  Completed   HPV VACCINES  Aged Out    Health Maintenance  Health Maintenance Due  Topic Date Due   DTaP/Tdap/Td (2 - Td or Tdap) 11/11/2020   COVID-19 Vaccine (4 - 2023-24 season) 10/08/2021    Colorectal cancer screening: Type of screening: Cologuard. Completed 09/12/2018. Repeat every 5 years  Lung Cancer Screening: (Low  Dose CT Chest recommended if Age 66-80 years, 30 pack-year currently smoking OR have quit w/in 15years.) does not qualify.   Lung Cancer Screening Referral: non  Additional Screening:  Hepatitis C Screening: does qualify; Completed 02/05/18  Vision Screening: Recommended annual ophthalmology exams for early detection of glaucoma and other disorders of the eye. Is the patient up to date with their annual eye exam?  Yes  Who is the provider or what is the name of the office in which the patient attends annual eye exams? Unknown provider If pt is not established with a provider, would they like to be referred to a provider to establish care? No .   Dental Screening: Recommended annual dental exams for proper oral hygiene  Community Resource Referral / Chronic Care Management: CRR required this visit?  No   CCM required this visit?  No      Plan:     I have personally reviewed and noted the following in the patient's chart:   Medical and social history Use of alcohol, tobacco or illicit drugs  Current medications and supplements including opioid prescriptions. Patient is not currently taking opioid prescriptions. Functional ability and status Nutritional status Physical activity Advanced directives List of other physicians Hospitalizations, surgeries, and ER visits in previous 12 months Vitals Screenings to include cognitive, depression, and falls Referrals and appointments  In addition, I have reviewed and discussed with patient certain preventive protocols, quality metrics, and best practice recommendations. A written personalized care plan for preventive services as well as general preventive health recommendations were provided to patient.     Maryan PulsChristan M Naveyah Iacovelli, LPN   2/13/08654/12/2022   Nurse Notes: none

## 2022-05-19 NOTE — Progress Notes (Signed)
Error

## 2022-05-21 DIAGNOSIS — R69 Illness, unspecified: Secondary | ICD-10-CM | POA: Diagnosis not present

## 2022-05-23 ENCOUNTER — Ambulatory Visit (INDEPENDENT_AMBULATORY_CARE_PROVIDER_SITE_OTHER): Payer: Medicare HMO | Admitting: Family

## 2022-05-23 ENCOUNTER — Encounter: Payer: Self-pay | Admitting: Family

## 2022-05-23 VITALS — BP 122/74 | HR 74 | Temp 97.9°F | Ht 69.0 in | Wt 172.8 lb

## 2022-05-23 DIAGNOSIS — D649 Anemia, unspecified: Secondary | ICD-10-CM

## 2022-05-23 DIAGNOSIS — E538 Deficiency of other specified B group vitamins: Secondary | ICD-10-CM | POA: Insufficient documentation

## 2022-05-23 DIAGNOSIS — R7303 Prediabetes: Secondary | ICD-10-CM

## 2022-05-23 DIAGNOSIS — R5383 Other fatigue: Secondary | ICD-10-CM

## 2022-05-23 DIAGNOSIS — I10 Essential (primary) hypertension: Secondary | ICD-10-CM

## 2022-05-23 DIAGNOSIS — Z0001 Encounter for general adult medical examination with abnormal findings: Secondary | ICD-10-CM

## 2022-05-23 DIAGNOSIS — R0683 Snoring: Secondary | ICD-10-CM

## 2022-05-23 DIAGNOSIS — Z0000 Encounter for general adult medical examination without abnormal findings: Secondary | ICD-10-CM | POA: Diagnosis not present

## 2022-05-23 LAB — CBC WITH DIFFERENTIAL/PLATELET
Basophils Absolute: 0 10*3/uL (ref 0.0–0.1)
Basophils Relative: 0.4 % (ref 0.0–3.0)
Eosinophils Absolute: 0.2 10*3/uL (ref 0.0–0.7)
Eosinophils Relative: 3.5 % (ref 0.0–5.0)
HCT: 41 % (ref 39.0–52.0)
Hemoglobin: 13.7 g/dL (ref 13.0–17.0)
Lymphocytes Relative: 23.9 % (ref 12.0–46.0)
Lymphs Abs: 1.1 10*3/uL (ref 0.7–4.0)
MCHC: 33.4 g/dL (ref 30.0–36.0)
MCV: 89.5 fl (ref 78.0–100.0)
Monocytes Absolute: 0.6 10*3/uL (ref 0.1–1.0)
Monocytes Relative: 13.3 % — ABNORMAL HIGH (ref 3.0–12.0)
Neutro Abs: 2.8 10*3/uL (ref 1.4–7.7)
Neutrophils Relative %: 58.9 % (ref 43.0–77.0)
Platelets: 171 10*3/uL (ref 150.0–400.0)
RBC: 4.59 Mil/uL (ref 4.22–5.81)
RDW: 14 % (ref 11.5–15.5)
WBC: 4.8 10*3/uL (ref 4.0–10.5)

## 2022-05-23 LAB — MICROALBUMIN / CREATININE URINE RATIO
Creatinine,U: 134 mg/dL
Microalb Creat Ratio: 1.7 mg/g (ref 0.0–30.0)
Microalb, Ur: 2.2 mg/dL — ABNORMAL HIGH (ref 0.0–1.9)

## 2022-05-23 LAB — BASIC METABOLIC PANEL
BUN: 21 mg/dL (ref 6–23)
CO2: 27 mEq/L (ref 19–32)
Calcium: 9.4 mg/dL (ref 8.4–10.5)
Chloride: 105 mEq/L (ref 96–112)
Creatinine, Ser: 1.59 mg/dL — ABNORMAL HIGH (ref 0.40–1.50)
GFR: 41.57 mL/min — ABNORMAL LOW (ref >60.00)
Glucose, Bld: 89 mg/dL (ref 70–99)
Potassium: 4.9 mEq/L (ref 3.5–5.1)
Sodium: 139 mEq/L (ref 135–145)

## 2022-05-23 LAB — IBC + FERRITIN
Ferritin: 32.8 ng/mL (ref 22.0–322.0)
Iron: 82 ug/dL (ref 42–165)
Saturation Ratios: 23.8 % (ref 20.0–50.0)
TIBC: 344.4 ug/dL (ref 250.0–450.0)
Transferrin: 246 mg/dL (ref 212.0–360.0)

## 2022-05-23 LAB — TSH: TSH: 1.44 u[IU]/mL (ref 0.35–5.50)

## 2022-05-23 LAB — VITAMIN B12: Vitamin B-12: 494 pg/mL (ref 211–911)

## 2022-05-23 LAB — HEMOGLOBIN A1C: Hgb A1c MFr Bld: 5.9 % (ref 4.6–6.5)

## 2022-05-23 NOTE — Progress Notes (Signed)
Subjective:  Patient ID: John Salazar, male    DOB: 03-15-1945  Age: 77 y.o. MRN: [id number]  Patient Care Team: Mort Sawyers, FNP as PCP - General (Family Medicine) Pricilla Riffle, MD as PCP - Cardiology (Cardiology) August Saucer Corrie Mckusick, MD as Consulting Physician (Orthopedic Surgery) Julieanne Cotton, MD as Consulting Physician (Interventional Radiology) Vida Rigger, MD as Consulting Physician (Gastroenterology) Kathyrn Sheriff, [facility] as Pharmacist (Pharmacist) Pricilla Riffle, MD as Consulting Physician (Cardiology) Jenel Lucks, MD as Consulting Physician (Gastroenterology)   CC:  Chief Complaint  Patient presents with   Annual Exam    HPI YOSGART PAVEY is a 77 y.o. male who presents today for an annual physical exam. He reports consuming a general diet.  Walks dog daily and out in yard often, goal is to do more  He generally feels well. He reports sleeping fairly well. He does have additional problems to discuss today.   Vision:Within last year Dental:Receives regular dental care STD:The patient denies history of sexually transmitted disease.  He does report sleep at times is hard because he will wake up 1-4 times a night to pee. He is on tamsulosin 0.4 mg   CKD: kidneys stable.   Low b12: taking otc supplementation.   Concerned with feeling so tired especially with a more intense task. He doesn't get sob or doe but does feel more fatigued. He does report snoring at night per his wife.   Lung Cancer Screening with low-dose Chest CT: n/a, quit 1998.  - Adults age 24-80 who are current cigarette smokers or quit within the last 15 years. Must have 20 pack year history.  AAA Screening: >75 does have h/o smoking.  - Men age 63-75 who have ever smoked  H/o smoking, quit 1998.  Total years of smoking, about 15 years.    Colonoscopy: up to date, due again next year.    Pt is without acute concerns.   Advanced Directives Patient does have advanced  directives including DNR, living will, and healthcare power of attorney. He does not have a copy in the electronic medical record.   DEPRESSION SCREENING    05/23/2022    8:19 AM 05/19/2022    2:14 PM 09/24/2021    1:03 PM 05/20/2021    9:24 AM 04/29/2020   10:40 AM 04/29/2020   10:19 AM 02/27/2019    8:06 AM  PHQ 2/9 Scores  PHQ - 2 Score 0 0 0 1 0 0 1  PHQ- 9 Score ROS: Negative unless specifically indicated above in HPI.    Current Outpatient Medications:    amLODipine (NORVASC) 10 MG tablet, Patient taking 1/2 tablet (5 mg total) by mouth daily, Disp: , Rfl:    aspirin EC 81 MG tablet, Take 1 tablet (81 mg total) by mouth daily. Swallow whole., Disp: 90 tablet, Rfl: 3   clopidogrel (PLAVIX) 75 MG tablet, TAKE ONE TABLET BY MOUTH DAILY, Disp: 90 tablet, Rfl: 3   escitalopram (LEXAPRO) 10 MG tablet, Take 1 tablet (10 mg total) by mouth daily., Disp: 90 tablet, Rfl: 1   famotidine (PEPCID) 20 MG tablet, Take 20 mg by mouth daily as needed for heartburn or indigestion., Disp: , Rfl:    isosorbide mononitrate (IMDUR) 60 MG 24 hr tablet, Take 0.5 tablets (30 mg total) by mouth daily. May take an additional one-half tablet (30 mg) by mouth as needed for chest pain, Disp: 90 tablet, Rfl:  3   losartan (COZAAR) 50 MG tablet, Take 1 tablet (50 mg total) by mouth daily., Disp: 90 tablet, Rfl: 3   nitroGLYCERIN (NITROSTAT) 0.4 MG SL tablet, Place 1 tablet (0.4 mg total) under the tongue every 5 (five) minutes as needed for chest pain., Disp: 25 tablet, Rfl: prn   pantoprazole (PROTONIX) 40 MG tablet, Take 1 tablet (40 mg total) by mouth daily., Disp: 90 tablet, Rfl: 1   rosuvastatin (CRESTOR) 40 MG tablet, Take one (1) tablet by mouth (40 mg) daily., Disp: 90 tablet, Rfl: 2   tamsulosin (FLOMAX) 0.4 MG CAPS capsule, Take two capsules once daily (total 0.8 mg) thirty minutes after same meal daily, Disp: 60 capsule, Rfl: 2   Turmeric 500 MG TABS, Take 1 tablet by mouth as needed (for  inflamation)., Disp: , Rfl:     Objective:    BP 122/74   Pulse 74   Temp 97.9 F (36.6 C) (Temporal)   Ht 5\' 9"  (1.753 m)   Wt 172 lb 12.8 oz (78.4 kg)   SpO2 99%   BMI 25.52 kg/m   BP Readings from Last 3 Encounters:  05/23/22 122/74  04/11/22 100/60  01/18/22 118/64      Physical Exam Vitals reviewed.  Constitutional:      General: He is not in acute distress.    Appearance: Normal appearance. He is normal weight. He is not ill-appearing, toxic-appearing or diaphoretic.  HENT:     Head: Normocephalic.     Right Ear: Tympanic membrane normal.     Left Ear: Tympanic membrane normal.     Nose: Nose normal.     Mouth/Throat:     Mouth: Mucous membranes are moist.  Eyes:     Pupils: Pupils are equal, round, and reactive to light.  Cardiovascular:     Rate and Rhythm: Normal rate and regular rhythm.  Pulmonary:     Effort: Pulmonary effort is normal.     Breath sounds: Normal breath sounds.  Abdominal:     General: Abdomen is flat.     Tenderness: There is no abdominal tenderness.  Musculoskeletal:        General: Normal range of motion.  Skin:    General: Skin is warm.  Neurological:     General: No focal deficit present.     Mental Status: He is alert and oriented to person, place, and time. Mental status is at baseline.  Psychiatric:        Mood and Affect: Mood normal.        Behavior: Behavior normal.        Thought Content: Thought content normal.        Judgment: Judgment normal.          Assessment & Plan:  Snoring Assessment & Plan: Increasing fatigue.  Ruling out reasons with labs , if negative, consider sleep study to r/o sleep apnea   Vitamin B12 deficiency Assessment & Plan: B12 ordered pending results  Orders: -     Vitamin B12  Prediabetes Assessment & Plan: Pt advised of the following: Work on a diabetic diet, try to incorporate exercise at least 20-30 a day for 3 days a week or more.    Orders: -     Hemoglobin  A1c  Other fatigue -     Basic metabolic panel -     TSH  Anemia, unspecified type -     CBC with Differential/Platelet -     IBC + Ferritin  Benign  essential HTN Assessment & Plan: stable  Orders: -     Microalbumin / creatinine urine ratio  Encounter for general adult medical examination with abnormal findings Assessment & Plan: Patient Counseling(The following topics were reviewed):  Preventative care handout given to pt  Health maintenance and immunizations reviewed. Please refer to Health maintenance section. Pt advised on safe sex, wearing seatbelts in car, and proper nutrition labwork ordered today for annual Dental health: Discussed importance of regular tooth brushing, flossing, and dental visits.        Follow-up: Return in about 1 year (around 05/23/2023) for f/u CPE.   Mort Sawyers, FNP

## 2022-05-23 NOTE — Assessment & Plan Note (Signed)
Patient Counseling(The following topics were reviewed): ? Preventative care handout given to pt  ?Health maintenance and immunizations reviewed. Please refer to Health maintenance section. ?Pt advised on safe sex, wearing seatbelts in car, and proper nutrition ?labwork ordered today for annual ?Dental health: Discussed importance of regular tooth brushing, flossing, and dental visits. ? ? ?

## 2022-05-23 NOTE — Patient Instructions (Signed)
  Recommend you get your tetanus vaccination.

## 2022-05-23 NOTE — Assessment & Plan Note (Signed)
Increasing fatigue.  Ruling out reasons with labs , if negative, consider sleep study to r/o sleep apnea

## 2022-05-23 NOTE — Assessment & Plan Note (Signed)
B12 ordered pending results °

## 2022-05-23 NOTE — Assessment & Plan Note (Signed)
Pt advised of the following: Work on a diabetic diet, try to incorporate exercise at least 20-30 a day for 3 days a week or more.   

## 2022-05-23 NOTE — Assessment & Plan Note (Signed)
stable °

## 2022-05-24 ENCOUNTER — Encounter: Payer: PPO | Admitting: Pharmacist

## 2022-05-26 DIAGNOSIS — R69 Illness, unspecified: Secondary | ICD-10-CM | POA: Diagnosis not present

## 2022-06-10 ENCOUNTER — Ambulatory Visit (INDEPENDENT_AMBULATORY_CARE_PROVIDER_SITE_OTHER): Payer: Medicare HMO | Admitting: Family

## 2022-06-10 ENCOUNTER — Encounter: Payer: Self-pay | Admitting: Family

## 2022-06-10 VITALS — BP 118/64 | HR 80 | Temp 97.8°F | Ht 69.0 in | Wt 172.4 lb

## 2022-06-10 DIAGNOSIS — F321 Major depressive disorder, single episode, moderate: Secondary | ICD-10-CM | POA: Diagnosis not present

## 2022-06-10 DIAGNOSIS — R5383 Other fatigue: Secondary | ICD-10-CM | POA: Diagnosis not present

## 2022-06-10 DIAGNOSIS — Z7901 Long term (current) use of anticoagulants: Secondary | ICD-10-CM

## 2022-06-10 DIAGNOSIS — R0689 Other abnormalities of breathing: Secondary | ICD-10-CM

## 2022-06-10 DIAGNOSIS — Z9229 Personal history of other drug therapy: Secondary | ICD-10-CM | POA: Diagnosis not present

## 2022-06-10 DIAGNOSIS — R0683 Snoring: Secondary | ICD-10-CM | POA: Diagnosis not present

## 2022-06-10 MED ORDER — ESCITALOPRAM OXALATE 20 MG PO TABS: 20.0000 mg | ORAL_TABLET | Freq: Every day | ORAL | 3 refills | Status: DC

## 2022-06-10 NOTE — Assessment & Plan Note (Signed)
Did advise against use of daily tumeric as works similiarly to NSAIDS and can increase risk of bleeding. Will continue to monitor lexapro with kidney function as this can also increase a low risk of increased bleeding.

## 2022-06-10 NOTE — Progress Notes (Signed)
Established Patient Office Visit  Subjective:      CC:  Chief Complaint  Patient presents with   Medical Management of Chronic Issues    Low interest in doing things    HPI: John Salazar is a 77 y.o. male presenting on 06/10/2022 for Medical Management of Chronic Issues (Low interest in doing things) .   New complaints: Noticing that she has decreased interest in hobbies, and doing things that he would usually like to do. He is no longer doing hobbies that he used to find joy in, no longer wanting to fish and or whittling. He is finding that he is isolating himself a bit more as well, not going out of the house .Marland Kitchenas often. He does go to church at times to keep himself busy. He is currently on lexapro 10 mg, not helping as much anymore.   He is outside working in the yard which helps slightly however he does have limitations that he finds frustrating with left knee pain and right achilles tendonitis. Due to this ongoing issue he has made a new consult with a podiatrist for his foot and an orthopedist for his knee, in the next one week.   He is in remission for the last 30 years from alcohol abuse. He does go to AA regularly, and finds this is helpful for therapy.      06/10/2022   10:13 AM 05/23/2022    8:19 AM 05/19/2022    2:14 PM  PHQ9 SCORE ONLY  PHQ-9 Total Score 6 7 0      06/10/2022   10:13 AM 05/23/2022    8:20 AM 01/14/2019    8:13 AM  GAD 7 : Generalized Anxiety Score  Nervous, Anxious, on Edge 0 0 1  Control/stop worrying 0 0 0  Worry too much - different things 0 0 1  Trouble relaxing 0 0 1  Restless 0 0 0  Easily annoyed or irritable 0 0 1  Afraid - awful might happen 0 0 0  Total GAD 7 Score 0 0 4  Anxiety Difficulty Not difficult at all Not difficult at all Somewhat difficult   Denies increasing sob, no edema in lower extremities.  Does often feel tired throughout the day and naps often. He does snore. Has been referred to the pulmonologist but has not yet  made an appointment.      Social history:  Relevant past medical, surgical, family and social history reviewed and updated as indicated. Interim medical history since our last visit reviewed.  Allergies and medications reviewed and updated.  DATA REVIEWED: CHART IN EPIC     ROS: Negative unless specifically indicated above in HPI.    Current Outpatient Medications:    amLODipine (NORVASC) 10 MG tablet, Patient taking 1/2 tablet (5 mg total) by mouth daily, Disp: , Rfl:    aspirin EC 81 MG tablet, Take 1 tablet (81 mg total) by mouth daily. Swallow whole., Disp: 90 tablet, Rfl: 3   clopidogrel (PLAVIX) 75 MG tablet, TAKE ONE TABLET BY MOUTH DAILY, Disp: 90 tablet, Rfl: 3   escitalopram (LEXAPRO) 20 MG tablet, Take 1 tablet (20 mg total) by mouth daily., Disp: 90 tablet, Rfl: 3   famotidine (PEPCID) 20 MG tablet, Take 20 mg by mouth daily as needed for heartburn or indigestion., Disp: , Rfl:    isosorbide mononitrate (IMDUR) 60 MG 24 hr tablet, Take 0.5 tablets (30 mg total) by mouth daily. May take an additional one-half tablet (30 mg)  by mouth as needed for chest pain, Disp: 90 tablet, Rfl: 3   losartan (COZAAR) 50 MG tablet, Take 1 tablet (50 mg total) by mouth daily., Disp: 90 tablet, Rfl: 3   nitroGLYCERIN (NITROSTAT) 0.4 MG SL tablet, Place 1 tablet (0.4 mg total) under the tongue every 5 (five) minutes as needed for chest pain., Disp: 25 tablet, Rfl: prn   pantoprazole (PROTONIX) 40 MG tablet, Take 1 tablet (40 mg total) by mouth daily., Disp: 90 tablet, Rfl: 1   rosuvastatin (CRESTOR) 40 MG tablet, Take one (1) tablet by mouth (40 mg) daily., Disp: 90 tablet, Rfl: 2   tamsulosin (FLOMAX) 0.4 MG CAPS capsule, Take two capsules once daily (total 0.8 mg) thirty minutes after same meal daily, Disp: 60 capsule, Rfl: 2   Turmeric 500 MG TABS, Take 1 tablet by mouth as needed (for inflamation)., Disp: , Rfl:       Objective:    BP 118/64 (BP Location: Left Arm)   Pulse 80   Temp  97.8 F (36.6 C) (Temporal)   Ht 5\' 9"  (1.753 m)   Wt 172 lb 6.4 oz (78.2 kg)   SpO2 98%   BMI 25.46 kg/m   Wt Readings from Last 3 Encounters:  06/10/22 172 lb 6.4 oz (78.2 kg)  05/23/22 172 lb 12.8 oz (78.4 kg)  05/19/22 170 lb (77.1 kg)    Physical Exam Constitutional:      General: He is not in acute distress.    Appearance: Normal appearance. He is normal weight. He is not ill-appearing, toxic-appearing or diaphoretic.  Cardiovascular:     Rate and Rhythm: Normal rate and regular rhythm.  Pulmonary:     Effort: Pulmonary effort is normal.     Breath sounds: Normal breath sounds.  Musculoskeletal:        General: Normal range of motion.  Neurological:     General: No focal deficit present.     Mental Status: He is alert and oriented to person, place, and time. Mental status is at baseline.  Psychiatric:        Mood and Affect: Mood normal.        Behavior: Behavior normal.        Thought Content: Thought content normal.        Judgment: Judgment normal.           Assessment & Plan:  Depression, major, single episode, moderate (HCC) Assessment & Plan: Increase lexapro to 20 mg once daily.  Continue with AA for therapy . Declines additional therapy at this time.  Keep appt with orthopedist and podiatrist as well.    Orders: -     Escitalopram Oxalate; Take 1 tablet (20 mg total) by mouth daily.  Dispense: 90 tablet; Refill: 3  Snoring -     Ambulatory referral to Pulmonology  Gasping for breath -     Ambulatory referral to Pulmonology  Other fatigue -     Ambulatory referral to Pulmonology  Hx of long term use of blood thinners Assessment & Plan: Did advise against use of daily tumeric as works similiarly to NSAIDS and can increase risk of bleeding. Will continue to monitor lexapro with kidney function as this can also increase a low risk of increased bleeding.      Return in about 4 weeks (around 07/08/2022) for f/u depression .  Mort Sawyers,  MSN, APRN, FNP-C [facility]

## 2022-06-10 NOTE — Assessment & Plan Note (Addendum)
Increase lexapro to 20 mg once daily.  Continue with AA for therapy . Declines additional therapy at this time.  Keep appt with orthopedist and podiatrist as well.

## 2022-06-10 NOTE — Patient Instructions (Addendum)
  Increase lexapro to 20 mg once daily . Continue with AA.  See orthopedist and podiatrist as scheduled.   A referral was placed today for sleep apnea with pulmonology.  Please let us know if you have not heard back within 2 weeks about the referral.   Regards,   Sarabeth Benton FNP-C

## 2022-06-25 ENCOUNTER — Other Ambulatory Visit: Payer: Self-pay | Admitting: Internal Medicine

## 2022-06-27 ENCOUNTER — Other Ambulatory Visit: Payer: Self-pay

## 2022-06-27 DIAGNOSIS — K2270 Barrett's esophagus without dysplasia: Secondary | ICD-10-CM

## 2022-06-27 NOTE — Telephone Encounter (Signed)
pantoprazole (PROTONIX) 40 MG tablet LOV 06/10/2022 NOV No follow up

## 2022-06-28 MED ORDER — PANTOPRAZOLE SODIUM 40 MG PO TBEC: 40.0000 mg | DELAYED_RELEASE_TABLET | Freq: Every day | ORAL | 1 refills | Status: DC

## 2022-07-07 DIAGNOSIS — Z85828 Personal history of other malignant neoplasm of skin: Secondary | ICD-10-CM | POA: Diagnosis not present

## 2022-07-07 DIAGNOSIS — B351 Tinea unguium: Secondary | ICD-10-CM | POA: Diagnosis not present

## 2022-07-07 DIAGNOSIS — L570 Actinic keratosis: Secondary | ICD-10-CM | POA: Diagnosis not present

## 2022-07-07 DIAGNOSIS — L814 Other melanin hyperpigmentation: Secondary | ICD-10-CM | POA: Diagnosis not present

## 2022-07-07 DIAGNOSIS — L821 Other seborrheic keratosis: Secondary | ICD-10-CM | POA: Diagnosis not present

## 2022-07-07 DIAGNOSIS — Z08 Encounter for follow-up examination after completed treatment for malignant neoplasm: Secondary | ICD-10-CM | POA: Diagnosis not present

## 2022-07-07 DIAGNOSIS — D1801 Hemangioma of skin and subcutaneous tissue: Secondary | ICD-10-CM | POA: Diagnosis not present

## 2022-08-03 NOTE — Progress Notes (Signed)
08/05/22- 76yoM former smoker (50pkyrs) for sleep evaluation with concern of gasping for breath, snoring. Medical problem list includes HTN CAD, Brain Aneurysm, Carotid Stenosis, GERD/ Barrett's Esophagus, BPH, Hyperlipidemia, Anemia, Depression, CKD3,  Epworth score-18 Body weight today- 174 lbs Married, retired. Notes waking 3x/ night for nocturia. Usually not long to get back to sleep. Bedtime 9-10 PM, up 5 AM. No sleep med, 2 cups coffee, daily nap. Wife tells him he snores. Shifts position but not complex parasomnias. Dozes off easily. Tired and sleepy in day. Active GERD, on Rx. Told in past CXR indicated some emphysema, aware of CAD. ENT surgery for tonsils. CXR 01/15/22- IMPRESSION: Low lung volumes with symmetric elevation of the hemidiaphragms. No active cardiopulmonary disease.  Prior to Admission medications   Medication Sig Start Date End Date Taking? Authorizing Provider  amLODipine (NORVASC) 5 MG tablet Take 5 mg by mouth daily. 07/05/22  Yes [provider]  aspirin EC 81 MG tablet Take 1 tablet (81 mg total) by mouth daily. Swallow whole. 08/23/21  Yes Weaver, Scott T, PA-C  clopidogrel (PLAVIX) 75 MG tablet TAKE ONE TABLET BY MOUTH DAILY 06/27/22  Yes Pricilla Riffle, MD  escitalopram (LEXAPRO) 20 MG tablet Take 1 tablet (20 mg total) by mouth daily. 06/10/22  Yes Mort Sawyers, FNP  famotidine (PEPCID) 20 MG tablet Take 20 mg by mouth daily as needed for heartburn or indigestion.   Yes [provider]  isosorbide mononitrate (IMDUR) 60 MG 24 hr tablet Take 0.5 tablets (30 mg total) by mouth daily. May take an additional one-half tablet (30 mg) by mouth as needed for chest pain 04/20/21  Yes Asa Lente, Tessa N, PA-C  losartan (COZAAR) 50 MG tablet Take 1 tablet (50 mg total) by mouth daily. 10/08/21  Yes Weaver, Scott T, PA-C  nitroGLYCERIN (NITROSTAT) 0.4 MG SL tablet Place 1 tablet (0.4 mg total) under the tongue every 5 (five) minutes as needed for chest pain. 04/11/22  04/11/23 Yes Pricilla Riffle, MD  pantoprazole (PROTONIX) 40 MG tablet Take 1 tablet (40 mg total) by mouth daily. 06/28/22  Yes Dugal, Wyatt Mage, FNP  rosuvastatin (CRESTOR) 40 MG tablet Take one (1) tablet by mouth (40 mg) daily. 04/04/22  Yes Pricilla Riffle, MD  tamsulosin [facility]) 0.4 MG CAPS capsule Take two capsules once daily (total 0.8 mg) thirty minutes after same meal daily 10/28/21  Yes Mort Sawyers, FNP   Past Medical History:  Diagnosis Date   Anxiety    Ascending aorta dilation (HCC) 08/22/2021   Echocardiogram 04/2021: 40 mm   Barrett esophagus    BPH (benign prostatic hyperplasia)    Cerebral aneurysm    Chronic kidney disease    stage 2   Colon polyps    Coronary artery disease    mild-mod non-obstructive CAD   Erectile dysfunction    GERD (gastroesophageal reflux disease)    History of kidney stones    History of stomach ulcers    Hypercholesteremia    Hypertension    Positive TB test    in the past   Past Surgical History:  Procedure Laterality Date   APPENDECTOMY  1970   CATARACT EXTRACTION W/ INTRAOCULAR LENS  IMPLANT, BILATERAL     COLONOSCOPY  10/2012   ESOPHAGOGASTRODUODENOSCOPY ENDOSCOPY  10/2012   HERNIA REPAIR     IR 3D INDEPENDENT WKST  04/14/2020   IR ANGIO INTRA EXTRACRAN SEL COM CAROTID INNOMINATE BILAT MOD SED  08/09/2017   IR ANGIO INTRA EXTRACRAN SEL COM CAROTID INNOMINATE  BILAT MOD SED  11/08/2018   IR ANGIO INTRA EXTRACRAN SEL COM CAROTID INNOMINATE BILAT MOD SED  04/14/2020   IR ANGIO INTRA EXTRACRAN SEL COM CAROTID INNOMINATE UNI L MOD SED  06/29/2020   IR ANGIO INTRA EXTRACRAN SEL INTERNAL CAROTID UNI L MOD SED  05/20/2020   IR ANGIO VERTEBRAL SEL VERTEBRAL BILAT MOD SED  08/09/2017   IR ANGIO VERTEBRAL SEL VERTEBRAL BILAT MOD SED  11/08/2018   IR ANGIO VERTEBRAL SEL VERTEBRAL BILAT MOD SED  04/14/2020   IR ANGIOGRAM FOLLOW UP STUDY  05/20/2020   IR INTRAVSC STENT CERV CAROTID W/EMB-PROT MOD SED INCL ANGIO  08/23/2017   IR INTRAVSC STENT CERV CAROTID W/EMB-PROT  MOD SED INCL ANGIO  06/29/2020   IR RADIOLOGIST EVAL & MGMT  05/05/2020   IR RADIOLOGIST EVAL & MGMT  06/03/2020   IR RADIOLOGIST EVAL & MGMT  07/15/2020   IR TRANSCATH/EMBOLIZ  05/18/2020   IR US GUIDE VASC ACCESS RIGHT  04/14/2020   IR US GUIDE VASC ACCESS RIGHT  05/20/2020   IR US GUIDE VASC ACCESS RIGHT  06/29/2020   LEFT HEART CATH AND CORONARY ANGIOGRAPHY N/A 11/15/2018   Procedure: LEFT HEART CATH AND CORONARY ANGIOGRAPHY;  Surgeon: Yvonne Kendall, MD;  Location: [facility] IN[facility]IVE [facility];  Service: Cardiovascular;  Laterality: N/A;   PROSTATE SURGERY  1999   20 years ago, enlarged prostate   RADIOLOGY WITH ANESTHESIA N/A 08/23/2017   Procedure: IR WITH ANESTHESIA STENT PLACEMENT;  Surgeon: Julieanne Cotton, MD;  Location: [facility] OR;  Service: Radiology;  Laterality: N/A;   RADIOLOGY WITH ANESTHESIA N/A 05/18/2020   Procedure: IR WITH ANESTHESIA EMBOLIZATION;  Surgeon: Julieanne Cotton, MD;  Location: [facility] OR;  Service: Radiology;  Laterality: N/A;   RADIOLOGY WITH ANESTHESIA Left 06/29/2020   Procedure: RADIOLOGY WITH ANESTHESIA  LEFT CAROTID STENT PLACEMENT;  Surgeon: Julieanne Cotton, MD;  Location: [facility] OR;  Service: Radiology;  Laterality: Left;   Family History  Problem Relation Age of Onset   Stroke Mother 35   Aneurysm Father 29   Social History   Socioeconomic History   Marital status: Married    Spouse name: pamela   Number of children: 2   Years of education: 12   Highest education level: Not on file  Occupational History   Occupation: Comptroller.  Tobacco Use   Smoking status: Former    Packs/day: 2.00    Years: 25.00    Additional pack years: 0.00    Total pack years: 50.00    Types: Cigarettes    Quit date: 03/10/1996    Years since quitting: 26.4   Smokeless tobacco: Never  Vaping Use   Vaping Use: Never used  Substance and Sexual Activity   Alcohol use: No    Alcohol/week: 0.0 standard drinks of alcohol    Comment: in the past drank, quit 1993   Drug use:  No   Sexual activity: Not Currently  Other Topics Concern   Not on file  Social History Narrative   Lives with Rinaldo Cloud (Wife) - together for 28 years   Children - French Ana and Raynelle Fanning -- one is a Engineer, civil (consulting) in [facility] and other works for [employer] - 18, lives in [place]   Enjoys - playing golf, goes to [employer] and sponsors people as well, church   Support - family, good friends in [place]   Exercise - walks the dog, runs up hills   Diet - improved from before, tries to drink  water - low salt or no salt      Retail buyer for [employer]    Social Determinants of Health   Financial Resource Strain: Low Risk  (05/19/2022)   Overall Financial Resource Strain (CARDIA)    Difficulty of Paying Living Expenses: Not hard at all  Food Insecurity: No Food Insecurity (05/19/2022)   Hunger Vital Sign    Worried About Running Out of Food in the Last Year: Never true    Ran Out of Food in the Last Year: Never true  Transportation Needs: No Transportation Needs (05/19/2022)   PRAPARE - Administrator, Civil Service (Medical): No    Lack of Transportation (Non-Medical): No  Physical Activity: Sufficiently Active (05/19/2022)   Exercise Vital Sign    Days of Exercise per Week: 5 days    Minutes of Exercise per Session: 30 min  Stress: No Stress Concern Present (05/19/2022)   [employer] of Occupational Health - Occupational Stress Questionnaire    Feeling of Stress : Not at all  Social Connections: Socially Integrated (05/19/2022)   Social Connection and Isolation Panel [NHANES]    Frequency of Communication with Friends and Family: More than three times a week    Frequency of Social Gatherings with Friends and Family: More than three times a week    Attends Religious Services: More than 4 times per year    Active Member of [employer] or Organizations: Yes    Attends Engineer, structural: More than 4 times per year    Marital Status: Married  Catering manager  Violence: Not At Risk (05/19/2022)   Humiliation, Afraid, Rape, and Kick questionnaire    Fear of Current or Ex-Partner: No    Emotionally Abused: No    Physically Abused: No    Sexually Abused: No   ROS-see HPI   + = positive Constitutional:    weight loss, night sweats, fevers, chills, fatigue, lassitude. HEENT:    headaches, difficulty swallowing, tooth/dental problems, sore throat,       sneezing, itching, ear ache, nasal congestion, post nasal drip, snoring CV:    chest pain, orthopnea, PND, swelling in lower extremities, anasarca,                                  dizziness, palpitations Resp:   +shortness of breath with exertion or at rest.                productive cough,   non-productive cough, coughing up of blood.              change in color of mucus.  wheezing.   Skin:    rash or lesions. GI:  No-   heartburn, indigestion, abdominal pain, nausea, vomiting, diarrhea,                 change in bowel habits, loss of appetite GU: dysuria, change in color of urine, no urgency or frequency.   flank pain. MS:   joint pain, +stiffness, decreased range of motion, back pain. Neuro-     nothing unusual Psych:  change in mood or affect.  depression or anxiety.   memory loss.  OBJ- Physical Exam General- Alert, Oriented, Affect-appropriate, Distress- none acute, + lean Skin- rash-none, lesions- none, excoriation- none Lymphadenopathy- none Head- atraumatic            Eyes- Gross vision intact, PERRLA, conjunctivae and secretions clear  Ears- Hearing, canals-normal            Nose- Clear, no-Septal dev, mucus, polyps, erosion, perforation             Throat- Mallampati IV, mucosa clear , +frequent throat clearing, tonsils+absent, +dental repair/ partial, Neck- flexible , trachea midline, no stridor , thyroid nl, carotid no bruit Chest - symmetrical excursion , unlabored           Heart/CV- RRR , no murmur , no gallop  , no rub, nl s1 s2                           - JVD- none  , edema- none, stasis changes- none, varices- none           Lung- clear to P&A, wheeze- none, cough- none , dullness-none, rub- none           Chest wall-  Abd-  Br/ Gen/ Rectal- Not done, not indicated Extrem- cyanosis- none, clubbing, none, atrophy- none, strength- nl Neuro- grossly intact to observation

## 2022-08-05 ENCOUNTER — Ambulatory Visit (INDEPENDENT_AMBULATORY_CARE_PROVIDER_SITE_OTHER): Payer: Medicare HMO | Admitting: Internal Medicine

## 2022-08-05 ENCOUNTER — Encounter: Payer: Self-pay | Admitting: Internal Medicine

## 2022-08-05 VITALS — BP 112/72 | HR 72 | Ht 69.0 in | Wt 174.6 lb

## 2022-08-05 DIAGNOSIS — K22719 Barrett's esophagus with dysplasia, unspecified: Secondary | ICD-10-CM

## 2022-08-05 DIAGNOSIS — R0683 Snoring: Secondary | ICD-10-CM | POA: Diagnosis not present

## 2022-08-05 NOTE — Patient Instructions (Signed)
Order- schedule home sleep test     dx Snoring  Please call us about 2 weeks after your test for results and recommendations

## 2022-08-07 ENCOUNTER — Encounter: Payer: Self-pay | Admitting: Internal Medicine

## 2022-08-07 NOTE — Assessment & Plan Note (Signed)
Obstructive sleep apnea seems likely.  There is some sleep disturbance by frequent nocturia. Plan- appropriate discussion done. Ordering sleep study. He indicates potential difficulty tolerating CPAP. Alternatives discussed.

## 2022-08-07 NOTE — Assessment & Plan Note (Signed)
Frequent throat clearing noted. Active reflux seems likely.  Basic reflux precautions emphasized.

## 2022-08-21 DIAGNOSIS — G4730 Sleep apnea, unspecified: Secondary | ICD-10-CM | POA: Diagnosis not present

## 2022-08-23 DIAGNOSIS — N401 Enlarged prostate with lower urinary tract symptoms: Secondary | ICD-10-CM | POA: Diagnosis not present

## 2022-08-23 DIAGNOSIS — R351 Nocturia: Secondary | ICD-10-CM | POA: Diagnosis not present

## 2022-09-12 ENCOUNTER — Encounter: Payer: Self-pay | Admitting: Internal Medicine

## 2022-09-12 NOTE — Telephone Encounter (Signed)
Pt is calling for HST Results

## 2022-09-12 NOTE — Telephone Encounter (Signed)
Please check and see if we have received his results. They have not been scanned in.

## 2022-09-13 NOTE — Telephone Encounter (Signed)
SNAP website states that it is being analyzed

## 2022-09-19 ENCOUNTER — Ambulatory Visit: Payer: Medicare HMO | Admitting: Dermatology

## 2022-09-21 ENCOUNTER — Encounter (INDEPENDENT_AMBULATORY_CARE_PROVIDER_SITE_OTHER): Payer: Medicare HMO

## 2022-09-21 DIAGNOSIS — G4733 Obstructive sleep apnea (adult) (pediatric): Secondary | ICD-10-CM

## 2022-09-21 DIAGNOSIS — R0683 Snoring: Secondary | ICD-10-CM

## 2022-09-23 ENCOUNTER — Other Ambulatory Visit: Payer: Self-pay | Admitting: Physician Assistant

## 2022-09-30 ENCOUNTER — Other Ambulatory Visit: Payer: Self-pay | Admitting: Internal Medicine

## 2022-10-03 ENCOUNTER — Encounter: Payer: Self-pay | Admitting: Internal Medicine

## 2022-10-05 NOTE — Telephone Encounter (Signed)
PT calling to get results and tx plan from Sleep Study. Please call @ [phone]

## 2022-10-07 NOTE — Telephone Encounter (Signed)
Home sleep test indicates severe obstructive slep apnea, averaging 39 apneas/ hour  I recommend we order new DME, new CPAP auto 5-20, mask of choice, humidifier, supplies, AirView/ card

## 2022-10-07 NOTE — Telephone Encounter (Signed)
Sleep study was scanned into Epic 09/21/22

## 2022-10-21 NOTE — Telephone Encounter (Signed)
These results are in the chart and has to come from the nurse or the provider PCC's do not give results

## 2022-10-24 NOTE — Telephone Encounter (Signed)
Home sleep test showed severe obstructive sleep apnea, averaging 39 apneas/ hour with drops in blood oxygen level.  I recommend we order new DME, new CPAP auto 5-20, mask of choice, humidifier, supplies, AirView/ card  We will need to reschedule his follow-up ov, per insurance regs, for 31-90 days.

## 2022-10-24 NOTE — Telephone Encounter (Signed)
Pt requesting sleep study results. Dr. Maple Hudson please advise

## 2022-11-04 ENCOUNTER — Ambulatory Visit: Payer: Medicare HMO | Admitting: Internal Medicine

## 2022-11-08 ENCOUNTER — Other Ambulatory Visit (HOSPITAL_COMMUNITY): Payer: Self-pay | Admitting: Interventional Radiology

## 2022-11-08 DIAGNOSIS — I771 Stricture of artery: Secondary | ICD-10-CM

## 2022-11-09 ENCOUNTER — Encounter: Payer: Self-pay | Admitting: Family

## 2022-11-09 NOTE — Telephone Encounter (Signed)
Can we call patient and inquire if he did or did not end up seeing urology last October? If he is, I think this question would be better suited for them so they can also identify if another problem going on.   Have you reached out to cardiology in regards to your blood pressure and feeling lightheaded? If no, I would like you to either get in with me or them as soon as possible to assess further other reasons you might be experiencing this.

## 2022-11-12 NOTE — Progress Notes (Unsigned)
08/05/22- 76yoM former smoker (50pkyrs) for sleep evaluation with concern of gasping for breath, snoring. Medical problem list includes HTN CAD, Brain Aneurysm, Carotid Stenosis, GERD/ Barrett's Esophagus, BPH, Hyperlipidemia, Anemia, Depression, CKD3,  Epworth score-18 Body weight today- 174 lbs Married, retired. Notes waking 3x/ night for nocturia. Usually not long to get back to sleep. Bedtime 9-10 PM, up 5 AM. No sleep med, 2 cups coffee, daily nap. Wife tells him he snores. Shifts position but not complex parasomnias. Dozes off easily. Tired and sleepy in day. Active GERD, on Rx. Told in past CXR indicated some emphysema, aware of CAD. ENT surgery for tonsils. CXR 01/15/22- IMPRESSION: Low lung volumes with symmetric elevation of the hemidiaphragms. No active cardiopulmonary disease.  11/14/22- 77 yoM former smoker (50pkyrs) followed for OSA, complicated by  HTN CAD, Brain Aneurysm, Carotid Stenosis, GERD/ Barrett's Esophagus, BPH, Hyperlipidemia, Anemia, Depression, CKD3,  HST 08/21/22-AHI 39.7/hr, desaturation to 84%, body weight 170 lbs Body weight today- For treatment decision Had flu vax Discussed options. Will start with CPAP auto 5-20. Note frequent throat clearing. He has hx GERD and postnasal drip.  ROS-see HPI   + = positive Constitutional:    weight loss, night sweats, fevers, chills, fatigue, lassitude. HEENT:    headaches, difficulty swallowing, tooth/dental problems, sore throat,       sneezing, itching, ear ache, nasal congestion, post nasal drip, snoring CV:    chest pain, orthopnea, PND, swelling in lower extremities, anasarca,                                  dizziness, palpitations Resp:   +shortness of breath with exertion or at rest.                productive cough,   non-productive cough, coughing up of blood.              change in color of mucus.  wheezing.   Skin:    rash or lesions. GI:  No-   heartburn, indigestion, abdominal pain, nausea, vomiting,  diarrhea,                 change in bowel habits, loss of appetite GU: dysuria, change in color of urine, no urgency or frequency.   flank pain. MS:   joint pain, +stiffness, decreased range of motion, back pain. Neuro-     nothing unusual Psych:  change in mood or affect.  depression or anxiety.   memory loss.  OBJ- Physical Exam General- Alert, Oriented, Affect-appropriate, Distress- none acute, + lean Skin- rash-none, lesions- none, excoriation- none Lymphadenopathy- none Head- atraumatic            Eyes- Gross vision intact, PERRLA, conjunctivae and secretions clear            Ears- Hearing, canals-normal            Nose- Clear, no-Septal dev, mucus, polyps, erosion, perforation             Throat- Mallampati IV, mucosa clear , +frequent throat clearing, tonsils+absent, +dental repair/ partial, Neck- flexible , trachea midline, no stridor , thyroid nl, carotid no bruit Chest - symmetrical excursion , unlabored           Heart/CV- RRR , no murmur , no gallop  , no rub, nl s1 s2                           -  JVD- none , edema- none, stasis changes- none, varices- none           Lung- +clear to P&A, wheeze- none, cough- none , dullness-none, rub- none           Chest wall-  Abd-  Br/ Gen/ Rectal- Not done, not indicated Extrem- cyanosis- none, clubbing, none, atrophy- none, strength- nl Neuro- grossly intact to observation

## 2022-11-14 ENCOUNTER — Encounter: Payer: Self-pay | Admitting: Internal Medicine

## 2022-11-14 ENCOUNTER — Ambulatory Visit: Payer: Medicare HMO | Admitting: Internal Medicine

## 2022-11-14 VITALS — BP 108/71 | HR 70 | Temp 97.8°F | Ht 69.0 in | Wt 175.6 lb

## 2022-11-14 DIAGNOSIS — K22719 Barrett's esophagus with dysplasia, unspecified: Secondary | ICD-10-CM | POA: Diagnosis not present

## 2022-11-14 DIAGNOSIS — R0683 Snoring: Secondary | ICD-10-CM

## 2022-11-14 DIAGNOSIS — G4733 Obstructive sleep apnea (adult) (pediatric): Secondary | ICD-10-CM | POA: Insufficient documentation

## 2022-11-14 NOTE — Assessment & Plan Note (Signed)
Note frequent throat clearing. Emphasize reflux precautions. F/u with primary.

## 2022-11-14 NOTE — Patient Instructions (Addendum)
Order- new DME, new CPAP auto 5-20, mask of choice, humidifier, supplies, Airview/ card ° °Please call if we can help °

## 2022-11-14 NOTE — Assessment & Plan Note (Signed)
Severe OSA. Recommended CPAP and discussed Plan- new CPAP auto 5-20

## 2022-11-19 NOTE — Telephone Encounter (Signed)
Pt had recent visit and order for cpap start was placed during that visit.

## 2022-11-21 ENCOUNTER — Ambulatory Visit (HOSPITAL_COMMUNITY)
Admission: RE | Admit: 2022-11-21 | Discharge: 2022-11-21 | Disposition: A | Discharge status: Home / Self Care | Payer: Medicare HMO | Source: Ambulatory Visit | Attending: Interventional Radiology | Admitting: Interventional Radiology

## 2022-11-21 DIAGNOSIS — I771 Stricture of artery: Secondary | ICD-10-CM | POA: Diagnosis not present

## 2022-11-21 DIAGNOSIS — I672 Cerebral atherosclerosis: Secondary | ICD-10-CM | POA: Diagnosis not present

## 2022-11-21 MED ORDER — IOHEXOL 350 MG/ML SOLN
75.0000 mL | Freq: Once | INTRAVENOUS | Status: AC | PRN
  Administered 2022-11-21: 75 mL via INTRAVENOUS

## 2022-11-23 ENCOUNTER — Encounter: Payer: Self-pay | Admitting: Internal Medicine

## 2022-11-24 NOTE — Telephone Encounter (Signed)
Please have him get in for an office visit I would like to assess his blood pressure and look into the dizziness a little bit further..  We can then discuss in terms of urology versus nephrology and what medications might be best suited for him.

## 2022-11-25 NOTE — Progress Notes (Unsigned)
Cardiology Office Note   Date:  11/28/2022   ID:  John Salazar [birth year], MRN [id number]  PCP:  Mort Sawyers, FNP  Cardiologist:   Dietrich Pates, MD   Pt presents for f/u of HTN and CAD     History of Present Illness: John Salazar is a 77 y.o. male with a history of CAD, HTN and CV dz (s/p PTA/ stent 2019, 2022), GERD, EtOH use    In 2020, LHC showed mod CAD and one tight OM lesion.  Plan was to continue medical Rx     I saw the pt in March 2024   Since seen the patient denies CP  He says his beathing is fair   Gives out some   No change since he was last in clinic  Notes some dizziness when BP in 100s / 110s  BP readings from home 108 to 150s        Current Meds  Medication Sig   amLODipine (NORVASC) 5 MG tablet take ONE tablet ONCE a DAY.   aspirin EC 81 MG tablet Take 1 tablet (81 mg total) by mouth daily. Swallow whole.   clopidogrel (PLAVIX) 75 MG tablet TAKE ONE TABLET BY MOUTH DAILY   escitalopram (LEXAPRO) 20 MG tablet Take 1 tablet (20 mg total) by mouth daily.   famotidine (PEPCID) 20 MG tablet Take 20 mg by mouth daily as needed for heartburn or indigestion.   losartan (COZAAR) 50 MG tablet TAKE ONE TABLET BY MOUTH DAILY   nitroGLYCERIN (NITROSTAT) 0.4 MG SL tablet Place 1 tablet (0.4 mg total) under the tongue every 5 (five) minutes as needed for chest pain.   pantoprazole (PROTONIX) 40 MG tablet Take 1 tablet (40 mg total) by mouth daily.   rosuvastatin (CRESTOR) 40 MG tablet Take one (1) tablet by mouth (40 mg) daily.   tamsulosin (FLOMAX) 0.4 MG CAPS capsule Take two capsules once daily (total 0.8 mg) thirty minutes after same meal daily (Patient taking differently: Take 0.4 mg by mouth daily. Take two capsules once daily (total 0.8 mg) thirty minutes after same meal daily)     Allergies:   Bee venom and Penicillins   Past Medical History:  Diagnosis Date   Anxiety    Ascending aorta dilation (HCC) 08/22/2021   Echocardiogram 04/2021: 40 mm    Barrett esophagus    BPH (benign prostatic hyperplasia)    Cerebral aneurysm    Chronic kidney disease    stage 2   Colon polyps    Coronary artery disease    mild-mod non-obstructive CAD   Erectile dysfunction    GERD (gastroesophageal reflux disease)    History of kidney stones    History of stomach ulcers    Hypercholesteremia    Hypertension    Positive TB test    in the past    Past Surgical History:  Procedure Laterality Date   APPENDECTOMY  1970   CATARACT EXTRACTION W/ INTRAOCULAR LENS  IMPLANT, BILATERAL     COLONOSCOPY  10/2012   ESOPHAGOGASTRODUODENOSCOPY ENDOSCOPY  10/2012   HERNIA REPAIR     IR 3D INDEPENDENT WKST  04/14/2020   IR ANGIO INTRA EXTRACRAN SEL COM CAROTID INNOMINATE BILAT MOD SED  08/09/2017   IR ANGIO INTRA EXTRACRAN SEL COM CAROTID INNOMINATE BILAT MOD SED  11/08/2018   IR ANGIO INTRA EXTRACRAN SEL COM CAROTID INNOMINATE BILAT MOD SED  04/14/2020   IR ANGIO INTRA EXTRACRAN SEL COM CAROTID INNOMINATE UNI L MOD SED  06/29/2020   IR ANGIO INTRA EXTRACRAN SEL INTERNAL CAROTID UNI L MOD SED  05/20/2020   IR ANGIO VERTEBRAL SEL VERTEBRAL BILAT MOD SED  08/09/2017   IR ANGIO VERTEBRAL SEL VERTEBRAL BILAT MOD SED  11/08/2018   IR ANGIO VERTEBRAL SEL VERTEBRAL BILAT MOD SED  04/14/2020   IR ANGIOGRAM FOLLOW UP STUDY  05/20/2020   IR INTRAVSC STENT CERV CAROTID W/EMB-PROT MOD SED INCL ANGIO  08/23/2017   IR INTRAVSC STENT CERV CAROTID W/EMB-PROT MOD SED INCL ANGIO  06/29/2020   IR RADIOLOGIST EVAL & MGMT  05/05/2020   IR RADIOLOGIST EVAL & MGMT  06/03/2020   IR RADIOLOGIST EVAL & MGMT  07/15/2020   IR TRANSCATH/EMBOLIZ  05/18/2020   IR US GUIDE VASC ACCESS RIGHT  04/14/2020   IR US GUIDE VASC ACCESS RIGHT  05/20/2020   IR US GUIDE VASC ACCESS RIGHT  06/29/2020   LEFT HEART CATH AND CORONARY ANGIOGRAPHY N/A 11/15/2018   Procedure: LEFT HEART CATH AND CORONARY ANGIOGRAPHY;  Surgeon: Yvonne Kendall, MD;  [facility];  Service: Cardiovascular;  Laterality: N/A;    PROSTATE SURGERY  1999   20 years ago, enlarged prostate   RADIOLOGY WITH ANESTHESIA N/A 08/23/2017   Procedure: IR WITH ANESTHESIA STENT PLACEMENT;  Surgeon: Julieanne Cotton, MD;  Location: [facility];  Service: Radiology;  Laterality: N/A;   RADIOLOGY WITH ANESTHESIA N/A 05/18/2020   Procedure: IR WITH ANESTHESIA EMBOLIZATION;  Surgeon: Julieanne Cotton, MD;  Location: [facility];  Service: Radiology;  Laterality: N/A;   RADIOLOGY WITH ANESTHESIA Left 06/29/2020   Procedure: RADIOLOGY WITH ANESTHESIA  LEFT CAROTID STENT PLACEMENT;  Surgeon: Julieanne Cotton, MD;  Location: [facility];  Service: Radiology;  Laterality: Left;     Social History:  The patient  reports that he quit smoking about 26 years ago. His smoking use included cigarettes. He started smoking about 51 years ago. He has a 50 pack-year smoking history. He has never used smokeless tobacco. He reports that he does not drink alcohol and does not use drugs.   Family History:  The patient's family history includes Aneurysm (age of onset: 27) in his father; Stroke (age of onset: 70) in his mother.    ROS:  Please see the history of present illness. All other systems are reviewed and  Negative to the above problem except as noted.    PHYSICAL EXAM: VS:  BP 103/66   Pulse 75   Ht 5\' 9"  (1.753 m)   Wt 172 lb 3.2 oz (78.1 kg)   SpO2 97%   BMI 25.43 kg/m   GEN:  77 yo male in no acute distress  Neck: JVP is normal   No bruit Cardiac: RRR; No murmurs  No LE edema  Respiratory:  clear to auscultation bilaterally, GI: soft, nontender  No hepatomegaly   EKG:  EKG shows   SR   71 bpm  LVH  Possible IWMI Lipid Panel    Component Value Date/Time   CHOL 116 11/16/2021 0821   CHOL 124 04/19/2021 0720   TRIG 101.0 11/16/2021 0821   HDL 48.10 11/16/2021 0821   HDL 48 04/19/2021 0720   CHOLHDL 2 11/16/2021 0821   VLDL 20.2 11/16/2021 0821   LDLCALC 48 11/16/2021 0821   LDLCALC 55 04/19/2021 0720    CATH:  11/15/18  Left Main  Vessel  is large.  Dist LM lesion is 20% stenosed.  Left Anterior Descending  Vessel is moderate in size.  Ost LAD to Prox LAD  lesion is 35% stenosed. The lesion is moderately calcified.  Prox LAD to Mid LAD lesion is 45% stenosed. The lesion is eccentric.  First Diagonal Branch  Vessel is small in size.  Left Circumflex  Vessel is moderate in size.  Ost Cx to Prox Cx lesion is 40% stenosed.  Mid Cx to Dist Cx lesion is 70% stenosed.  First Obtuse Marginal Branch  Vessel is moderate in size.  Lateral First Obtuse Marginal Branch  Vessel is small in size.  Lat 1st Mrg lesion is 80% stenosed.  Second Obtuse Marginal Branch  Vessel is small in size.  2nd Mrg lesion is 70% stenosed.  Third Obtuse Marginal Branch  Vessel is small in size.  Right Coronary Artery  Vessel is small. Anterior takeoff.  Prox RCA lesion is 40% stenosed.     Wt Readings from Last 3 Encounters:  11/28/22 172 lb 3.2 oz (78.1 kg)  11/14/22 175 lb 9.6 oz (79.7 kg)  08/05/22 174 lb 9.6 oz (79.2 kg)      ASSESSMENT AND PLAN:  1 CAD Cath in 2020  Distal and small vessel dz in LCx, OM  Denies angina   Rx risk factors   2  HTN  Pt has some dizziness with activity, when bP is low   But BP is high at times   Recomm switch amlodipine to 2.5 mg bid   Follow     3  CV dz Hx of bilateral carotid artery stents   Keep on AsA and Plavix   4  L ICA aneurysm   s/p embolization   4  Lipids   Check lipomed, Apo B, Lpa      5  Metabolics    A1C 5.9 in the spring   Follow    Signed, Dietrich Pates, MD  11/28/2022 10:52 AM    [facility] [address] [place], [place], [place]  [address] Phone: [phone]; Fax: [phone]

## 2022-11-28 ENCOUNTER — Encounter: Payer: Self-pay | Admitting: Internal Medicine

## 2022-11-28 ENCOUNTER — Ambulatory Visit: Payer: Medicare HMO | Attending: Internal Medicine | Admitting: Internal Medicine

## 2022-11-28 VITALS — BP 103/66 | HR 75 | Ht 69.0 in | Wt 172.2 lb

## 2022-11-28 DIAGNOSIS — N400 Enlarged prostate without lower urinary tract symptoms: Secondary | ICD-10-CM

## 2022-11-28 DIAGNOSIS — R7303 Prediabetes: Secondary | ICD-10-CM | POA: Diagnosis not present

## 2022-11-28 DIAGNOSIS — I6523 Occlusion and stenosis of bilateral carotid arteries: Secondary | ICD-10-CM

## 2022-11-28 DIAGNOSIS — I25119 Atherosclerotic heart disease of native coronary artery with unspecified angina pectoris: Secondary | ICD-10-CM | POA: Diagnosis not present

## 2022-11-28 DIAGNOSIS — I10 Essential (primary) hypertension: Secondary | ICD-10-CM

## 2022-11-28 DIAGNOSIS — E785 Hyperlipidemia, unspecified: Secondary | ICD-10-CM | POA: Diagnosis not present

## 2022-11-28 MED ORDER — TAMSULOSIN HCL 0.4 MG PO CAPS: ORAL_CAPSULE | ORAL | Status: AC

## 2022-11-28 MED ORDER — AMLODIPINE BESYLATE 5 MG PO TABS: 2.5000 mg | ORAL_TABLET | Freq: Two times a day (BID) | ORAL | Status: DC

## 2022-11-28 NOTE — Patient Instructions (Addendum)
Medication Instructions:  Amlodipine 2.5 mg twice a day   *If you need a refill on your cardiac medications before your next appointment, please call your pharmacy*   Lab Work: Nmr, lipo a, apo b, cbc, bmet, hgba1c today  If you have labs (blood work) drawn today and your tests are completely normal, you will receive your results only by: MyChart Message (if you have MyChart) OR A paper copy in the mail If you have any lab test that is abnormal or we need to change your treatment, we will call you to review the results.   Testing/Procedures:    Follow-Up: At [facility], you and your health needs are our priority.  As part of our continuing mission to provide you with exceptional heart care, we have created designated Provider Care Teams.  These Care Teams include your primary Cardiologist (physician) and Advanced Practice Providers (APPs -  Physician Assistants and Nurse Practitioners) who all work together to provide you with the care you need, when you need it.  We recommend signing up for the patient portal called "MyChart".  Sign up information is provided on this After Visit Summary.  MyChart is used to connect with patients for Virtual Visits (Telemedicine).  Patients are able to view lab/test results, encounter notes, upcoming appointments, etc.  Non-urgent messages can be sent to your provider as well.   To learn more about what you can do with MyChart, go to [email or web address].     Other Instructions

## 2022-11-29 LAB — NMR, LIPOPROFILE
Cholesterol, Total: 130 mg/dL (ref 100–199)
HDL Particle Number: 34.2 umol/L (ref >=30.5)
HDL-C: 48 mg/dL (ref >39)
LDL Particle Number: 504 nmol/L (ref <1000)
LDL Size: 20.1 nm — ABNORMAL LOW (ref >20.5)
LDL-C (NIH Calc): 53 mg/dL (ref 0–99)
LP-IR Score: 47 — ABNORMAL HIGH (ref <=45)
Small LDL Particle Number: 328 nmol/L (ref <=527)
Triglycerides: 174 mg/dL — ABNORMAL HIGH (ref 0–149)

## 2022-11-29 LAB — CBC
Hematocrit: 42.8 % (ref 37.5–51.0)
Hemoglobin: 13.7 g/dL (ref 13.0–17.7)
MCH: 30.1 pg (ref 26.6–33.0)
MCHC: 32 g/dL (ref 31.5–35.7)
MCV: 94 fL (ref 79–97)
Platelets: 178 10*3/uL (ref 150–450)
RBC: 4.55 x10E6/uL (ref 4.14–5.80)
RDW: 12.9 % (ref 11.6–15.4)
WBC: 5.2 10*3/uL (ref 3.4–10.8)

## 2022-11-29 LAB — BASIC METABOLIC PANEL
BUN/Creatinine Ratio: 13 (ref 10–24)
BUN: 20 mg/dL (ref 8–27)
CO2: 24 mmol/L (ref 20–29)
Calcium: 9.7 mg/dL (ref 8.6–10.2)
Chloride: 102 mmol/L (ref 96–106)
Creatinine, Ser: 1.5 mg/dL — ABNORMAL HIGH (ref 0.76–1.27)
Glucose: 86 mg/dL (ref 70–99)
Potassium: 4.1 mmol/L (ref 3.5–5.2)
Sodium: 141 mmol/L (ref 134–144)
eGFR: 47 mL/min/{1.73_m2} — ABNORMAL LOW (ref >59)

## 2022-11-29 LAB — HEMOGLOBIN A1C
Est. average glucose Bld gHb Est-mCnc: 123 mg/dL
Hgb A1c MFr Bld: 5.9 % — ABNORMAL HIGH (ref 4.8–5.6)

## 2022-11-29 LAB — APOLIPOPROTEIN B: Apolipoprotein B: 58 mg/dL (ref <90)

## 2022-11-29 LAB — LIPOPROTEIN A (LPA): Lipoprotein (a): 34.9 nmol/L (ref <75.0)

## 2022-12-03 ENCOUNTER — Encounter: Payer: Self-pay | Admitting: Internal Medicine

## 2022-12-05 ENCOUNTER — Other Ambulatory Visit: Payer: Self-pay

## 2022-12-05 ENCOUNTER — Telehealth (HOSPITAL_COMMUNITY): Payer: Self-pay

## 2022-12-05 MED ORDER — AMLODIPINE BESYLATE 5 MG PO TABS: 2.5000 mg | ORAL_TABLET | Freq: Two times a day (BID) | ORAL | 3 refills | Status: DC

## 2022-12-05 NOTE — Telephone Encounter (Signed)
Called regarding recent imaging, no answer, left vm. AB

## 2022-12-06 ENCOUNTER — Telehealth (HOSPITAL_COMMUNITY): Payer: Self-pay

## 2022-12-06 NOTE — Telephone Encounter (Signed)
Pt agreed to f/u in 1 year with a cta head/neck. AB

## 2022-12-09 ENCOUNTER — Telehealth: Payer: Self-pay | Admitting: Internal Medicine

## 2022-12-09 MED ORDER — AMLODIPINE BESYLATE 2.5 MG PO TABS: 2.5000 mg | ORAL_TABLET | Freq: Every day | ORAL | 3 refills | Status: DC

## 2022-12-09 NOTE — Telephone Encounter (Signed)
Pt c/o medication issue:  1. Name of Medication:   amLODipine (NORVASC) 2.5 MG tablet    2. How are you currently taking this medication (dosage and times per day)? As written   3. Are you having a reaction (difficulty breathing--STAT)? No   4. What is your medication issue? Pharmacy wants to confirm instructions for this med. Pt states its twice daily but the directions say once daily.  Please advise.

## 2022-12-09 NOTE — Telephone Encounter (Signed)
Amlodipine 2.5 mg should be BID  Called and spoke with [place] at [facility] who is aware RX is supposed to be 2.5 mg twice a day. Updated profile and his medication list.

## 2022-12-22 ENCOUNTER — Other Ambulatory Visit: Payer: Self-pay | Admitting: Family

## 2022-12-22 ENCOUNTER — Other Ambulatory Visit: Payer: Self-pay | Admitting: Internal Medicine

## 2022-12-22 DIAGNOSIS — K2270 Barrett's esophagus without dysplasia: Secondary | ICD-10-CM

## 2022-12-28 ENCOUNTER — Ambulatory Visit: Payer: Medicare HMO | Admitting: Gastroenterology

## 2022-12-28 ENCOUNTER — Encounter: Payer: Self-pay | Admitting: Gastroenterology

## 2022-12-28 VITALS — BP 122/70 | HR 77 | Ht 69.0 in | Wt 177.0 lb

## 2022-12-28 DIAGNOSIS — Z9889 Other specified postprocedural states: Secondary | ICD-10-CM

## 2022-12-28 DIAGNOSIS — R1013 Epigastric pain: Secondary | ICD-10-CM

## 2022-12-28 DIAGNOSIS — K580 Irritable bowel syndrome with diarrhea: Secondary | ICD-10-CM

## 2022-12-28 DIAGNOSIS — Z95828 Presence of other vascular implants and grafts: Secondary | ICD-10-CM

## 2022-12-28 MED ORDER — RIFAXIMIN 550 MG PO TABS: 550.0000 mg | ORAL_TABLET | Freq: Three times a day (TID) | ORAL | 0 refills | Status: AC

## 2022-12-28 NOTE — Patient Instructions (Signed)
You have been scheduled for an endoscopy. Please follow written instructions given to you at your visit today.  If you use inhalers (even only as needed), please bring them with you on the day of your procedure.  If you take any of the following medications, they will need to be adjusted prior to your procedure:   DO NOT TAKE 7 DAYS PRIOR TO TEST- Trulicity (dulaglutide) Ozempic, Wegovy (semaglutide) Mounjaro (tirzepatide) Bydureon Bcise (exanatide extended release)  DO NOT TAKE 1 DAY PRIOR TO YOUR TEST Rybelsus (semaglutide) Adlyxin (lixisenatide) Victoza (liraglutide) Byetta (exanatide)  You will be contacted by our office prior to your procedure for directions on holding your Plavix.  If you do not hear from our office 1 week prior to your scheduled procedure, please call [phone] to discuss.   _______________________________________________________  If your blood pressure at your visit was 140/90 or greater, please contact your primary care physician to follow up on this.  _______________________________________________________  If you are age 78 or older, your body mass index should be between 23-30. Your Body mass index is 26.14 kg/m. If this is out of the aforementioned range listed, please consider follow up with your Primary Care Provider.  If you are age 41 or younger, your body mass index should be between 19-25. Your Body mass index is 26.14 kg/m. If this is out of the aformentioned range listed, please consider follow up with your Primary Care Provider.   ________________________________________________________  The [facility] providers would like to encourage you to use [facility] to communicate with providers for non-urgent requests or questions.  Due to long hold times on the telephone, sending your provider a message by [facility] may be a faster and more efficient way to get a response.  Please allow 48 business hours for a response.  Please remember that this is for  non-urgent requests.   It was a pleasure to see you today!  Thank you for trusting me with your gastrointestinal care!    Scott E.Tomasa Rand, MD

## 2022-12-28 NOTE — Progress Notes (Unsigned)
HPI : John Salazar is a 77 y.o. male who is referred to [place] by Mort Sawyers, FNP.    Discussed the use of AI scribe software for clinical note transcription with the patient, who gave verbal consent to proceed.  History of Present Illness   The patient, with a history of kidney stones and chronic back pain, presents with persistent gastrointestinal symptoms. Over the past two years, he has experienced bloating, gas, and foul-smelling flatulence, which have not improved. Recently, over the past three months, he has noticed a new symptom of pressure in the upper abdomen, described as a 'spongy' sensation, which he associates with gas. This discomfort has been severe enough to interrupt his exercise routine due to uncertainty about the cause.  The patient also reports a cyclical pattern of diarrhea, which he manages with Imodium approximately twice a week. However, he notes that when the diarrhea is controlled, the abdominal pressure and bloating worsen. He has tried various over-the-counter and prescription medications for these symptoms, including Tums, Rolaids, Prilosec, and currently Protonix, with limited relief. He also takes Pepcid as needed.  The patient has a history of carotid artery stents and a brain aneurysm stent, for which he takes aspirin and Plavix. He reports occasional lightheadedness, which he attributes to fluctuating blood pressure. He denies any new symptoms suggestive of further vascular narrowing. He expresses concern about the possibility of stomach cancer due to the chronicity of his symptoms, but also acknowledges a desire to understand and manage his symptoms better, particularly in social settings. He expresses a willingness to consider dietary changes and further diagnostic testing.       Taking Imodium twice a week Diarrhea    EGD 2016:  LA Grade A esophagitis, no evidence of Barrett's esophagus  Colonoscopy 2020:  Multiple benign rectal polyps, recommended  repeat in 5 years     Past Medical History:  Diagnosis Date   Anxiety    Ascending aorta dilation (HCC) 08/22/2021   Echocardiogram 04/2021: 40 mm   Barrett esophagus    BPH (benign prostatic hyperplasia)    Cerebral aneurysm    Chronic kidney disease    stage 2   Colon polyps    Coronary artery disease    mild-mod non-obstructive CAD   Erectile dysfunction    GERD (gastroesophageal reflux disease)    History of kidney stones    History of stomach ulcers    Hypercholesteremia    Hypertension    Positive TB test    in the past     Past Surgical History:  Procedure Laterality Date   APPENDECTOMY  1970   CATARACT EXTRACTION W/ INTRAOCULAR LENS  IMPLANT, BILATERAL     COLONOSCOPY  10/2012   ESOPHAGOGASTRODUODENOSCOPY ENDOSCOPY  10/2012   HERNIA REPAIR     IR 3D INDEPENDENT WKST  04/14/2020   IR ANGIO INTRA EXTRACRAN SEL COM CAROTID INNOMINATE BILAT MOD SED  08/09/2017   IR ANGIO INTRA EXTRACRAN SEL COM CAROTID INNOMINATE BILAT MOD SED  11/08/2018   IR ANGIO INTRA EXTRACRAN SEL COM CAROTID INNOMINATE BILAT MOD SED  04/14/2020   IR ANGIO INTRA EXTRACRAN SEL COM CAROTID INNOMINATE UNI L MOD SED  06/29/2020   IR ANGIO INTRA EXTRACRAN SEL INTERNAL CAROTID UNI L MOD SED  05/20/2020   IR ANGIO VERTEBRAL SEL VERTEBRAL BILAT MOD SED  08/09/2017   IR ANGIO VERTEBRAL SEL VERTEBRAL BILAT MOD SED  11/08/2018   IR ANGIO VERTEBRAL SEL VERTEBRAL BILAT MOD SED  04/14/2020  IR ANGIOGRAM FOLLOW UP STUDY  05/20/2020   IR INTRAVSC STENT CERV CAROTID W/EMB-PROT MOD SED INCL ANGIO  08/23/2017   IR INTRAVSC STENT CERV CAROTID W/EMB-PROT MOD SED INCL ANGIO  06/29/2020   IR RADIOLOGIST EVAL & MGMT  05/05/2020   IR RADIOLOGIST EVAL & MGMT  06/03/2020   IR RADIOLOGIST EVAL & MGMT  07/15/2020   IR TRANSCATH/EMBOLIZ  05/18/2020   IR US GUIDE VASC ACCESS RIGHT  04/14/2020   IR US GUIDE VASC ACCESS RIGHT  05/20/2020   IR US GUIDE VASC ACCESS RIGHT  06/29/2020   LEFT HEART CATH AND CORONARY ANGIOGRAPHY N/A 11/15/2018    Procedure: LEFT HEART CATH AND CORONARY ANGIOGRAPHY;  Surgeon: Yvonne Kendall, MD;  [facility];  Service: Cardiovascular;  Laterality: N/A;   PROSTATE SURGERY  1999   20 years ago, enlarged prostate   RADIOLOGY WITH ANESTHESIA N/A 08/23/2017   Procedure: IR WITH ANESTHESIA STENT PLACEMENT;  Surgeon: Julieanne Cotton, MD;  Location: [facility] OR;  Service: Radiology;  Laterality: N/A;   RADIOLOGY WITH ANESTHESIA N/A 05/18/2020   Procedure: IR WITH ANESTHESIA EMBOLIZATION;  Surgeon: Julieanne Cotton, MD;  Location: [facility];  Service: Radiology;  Laterality: N/A;   RADIOLOGY WITH ANESTHESIA Left 06/29/2020   Procedure: RADIOLOGY WITH ANESTHESIA  LEFT CAROTID STENT PLACEMENT;  Surgeon: Julieanne Cotton, MD;  Location: [facility];  Service: Radiology;  Laterality: Left;   Family History  Problem Relation Age of Onset   Stroke Mother 16   Aneurysm Father 41   Social History   Tobacco Use   Smoking status: Former    Current packs/day: 0.00    Average packs/day: 2.0 packs/day for 25.0 years (50.0 ttl pk-yrs)    Types: Cigarettes    Start date: 03/11/1971    Quit date: 03/10/1996    Years since quitting: 26.8   Smokeless tobacco: Never  Vaping Use   Vaping status: Never Used  Substance Use Topics   Alcohol use: No    Alcohol/week: 0.0 standard drinks of alcohol    Comment: in the past drank, quit 1993   Drug use: No   Current Outpatient Medications  Medication Sig Dispense Refill   amLODipine (NORVASC) 2.5 MG tablet Take 1 tablet (2.5 mg total) by mouth daily. (Patient taking differently: Take 2.5 mg by mouth 2 (two) times daily.) 180 tablet 3   aspirin EC 81 MG tablet Take 1 tablet (81 mg total) by mouth daily. Swallow whole. 90 tablet 3   clopidogrel (PLAVIX) 75 MG tablet TAKE ONE TABLET BY MOUTH DAILY 90 tablet 3   escitalopram (LEXAPRO) 20 MG tablet Take 1 tablet (20 mg total) by mouth daily. 90 tablet 3   famotidine (PEPCID) 20 MG tablet Take 20 mg by mouth daily as needed  for heartburn or indigestion.     losartan (COZAAR) 50 MG tablet TAKE ONE TABLET BY MOUTH DAILY 90 tablet 3   nitroGLYCERIN (NITROSTAT) 0.4 MG SL tablet Place 1 tablet (0.4 mg total) under the tongue every 5 (five) minutes as needed for chest pain. 25 tablet prn   pantoprazole (PROTONIX) 40 MG tablet Take 1 tablet (40 mg total) by mouth daily. 90 tablet 0   rosuvastatin (CRESTOR) 40 MG tablet Take one (1) tablet by mouth (40 mg) daily. 90 tablet 3   tamsulosin (FLOMAX) 0.4 MG CAPS capsule Take one capsule once daily ( thirty minutes after same meal daily)     No current facility-administered medications for this visit.   Allergies  Allergen Reactions   Bee Venom Anaphylaxis and Shortness Of Breath   Penicillins Rash    PATIENT HAS HAD A PCN REACTION WITH IMMEDIATE RASH, FACIAL/TONGUE/THROAT SWELLING, SOB, OR LIGHTHEADEDNESS WITH HYPOTENSION:  #  #  YES  #  #  Has patient had a PCN reaction causing severe rash involving mucus membranes or skin necrosis: no Has patient had a PCN reaction that required hospitalization: no Has patient had a PCN reaction occurring within the last 10 years: no      Review of Systems: All systems reviewed and negative except where noted in HPI.    No results found.  Physical Exam: BP 122/70   Pulse 77   Ht 5\' 9"  (1.753 m)   Wt 177 lb (80.3 kg)   BMI 26.14 kg/m  Constitutional: Pleasant,well-developed, ***male in no acute distress. HEENT: Normocephalic and atraumatic. Conjunctivae are normal. No scleral icterus. Neck supple.  Cardiovascular: Normal rate, regular rhythm.  Pulmonary/chest: Effort normal and breath sounds normal. No wheezing, rales or rhonchi. Abdominal: Soft, nondistended, nontender. Bowel sounds active throughout. There are no masses palpable. No hepatomegaly. Extremities: no edema Lymphadenopathy: No cervical adenopathy noted. Neurological: Alert and oriented to person place and time. Skin: Skin is warm and dry. No rashes  noted. Psychiatric: Normal mood and affect. Behavior is normal.  CBC    Component Value Date/Time   WBC 5.2 11/28/2022 1130   WBC 4.8 05/23/2022 0858   RBC 4.55 11/28/2022 1130   RBC 4.59 05/23/2022 0858   HGB 13.7 11/28/2022 1130   HCT 42.8 11/28/2022 1130   PLT 178 11/28/2022 1130   MCV 94 11/28/2022 1130   MCH 30.1 11/28/2022 1130   MCH 29.8 01/15/2022 1000   MCHC 32.0 11/28/2022 1130   MCHC 33.4 05/23/2022 0858   RDW 12.9 11/28/2022 1130   LYMPHSABS 1.1 05/23/2022 0858   MONOABS 0.6 05/23/2022 0858   EOSABS 0.2 05/23/2022 0858   BASOSABS 0.0 05/23/2022 0858    CMP     Component Value Date/Time   NA 141 11/28/2022 1130   K 4.1 11/28/2022 1130   CL 102 11/28/2022 1130   CO2 24 11/28/2022 1130   GLUCOSE 86 11/28/2022 1130   GLUCOSE 89 05/23/2022 0858   BUN 20 11/28/2022 1130   CREATININE 1.50 (H) 11/28/2022 1130   CALCIUM 9.7 11/28/2022 1130   PROT 6.7 01/15/2022 1000   ALBUMIN 3.7 01/15/2022 1000   AST 21 01/15/2022 1000   ALT 19 01/15/2022 1000   ALKPHOS 70 01/15/2022 1000   BILITOT 0.7 01/15/2022 1000   GFRNONAA 34 (L) 01/15/2022 1000   GFRAA 52 (L) 01/13/2020 1138       Latest Ref Rng & Units 11/28/2022   11:30 AM 05/23/2022    8:58 AM 01/15/2022   10:00 AM  CBC EXTENDED  WBC 3.4 - 10.8 x10E3/uL 5.2  4.8  5.1   RBC 4.14 - 5.80 x10E6/uL 4.55  4.59  4.33   Hemoglobin 13.0 - 17.7 g/dL 13.0  86.5  78.4   HCT 37.5 - 51.0 % 42.8  41.0  39.7   Platelets 150 - 450 x10E3/uL 178  171.0  158   NEUT# 1.4 - 7.7 K/uL  2.8  3.2   Lymph# 0.7 - 4.0 K/uL  1.1  1.1       ASSESSMENT AND PLAN:  Mort Sawyers, FNP

## 2022-12-29 ENCOUNTER — Telehealth: Payer: Self-pay

## 2022-12-29 NOTE — Telephone Encounter (Signed)
Pharmacy Patient Advocate Encounter   Received notification from Pt Calls Messages that prior authorization for Xifaxan 550 mg tablets is required/requested.   Insurance verification completed.   The patient is insured through [facility] .   Per test claim: PA required; PA submitted to above mentioned insurance via CoverMyMeds Key/confirmation #/EOC [id number] Status is pending

## 2023-01-08 ENCOUNTER — Encounter: Payer: Self-pay | Admitting: Gastroenterology

## 2023-01-09 ENCOUNTER — Telehealth: Payer: Self-pay

## 2023-01-09 DIAGNOSIS — H52223 Regular astigmatism, bilateral: Secondary | ICD-10-CM | POA: Diagnosis not present

## 2023-01-09 DIAGNOSIS — H35033 Hypertensive retinopathy, bilateral: Secondary | ICD-10-CM | POA: Diagnosis not present

## 2023-01-09 DIAGNOSIS — H532 Diplopia: Secondary | ICD-10-CM | POA: Diagnosis not present

## 2023-01-09 DIAGNOSIS — H5051 Esophoria: Secondary | ICD-10-CM | POA: Diagnosis not present

## 2023-01-09 DIAGNOSIS — H35373 Puckering of macula, bilateral: Secondary | ICD-10-CM | POA: Diagnosis not present

## 2023-01-09 DIAGNOSIS — H524 Presbyopia: Secondary | ICD-10-CM | POA: Diagnosis not present

## 2023-01-09 NOTE — Telephone Encounter (Signed)
[facility] Pre-operative Risk Assessment     Request for surgical clearance:     Endoscopy Procedure  What type of surgery is being performed?     Endoscopy  When is this surgery scheduled?     01/23/23  What type of clearance is required ?   Pharmacy  Are there any medications that need to be held prior to surgery and how long? Plavix & 5 days  Practice name and name of physician performing surgery?      [facility]  What is your office phone and fax number?      Phone- [phone]  Fax- [phone]  Anesthesia type (None, local, MAC, general) ?       MAC   Please route your response to Chasitee Zenker, CMA

## 2023-01-09 NOTE — Telephone Encounter (Signed)
Good afternoon. We did not hear back yet but I will contact you once we do hear back from Dr.Ross.

## 2023-01-09 NOTE — Telephone Encounter (Addendum)
   Patient Name: John Salazar  DOB: 1945/03/01 MRN: [id number]  Primary Cardiologist: Dietrich Pates, MD  Chart reviewed as part of pre-operative protocol coverage. Given past medical history and time since last visit, based on ACC/AHA guidelines, John Salazar is at acceptable risk for the planned procedure without further cardiovascular testing.   Request to hold Plavix and ASA will need to be addressed by interventional radiology given history of carotid artery stents.   I will route this recommendation to the requesting party via Epic fax function and remove from pre-op pool.  Please call with questions.  Denyce Robert, NP 01/09/2023, 2:12 PM

## 2023-01-12 ENCOUNTER — Encounter: Payer: Self-pay | Admitting: Gastroenterology

## 2023-01-12 NOTE — Telephone Encounter (Signed)
Pharmacy Patient Advocate Encounter  Received notification from [employer] that Prior Authorization for Xifaxan 550mg  tablets has been APPROVED from 05/09/22 to 01/12/23  Medication was dispensed 11/22

## 2023-01-19 ENCOUNTER — Telehealth (HOSPITAL_COMMUNITY): Payer: Self-pay

## 2023-01-19 NOTE — Telephone Encounter (Signed)
Contacted patient and let him know that Plavix clearance has not been received but a not has been sent to [facility] office. Patient gave me his contact from [facility]'s number [phone]. Morrie Sheldon stated that she would send the PA a note and call back.

## 2023-01-19 NOTE — Telephone Encounter (Signed)
Spoke to [employer], [place] regarding pt holding Plavix prior to endoscopy on 12/16. Per Dr. Corliss Skains, Yes, okay to hold. He is a couple years out from his stents and his last CTA was good. Need to resume as soon as safely possible after his procedure

## 2023-01-19 NOTE — Telephone Encounter (Signed)
Received verbal from Dr.Deveshwar's office stating that patient can hold Plavix's 5 days prior to his procedure and to resume after the procedure. Patient contacted and notified him to hold Plavix 5 days prior to procedure.

## 2023-01-19 NOTE — Telephone Encounter (Signed)
Please advise 

## 2023-01-19 NOTE — Telephone Encounter (Signed)
Inbound call from patient requesting an update for holding Plavix. Please advise, thank you.

## 2023-01-23 ENCOUNTER — Encounter: Payer: Self-pay | Admitting: Gastroenterology

## 2023-01-23 ENCOUNTER — Ambulatory Visit: Payer: Medicare HMO | Admitting: Gastroenterology

## 2023-01-23 VITALS — BP 106/57 | HR 83 | Temp 98.2°F | Resp 16 | Ht 69.0 in | Wt 177.0 lb

## 2023-01-23 DIAGNOSIS — K259 Gastric ulcer, unspecified as acute or chronic, without hemorrhage or perforation: Secondary | ICD-10-CM

## 2023-01-23 DIAGNOSIS — K449 Diaphragmatic hernia without obstruction or gangrene: Secondary | ICD-10-CM | POA: Diagnosis not present

## 2023-01-23 DIAGNOSIS — K580 Irritable bowel syndrome with diarrhea: Secondary | ICD-10-CM | POA: Diagnosis not present

## 2023-01-23 DIAGNOSIS — K2100 Gastro-esophageal reflux disease with esophagitis, without bleeding: Secondary | ICD-10-CM | POA: Diagnosis not present

## 2023-01-23 DIAGNOSIS — K2970 Gastritis, unspecified, without bleeding: Secondary | ICD-10-CM

## 2023-01-23 DIAGNOSIS — K319 Disease of stomach and duodenum, unspecified: Secondary | ICD-10-CM

## 2023-01-23 MED ORDER — PANTOPRAZOLE SODIUM 40 MG PO TBEC: 40.0000 mg | DELAYED_RELEASE_TABLET | Freq: Two times a day (BID) | ORAL | 3 refills | Status: DC

## 2023-01-23 MED ORDER — SODIUM CHLORIDE 0.9 % IV SOLN: 500.0000 mL | INTRAVENOUS | Status: DC

## 2023-01-23 NOTE — Progress Notes (Signed)
Called to room to assist during endoscopic procedure.  Patient ID and intended procedure confirmed with present staff. Received instructions for my participation in the procedure from the performing physician.  

## 2023-01-23 NOTE — Progress Notes (Signed)
To pacu, VSS. Report to [email or web address] 

## 2023-01-23 NOTE — Progress Notes (Signed)
Pt's states no medical or surgical changes since previsit or office visit. 

## 2023-01-23 NOTE — Op Note (Addendum)
[facility] Patient Name: John Salazar Procedure Date: 01/23/2023 10:17 AM MRN: [id number] Endoscopist: Lorin Picket E. Tomasa Rand , MD, [id number] Age: 77 Referring MD:  Date of Birth: [birth year] Gender: Male Account #: [id number] Procedure:                Upper GI endoscopy Indications:              Epigastric abdominal pain, Abdominal bloating Medicines:                Monitored Anesthesia Care Procedure:                Pre-Anesthesia Assessment:                           - Prior to the procedure, a History and Physical                            was performed, and patient medications and                            allergies were reviewed. The patient's tolerance of                            previous anesthesia was also reviewed. The risks                            and benefits of the procedure and the sedation                            options and risks were discussed with the patient.                            All questions were answered, and informed consent                            was obtained. Prior Anticoagulants: The patient has                            taken Plavix (clopidogrel), last dose was 5 days                            prior to procedure. ASA Grade Assessment: III - A                            patient with severe systemic disease. After                            reviewing the risks and benefits, the patient was                            deemed in satisfactory condition to undergo the                            procedure.  After obtaining informed consent, the endoscope was                            passed under direct vision. Throughout the                            procedure, the patient's blood pressure, pulse, and                            oxygen saturations were monitored continuously. The                            GIF [id number] #[id number] was introduced through the                            mouth, and advanced to the  second part of duodenum.                            The upper GI endoscopy was accomplished without                            difficulty. The patient tolerated the procedure                            well. Scope In: Scope Out: Findings:                 The examined portions of the nasopharynx,                            oropharynx and larynx were normal.                           LA Grade A (one or more mucosal breaks less than 5                            mm, not extending between tops of 2 mucosal folds)                            esophagitis was found.                           The exam of the esophagus was otherwise normal.                           A small hiatal hernia was present.                           A single 3 mm erosion with no bleeding and no                            stigmata of recent bleeding was found in the                            gastric  body. Biopsies were taken with a cold                            forceps for Helicobacter pylori testing. Estimated                            blood loss was minimal.                           The exam of the stomach was otherwise normal.                           Biopsies were taken with a cold forceps in the                            gastric antrum for Helicobacter pylori testing.                            Estimated blood loss was minimal.                           The examined duodenum was normal. Complications:            No immediate complications. Estimated Blood Loss:     Estimated blood loss was minimal. Impression:               - The examined portions of the nasopharynx,                            oropharynx and larynx were normal.                           - LA Grade A reflux esophagitis.                           - Small hiatal hernia.                           - Erosive gastropathy with no bleeding and no                            stigmata of recent bleeding. Biopsied.                           - Normal  examined duodenum.                           - Biopsies were taken with a cold forceps for                            Helicobacter pylori testing. Recommendation:           - Patient has a contact number available for                            emergencies. The signs and symptoms of potential  delayed complications were discussed with the                            patient. Return to normal activities tomorrow.                            Written discharge instructions were provided to the                            patient.                           - Resume previous diet.                           - Resume Plavix (clopidogrel) at prior dose                            tomorrow.                           - Await pathology results.                           - Increase Pantoprazole to 40 mg PO twice a day [name] E. Tomasa Rand, MD 01/23/2023 10:40:22 AM This report has been signed electronically.

## 2023-01-23 NOTE — Progress Notes (Signed)
History and Physical Interval Note:  01/23/2023 10:16 AM  John Salazar  has presented today for endoscopic procedure(s), with the diagnosis of  Encounter Diagnosis  Name Primary?   Irritable bowel syndrome with diarrhea Yes  .  The various methods of evaluation and treatment have been discussed with the patient and/or family. After consideration of risks, benefits and other options for treatment, the patient has consented to  the endoscopic procedure(s).   The patient's history has been reviewed, patient examined, no change in status, stable for endoscopic procedure(s).  I have reviewed the patient's chart and labs.  Questions were answered to the patient's satisfaction.     Alexxa Sabet E. Tomasa Rand, MD [facility]

## 2023-01-23 NOTE — Patient Instructions (Signed)
Please read handouts provided. Resume previous diet. Resume Plavix ( clopidogrel ) at prior dose tomorrow. Await pathology results. Increase pantoprazole to 40 mg twice daily.   YOU HAD AN ENDOSCOPIC PROCEDURE TODAY AT THE [facility]:   Refer to the procedure report that was given to you for any specific questions about what was found during the examination.  If the procedure report does not answer your questions, please call your gastroenterologist to clarify.  If you requested that your care partner not be given the details of your procedure findings, then the procedure report has been included in a sealed envelope for you to review at your convenience later.  YOU SHOULD EXPECT: Some feelings of bloating in the abdomen. Passage of more gas than usual.  Walking can help get rid of the air that was put into your GI tract during the procedure and reduce the bloating. If you had a lower endoscopy (such as a colonoscopy or flexible sigmoidoscopy) you may notice spotting of blood in your stool or on the toilet paper. If you underwent a bowel prep for your procedure, you may not have a normal bowel movement for a few days.  Please Note:  You might notice some irritation and congestion in your nose or some drainage.  This is from the oxygen used during your procedure.  There is no need for concern and it should clear up in a day or so.  SYMPTOMS TO REPORT IMMEDIATELY:   Following upper endoscopy (EGD)  Vomiting of blood or coffee ground material  New chest pain or pain under the shoulder blades  Painful or persistently difficult swallowing  New shortness of breath  Fever of 100F or higher  Black, tarry-looking stools  For urgent or emergent issues, a gastroenterologist can be reached at any hour by calling [phone]. Do not use MyChart messaging for urgent concerns.    DIET:  We do recommend a small meal at first, but then you may proceed to your regular diet.  Drink plenty  of fluids but you should avoid alcoholic beverages for 24 hours.  ACTIVITY:  You should plan to take it easy for the rest of today and you should NOT DRIVE or use heavy machinery until tomorrow (because of the sedation medicines used during the test).    FOLLOW UP: Our staff will call the number listed on your records the next business day following your procedure.  We will call around 7:15- 8:00 am to check on you and address any questions or concerns that you may have regarding the information given to you following your procedure. If we do not reach you, we will leave a message.     If any biopsies were taken you will be contacted by phone or by letter within the next 1-3 weeks.  Please call us at [phone] if you have not heard about the biopsies in 3 weeks.    SIGNATURES/CONFIDENTIALITY: You and/or your care partner have signed paperwork which will be entered into your electronic medical record.  These signatures attest to the fact that that the information above on your After Visit Summary has been reviewed and is understood.  Full responsibility of the confidentiality of this discharge information lies with you and/or your care-partner.

## 2023-01-24 ENCOUNTER — Telehealth: Payer: Self-pay

## 2023-01-24 NOTE — Telephone Encounter (Signed)
  Follow up Call-     01/23/2023    9:36 AM  Call back number  Post procedure Call Back phone  # [phone]  Permission to leave phone message Yes     Patient questions:  Do you have a fever, pain , or abdominal swelling? No. Pain Score  0 *  Have you tolerated food without any problems? Yes.    Have you been able to return to your normal activities? Yes.    Do you have any questions about your discharge instructions: Diet   No. Medications  No. Follow up visit  No.  Do you have questions or concerns about your Care? No.  Actions: * If pain score is 4 or above: No action needed, pain <4.

## 2023-01-25 LAB — SURGICAL PATHOLOGY

## 2023-01-26 NOTE — Progress Notes (Signed)
Mr. Cavataio,  The biopsies taken from your stomach were notable for mild reactive gastropathy which is a common finding and often related to use of certain medications (usually NSAIDs), but there was no evidence of Helicobacter pylori infection. This common finding is not felt to necessarily be a cause of any particular symptom and there is no specific treatment or further evaluation recommended.  Your symptoms may be from undertreated acid reflux.  Please take the Protonix twice daily for a month and see if this improves your symptoms.  I would then try to decrease your dose to once daily (goal is lowest effective dose to control symptoms).

## 2023-02-03 ENCOUNTER — Telehealth: Payer: Self-pay | Admitting: Internal Medicine

## 2023-02-03 ENCOUNTER — Ambulatory Visit (HOSPITAL_BASED_OUTPATIENT_CLINIC_OR_DEPARTMENT_OTHER): Payer: Medicare HMO | Admitting: Family

## 2023-02-03 ENCOUNTER — Encounter (HOSPITAL_BASED_OUTPATIENT_CLINIC_OR_DEPARTMENT_OTHER): Payer: Self-pay | Admitting: Family

## 2023-02-03 VITALS — BP 122/70 | Ht 69.0 in | Wt 175.5 lb

## 2023-02-03 DIAGNOSIS — E785 Hyperlipidemia, unspecified: Secondary | ICD-10-CM

## 2023-02-03 DIAGNOSIS — I10 Essential (primary) hypertension: Secondary | ICD-10-CM

## 2023-02-03 DIAGNOSIS — I25118 Atherosclerotic heart disease of native coronary artery with other forms of angina pectoris: Secondary | ICD-10-CM | POA: Diagnosis not present

## 2023-02-03 MED ORDER — LOSARTAN POTASSIUM 50 MG PO TABS: 25.0000 mg | ORAL_TABLET | Freq: Every day | ORAL | Status: DC

## 2023-02-03 NOTE — Telephone Encounter (Signed)
   Pt c/o of Chest Pain: STAT if active CP, including tightness, pressure, jaw pain, radiating pain to shoulder/upper arm/back, CP unrelieved by Nitro. Symptoms reported of SOB, nausea, vomiting, sweating.  1. Are you having CP right now? Neck pain at this time    2. Are you experiencing any other symptoms (ex. SOB, nausea, vomiting, sweating)? Not at this time- he have had neck pain, shoulder pain, arm pain and back pain,- some tightness in his chest- but not at this time   3. Is your CP continuous or coming and going? Comes and goes   4. Have you taken Nitroglycerin? no   5. How long have you been experiencing CP? Sunday  - I made an appointment for today with [facility]- please call to evaluate  6. If NO CP at time of call then end call with telling Pt to call back or call 911 if Chest pain returns prior to return call from triage team.

## 2023-02-03 NOTE — Telephone Encounter (Signed)
Call to patient to evaluate symptoms. Patient states he had brief episodes of chest pain/SOB on exertion while walking his dogs on Monday/Tuesday.  He states at first he thought it was gas.  He reports chest pain/SOB resolved with rest and that he did not take any nitroglycerin, instead he took "half a tablet of some old isosorbide mononitrate I had laying around".  He has not had any chest pain or needed to take any nitroglycerin or isosorbide since. He does not check his HR or BP at home. He says he does notice some neck, shoulder, and wrist pain today but denies any falls, strains or injuries. He states he is currently "driving around town, running errands, getting his car looked at".   Advised patient to keep appt w/Caitlin Walker at [facility] today, but also to call 911 immediately if chest pain/sob return. Also advised patient to take nitroglycerin up to 3 doses while waiting for EMS if chest pain/sob return.  Patient verbalized understanding.

## 2023-02-03 NOTE — Patient Instructions (Signed)
Medication Instructions:  Your physician has recommended you make the following change in your medication:  START Losartan - take half a tablet RESUME Amlodipine 2.5 mg daily  *If you need a refill on your cardiac medications before your next appointment, please call your pharmacy*   Follow-Up: At [facility], you and your health needs are our priority.  As part of our continuing mission to provide you with exceptional heart care, we have created designated Provider Care Teams.  These Care Teams include your primary Cardiologist (physician) and Advanced Practice Providers (APPs -  Physician Assistants and Nurse Practitioners) who all work together to provide you with the care you need, when you need it.  We recommend signing up for the patient portal called "MyChart".  Sign up information is provided on this After Visit Summary.  MyChart is used to connect with patients for Virtual Visits (Telemedicine).  Patients are able to view lab/test results, encounter notes, upcoming appointments, etc.  Non-urgent messages can be sent to your provider as well.   To learn more about what you can do with MyChart, go to [email or web address].    Your next appointment:   Follow up as scheduled with Dr. Tenny Craw in January 2025

## 2023-02-03 NOTE — Progress Notes (Unsigned)
Cardiology Office Note:  .   Date:  02/04/2023  ID:  John Salazar, DOB 23-Apr-1945, MRN [id number] PCP: Mort Sawyers, FNP  [facility] Providers Cardiologist:  Dietrich Pates, MD    History of Present Illness: .   John Salazar is a 77 y.o. male with history of CAD, HTN, and carotid artery disease (s/p PTA/stent 2019, 2022), GERD, etoh use.  In 2020 LHC with moderate CAD and one tight OM lesion recommended to continue medical Rx. Last seen 11/28/22 doing overall well. He had some dizziness and Amlodipine 2.5mg  was adjusted to 2.5mg  BID.   Presents today after contacting the office noting chest pain. Notes having a couple episodes of chest pain while walking his dog last week with his wife also associated with some exertional dyspnea. Has also had some back and right shoulder'neck pain which has been persistent but improves with Tylenol and not associated with chest pain. No recent injury, heavy lifting. Occasional episode of chest pain while sitting particularly if stretching. Due to dizziness with Losartan and Amlodipine most days he has just been taking his Losartan.   ROS: Please see the history of present illness.    All other systems reviewed and are negative.   Studies Reviewed: Marland Kitchen   EKG Interpretation Date/Time:  Friday February 03 2023 14:05:50 EST Ventricular Rate:  72 PR Interval:  138 QRS Duration:  82 QT Interval:  402 QTC Calculation: 440 R Axis:   -8  Text Interpretation: Normal sinus rhythm Normal ECG Confirmed by Gillian Shields ([id number]) on 02/03/2023 2:41:33 PM    Cardiac Studies & Procedures   CARDIAC CATHETERIZATION  CARDIAC CATHETERIZATION 11/15/2018  Narrative Conclusions: 1. Severe single-vessel coronary artery disease involving a branch of OM1 (80% ostial stenosis) and distal LCx/OM2 (70%).  The affected vessels/branches are small and not well-suited to PCI (vessel size 2 mm or less). 2. Mild to moderate, non-obstructive coronary artery disease  involving the LMCA, LAD, and RCA. 3. Normal left ventricular filling pressure.  Recommendations: 1. Aggressive medical therapy and secondary prevention.  I will add isosorbide mononitrate 15 mg daily, to be escalated as tolerated. 2. Continue antiplatelet and statin therapy. 3. Close outpatient follow-up with Dr. Quentin Angst.  Yvonne Kendall, MD [facility] Pager: [phone]  Findings Coronary Findings Diagnostic  Dominance: Right  Left Main Vessel is large. Dist LM lesion is 20% stenosed.  Left Anterior Descending Vessel is moderate in size. Ost LAD to Prox LAD lesion is 35% stenosed. The lesion is moderately calcified. Prox LAD to Mid LAD lesion is 45% stenosed. The lesion is eccentric.  First Diagonal Branch Vessel is small in size.  Left Circumflex Vessel is moderate in size. Ost Cx to Prox Cx lesion is 40% stenosed. Mid Cx to Dist Cx lesion is 70% stenosed.  First Obtuse Marginal Branch Vessel is moderate in size.  Lateral First Obtuse Marginal Branch Vessel is small in size. Lat 1st Mrg lesion is 80% stenosed.  Second Obtuse Marginal Branch Vessel is small in size. 2nd Mrg lesion is 70% stenosed.  Third Obtuse Marginal Branch Vessel is small in size.  Right Coronary Artery Vessel is small. Anterior takeoff. Prox RCA lesion is 40% stenosed.  Intervention  No interventions have been documented.    ECHOCARDIOGRAM  ECHOCARDIOGRAM COMPLETE 05/06/2021  Narrative ECHOCARDIOGRAM REPORT    Patient Name:   John Salazar Date of Exam: 05/06/2021 Medical Rec #:  [id number]      Height:       69.0  in Accession #:    [id number]     Weight:       171.4 lb Date of Birth:  1945/05/20      BSA:          1.935 m Patient Age:    75 years       BP:           120/76 mmHg Patient Gender: M              HR:           86 bpm. Exam Location:  [address]  Procedure: 2D Echo, Color Doppler and Cardiac Doppler  Indications:    I25.1 CAD  History:         Patient has no prior history of Echocardiogram examinations. CAD, Carotid Disease; Risk Factors:Hypertension, Dyslipidemia and Former Smoker. CKD stage 3. Anemia. Pre-diabetes. EtOH.  Sonographer:    Jorje Guild BS, RDCS Referring Phys: [id number] TESSA N CONTE  IMPRESSIONS   1. Left ventricular ejection fraction, by estimation, is 60 to 65%. The left ventricle has normal function. The left ventricle has no regional wall motion abnormalities. Left ventricular diastolic parameters were normal. 2. Right ventricular systolic function is normal. The right ventricular size is normal. There is normal pulmonary artery systolic pressure. The estimated right ventricular systolic pressure is 26.0 mmHg. 3. The mitral valve is normal in structure. No evidence of mitral valve regurgitation. No evidence of mitral stenosis. 4. The aortic valve is tricuspid. There is mild calcification of the aortic valve. Aortic valve regurgitation is trivial. No aortic stenosis is present. 5. Aortic dilatation noted. There is mild dilatation of the ascending aorta, measuring 40 mm.  Comparison(s): No prior Echocardiogram.  Conclusion(s)/Recommendation(s): Otherwise normal echocardiogram, with minor abnormalities described in the report. Mildly dilated ascending aorta (40 mm).  FINDINGS Left Ventricle: Left ventricular ejection fraction, by estimation, is 60 to 65%. The left ventricle has normal function. The left ventricle has no regional wall motion abnormalities. The left ventricular internal cavity size was normal in size. There is no left ventricular hypertrophy. Left ventricular diastolic parameters were normal.  Right Ventricle: The right ventricular size is normal. No increase in right ventricular wall thickness. Right ventricular systolic function is normal. There is normal pulmonary artery systolic pressure. The tricuspid regurgitant velocity is 2.12 m/s, and with an assumed right atrial pressure of 8 mmHg, the  estimated right ventricular systolic pressure is 26.0 mmHg.  Left Atrium: Left atrial size was normal in size.  Right Atrium: Right atrial size was normal in size.  Pericardium: There is no evidence of pericardial effusion.  Mitral Valve: The mitral valve is normal in structure. There is mild thickening of the mitral valve leaflet(s). There is mild calcification of the mitral valve leaflet(s). No evidence of mitral valve regurgitation. No evidence of mitral valve stenosis.  Tricuspid Valve: The tricuspid valve is normal in structure. Tricuspid valve regurgitation is trivial. No evidence of tricuspid stenosis.  Aortic Valve: The aortic valve is tricuspid. There is mild calcification of the aortic valve. Aortic valve regurgitation is trivial. No aortic stenosis is present.  Pulmonic Valve: The pulmonic valve was not well visualized. Pulmonic valve regurgitation is trivial. No evidence of pulmonic stenosis.  Aorta: Aortic dilatation noted. There is mild dilatation of the ascending aorta, measuring 40 mm.  Venous: The inferior vena cava was not well visualized.  IAS/Shunts: No atrial level shunt detected by color flow Doppler.   LEFT VENTRICLE PLAX 2D LVIDd:  3.60 cm   Diastology LVIDs:         2.30 cm   LV e' medial:    5.82 cm/s LV PW:         0.90 cm   LV E/e' medial:  8.4 LV IVS:        0.90 cm   LV e' lateral:   6.76 cm/s LVOT diam:     2.30 cm   LV E/e' lateral: 7.2 LV SV:         70 LV SV Index:   36 LVOT Area:     4.15 cm   RIGHT VENTRICLE             IVC RV Basal diam:  3.60 cm     IVC diam: 1.90 cm RV S prime:     12.90 cm/s TAPSE (M-mode): 2.2 cm RVSP:           21.0 mmHg  LEFT ATRIUM             Index        RIGHT ATRIUM           Index LA diam:        3.40 cm 1.76 cm/m   RA Pressure: 3.00 mmHg LA Vol (A2C):   34.6 ml 17.88 ml/m  RA Area:     18.10 cm LA Vol (A4C):   16.8 ml 8.68 ml/m   RA Volume:   43.60 ml  22.53 ml/m LA Biplane Vol: 24.8 ml 12.82  ml/m AORTIC VALVE LVOT Vmax:   88.80 cm/s LVOT Vmean:  61.300 cm/s LVOT VTI:    0.168 m  AORTA Ao Root diam: 3.60 cm Ao Asc diam:  4.00 cm  MITRAL VALVE               TRICUSPID VALVE TR Peak grad:   18.0 mmHg MV Decel Time: 225 msec    TR Vmax:        212.00 cm/s MV E velocity: 48.80 cm/s  Estimated RAP:  3.00 mmHg MV A velocity: 92.00 cm/s  RVSP:           21.0 mmHg MV E/A ratio:  0.53 SHUNTS Systemic VTI:  0.17 m Systemic Diam: 2.30 cm  Jodelle Red MD Electronically signed by Jodelle Red MD Signature Date/Time: 05/06/2021/1:23:50 PM    Final             Risk Assessment/Calculations:             Physical Exam:   VS:  BP 122/70   Ht 5\' 9"  (1.753 m)   Wt 175 lb 8 oz (79.6 kg)   SpO2 97%   BMI 25.92 kg/m    Wt Readings from Last 3 Encounters:  02/03/23 175 lb 8 oz (79.6 kg)  01/23/23 177 lb (80.3 kg)  12/28/22 177 lb (80.3 kg)    GEN: Well nourished, well developed in no acute distress NECK: No JVD; No carotid bruits CARDIAC: RRR, no murmurs, rubs, gallops RESPIRATORY:  Clear to auscultation without rales, wheezing or rhonchi  ABDOMEN: Soft, non-tender, non-distended EXTREMITIES:  No edema; No deformity   ASSESSMENT AND PLAN: .    CAD / HLD, LDL goal <70 - Reports brief episodes of chest pain associated with dyspnea while walking his dog. These episodes are intermittent and do not require PRN nitroglycerin. Has not been taking Amlodipine due to hypotension and dizziness. EKG today no acute ST/T wave changes. Will resume Amlodipine (lowering Losartan to 25mg  daily  for BP room) for antianginal benefit. We discussed ischemic evaluation with stress testing. After shared decision making, prefers to continue with medication changes and monitor until his upcoming visit with Dr. Tenny Craw in 2 weeks.   Pain involving lower back, right shoulder, right neck - Improved with tylenol. Discussed likely etiology musculoskeletal and recommend further follow up  with PCP.   HTN - BP controlled in clinic. Has not been taking Amlodipine at home due to dizziness/hypotension. Reduce Losartan to 25mg  daily to allow BP room to resume Amlodipine 2.5mg  daily for antianginal benefit.        Dispo: follow up as scheduled with Dr. Tenny Craw  Signed, Alver Sorrow, NP

## 2023-02-07 ENCOUNTER — Other Ambulatory Visit (HOSPITAL_BASED_OUTPATIENT_CLINIC_OR_DEPARTMENT_OTHER): Payer: Self-pay

## 2023-02-07 ENCOUNTER — Encounter (HOSPITAL_BASED_OUTPATIENT_CLINIC_OR_DEPARTMENT_OTHER): Payer: Self-pay | Admitting: Student

## 2023-02-07 ENCOUNTER — Ambulatory Visit (HOSPITAL_BASED_OUTPATIENT_CLINIC_OR_DEPARTMENT_OTHER): Payer: Medicare HMO

## 2023-02-07 ENCOUNTER — Ambulatory Visit (INDEPENDENT_AMBULATORY_CARE_PROVIDER_SITE_OTHER): Payer: Medicare HMO | Admitting: Student

## 2023-02-07 DIAGNOSIS — M79642 Pain in left hand: Secondary | ICD-10-CM

## 2023-02-07 DIAGNOSIS — M25532 Pain in left wrist: Secondary | ICD-10-CM | POA: Diagnosis not present

## 2023-02-07 MED ORDER — METHYLPREDNISOLONE 4 MG PO TBPK
ORAL_TABLET | ORAL | 0 refills | Status: DC
  Filled 2023-02-07: qty 21, 6d supply, fill #0

## 2023-02-07 NOTE — Progress Notes (Signed)
 Chief Complaint: Left wrist pain     History of Present Illness:    John Salazar is a 77 y.o. male here today for evaluation of pain in his left wrist.  This began 3 to 4 days ago without any known injury.  States that pain is located on the thumb side of his wrist and rates severity at a 7/10.  This is a dull pain and not associated with any numbness or tingling.  He does have pain and difficulty when gripping objects.  Has tried Tylenol , Advil, and gabapentin with mild relief.   Surgical History:   None  PMH/PSH/Family History/Social History/Meds/Allergies:    Past Medical History:  Diagnosis Date   Anxiety    Ascending aorta dilation (HCC) 08/22/2021   Echocardiogram 04/2021: 40 mm   Barrett esophagus    BPH (benign prostatic hyperplasia)    Cerebral aneurysm    Chronic kidney disease    stage 2   Colon polyps    Coronary artery disease    mild-mod non-obstructive CAD   Erectile dysfunction    GERD (gastroesophageal reflux disease)    History of kidney stones    History of stomach ulcers    Hypercholesteremia    Hypertension    Positive TB test    in the past   Past Surgical History:  Procedure Laterality Date   APPENDECTOMY  1970   CATARACT EXTRACTION W/ INTRAOCULAR LENS  IMPLANT, BILATERAL     COLONOSCOPY  10/2012   ESOPHAGOGASTRODUODENOSCOPY ENDOSCOPY  10/2012   HERNIA REPAIR     IR 3D INDEPENDENT WKST  04/14/2020   IR ANGIO INTRA EXTRACRAN SEL COM CAROTID INNOMINATE BILAT MOD SED  08/09/2017   IR ANGIO INTRA EXTRACRAN SEL COM CAROTID INNOMINATE BILAT MOD SED  11/08/2018   IR ANGIO INTRA EXTRACRAN SEL COM CAROTID INNOMINATE BILAT MOD SED  04/14/2020   IR ANGIO INTRA EXTRACRAN SEL COM CAROTID INNOMINATE UNI L MOD SED  06/29/2020   IR ANGIO INTRA EXTRACRAN SEL INTERNAL CAROTID UNI L MOD SED  05/20/2020   IR ANGIO VERTEBRAL SEL VERTEBRAL BILAT MOD SED  08/09/2017   IR ANGIO VERTEBRAL SEL VERTEBRAL BILAT MOD SED  11/08/2018   IR ANGIO  VERTEBRAL SEL VERTEBRAL BILAT MOD SED  04/14/2020   IR ANGIOGRAM FOLLOW UP STUDY  05/20/2020   IR INTRAVSC STENT CERV CAROTID W/EMB-PROT MOD SED INCL ANGIO  08/23/2017   IR INTRAVSC STENT CERV CAROTID W/EMB-PROT MOD SED INCL ANGIO  06/29/2020   IR RADIOLOGIST EVAL & MGMT  05/05/2020   IR RADIOLOGIST EVAL & MGMT  06/03/2020   IR RADIOLOGIST EVAL & MGMT  07/15/2020   IR TRANSCATH/EMBOLIZ  05/18/2020   IR US  GUIDE VASC ACCESS RIGHT  04/14/2020   IR US  GUIDE VASC ACCESS RIGHT  05/20/2020   IR US  GUIDE VASC ACCESS RIGHT  06/29/2020   LEFT HEART CATH AND CORONARY ANGIOGRAPHY N/A 11/15/2018   Procedure: LEFT HEART CATH AND CORONARY ANGIOGRAPHY;  Surgeon: Mady Bruckner, MD;  Location: [facility];  Service: Cardiovascular;  Laterality: N/A;   PROSTATE SURGERY  1999   20 years ago, enlarged prostate   RADIOLOGY WITH ANESTHESIA N/A 08/23/2017   Procedure: IR WITH ANESTHESIA STENT PLACEMENT;  Surgeon: Dolphus Carrion, MD;  Location: [facility] OR;  Service: Radiology;  Laterality: N/A;  RADIOLOGY WITH ANESTHESIA N/A 05/18/2020   Procedure: IR WITH ANESTHESIA EMBOLIZATION;  Surgeon: Dolphus Carrion, MD;  Location: [facility] OR;  Service: Radiology;  Laterality: N/A;   RADIOLOGY WITH ANESTHESIA Left 06/29/2020   Procedure: RADIOLOGY WITH ANESTHESIA  LEFT CAROTID STENT PLACEMENT;  Surgeon: Dolphus Carrion, MD;  Location: [facility] OR;  Service: Radiology;  Laterality: Left;   Social History   Socioeconomic History   Marital status: Married    Spouse name: pamela   Number of children: 2   Years of education: 12   Highest education level: Not on file  Occupational History   Occupation: comptroller.  Tobacco Use   Smoking status: Former    Current packs/day: 0.00    Average packs/day: 2.0 packs/day for 25.0 years (50.0 ttl pk-yrs)    Types: Cigarettes    Start date: 03/11/1971    Quit date: 03/10/1996    Years since quitting: 26.9   Smokeless tobacco: Never  Vaping Use   Vaping status: Never Used   Substance and Sexual Activity   Alcohol  use: No    Alcohol /week: 0.0 standard drinks of alcohol     Comment: in the past drank, quit 1993   Drug use: No   Sexual activity: Not Currently  Other Topics Concern   Not on file  Social History Narrative   Lives with Sharlet (Wife) - together for 28 years   Children - Randine and Mliss -- one is a engineer, civil (consulting) in [facility] and other works for [employer] - 18, lives in [place]   Enjoys - playing golf, goes to [employer] and sponsors people as well, church   Support - family, good friends in [place]   Exercise - walks the dog, runs up hills   Diet - improved from before, tries to drink water - low salt or no salt      Retail buyer for [employer]    Social Drivers of Health   Financial Resource Strain: Low Risk  (05/19/2022)   Overall Financial Resource Strain (CARDIA)    Difficulty of Paying Living Expenses: Not hard at all  Food Insecurity: No Food Insecurity (05/19/2022)   Hunger Vital Sign    Worried About Running Out of Food in the Last Year: Never true    Ran Out of Food in the Last Year: Never true  Transportation Needs: No Transportation Needs (05/19/2022)   PRAPARE - Administrator, Civil Service (Medical): No    Lack of Transportation (Non-Medical): No  Physical Activity: Sufficiently Active (05/19/2022)   Exercise Vital Sign    Days of Exercise per Week: 5 days    Minutes of Exercise per Session: 30 min  Stress: No Stress Concern Present (05/19/2022)   [employer] of Occupational Health - Occupational Stress Questionnaire    Feeling of Stress : Not at all  Social Connections: Socially Integrated (05/19/2022)   Social Connection and Isolation Panel [NHANES]    Frequency of Communication with Friends and Family: More than three times a week    Frequency of Social Gatherings with Friends and Family: More than three times a week    Attends Religious Services: More than 4 times per year    Active Member of  [employer] or Organizations: Yes    Attends Engineer, Structural: More than 4 times per year    Marital Status: Married   Family History  Problem Relation Age of Onset   Stroke Mother 30   Aneurysm Father  65   Allergies  Allergen Reactions   Bee Venom Anaphylaxis and Shortness Of Breath   Penicillins Rash    PATIENT HAS HAD A PCN REACTION WITH IMMEDIATE RASH, FACIAL/TONGUE/THROAT SWELLING, SOB, OR LIGHTHEADEDNESS WITH HYPOTENSION:  #  #  YES  #  #  Has patient had a PCN reaction causing severe rash involving mucus membranes or skin necrosis: no Has patient had a PCN reaction that required hospitalization: no Has patient had a PCN reaction occurring within the last 10 years: no    Current Outpatient Medications  Medication Sig Dispense Refill   methylPREDNISolone  (MEDROL  DOSEPAK) 4 MG TBPK tablet Take 6 tablets by mouth on day 1, 5 tabs on day 2, 4 tabs on day 3, 3 tabs on day 4, 2 tabs on day 5, 1 tab on day 6. Then stop. 21 tablet 0   amLODipine  (NORVASC ) 2.5 MG tablet Take 1 tablet (2.5 mg total) by mouth daily. (Patient taking differently: Take 2.5 mg by mouth 2 (two) times daily.) 180 tablet 3   aspirin  EC 81 MG tablet Take 1 tablet (81 mg total) by mouth daily. Swallow whole. 90 tablet 3   clopidogrel  (PLAVIX ) 75 MG tablet TAKE ONE TABLET BY MOUTH DAILY 90 tablet 3   escitalopram  (LEXAPRO ) 20 MG tablet Take 1 tablet (20 mg total) by mouth daily. 90 tablet 3   famotidine  (PEPCID ) 20 MG tablet Take 20 mg by mouth daily as needed for heartburn or indigestion.     losartan  (COZAAR ) 50 MG tablet Take 0.5 tablets (25 mg total) by mouth daily.     nitroGLYCERIN  (NITROSTAT ) 0.4 MG SL tablet Place 1 tablet (0.4 mg total) under the tongue every 5 (five) minutes as needed for chest pain. 25 tablet prn   pantoprazole  (PROTONIX ) 40 MG tablet Take 1 tablet (40 mg total) by mouth 2 (two) times daily. 90 tablet 3   rosuvastatin  (CRESTOR ) 40 MG tablet Take one (1) tablet by mouth (40 mg) daily.  90 tablet 3   tamsulosin  (FLOMAX ) 0.4 MG CAPS capsule Take one capsule once daily ( thirty minutes after same meal daily)     No current facility-administered medications for this visit.   No results found.  Review of Systems:   A ROS was performed including pertinent positives and negatives as documented in the HPI.  Physical Exam :   Constitutional: NAD and appears stated age Neurological: Alert and oriented Psych: Appropriate affect and cooperative There were no vitals taken for this visit.   Comprehensive Musculoskeletal Exam:    Left wrist exam demonstrates tenderness over the first and second dorsal compartments without any overlying edema or erythema.  Tenderness throughout the thenar eminence.  Grossly negative Finklestein's test.  Grip strength notably decreased compared to contralateral side.  Negative Tinel's test.  Radial pulse 2+.  Imaging:   Xray (left hand 3 views): Mild first CMC osteoarthritis however otherwise negative for bony abnormality   I personally reviewed and interpreted the radiographs.   Assessment:   77 y.o. male with 4-day history of radial sided left wrist pain.  Based on his presentation I do have high suspicion for wrist tendinitis, particularly de Quervain's although pain seems to be more exacerbated with radial deviation versus ulnar.  There is some evidence of early first CMC arthritis which could also be a contributor.  Low suspicion for gout or bony injury.  I will start him on a Medrol  Dosepak and have him continue to monitor symptoms.  Recommended use of  over-the-counter bracing as needed.  Plan :    -Start Medrol  Dosepak today -Return to clinic for follow-up should symptoms not improve within 3 weeks     I personally saw and evaluated the patient, and participated in the management and treatment plan.  Leonce Reveal, PA-C Orthopedics

## 2023-02-12 NOTE — Progress Notes (Signed)
 Cardiology Office Note   Date:  02/14/2023   ID:  Crispin, Vogel [birth year], MRN [id number]  PCP:  Corwin Antu, FNP  Cardiologist:   Vina Gull, MD   Pt presents for f/u of HTN and CAD     History of Present Illness: John Salazar is a 78 y.o. male with a history of CAD, HTN and CV dz (s/p PTA/ stent 2019, 2022), GERD, EtOH use    In 2020, LHC showed mod CAD and one tight OM lesion.  Plan was to continue medical Rx      I saw the pt in clinic in Oct 2024  The pt called in in Dec  Complained of some CP / SOB while walking dogs    Seen in clinic by John Salazar on 02/03/23.  CP not felt to be  cardiac  but amlodipine  added back to regimen and losartan  dose decreased (to avoid dizziness)    SInce seen the pt says his breathing is good   He denies CP No dizziness  He gas been seen in GI   Was on ABX for gut  Doing better   To start a low FODMAP diet        Current Meds  Medication Sig   amLODipine  (NORVASC ) 2.5 MG tablet Take 1 tablet (2.5 mg total) by mouth daily.   aspirin  EC 81 MG tablet Take 1 tablet (81 mg total) by mouth daily. Swallow whole.   clopidogrel  (PLAVIX ) 75 MG tablet TAKE ONE TABLET BY MOUTH DAILY   escitalopram  (LEXAPRO ) 20 MG tablet Take 1 tablet (20 mg total) by mouth daily.   famotidine  (PEPCID ) 20 MG tablet Take 20 mg by mouth daily as needed for heartburn or indigestion.   losartan  (COZAAR ) 50 MG tablet Take 0.5 tablets (25 mg total) by mouth daily.   methylPREDNISolone  (MEDROL  DOSEPAK) 4 MG TBPK tablet Take 6 tablets by mouth on day 1, 5 tabs on day 2, 4 tabs on day 3, 3 tabs on day 4, 2 tabs on day 5, 1 tab on day 6. Then stop.   pantoprazole  (PROTONIX ) 40 MG tablet Take 1 tablet (40 mg total) by mouth 2 (two) times daily.   rosuvastatin  (CRESTOR ) 40 MG tablet Take one (1) tablet by mouth (40 mg) daily.   tamsulosin  (FLOMAX ) 0.4 MG CAPS capsule Take one capsule once daily ( thirty minutes after same meal daily)   [DISCONTINUED] nitroGLYCERIN  (NITROSTAT )  0.4 MG SL tablet Place 1 tablet (0.4 mg total) under the tongue every 5 (five) minutes as needed for chest pain.     Allergies:   Bee venom and Penicillins   Past Medical History:  Diagnosis Date   Anxiety    Ascending aorta dilation (HCC) 08/22/2021   Echocardiogram 04/2021: 40 mm   Barrett esophagus    BPH (benign prostatic hyperplasia)    Cerebral aneurysm    Chronic kidney disease    stage 2   Colon polyps    Coronary artery disease    mild-mod non-obstructive CAD   Erectile dysfunction    GERD (gastroesophageal reflux disease)    History of kidney stones    History of stomach ulcers    Hypercholesteremia    Hypertension    Positive TB test    in the past    Past Surgical History:  Procedure Laterality Date   APPENDECTOMY  1970   CATARACT EXTRACTION W/ INTRAOCULAR LENS  IMPLANT, BILATERAL     COLONOSCOPY  10/2012  ESOPHAGOGASTRODUODENOSCOPY ENDOSCOPY  10/2012   HERNIA REPAIR     IR 3D INDEPENDENT WKST  04/14/2020   IR ANGIO INTRA EXTRACRAN SEL COM CAROTID INNOMINATE BILAT MOD SED  08/09/2017   IR ANGIO INTRA EXTRACRAN SEL COM CAROTID INNOMINATE BILAT MOD SED  11/08/2018   IR ANGIO INTRA EXTRACRAN SEL COM CAROTID INNOMINATE BILAT MOD SED  04/14/2020   IR ANGIO INTRA EXTRACRAN SEL COM CAROTID INNOMINATE UNI L MOD SED  06/29/2020   IR ANGIO INTRA EXTRACRAN SEL INTERNAL CAROTID UNI L MOD SED  05/20/2020   IR ANGIO VERTEBRAL SEL VERTEBRAL BILAT MOD SED  08/09/2017   IR ANGIO VERTEBRAL SEL VERTEBRAL BILAT MOD SED  11/08/2018   IR ANGIO VERTEBRAL SEL VERTEBRAL BILAT MOD SED  04/14/2020   IR ANGIOGRAM FOLLOW UP STUDY  05/20/2020   IR INTRAVSC STENT CERV CAROTID W/EMB-PROT MOD SED INCL ANGIO  08/23/2017   IR INTRAVSC STENT CERV CAROTID W/EMB-PROT MOD SED INCL ANGIO  06/29/2020   IR RADIOLOGIST EVAL & MGMT  05/05/2020   IR RADIOLOGIST EVAL & MGMT  06/03/2020   IR RADIOLOGIST EVAL & MGMT  07/15/2020   IR TRANSCATH/EMBOLIZ  05/18/2020   IR US  GUIDE VASC ACCESS RIGHT  04/14/2020   IR US  GUIDE VASC  ACCESS RIGHT  05/20/2020   IR US  GUIDE VASC ACCESS RIGHT  06/29/2020   LEFT HEART CATH AND CORONARY ANGIOGRAPHY N/A 11/15/2018   Procedure: LEFT HEART CATH AND CORONARY ANGIOGRAPHY;  Surgeon: Mady Bruckner, MD;  Location: [facility];  Service: Cardiovascular;  Laterality: N/A;   PROSTATE SURGERY  1999   20 years ago, enlarged prostate   RADIOLOGY WITH ANESTHESIA N/A 08/23/2017   Procedure: IR WITH ANESTHESIA STENT PLACEMENT;  Surgeon: Dolphus Carrion, MD;  Location: [facility] OR;  Service: Radiology;  Laterality: N/A;   RADIOLOGY WITH ANESTHESIA N/A 05/18/2020   Procedure: IR WITH ANESTHESIA EMBOLIZATION;  Surgeon: Dolphus Carrion, MD;  Location: [facility] OR;  Service: Radiology;  Laterality: N/A;   RADIOLOGY WITH ANESTHESIA Left 06/29/2020   Procedure: RADIOLOGY WITH ANESTHESIA  LEFT CAROTID STENT PLACEMENT;  Surgeon: Dolphus Carrion, MD;  Location: [facility] OR;  Service: Radiology;  Laterality: Left;     Social History:  The patient  reports that he quit smoking about 26 years ago. His smoking use included cigarettes. He started smoking about 51 years ago. He has a 50 pack-year smoking history. He has never used smokeless tobacco. He reports that he does not drink alcohol  and does not use drugs.   Family History:  The patient's family history includes Aneurysm (age of onset: 10) in his father; Stroke (age of onset: 72) in his mother.    ROS:  Please see the history of present illness. All other systems are reviewed and  Negative to the above problem except as noted.    PHYSICAL EXAM: VS:  BP 126/80 (BP Location: Right Arm, Patient Position: Sitting, Cuff Size: Large)   Pulse 77   Resp 16   Ht 5' 9 (1.753 m)   Wt 176 lb 3.2 oz (79.9 kg)   SpO2 96%   BMI 26.02 kg/m   GEN:  78 yo male in no acute distress  Neck: JVP is normal   No bruits Cardiac: RRR; No murmur No LE edema  Respiratory:  clear to auscultation  GI: soft, nontender  No hepatomegaly   EKG:  EKG not done  Lipid Panel     Component Value Date/Time   CHOL 116 11/16/2021 0821   CHOL  124 04/19/2021 0720   TRIG 101.0 11/16/2021 0821   HDL 48.10 11/16/2021 0821   HDL 48 04/19/2021 0720   CHOLHDL 2 11/16/2021 0821   VLDL 20.2 11/16/2021 0821   LDLCALC 48 11/16/2021 0821   LDLCALC 55 04/19/2021 0720    CATH:  11/15/18  Left Main  Vessel is large.  Dist LM lesion is 20% stenosed.  Left Anterior Descending  Vessel is moderate in size.  Ost LAD to Prox LAD lesion is 35% stenosed. The lesion is moderately calcified.  Prox LAD to Mid LAD lesion is 45% stenosed. The lesion is eccentric.  First Diagonal Branch  Vessel is small in size.  Left Circumflex  Vessel is moderate in size.  Ost Cx to Prox Cx lesion is 40% stenosed.  Mid Cx to Dist Cx lesion is 70% stenosed.  First Obtuse Marginal Branch  Vessel is moderate in size.  Lateral First Obtuse Marginal Branch  Vessel is small in size.  Lat 1st Mrg lesion is 80% stenosed.  Second Obtuse Marginal Branch  Vessel is small in size.  2nd Mrg lesion is 70% stenosed.  Third Obtuse Marginal Branch  Vessel is small in size.  Right Coronary Artery  Vessel is small. Anterior takeoff.  Prox RCA lesion is 40% stenosed.     Wt Readings from Last 3 Encounters:  02/14/23 176 lb 3.2 oz (79.9 kg)  02/03/23 175 lb 8 oz (79.6 kg)  01/23/23 177 lb (80.3 kg)      ASSESSMENT AND PLAN:  1 CAD Cath in 2020  Distal and small vessel dz in LCx, OM  Recent call in / visit   Meds adjusted   Pt tolerating without CP or dizziness  Follow    Wll stop ASA and keep on plavix     2  HTN  BP is controlled now   Tolerating low dose amlodipine  with lower dose losartan    Follow   3  CV dz Hx of bilateral carotid artery stents   GIven this will kep on ASA and Plavix    4  L ICA aneurysm   s/p embolization   4  Lipids   LDL 53 in October      5  Metabolics    A1C 5.9 in October 2024   Limit carbs   Stay active      Follow up next fall     Signed, Vina Gull, MD   02/14/2023 10:27 AM    [facility] [address] [place], [place], [place]  [address] Phone: [phone]; Fax: [phone]

## 2023-02-14 ENCOUNTER — Encounter: Payer: Self-pay | Admitting: Internal Medicine

## 2023-02-14 ENCOUNTER — Ambulatory Visit: Payer: Medicare HMO | Attending: Internal Medicine | Admitting: Internal Medicine

## 2023-02-14 VITALS — BP 126/80 | HR 77 | Resp 16 | Ht 69.0 in | Wt 176.2 lb

## 2023-02-14 DIAGNOSIS — I2510 Atherosclerotic heart disease of native coronary artery without angina pectoris: Secondary | ICD-10-CM

## 2023-02-14 MED ORDER — NITROGLYCERIN 0.4 MG SL SUBL: 0.4000 mg | SUBLINGUAL_TABLET | SUBLINGUAL | 99 refills | Status: DC | PRN

## 2023-02-14 MED ORDER — ISOSORBIDE MONONITRATE ER 30 MG PO TB24: 15.0000 mg | ORAL_TABLET | Freq: Every day | ORAL | 6 refills | Status: DC

## 2023-02-14 NOTE — Patient Instructions (Signed)
 Medication Instructions:   *If you need a refill on your cardiac medications before your next appointment, please call your pharmacy*   Lab Work:  If you have labs (blood work) drawn today and your tests are completely normal, you will receive your results only by: MyChart Message (if you have MyChart) OR A paper copy in the mail If you have any lab test that is abnormal or we need to change your treatment, we will call you to review the results.   Testing/Procedures:    Follow-Up: At [facility], you and your health needs are our priority.  As part of our continuing mission to provide you with exceptional heart care, we have created designated Provider Care Teams.  These Care Teams include your primary Cardiologist (physician) and Advanced Practice Providers (APPs -  Physician Assistants and Nurse Practitioners) who all work together to provide you with the care you need, when you need it.  We recommend signing up for the patient portal called MyChart.  Sign up information is provided on this After Visit Summary.  MyChart is used to connect with patients for Virtual Visits (Telemedicine).  Patients are able to view lab/test results, encounter notes, upcoming appointments, etc.  Non-urgent messages can be sent to your provider as well.   To learn more about what you can do with MyChart, go to [email or web address].    Your next appointment: END OF AUGUST 2025

## 2023-02-16 ENCOUNTER — Ambulatory Visit: Payer: Medicare HMO | Admitting: Internal Medicine

## 2023-03-14 ENCOUNTER — Encounter: Payer: Self-pay | Admitting: Family

## 2023-03-15 ENCOUNTER — Ambulatory Visit (INDEPENDENT_AMBULATORY_CARE_PROVIDER_SITE_OTHER): Payer: Medicare HMO | Admitting: Family

## 2023-03-15 ENCOUNTER — Telehealth: Payer: Self-pay | Admitting: Family

## 2023-03-15 ENCOUNTER — Encounter: Payer: Self-pay | Admitting: Family

## 2023-03-15 VITALS — BP 124/82 | HR 74 | Temp 98.3°F | Ht 69.0 in | Wt 179.8 lb

## 2023-03-15 DIAGNOSIS — N1832 Chronic kidney disease, stage 3b: Secondary | ICD-10-CM | POA: Diagnosis not present

## 2023-03-15 DIAGNOSIS — I10 Essential (primary) hypertension: Secondary | ICD-10-CM | POA: Diagnosis not present

## 2023-03-15 DIAGNOSIS — K2100 Gastro-esophageal reflux disease with esophagitis, without bleeding: Secondary | ICD-10-CM | POA: Insufficient documentation

## 2023-03-15 DIAGNOSIS — R413 Other amnesia: Secondary | ICD-10-CM | POA: Diagnosis not present

## 2023-03-15 DIAGNOSIS — K319 Disease of stomach and duodenum, unspecified: Secondary | ICD-10-CM | POA: Insufficient documentation

## 2023-03-15 DIAGNOSIS — E538 Deficiency of other specified B group vitamins: Secondary | ICD-10-CM | POA: Diagnosis not present

## 2023-03-15 DIAGNOSIS — M65322 Trigger finger, left index finger: Secondary | ICD-10-CM

## 2023-03-15 LAB — TSH: TSH: 1.6 u[IU]/mL (ref 0.35–5.50)

## 2023-03-15 LAB — BASIC METABOLIC PANEL
BUN: 18 mg/dL (ref 6–23)
CO2: 28 meq/L (ref 19–32)
Calcium: 9.3 mg/dL (ref 8.4–10.5)
Chloride: 104 meq/L (ref 96–112)
Creatinine, Ser: 1.57 mg/dL — ABNORMAL HIGH (ref 0.40–1.50)
GFR: 41.96 mL/min — ABNORMAL LOW (ref >60.00)
Glucose, Bld: 92 mg/dL (ref 70–99)
Potassium: 4.2 meq/L (ref 3.5–5.1)
Sodium: 139 meq/L (ref 135–145)

## 2023-03-15 LAB — VITAMIN B12: Vitamin B-12: 376 pg/mL (ref 211–911)

## 2023-03-15 LAB — MICROALBUMIN / CREATININE URINE RATIO
Creatinine,U: 103.6 mg/dL
Microalb Creat Ratio: 3.3 mg/g (ref 0.0–30.0)
Microalb, Ur: 3.4 mg/dL — ABNORMAL HIGH (ref 0.0–1.9)

## 2023-03-15 NOTE — Assessment & Plan Note (Signed)
 Repeat bmp today  Has been stable

## 2023-03-15 NOTE — Assessment & Plan Note (Addendum)
 MOCA exam completed in office today Tsh and b12 ordered pending results if no issue, then will consider referral to neurology  Discussed puzzles, things to stimulate the mind on a daily basis.

## 2023-03-15 NOTE — Assessment & Plan Note (Signed)
 At this time pt to continue with GI recommendation and daily use of PPI for gastropathy  Try to decrease and or avoid spicy foods, fried fatty foods, and also caffeine and chocolate as these can increase heartburn symptoms.   There is a potential for possible contribution to alzheimer dementia so will always keep this into consideration, benefits vs risk. However for now, with gastropathy this is benefits over risks to heal.

## 2023-03-15 NOTE — Progress Notes (Signed)
 Established Patient Office Visit  Subjective:   Patient ID: John Salazar, male    DOB: 08-20-1945  Age: 78 y.o. MRN: [id number]  CC:  Chief Complaint  Patient presents with   Memory Issues    Reports that he first noticed issues with his memory over the past few months.    HPI: John Salazar is a 78 y.o. male presenting on 03/15/2023 for Memory Issues (Reports that he first noticed issues with his memory over the past few months.)  Recent EGD 01/23/23, biopsies taken from stomach were notable for mild reactive gastrophy.  Negative for h pylori. Goal from note with GI was to try twice daily x one month then wean down to once daily and go from there. Pt still on twice daily.   No known family history of dementia/alzheimers.   Some short term memory loss, after talking with someone he doesn't have quick recollection of memory related things, worse as of recently in the last few months. At times find he is having troubles remembering names. He just decreased to once daily as of today. Doesn't have issues driving or remembering routes. No wandering and not knowing how he got there.   Vitamin b12 def: taking b12 1000 mcg once daily a few times a week, doesn't take daily.   He no longer drinks alcohol , sober. He goes to AA.   Also with c/o trigger finger left finger. Pops in the am when he wakes up otherwise gets stuck in a bent position. He wants to wait for now with referral to hand surgeon will let me know if he wants referral.       ROS: Negative unless specifically indicated above in HPI.   Relevant past medical history reviewed and updated as indicated.   Allergies and medications reviewed and updated.   Current Outpatient Medications:    amLODipine  (NORVASC ) 2.5 MG tablet, Take 1 tablet (2.5 mg total) by mouth daily., Disp: 180 tablet, Rfl: 3   aspirin  EC 81 MG tablet, Take 1 tablet (81 mg total) by mouth daily. Swallow whole., Disp: 90 tablet, Rfl: 3   clopidogrel   (PLAVIX ) 75 MG tablet, TAKE ONE TABLET BY MOUTH DAILY, Disp: 90 tablet, Rfl: 3   escitalopram  (LEXAPRO ) 20 MG tablet, Take 1 tablet (20 mg total) by mouth daily., Disp: 90 tablet, Rfl: 3   famotidine  (PEPCID ) 20 MG tablet, Take 20 mg by mouth daily as needed for heartburn or indigestion., Disp: , Rfl:    isosorbide  mononitrate (IMDUR ) 30 MG 24 hr tablet, Take 0.5 tablets (15 mg total) by mouth daily. AS NEEDED, Disp: 30 tablet, Rfl: 6   losartan  (COZAAR ) 50 MG tablet, Take 0.5 tablets (25 mg total) by mouth daily., Disp: , Rfl:    nitroGLYCERIN  (NITROSTAT ) 0.4 MG SL tablet, Place 1 tablet (0.4 mg total) under the tongue every 5 (five) minutes as needed for chest pain., Disp: 25 tablet, Rfl: prn   pantoprazole  (PROTONIX ) 40 MG tablet, Take 1 tablet (40 mg total) by mouth 2 (two) times daily., Disp: 90 tablet, Rfl: 3   rosuvastatin  (CRESTOR ) 40 MG tablet, Take one (1) tablet by mouth (40 mg) daily., Disp: 90 tablet, Rfl: 3   tamsulosin  (FLOMAX ) 0.4 MG CAPS capsule, Take one capsule once daily ( thirty minutes after same meal daily), Disp: , Rfl:   Allergies  Allergen Reactions   Bee Venom Anaphylaxis and Shortness Of Breath   Penicillins Rash    PATIENT HAS HAD A PCN REACTION WITH  IMMEDIATE RASH, FACIAL/TONGUE/THROAT SWELLING, SOB, OR LIGHTHEADEDNESS WITH HYPOTENSION:  #  #  YES  #  #  Has patient had a PCN reaction causing severe rash involving mucus membranes or skin necrosis: no Has patient had a PCN reaction that required hospitalization: no Has patient had a PCN reaction occurring within the last 10 years: no     Objective:   BP 124/82 (BP Location: Left Arm, Patient Position: Sitting, Cuff Size: Normal)   Pulse 74   Temp 98.3 F (36.8 C) (Temporal)   Ht 5' 9 (1.753 m)   Wt 179 lb 12.8 oz (81.6 kg)   SpO2 98%   BMI 26.55 kg/m    Physical Exam Vitals reviewed.  Constitutional:      General: He is not in acute distress.    Appearance: Normal appearance. He is normal weight. He  is not ill-appearing, toxic-appearing or diaphoretic.  Cardiovascular:     Rate and Rhythm: Normal rate.  Pulmonary:     Effort: Pulmonary effort is normal.  Musculoskeletal:        General: Normal range of motion.  Neurological:     General: No focal deficit present.     Mental Status: He is alert and oriented to person, place, and time. Mental status is at baseline.     Cranial Nerves: Cranial nerves 2-12 are intact. No cranial nerve deficit or facial asymmetry.     Sensory: Sensation is intact.  Psychiatric:        Mood and Affect: Mood normal.        Behavior: Behavior normal.        Thought Content: Thought content normal.        Judgment: Judgment normal.     Assessment & Plan:  Short-term memory loss Assessment & Plan: MOCA exam completed in office today Tsh and b12 ordered pending results if no issue, then will consider referral to neurology  Discussed puzzles, things to stimulate the mind on a daily basis.   Orders: -     Vitamin B12 -     TSH  Stage 3b chronic kidney disease (HCC) Assessment & Plan: Repeat bmp today  Has been stable   Orders: -     Basic metabolic panel  Gastropathy Assessment & Plan: At this time pt to continue with GI recommendation and daily use of PPI for gastropathy  Try to decrease and or avoid spicy foods, fried fatty foods, and also caffeine and chocolate as these can increase heartburn symptoms.   There is a potential for possible contribution to alzheimer dementia so will always keep this into consideration, benefits vs risk. However for now, with gastropathy this is benefits over risks to heal.    Vitamin B12 deficiency -     Vitamin B12 -     Basic metabolic panel  Benign essential HTN -     Microalbumin / creatinine urine ratio  Trigger index finger of left hand Assessment & Plan: At this time pt declines hand surgeon referral Will let me know if referral needed      Follow up plan: Return if symptoms worsen or fail  to improve.  Ginger Patrick, FNP

## 2023-03-15 NOTE — Telephone Encounter (Signed)
 Good afternoon, I saw our mutual pt in office today, and he was a bit confused about the anti coag. He thinks you advised him to stop asa and continue plavix  but wasn't sure. I see per your last note it says stop asa continue plavix . can you provide some clarification? thanks!

## 2023-03-15 NOTE — Assessment & Plan Note (Signed)
 At this time pt declines hand surgeon referral Will let me know if referral needed

## 2023-03-16 ENCOUNTER — Encounter: Payer: Self-pay | Admitting: Family

## 2023-03-17 ENCOUNTER — Encounter: Payer: Self-pay | Admitting: Gastroenterology

## 2023-03-17 ENCOUNTER — Encounter: Payer: Self-pay | Admitting: Internal Medicine

## 2023-03-17 NOTE — Telephone Encounter (Signed)
 Left message to return call to our office.

## 2023-03-17 NOTE — Telephone Encounter (Signed)
 Patient called back and information given. Will call if any questions.

## 2023-03-17 NOTE — Telephone Encounter (Signed)
 Please contact patient to notify him that he can stop the aspirin .  He should be taking Plavix  daily.

## 2023-03-23 ENCOUNTER — Other Ambulatory Visit: Payer: Self-pay | Admitting: Family

## 2023-03-23 DIAGNOSIS — F321 Major depressive disorder, single episode, moderate: Secondary | ICD-10-CM

## 2023-04-21 ENCOUNTER — Encounter

## 2023-04-22 ENCOUNTER — Ambulatory Visit: Admission: EM | Admit: 2023-04-22 | Discharge: 2023-04-22 | Disposition: A | Discharge status: Home / Self Care

## 2023-04-22 ENCOUNTER — Ambulatory Visit (INDEPENDENT_AMBULATORY_CARE_PROVIDER_SITE_OTHER)

## 2023-04-22 DIAGNOSIS — J0100 Acute maxillary sinusitis, unspecified: Secondary | ICD-10-CM | POA: Diagnosis not present

## 2023-04-22 DIAGNOSIS — R051 Acute cough: Secondary | ICD-10-CM

## 2023-04-22 DIAGNOSIS — R531 Weakness: Secondary | ICD-10-CM | POA: Diagnosis not present

## 2023-04-22 DIAGNOSIS — I700 Atherosclerosis of aorta: Secondary | ICD-10-CM | POA: Diagnosis not present

## 2023-04-22 DIAGNOSIS — R059 Cough, unspecified: Secondary | ICD-10-CM | POA: Diagnosis not present

## 2023-04-22 MED ORDER — AZITHROMYCIN 250 MG PO TABS: ORAL_TABLET | ORAL | 0 refills | Status: DC

## 2023-04-22 MED ORDER — PREDNISONE 20 MG PO TABS: 40.0000 mg | ORAL_TABLET | Freq: Every day | ORAL | 0 refills | Status: AC

## 2023-04-22 MED ORDER — PROMETHAZINE-DM 6.25-15 MG/5ML PO SYRP: 5.0000 mL | ORAL_SOLUTION | Freq: Three times a day (TID) | ORAL | 0 refills | Status: DC | PRN

## 2023-04-22 NOTE — Discharge Instructions (Addendum)
 Chest x-ray done today and final results showed no pneumonia or other abnormalities.  Given the symptoms and the severity, this seems to be most consistent with a maxillary sinus infection.  There may be a small aspect of bronchitis as well.  Given this we will treat with the following: Azithromycin 250mg  Take 2 tablets today and the 1 tablet daily for 4 more days. Prednisone 40 mg (2 tablets) once daily for 5 days. Take this in the morning.  This is a steroid to help with inflammation and pain.  Promethazine DM 5 mL every 8 hours as needed for cough.  Use caution as this medication can cause drowsiness. Rest and stay hydrated.   Return to urgent care or PCP if symptoms worsen or fail to resolve.

## 2023-04-22 NOTE — ED Triage Notes (Signed)
"  This started about 10 days ago (Wednesday) with body aches then by that Friday started with Cough/Congestion. I then did a COVID19 test and was Negative, now it seems to have made my cough productive, low grade Fever recently and a lot of sinus congestion/pressure". "I did the RSV and Flu vaccine's this season, No COVID19 this season".

## 2023-04-22 NOTE — ED Provider Notes (Signed)
 [facility]    CSN: [id number] Arrival date & time: 04/22/23  1044      History   Chief Complaint Chief Complaint  Patient presents with   Flu like Symptoms    HPI John Salazar is a 78 y.o. male.   78 year old male who presents urgent care with complaints of cough, weakness, fatigue, low-grade fever, congestion and poor appetite.  This started about 10 days ago but has only gotten worse.  He did do a COVID test within the first 3 days of his symptoms and this was negative.  Over the last couple of days his cough has become much more productive and he has gotten much more weak.  He gets dizzy when he is coughing a lot.  He denies any frank shortness of breath.  He has not been eating much but he is staying hydrated.     Past Medical History:  Diagnosis Date   Anxiety    Ascending aorta dilation (HCC) 08/22/2021   Echocardiogram 04/2021: 40 mm   Barrett esophagus    BPH (benign prostatic hyperplasia)    Cerebral aneurysm    Chronic kidney disease    stage 2   Colon polyps    Coronary artery disease    mild-mod non-obstructive CAD   Erectile dysfunction    GERD (gastroesophageal reflux disease)    History of kidney stones    History of stomach ulcers    Hypercholesteremia    Hypertension    Positive TB test    in the past    Patient Active Problem List   Diagnosis Date Noted   Short-term memory loss 03/15/2023   Gastroesophageal reflux disease with esophagitis without hemorrhage 03/15/2023   Gastropathy 03/15/2023   Trigger index finger of left hand 03/15/2023   OSA (obstructive sleep apnea) 11/14/2022   Depression, major, single episode, moderate (HCC) 06/10/2022   Hx of long term use of blood thinners 06/10/2022   Vitamin B12 deficiency 05/23/2022   Coronary artery disease involving native coronary artery of native heart with angina pectoris (HCC) 08/22/2021   Carotid artery disease (HCC) 08/22/2021   Ascending aorta dilation (HCC) 08/22/2021    Incomplete RBBB 08/05/2021   Chronic diarrhea 11/23/2020   Internal carotid artery stenosis, right 06/29/2020   Brain aneurysm 05/18/2020   Snoring 02/27/2019   Accelerating angina (HCC) 11/15/2018   DNR (do not resuscitate) 08/23/2018   Hx of cataract surgery 08/23/2018   History of right common carotid artery stent placement 06/18/2018   Prediabetes 03/19/2018   Pain in right knee 03/05/2018   CKD (chronic kidney disease) stage 3, GFR 30-59 ml/min (HCC) 02/05/2018   Barrett esophagus 01/08/2018   Benign essential HTN 01/08/2018   BPH (benign prostatic hyperplasia) 01/08/2018   Hyperlipidemia LDL goal <70 01/08/2018   Anemia 01/08/2018   Imbalance 06/26/2017    Past Surgical History:  Procedure Laterality Date   APPENDECTOMY  1970   CATARACT EXTRACTION W/ INTRAOCULAR LENS  IMPLANT, BILATERAL     COLONOSCOPY  10/2012   ESOPHAGOGASTRODUODENOSCOPY ENDOSCOPY  10/2012   HERNIA REPAIR     IR 3D INDEPENDENT WKST  04/14/2020   IR ANGIO INTRA EXTRACRAN SEL COM CAROTID INNOMINATE BILAT MOD SED  08/09/2017   IR ANGIO INTRA EXTRACRAN SEL COM CAROTID INNOMINATE BILAT MOD SED  11/08/2018   IR ANGIO INTRA EXTRACRAN SEL COM CAROTID INNOMINATE BILAT MOD SED  04/14/2020   IR ANGIO INTRA EXTRACRAN SEL COM CAROTID INNOMINATE UNI L MOD SED  06/29/2020  IR ANGIO INTRA EXTRACRAN SEL INTERNAL CAROTID UNI L MOD SED  05/20/2020   IR ANGIO VERTEBRAL SEL VERTEBRAL BILAT MOD SED  08/09/2017   IR ANGIO VERTEBRAL SEL VERTEBRAL BILAT MOD SED  11/08/2018   IR ANGIO VERTEBRAL SEL VERTEBRAL BILAT MOD SED  04/14/2020   IR ANGIOGRAM FOLLOW UP STUDY  05/20/2020   IR INTRAVSC STENT CERV CAROTID W/EMB-PROT MOD SED INCL ANGIO  08/23/2017   IR INTRAVSC STENT CERV CAROTID W/EMB-PROT MOD SED INCL ANGIO  06/29/2020   IR RADIOLOGIST EVAL & MGMT  05/05/2020   IR RADIOLOGIST EVAL & MGMT  06/03/2020   IR RADIOLOGIST EVAL & MGMT  07/15/2020   IR TRANSCATH/EMBOLIZ  05/18/2020   IR US GUIDE VASC ACCESS RIGHT  04/14/2020   IR US GUIDE VASC ACCESS  RIGHT  05/20/2020   IR US GUIDE VASC ACCESS RIGHT  06/29/2020   LEFT HEART CATH AND CORONARY ANGIOGRAPHY N/A 11/15/2018   Procedure: LEFT HEART CATH AND CORONARY ANGIOGRAPHY;  Surgeon: Yvonne Kendall, MD;  Location: [facility];  Service: Cardiovascular;  Laterality: N/A;   PROSTATE SURGERY  1999   20 years ago, enlarged prostate   RADIOLOGY WITH ANESTHESIA N/A 08/23/2017   Procedure: IR WITH ANESTHESIA STENT PLACEMENT;  Surgeon: Julieanne Cotton, MD;  Location: [facility] OR;  Service: Radiology;  Laterality: N/A;   RADIOLOGY WITH ANESTHESIA N/A 05/18/2020   Procedure: IR WITH ANESTHESIA EMBOLIZATION;  Surgeon: Julieanne Cotton, MD;  Location: [facility] OR;  Service: Radiology;  Laterality: N/A;   RADIOLOGY WITH ANESTHESIA Left 06/29/2020   Procedure: RADIOLOGY WITH ANESTHESIA  LEFT CAROTID STENT PLACEMENT;  Surgeon: Julieanne Cotton, MD;  Location: [facility] OR;  Service: Radiology;  Laterality: Left;       Home Medications    Prior to Admission medications   Medication Sig Start Date End Date Taking? Authorizing Provider  amLODipine (NORVASC) 2.5 MG tablet Take 1 tablet (2.5 mg total) by mouth daily. Patient taking differently: Take 2.5 mg by mouth 2 (two) times daily. 12/09/22  Yes Pricilla Riffle, MD  amLODipine (NORVASC) 5 MG tablet Take 5 mg by mouth daily. 04/02/23  Yes [provider]  clopidogrel (PLAVIX) 75 MG tablet TAKE ONE TABLET BY MOUTH DAILY 06/27/22  Yes Pricilla Riffle, MD  escitalopram (LEXAPRO) 20 MG tablet Take 1 tablet (20 mg total) by mouth daily. 03/23/23  Yes Mort Sawyers, FNP  famotidine (PEPCID) 20 MG tablet Take 20 mg by mouth daily as needed for heartburn or indigestion.   Yes [provider]  losartan (COZAAR) 50 MG tablet Take 0.5 tablets (25 mg total) by mouth daily. Patient taking differently: Take 50 mg by mouth daily. 02/03/23  Yes Alver Sorrow, NP  rosuvastatin (CRESTOR) 40 MG tablet Take one (1) tablet by mouth (40 mg) daily. 12/22/22  Yes Pricilla Riffle, MD  tamsulosin [facility] 0.4 MG CAPS capsule Take one capsule once daily ( thirty minutes after same meal daily) 11/28/22  Yes Pricilla Riffle, MD  aspirin EC 81 MG tablet Take 1 tablet (81 mg total) by mouth daily. Swallow whole. 08/23/21   Tereso Newcomer T, PA-C  isosorbide mononitrate (IMDUR) 30 MG 24 hr tablet Take 0.5 tablets (15 mg total) by mouth daily. AS NEEDED 02/14/23   Pricilla Riffle, MD  nitroGLYCERIN (NITROSTAT) 0.4 MG SL tablet Place 1 tablet (0.4 mg total) under the tongue every 5 (five) minutes as needed for chest pain. 02/14/23   Pricilla Riffle, MD  pantoprazole (PROTONIX) 40  MG tablet Take 1 tablet (40 mg total) by mouth 2 (two) times daily. 01/23/23   Jenel Lucks, MD    Family History Family History  Problem Relation Age of Onset   Stroke Mother 63   Aneurysm Father 40    Social History Social History   Tobacco Use   Smoking status: Former    Current packs/day: 0.00    Average packs/day: 2.0 packs/day for 25.0 years (50.0 ttl pk-yrs)    Types: Cigarettes    Start date: 03/11/1971    Quit date: 03/10/1996    Years since quitting: 27.1   Smokeless tobacco: Never  Vaping Use   Vaping status: Never Used  Substance Use Topics   Alcohol use: No    Alcohol/week: 0.0 standard drinks of alcohol    Comment: in the past drank, quit 1993   Drug use: No     Allergies   Bee venom and Penicillins   Review of Systems Review of Systems  Constitutional:  Positive for activity change, appetite change, fatigue and fever. Negative for chills.  HENT:  Positive for congestion. Negative for ear pain and sore throat.   Eyes:  Negative for pain and visual disturbance.  Respiratory:  Positive for cough and chest tightness. Negative for shortness of breath.   Cardiovascular:  Negative for chest pain and palpitations.  Gastrointestinal:  Negative for abdominal pain and vomiting.  Endocrine: Positive for cold intolerance.  Genitourinary:  Negative for dysuria and hematuria.   Musculoskeletal:  Negative for arthralgias and back pain.  Skin:  Negative for color change and rash.  Neurological:  Negative for seizures and syncope.  All other systems reviewed and are negative.    Physical Exam Triage Vital Signs ED Triage Vitals  Encounter Vitals Group     BP 04/22/23 1055 (!) 84/43     Systolic BP Percentile --      Diastolic BP Percentile --      Pulse Rate 04/22/23 1055 90     Resp 04/22/23 1055 18     Temp 04/22/23 1055 98.3 F (36.8 C)     Temp Source 04/22/23 1055 Oral     SpO2 04/22/23 1055 96 %     Weight 04/22/23 1055 179 lb 14.3 oz (81.6 kg)     Height 04/22/23 1055 5\' 9"  (1.753 m)     Head Circumference --      Peak Flow --      Pain Score 04/22/23 1051 0     Pain Loc --      Pain Education --      Exclude from Growth Chart --    No data found.  Updated Vital Signs BP (!) 94/56 (BP Location: Left Arm)   Pulse 90   Temp 98.3 F (36.8 C) (Oral)   Resp 18   Ht 5\' 9"  (1.753 m)   Wt 179 lb 14.3 oz (81.6 kg)   SpO2 96%   BMI 26.57 kg/m   Visual Acuity Right Eye Distance:   Left Eye Distance:   Bilateral Distance:    Right Eye Near:   Left Eye Near:    Bilateral Near:     Physical Exam Vitals and nursing note reviewed.  Constitutional:      General: He is not in acute distress.    Appearance: He is well-developed.  HENT:     Head: Normocephalic and atraumatic.     Right Ear: Tympanic membrane normal.     Left Ear: Tympanic membrane  normal.     Nose: Congestion present.     Mouth/Throat:     Mouth: Mucous membranes are moist.  Eyes:     Conjunctiva/sclera: Conjunctivae normal.  Cardiovascular:     Rate and Rhythm: Normal rate and regular rhythm.     Heart sounds: No murmur heard. Pulmonary:     Effort: Pulmonary effort is normal. No respiratory distress.     Breath sounds: Examination of the right-middle field reveals wheezing. Examination of the left-middle field reveals wheezing and rhonchi. Examination of the  left-lower field reveals rhonchi. Wheezing and rhonchi present.  Abdominal:     Palpations: Abdomen is soft.     Tenderness: There is no abdominal tenderness.  Musculoskeletal:        General: No swelling.     Cervical back: Neck supple.  Skin:    General: Skin is warm and dry.     Capillary Refill: Capillary refill takes less than 2 seconds.  Neurological:     Mental Status: He is alert.  Psychiatric:        Mood and Affect: Mood normal.      UC Treatments / Results  Labs (all labs ordered are listed, but only abnormal results are displayed) Labs Reviewed - No data to display  EKG   Radiology No results found.  Procedures Procedures (including critical care time)  Medications Ordered in UC Medications - No data to display  Initial Impression / Assessment and Plan / UC Course  I have reviewed the triage vital signs and the nursing notes.  Pertinent labs & imaging results that were available during my care of the patient were reviewed by me and considered in my medical decision making (see chart for details).     Acute cough - Plan: DG Chest 2 View, DG Chest 2 View  Weakness generalized - Plan: DG Chest 2 View, DG Chest 2 View  Acute non-recurrent maxillary sinusitis   Chest x-ray done today and final results showed no pneumonia or other abnormalities.  Given the symptoms and the severity, this seems to be most consistent with a maxillary sinus infection.  There may be a small aspect of bronchitis as well.  Given this we will treat with the following: Azithromycin 250mg  Take 2 tablets today and the 1 tablet daily for 4 more days. Prednisone 40 mg (2 tablets) once daily for 5 days. Take this in the morning.  This is a steroid to help with inflammation and pain.  Promethazine DM 5 mL every 8 hours as needed for cough.  Use caution as this medication can cause drowsiness. Rest and stay hydrated.   Return to urgent care or PCP if symptoms worsen or fail to resolve.     Final Clinical Impressions(s) / UC Diagnoses   Final diagnoses:  Acute cough  Weakness generalized   Discharge Instructions   None    ED Prescriptions   None    PDMP not reviewed this encounter.   Landis Martins, [place] 04/22/23 1203

## 2023-05-10 ENCOUNTER — Encounter: Payer: Self-pay | Admitting: Family

## 2023-05-18 ENCOUNTER — Ambulatory Visit

## 2023-05-18 VITALS — Ht 69.0 in | Wt 179.0 lb

## 2023-05-18 DIAGNOSIS — Z0000 Encounter for general adult medical examination without abnormal findings: Secondary | ICD-10-CM

## 2023-05-18 NOTE — Patient Instructions (Signed)
 [name] , Thank you for taking time to come for your Medicare Wellness Visit. I appreciate your ongoing commitment to your health goals. Please review the following plan we discussed and let me know if I can assist you in the future.   Referrals/Orders/Follow-Ups/Clinician Recommendations: none  This is a list of the screening recommended for you and due dates:  Health Maintenance  Topic Date Due   DTaP/Tdap/Td vaccine (2 - Td or Tdap) 11/11/2020   COVID-19 Vaccine (4 - 2024-25 season) 10/09/2022   Colon Cancer Screening  09/12/2023   Flu Shot  09/08/2023   Medicare Annual Wellness Visit  05/17/2024   Pneumonia Vaccine  Completed   Hepatitis C Screening  Completed   Zoster (Shingles) Vaccine  Completed   HPV Vaccine  Aged Out   Meningitis B Vaccine  Aged Out    Advanced directives: (Copy Requested) Please bring a copy of your health care power of attorney and living will to the office to be added to your chart at your convenience. You can mail to [facility] [address] [place], [place] [address] or email to [email or web address]  Next Medicare Annual Wellness Visit scheduled for next year: Yes 05/20/2024 @ 2:20pm televisit

## 2023-05-18 NOTE — Progress Notes (Cosign Needed Addendum)
 Please attest and cosign this visit due to patients primary care provider not being in the office at the time the visit was completed.    Subjective:   John Salazar is a 79 y.o. who presents for a Medicare Wellness preventive visit.  Visit Complete: Virtual I connected with  John Salazar on 05/18/23 by a audio enabled telemedicine application and verified that I am speaking with the correct person using two identifiers.  Patient Location: Home  Provider Location: Home Office  I discussed the limitations of evaluation and management by telemedicine. The patient expressed understanding and agreed to proceed.  Vital Signs: Because this visit was a virtual/telehealth visit, some criteria may be missing or patient reported. Any vitals not documented were not able to be obtained and vitals that have been documented are patient reported.  VideoDeclined- This patient declined Librarian, academic. Therefore the visit was completed with audio only.  Persons Participating in Visit: Patient.  AWV Questionnaire: No: Patient Medicare AWV questionnaire was not completed prior to this visit.  Cardiac Risk Factors include: advanced age (>2men, >51 women);dyslipidemia;hypertension;male gender     Objective:    Today's Vitals   05/18/23 1422  Weight: 179 lb (81.2 kg)  Height: 5\' 9"  (1.753 m)   Body mass index is 26.43 kg/m.     05/18/2023    2:41 PM 05/19/2022    2:16 PM 05/20/2021    8:35 AM 06/29/2020    6:57 AM 05/18/2020    6:46 AM 05/04/2020    6:29 PM 04/29/2020   10:17 AM  Advanced Directives  Does Patient Have a Medical Advance Directive? Yes Yes No Yes Yes Yes Yes  Type of Estate agent of [place];Living will Healthcare Power of [place];Living will  Healthcare Power of [place];Living will  Out of facility DNR (pink MOST or yellow form) Living will  Does patient want to make changes to medical advance directive?   Yes  (MAU/Ambulatory/Procedural Areas - Information given) No - Patient declined     Copy of Healthcare Power of Attorney in Chart? No - copy requested No - copy requested  No - copy requested       Current Medications (verified) Outpatient Encounter Medications as of 05/18/2023  Medication Sig   amLODipine (NORVASC) 2.5 MG tablet Take 1 tablet (2.5 mg total) by mouth daily. (Patient taking differently: Take 2.5 mg by mouth 2 (two) times daily.)   amLODipine (NORVASC) 5 MG tablet Take 5 mg by mouth daily.   aspirin EC 81 MG tablet Take 1 tablet (81 mg total) by mouth daily. Swallow whole.   clopidogrel (PLAVIX) 75 MG tablet TAKE ONE TABLET BY MOUTH DAILY   escitalopram (LEXAPRO) 20 MG tablet Take 1 tablet (20 mg total) by mouth daily.   famotidine (PEPCID) 20 MG tablet Take 20 mg by mouth daily as needed for heartburn or indigestion.   isosorbide mononitrate (IMDUR) 30 MG 24 hr tablet Take 0.5 tablets (15 mg total) by mouth daily. AS NEEDED   losartan (COZAAR) 50 MG tablet Take 0.5 tablets (25 mg total) by mouth daily. (Patient taking differently: Take 50 mg by mouth daily.)   nitroGLYCERIN (NITROSTAT) 0.4 MG SL tablet Place 1 tablet (0.4 mg total) under the tongue every 5 (five) minutes as needed for chest pain.   rosuvastatin (CRESTOR) 40 MG tablet Take one (1) tablet by mouth (40 mg) daily.   tamsulosin (FLOMAX) 0.4 MG CAPS capsule Take one capsule once daily ( thirty  minutes after same meal daily)   pantoprazole (PROTONIX) 40 MG tablet Take 1 tablet (40 mg total) by mouth 2 (two) times daily. (Patient not taking: Reported on 05/18/2023)   No facility-administered encounter medications on file as of 05/18/2023.    Allergies (verified) Bee venom and Penicillins   History: Past Medical History:  Diagnosis Date   Anxiety    Ascending aorta dilation (HCC) 08/22/2021   Echocardiogram 04/2021: 40 mm   Barrett esophagus    BPH (benign prostatic hyperplasia)    Cerebral aneurysm    Chronic kidney  disease    stage 2   Colon polyps    Coronary artery disease    mild-mod non-obstructive CAD   Erectile dysfunction    GERD (gastroesophageal reflux disease)    History of kidney stones    History of stomach ulcers    Hypercholesteremia    Hypertension    Positive TB test    in the past   Past Surgical History:  Procedure Laterality Date   APPENDECTOMY  1970   CATARACT EXTRACTION W/ INTRAOCULAR LENS  IMPLANT, BILATERAL     COLONOSCOPY  10/2012   ESOPHAGOGASTRODUODENOSCOPY ENDOSCOPY  10/2012   HERNIA REPAIR     IR 3D INDEPENDENT WKST  04/14/2020   IR ANGIO INTRA EXTRACRAN SEL COM CAROTID INNOMINATE BILAT MOD SED  08/09/2017   IR ANGIO INTRA EXTRACRAN SEL COM CAROTID INNOMINATE BILAT MOD SED  11/08/2018   IR ANGIO INTRA EXTRACRAN SEL COM CAROTID INNOMINATE BILAT MOD SED  04/14/2020   IR ANGIO INTRA EXTRACRAN SEL COM CAROTID INNOMINATE UNI L MOD SED  06/29/2020   IR ANGIO INTRA EXTRACRAN SEL INTERNAL CAROTID UNI L MOD SED  05/20/2020   IR ANGIO VERTEBRAL SEL VERTEBRAL BILAT MOD SED  08/09/2017   IR ANGIO VERTEBRAL SEL VERTEBRAL BILAT MOD SED  11/08/2018   IR ANGIO VERTEBRAL SEL VERTEBRAL BILAT MOD SED  04/14/2020   IR ANGIOGRAM FOLLOW UP STUDY  05/20/2020   IR INTRAVSC STENT CERV CAROTID W/EMB-PROT MOD SED INCL ANGIO  08/23/2017   IR INTRAVSC STENT CERV CAROTID W/EMB-PROT MOD SED INCL ANGIO  06/29/2020   IR RADIOLOGIST EVAL & MGMT  05/05/2020   IR RADIOLOGIST EVAL & MGMT  06/03/2020   IR RADIOLOGIST EVAL & MGMT  07/15/2020   IR TRANSCATH/EMBOLIZ  05/18/2020   IR US GUIDE VASC ACCESS RIGHT  04/14/2020   IR US GUIDE VASC ACCESS RIGHT  05/20/2020   IR US GUIDE VASC ACCESS RIGHT  06/29/2020   LEFT HEART CATH AND CORONARY ANGIOGRAPHY N/A 11/15/2018   Procedure: LEFT HEART CATH AND CORONARY ANGIOGRAPHY;  Surgeon: Yvonne Kendall, MD;  Location: [facility];  Service: Cardiovascular;  Laterality: N/A;   PROSTATE SURGERY  1999   20 years ago, enlarged prostate   RADIOLOGY WITH ANESTHESIA N/A 08/23/2017    Procedure: IR WITH ANESTHESIA STENT PLACEMENT;  Surgeon: Julieanne Cotton, MD;  Location: [facility] OR;  Service: Radiology;  Laterality: N/A;   RADIOLOGY WITH ANESTHESIA N/A 05/18/2020   Procedure: IR WITH ANESTHESIA EMBOLIZATION;  Surgeon: Julieanne Cotton, MD;  Location: [facility] OR;  Service: Radiology;  Laterality: N/A;   RADIOLOGY WITH ANESTHESIA Left 06/29/2020   Procedure: RADIOLOGY WITH ANESTHESIA  LEFT CAROTID STENT PLACEMENT;  Surgeon: Julieanne Cotton, MD;  Location: [facility] OR;  Service: Radiology;  Laterality: Left;   Family History  Problem Relation Age of Onset   Stroke Mother 59   Aneurysm Father 11   Social History   Socioeconomic History  Marital status: Married    Spouse name: pamela   Number of children: 2   Years of education: 12   Highest education level: Not on file  Occupational History   Occupation: Comptroller.  Tobacco Use   Smoking status: Former    Current packs/day: 0.00    Average packs/day: 2.0 packs/day for 25.0 years (50.0 ttl pk-yrs)    Types: Cigarettes    Start date: 03/11/1971    Quit date: 03/10/1996    Years since quitting: 27.2   Smokeless tobacco: Never  Vaping Use   Vaping status: Never Used  Substance and Sexual Activity   Alcohol use: No    Alcohol/week: 0.0 standard drinks of alcohol    Comment: in the past drank, quit 1993   Drug use: No   Sexual activity: Not Currently  Other Topics Concern   Not on file  Social History Narrative   Lives with Rinaldo Cloud (Wife) - together for 28 years   Children - French Ana and Raynelle Fanning -- one is a Engineer, civil (consulting) in [facility] and other works for [employer] - 17, lives in [place]   Enjoys - playing golf, goes to [employer] and sponsors people as well, church   Support - family, good friends in [place]   Exercise - walks the dog, runs up hills   Diet - improved from before, tries to drink water - low salt or no salt      Retail buyer for [employer]    Social Drivers of Health   Financial Resource  Strain: Low Risk  (05/18/2023)   Overall Financial Resource Strain (CARDIA)    Difficulty of Paying Living Expenses: Not hard at all  Food Insecurity: No Food Insecurity (05/18/2023)   Hunger Vital Sign    Worried About Running Out of Food in the Last Year: Never true    Ran Out of Food in the Last Year: Never true  Transportation Needs: No Transportation Needs (05/18/2023)   PRAPARE - Administrator, Civil Service (Medical): No    Lack of Transportation (Non-Medical): No  Physical Activity: Sufficiently Active (05/18/2023)   Exercise Vital Sign    Days of Exercise per Week: 5 days    Minutes of Exercise per Session: 30 min  Stress: No Stress Concern Present (05/18/2023)   [employer] of Occupational Health - Occupational Stress Questionnaire    Feeling of Stress : Not at all  Social Connections: Socially Integrated (05/18/2023)   Social Connection and Isolation Panel [NHANES]    Frequency of Communication with Friends and Family: More than three times a week    Frequency of Social Gatherings with Friends and Family: More than three times a week    Attends Religious Services: More than 4 times per year    Active Member of [employer] or Organizations: Yes    Attends Engineer, structural: More than 4 times per year    Marital Status: Married    Tobacco Counseling Counseling given: Not Answered    Clinical Intake:  Pre-visit preparation completed: No  Pain : No/denies pain     BMI - recorded: 26.43 Nutritional Status: BMI 25 -29 Overweight Nutritional Risks: None Diabetes: No  Lab Results  Component Value Date   HGBA1C 5.9 (H) 11/28/2022   HGBA1C 5.9 05/23/2022   HGBA1C 5.9 05/20/2021     How often do you need to have someone help you when you read instructions, pamphlets, or other written materials from  your doctor or pharmacy?: 1 - Never  Interpreter Needed?: No  Comments: lives with wife Information entered by :: B.Jafet Wissing,LPN   Activities  of Daily Living     05/18/2023    2:42 PM 05/19/2022    2:17 PM  In your present state of health, do you have any difficulty performing the following activities:  Hearing? 1 0  Vision? 0 0  Difficulty concentrating or making decisions? 0 0  Walking or climbing stairs? 0 0  Dressing or bathing? 0 0  Doing errands, shopping? 0 0  Preparing Food and eating ? N N  Using the Toilet? N N  In the past six months, have you accidently leaked urine? N N  Do you have problems with loss of bowel control? N N  Managing your Medications? N N  Managing your Finances? N N  Housekeeping or managing your Housekeeping? N N    Patient Care Team: Mort Sawyers, FNP as PCP - General (Family Medicine) Pricilla Riffle, MD as PCP - Cardiology (Cardiology) August Saucer Corrie Mckusick, MD as Consulting Physician (Orthopedic Surgery) Julieanne Cotton, MD as Consulting Physician (Interventional Radiology) Vida Rigger, MD as Consulting Physician (Gastroenterology) Kathyrn Sheriff, [facility] (Inactive) as Pharmacist (Pharmacist) Pricilla Riffle, MD as Consulting Physician (Cardiology) Jenel Lucks, MD as Consulting Physician (Gastroenterology)  Indicate any recent Medical Services you may have received from other than [facility] providers in the past year (date may be approximate).     Assessment:   This is a routine wellness examination for Watkins.  Hearing/Vision screen Hearing Screening - Comments:: Pt says hearing is good with hearing aids most times Vision Screening - Comments:: Pt says his vision is good w/glasses  [facility]   Goals Addressed             This Visit's Progress    Exercise 3x per week (30 min per time)   On track    05/18/23 continue     Patient Stated   On track    05/18/23-Treat knee pain     Patient Stated   On track    05/18/23-Lose weight, and exercise more.     Track and Manage My Blood Pressure-Hypertension   On track    Timeframe:  Long-Range Goal Priority:   Medium Start Date:     03/22/21                        Expected End Date:   03/22/22                    Follow Up Date April 2023   - check blood pressure daily - choose a place to take my blood pressure (home, clinic or office, retail store) - write blood pressure results in a log or diary    Why is this important?   You won't feel high blood pressure, but it can still hurt your blood vessels.  High blood pressure can cause heart or kidney problems. It can also cause a stroke.  Making lifestyle changes like losing a little weight or eating less salt will help.  Checking your blood pressure at home and at different times of the day can help to control blood pressure.  If the doctor prescribes medicine remember to take it the way the doctor ordered.  Call the office if you cannot afford the medicine or if there are questions about it.     Notes:  Depression Screen     05/18/2023    2:33 PM 03/15/2023   10:16 AM 06/10/2022   10:13 AM 05/23/2022    8:19 AM 05/19/2022    2:14 PM 09/24/2021    1:03 PM 05/20/2021    9:24 AM  PHQ 2/9 Scores  PHQ - 2 Score 0 0 3 0 0 0 1  PHQ- 9 Score  4 6 7  3 5     Fall Risk     05/18/2023    2:25 PM 03/15/2023    9:37 AM 06/10/2022   10:12 AM 05/23/2022    8:19 AM 05/19/2022    2:09 PM  Fall Risk   Falls in the past year? 0 0 0 1 0  Number falls in past yr: 0 0 0 0 0  Injury with Fall? 0 0 0 0 0  Risk for fall due to : No Fall Risks No Fall Risks  History of fall(s) No Fall Risks  Follow up Education provided;Falls prevention discussed Falls evaluation completed Falls evaluation completed;Education provided;Falls prevention discussed Falls prevention discussed;Education provided;Falls evaluation completed Falls prevention discussed;Falls evaluation completed    MEDICARE RISK AT HOME:  Medicare Risk at Home Any stairs in or around the home?: Yes If so, are there any without handrails?: Yes Home free of loose throw rugs in walkways, pet beds,  electrical cords, etc?: Yes Adequate lighting in your home to reduce risk of falls?: Yes Life alert?: No Use of a cane, walker or w/c?: No Grab bars in the bathroom?: No Shower chair or bench in shower?: Yes Elevated toilet seat or a handicapped toilet?: Yes  TIMED UP AND GO:  Was the test performed?  No  Cognitive Function: 6CIT completed      03/15/2023   10:02 AM  Montreal Cognitive Assessment   Visuospatial/ Executive (0/5) 5  Naming (0/3) 3  Attention: Read list of digits (0/2) 2  Attention: Read list of letters (0/1) 1  Attention: Serial 7 subtraction starting at 100 (0/3) 3  Language: Repeat phrase (0/2) 2  Language : Fluency (0/1) 1  Abstraction (0/2) 2  Delayed Recall (0/5) 1  Orientation (0/6) 6  Total 26  Adjusted Score (based on education) 26      05/18/2023    2:43 PM 05/19/2022    2:19 PM  6CIT Screen  What Year? 0 points 0 points  What month? 0 points 0 points  What time? 0 points 0 points  Count back from 20 0 points 0 points  Months in reverse 0 points 0 points  Repeat phrase 0 points 0 points  Total Score 0 points 0 points    Immunizations Immunization History  Administered Date(s) Administered   Fluad Quad(high Dose 65+) 10/04/2018, 12/07/2019, 11/23/2020   Influenza, High Dose Seasonal PF 11/07/2017   Influenza-Unspecified 11/07/2022   PFIZER(Purple Top)SARS-COV-2 Vaccination 03/16/2019, 04/08/2019, 08/18/2020   Pneumococcal Conjugate-13 07/26/2013   Pneumococcal Polysaccharide-23 11/19/2009   Pneumococcal-Unspecified 03/24/2017   RSV,unspecified 11/07/2022   Tdap 11/12/2010   Zoster Recombinant(Shingrix) 09/27/2018, 12/27/2018    Screening Tests Health Maintenance  Topic Date Due   DTaP/Tdap/Td (2 - Td or Tdap) 11/11/2020   COVID-19 Vaccine (4 - 2024-25 season) 10/09/2022   Colonoscopy  09/12/2023   INFLUENZA VACCINE  09/08/2023   Medicare Annual Wellness (AWV)  05/17/2024   Pneumonia Vaccine 69+ Years old  Completed   Hepatitis C  Screening  Completed   Zoster Vaccines- Shingrix  Completed   HPV VACCINES  Aged Out  Meningococcal B Vaccine  Aged Out    Health Maintenance  Health Maintenance Due  Topic Date Due   DTaP/Tdap/Td (2 - Td or Tdap) 11/11/2020   COVID-19 Vaccine (4 - 2024-25 season) 10/09/2022   Colonoscopy  09/12/2023   Health Maintenance Items Addressed: None needed  Additional Screening:  Vision Screening: Recommended annual ophthalmology exams for early detection of glaucoma and other disorders of the eye.  Dental Screening: Recommended annual dental exams for proper oral hygiene  Community Resource Referral / Chronic Care Management: CRR required this visit?  No   CCM required this visit?  Appt scheduled with PCP     Plan:     I have personally reviewed and noted the following in the patient's chart:   Medical and social history Use of alcohol, tobacco or illicit drugs  Current medications and supplements including opioid prescriptions. Patient is not currently taking opioid prescriptions. Functional ability and status Nutritional status Physical activity Advanced directives List of other physicians Hospitalizations, surgeries, and ER visits in previous 12 months Vitals Screenings to include cognitive, depression, and falls Referrals and appointments  In addition, I have reviewed and discussed with patient certain preventive protocols, quality metrics, and best practice recommendations. A written personalized care plan for preventive services as well as general preventive health recommendations were provided to patient.     Sue Lush, LPN   5/40/9811   After Visit Summary: (MyChart) Due to this being a telephonic visit, the after visit summary with patients personalized plan was offered to patient via MyChart   Notes: Nothing significant to report at this time.

## 2023-06-05 ENCOUNTER — Telehealth: Payer: Self-pay | Admitting: Internal Medicine

## 2023-06-05 DIAGNOSIS — G4733 Obstructive sleep apnea (adult) (pediatric): Secondary | ICD-10-CM

## 2023-06-05 NOTE — Telephone Encounter (Signed)
 Patient would like order for CPAP machine. Patient phone number is [phone].

## 2023-06-05 NOTE — Telephone Encounter (Signed)
 Called patient.  Per Dr. Linder Revere, okay to start patient on CPAP.  Use order given at patient message for HST done by [facility] on  08/21/2022.   Date of last OV was 11/14/2022.    Result given to patient on 10/07/2022:  Rosa College D, MD     10/07/22  9:27 AM Note Home sleep test indicates severe obstructive slep apnea, averaging 39 apneas/ hour   I recommend we order new DME, new CPAP auto 5-20, mask of choice, humidifier, supplies, AirView/ card     Order placed.  Patient notified.

## 2023-06-18 ENCOUNTER — Encounter: Payer: Self-pay | Admitting: Physician Assistant

## 2023-06-18 ENCOUNTER — Telehealth: Admitting: Physician Assistant

## 2023-06-18 DIAGNOSIS — R051 Acute cough: Secondary | ICD-10-CM

## 2023-06-18 NOTE — Patient Instructions (Addendum)
 John Salazar, thank you for joining [name], PA-C for today's virtual visit.  While this provider is not your primary care provider (PCP), if your PCP is located in our provider database this encounter information will be shared with them immediately following your visit.   A [facility] MyChart account gives you access to today's visit and all your visits, tests, and labs performed at [facility] " click here if you don't have a [facility] MyChart account or go to [email or web address]  Consent: (Patient) John Salazar provided verbal consent for this virtual visit at the beginning of the encounter.  Current Medications:  Current Outpatient Medications:    amLODipine  (NORVASC ) 2.5 MG tablet, Take 1 tablet (2.5 mg total) by mouth daily. (Patient taking differently: Take 2.5 mg by mouth 2 (two) times daily.), Disp: 180 tablet, Rfl: 3   amLODipine  (NORVASC ) 5 MG tablet, Take 5 mg by mouth daily., Disp: , Rfl:    aspirin  EC 81 MG tablet, Take 1 tablet (81 mg total) by mouth daily. Swallow whole., Disp: 90 tablet, Rfl: 3   clopidogrel  (PLAVIX ) 75 MG tablet, TAKE ONE TABLET BY MOUTH DAILY, Disp: 90 tablet, Rfl: 3   escitalopram  (LEXAPRO ) 20 MG tablet, Take 1 tablet (20 mg total) by mouth daily., Disp: 90 tablet, Rfl: 3   famotidine (PEPCID) 20 MG tablet, Take 20 mg by mouth daily as needed for heartburn or indigestion., Disp: , Rfl:    isosorbide  mononitrate (IMDUR ) 30 MG 24 hr tablet, Take 0.5 tablets (15 mg total) by mouth daily. AS NEEDED, Disp: 30 tablet, Rfl: 6   losartan  (COZAAR ) 50 MG tablet, Take 0.5 tablets (25 mg total) by mouth daily. (Patient taking differently: Take 50 mg by mouth daily.), Disp: , Rfl:    nitroGLYCERIN  (NITROSTAT ) 0.4 MG SL tablet, Place 1 tablet (0.4 mg total) under the tongue every 5 (five) minutes as needed for chest pain., Disp: 25 tablet, Rfl: prn   pantoprazole  (PROTONIX ) 40 MG tablet, Take 1 tablet (40 mg total) by mouth 2 (two) times  daily. (Patient not taking: Reported on 05/18/2023), Disp: 90 tablet, Rfl: 3   rosuvastatin  (CRESTOR ) 40 MG tablet, Take one (1) tablet by mouth (40 mg) daily., Disp: 90 tablet, Rfl: 3   tamsulosin  (FLOMAX ) 0.4 MG CAPS capsule, Take one capsule once daily ( thirty minutes after same meal daily), Disp: , Rfl:    Medications ordered in this encounter:  No orders of the defined types were placed in this encounter.    *If you need refills on other medications prior to your next appointment, please contact your pharmacy*  Follow-Up: Call back or seek an in-person evaluation if the symptoms worsen or if the condition fails to improve as anticipated.  [facility] [phone]  Other Instructions  - Recommend urgent in-person evaluation   If you have been instructed to have an in-person evaluation today at a local Urgent Care facility, please use the link below. It will take you to a list of all of our available [facility], including address, phone number and hours of operation. Please do not delay care.  [facility]  If you or a family member do not have a primary care provider, use the link below to schedule a visit and establish care. When you choose a  primary care physician or advanced practice provider, you gain a long-term partner in health. Find a Primary Care Provider  Learn more about [facility]'s in-office and virtual care options: [facility] - Get Care Now

## 2023-06-18 NOTE — Progress Notes (Signed)
 Virtual Visit Consent   John Salazar, you are scheduled for a virtual visit with a [facility] provider today. Just as with appointments in the office, your consent must be obtained to participate. Your consent will be active for this visit and any virtual visit you may have with one of our providers in the next 365 days. If you have a MyChart account, a copy of this consent can be sent to you electronically.  As this is a virtual visit, video technology does not allow for your provider to perform a traditional examination. This may limit your provider's ability to fully assess your condition. If your provider identifies any concerns that need to be evaluated in person or the need to arrange testing (such as labs, EKG, etc.), we will make arrangements to do so. Although advances in technology are sophisticated, we cannot ensure that it will always work on either your end or our end. If the connection with a video visit is poor, the visit may have to be switched to a telephone visit. With either a video or telephone visit, we are not always able to ensure that we have a secure connection.  By engaging in this virtual visit, you consent to the provision of healthcare and authorize for your insurance to be billed (if applicable) for the services provided during this visit. Depending on your insurance coverage, you may receive a charge related to this service.  I need to obtain your verbal consent now. Are you willing to proceed with your visit today? John Salazar has provided verbal consent on 06/18/2023 for a virtual visit (video or telephone). John Scriver, PA-C  Date: 06/18/2023 6:28 PM   Virtual Visit via Video Note   I, John Salazar, connected with  John Salazar  ([id number], Feb 13, 1945) on 06/18/23 at  6:15 PM EDT by a video-enabled telemedicine application and verified that I am speaking with the correct person using two identifiers.  Location: Patient: Virtual Visit Location Patient:  Home Provider: Virtual Visit Location Provider: Home Office   I discussed the limitations of evaluation and management by telemedicine and the availability of in person appointments. The patient expressed understanding and agreed to proceed.    History of Present Illness: John Salazar is a 78 y.o. who identifies as a male who was assigned male at birth,  with a history of stents and aneurysm who presents with severe cough and cold symptoms.  He has a severe cough with significant greenish-yellow mucus production. The cough is intense and causes difficulty lying down due to a choking sensation. He feels cold despite a room temperature of 62F and has a temperature of 99.44F. Recent exposure to individuals with bronchitis and unspecified illnesses is noted. He has used Tylenol , increased fluids, rest, and leftover prescription cough syrup without relief. (promethazine  DM cough syrup.) No recent home COVID test has been conducted since the onset of symptoms.   Problems:  Patient Active Problem List   Diagnosis Date Noted   Short-term memory loss 03/15/2023   Gastroesophageal reflux disease with esophagitis without hemorrhage 03/15/2023   Gastropathy 03/15/2023   Trigger index finger of left hand 03/15/2023   OSA (obstructive sleep apnea) 11/14/2022   Depression, major, single episode, moderate (HCC) 06/10/2022   Hx of long term use of blood thinners 06/10/2022   Vitamin B12 deficiency 05/23/2022   Coronary artery disease involving native coronary artery of native heart with angina pectoris (HCC) 08/22/2021   Carotid artery disease (HCC) 08/22/2021  Ascending aorta dilation (HCC) 08/22/2021   Incomplete RBBB 08/05/2021   Chronic diarrhea 11/23/2020   Internal carotid artery stenosis, right 06/29/2020   Brain aneurysm 05/18/2020   Snoring 02/27/2019   Accelerating angina (HCC) 11/15/2018   DNR (do not resuscitate) 08/23/2018   Hx of cataract surgery 08/23/2018   History of right common  carotid artery stent placement 06/18/2018   Prediabetes 03/19/2018   Pain in right knee 03/05/2018   CKD (chronic kidney disease) stage 3, GFR 30-59 ml/min (HCC) 02/05/2018   Barrett esophagus 01/08/2018   Benign essential HTN 01/08/2018   BPH (benign prostatic hyperplasia) 01/08/2018   Hyperlipidemia LDL goal <70 01/08/2018   Anemia 01/08/2018   Imbalance 06/26/2017    Allergies:  Allergies  Allergen Reactions   Bee Venom Anaphylaxis and Shortness Of Breath   Penicillins Rash    PATIENT HAS HAD A PCN REACTION WITH IMMEDIATE RASH, FACIAL/TONGUE/THROAT SWELLING, SOB, OR LIGHTHEADEDNESS WITH HYPOTENSION:  #  #  YES  #  #   Has patient had a PCN reaction causing severe rash involving mucus membranes or skin necrosis: no  Has patient had a PCN reaction that required hospitalization: no  Has patient had a PCN reaction occurring within the last 10 years: no  Other Reaction(s): Not available   Medications:  Current Outpatient Medications:    amLODipine  (NORVASC ) 2.5 MG tablet, Take 1 tablet (2.5 mg total) by mouth daily. (Patient taking differently: Take 2.5 mg by mouth 2 (two) times daily.), Disp: 180 tablet, Rfl: 3   amLODipine  (NORVASC ) 5 MG tablet, Take 5 mg by mouth daily., Disp: , Rfl:    aspirin  EC 81 MG tablet, Take 1 tablet (81 mg total) by mouth daily. Swallow whole., Disp: 90 tablet, Rfl: 3   clopidogrel  (PLAVIX ) 75 MG tablet, TAKE ONE TABLET BY MOUTH DAILY, Disp: 90 tablet, Rfl: 3   escitalopram  (LEXAPRO ) 20 MG tablet, Take 1 tablet (20 mg total) by mouth daily., Disp: 90 tablet, Rfl: 3   famotidine (PEPCID) 20 MG tablet, Take 20 mg by mouth daily as needed for heartburn or indigestion., Disp: , Rfl:    isosorbide  mononitrate (IMDUR ) 30 MG 24 hr tablet, Take 0.5 tablets (15 mg total) by mouth daily. AS NEEDED, Disp: 30 tablet, Rfl: 6   losartan  (COZAAR ) 50 MG tablet, Take 0.5 tablets (25 mg total) by mouth daily. (Patient taking differently: Take 50 mg by mouth daily.), Disp:  , Rfl:    nitroGLYCERIN  (NITROSTAT ) 0.4 MG SL tablet, Place 1 tablet (0.4 mg total) under the tongue every 5 (five) minutes as needed for chest pain., Disp: 25 tablet, Rfl: prn   pantoprazole  (PROTONIX ) 40 MG tablet, Take 1 tablet (40 mg total) by mouth 2 (two) times daily. (Patient not taking: Reported on 05/18/2023), Disp: 90 tablet, Rfl: 3   rosuvastatin  (CRESTOR ) 40 MG tablet, Take one (1) tablet by mouth (40 mg) daily., Disp: 90 tablet, Rfl: 3   tamsulosin  (FLOMAX ) 0.4 MG CAPS capsule, Take one capsule once daily ( thirty minutes after same meal daily), Disp: , Rfl:   Observations/Objective: Patient is well-developed, well-nourished in no acute distress.  Resting comfortably  at home.  Head is normocephalic, atraumatic.  No labored breathing.  Speech is clear and coherent with logical content.  Patient is alert and oriented at baseline.    Assessment and Plan: 1. Acute cough (Primary)   Severe cough with greenish-yellow sputum, chills, and subjective fever. Possible exposure to bronchitis. Differential includes bronchitis, pneumonia, or other respiratory infections.  -  Recommend urgent in-person evaluation    Follow Up Instructions: I discussed the assessment and treatment plan with the patient. The patient was provided an opportunity to ask questions and all were answered. The patient agreed with the plan and demonstrated an understanding of the instructions.  A copy of instructions were sent to the patient via MyChart unless otherwise noted below.     The patient was advised to call back or seek an in-person evaluation if the symptoms worsen or if the condition fails to improve as anticipated.    Etter Hermann Mayers, PA-C

## 2023-06-21 ENCOUNTER — Other Ambulatory Visit: Payer: Self-pay | Admitting: Internal Medicine

## 2023-06-21 DIAGNOSIS — G4733 Obstructive sleep apnea (adult) (pediatric): Secondary | ICD-10-CM | POA: Diagnosis not present

## 2023-06-27 ENCOUNTER — Ambulatory Visit (INDEPENDENT_AMBULATORY_CARE_PROVIDER_SITE_OTHER): Admitting: Nurse Practitioner

## 2023-06-27 ENCOUNTER — Telehealth: Payer: Self-pay | Admitting: Family

## 2023-06-27 ENCOUNTER — Ambulatory Visit (INDEPENDENT_AMBULATORY_CARE_PROVIDER_SITE_OTHER)
Admission: RE | Admit: 2023-06-27 | Discharge: 2023-06-27 | Disposition: A | Discharge status: Home / Self Care | Source: Ambulatory Visit | Attending: Nurse Practitioner | Admitting: Nurse Practitioner

## 2023-06-27 VITALS — BP 120/70 | HR 73 | Temp 97.8°F | Ht 69.0 in | Wt 175.0 lb

## 2023-06-27 DIAGNOSIS — R051 Acute cough: Secondary | ICD-10-CM | POA: Insufficient documentation

## 2023-06-27 DIAGNOSIS — H9120 Sudden idiopathic hearing loss, unspecified ear: Secondary | ICD-10-CM | POA: Insufficient documentation

## 2023-06-27 DIAGNOSIS — J40 Bronchitis, not specified as acute or chronic: Secondary | ICD-10-CM

## 2023-06-27 DIAGNOSIS — R059 Cough, unspecified: Secondary | ICD-10-CM | POA: Diagnosis not present

## 2023-06-27 DIAGNOSIS — I700 Atherosclerosis of aorta: Secondary | ICD-10-CM | POA: Diagnosis not present

## 2023-06-27 MED ORDER — PREDNISONE 20 MG PO TABS: 40.0000 mg | ORAL_TABLET | Freq: Every day | ORAL | 0 refills | Status: DC

## 2023-06-27 MED ORDER — FLUTICASONE PROPIONATE 50 MCG/ACT NA SUSP: 2.0000 | Freq: Every day | NASAL | 0 refills | Status: DC

## 2023-06-27 NOTE — Assessment & Plan Note (Signed)
 He continues a Mucinex and promethazine  cough syrup as prescribed.  Pending chest x-ray today

## 2023-06-27 NOTE — Patient Instructions (Signed)
 Nice to see you today I have sent in a nasal spray and steroids If the hearing does not improve let me know and we will send you to Ear, Nose, and Throat doctor I will be in touch with xray results We will hold off on antibiotics for the time being

## 2023-06-27 NOTE — Assessment & Plan Note (Signed)
 Will do prednisone  to see if this is beneficial.  Ear exam benign.  Patient had some recovery on his own this morning.  If no improvement with treatment of steroids consider ENT referral

## 2023-06-27 NOTE — Assessment & Plan Note (Signed)
 Obtain chest x-ray.  Will do steroids to help with ear dysfunction and hearing loss.  Patient seems to be improving on his own.  If x-ray is negative continue supportive treatment

## 2023-06-27 NOTE — Telephone Encounter (Signed)
 During checkout, pt asked if John Salazar would accept him as a pt? Pt is current pt of John Salazar. Is this TOC okay? Call back # [phone]

## 2023-06-27 NOTE — Progress Notes (Signed)
 Acute Office Visit  Subjective:     Patient ID: John Salazar, male    DOB: Mar 07, 1945, 78 y.o.   MRN: [id number]  Chief Complaint  Patient presents with   Follow-up    Pt complains of cold ongoing for 12 days. States of productive cough and losing hearing in right ear. Pt wants to know if he has pneumonia.     HPI Patient is in today for sick symptoms with a histoyr of CAD, HTN, OSA,CKD, BPH, Prediabetes,HLD,   Covid vaccine Flu vaccine   Symptoms has been present for 12 days States that no known of sick contacts He has been trying mucinex and promethazine  cough medication that did seem to help  States that approx 1 week ago he lost hearing in the right year. States that he could hear this morning but not back to his base line as of yet   Review of Systems  Constitutional:  Positive for malaise/fatigue. Negative for chills and fever.  HENT:  Positive for congestion and hearing loss. Negative for ear pain and sore throat.   Respiratory:  Positive for cough and sputum production. Negative for shortness of breath.   Gastrointestinal:  Negative for abdominal pain, diarrhea, nausea and vomiting.  Musculoskeletal:  Negative for joint pain and myalgias.  Neurological:  Negative for headaches.        Objective:    BP 120/70   Pulse 73   Temp 97.8 F (36.6 C) (Oral)   Ht 5\' 9"  (1.753 m)   Wt 175 lb (79.4 kg)   SpO2 97%   BMI 25.84 kg/m    Physical Exam Vitals and nursing note reviewed.  Constitutional:      Appearance: Normal appearance.  HENT:     Right Ear: Tympanic membrane, ear canal and external ear normal.     Left Ear: Tympanic membrane, ear canal and external ear normal.     Nose:     Right Sinus: No maxillary sinus tenderness or frontal sinus tenderness.     Left Sinus: No maxillary sinus tenderness or frontal sinus tenderness.     Mouth/Throat:     Mouth: Mucous membranes are moist.     Pharynx: Oropharynx is clear.  Cardiovascular:     Rate and  Rhythm: Normal rate and regular rhythm.     Heart sounds: Normal heart sounds.  Pulmonary:     Effort: Pulmonary effort is normal.     Breath sounds: Normal breath sounds.  Lymphadenopathy:     Cervical: No cervical adenopathy.  Neurological:     Mental Status: He is alert.     No results found for any visits on 06/27/23.      Assessment & Plan:   Problem List Items Addressed This Visit       Respiratory   Bronchitis   Obtain chest x-ray.  Will do steroids to help with ear dysfunction and hearing loss.  Patient seems to be improving on his own.  If x-ray is negative continue supportive treatment      Relevant Orders   DG Chest 2 View     Nervous and Auditory   Sudden idiopathic hearing loss   Will do prednisone  to see if this is beneficial.  Ear exam benign.  Patient had some recovery on his own this morning.  If no improvement with treatment of steroids consider ENT referral      Relevant Medications   predniSONE  (DELTASONE ) 20 MG tablet   fluticasone (FLONASE) 50 MCG/ACT  nasal spray     Other   Acute cough - Primary   He continues a Mucinex and promethazine  cough syrup as prescribed.  Pending chest x-ray today      Relevant Orders   DG Chest 2 View    Meds ordered this encounter  Medications   predniSONE  (DELTASONE ) 20 MG tablet    Sig: Take 2 tablets (40 mg total) by mouth daily with breakfast.    Dispense:  10 tablet    Refill:  0    Supervising Provider:   Deri Fleet A [[id number]]   fluticasone (FLONASE) 50 MCG/ACT nasal spray    Sig: Place 2 sprays into both nostrils daily.    Dispense:  16 g    Refill:  0    Supervising Provider:   Deri Fleet A [[id number]]    Return if symptoms worsen or fail to improve.  Margarie Shay, NP

## 2023-06-27 NOTE — Telephone Encounter (Signed)
 Ok by me

## 2023-06-28 NOTE — Telephone Encounter (Signed)
 Yes TOC is ok with me.

## 2023-06-29 ENCOUNTER — Ambulatory Visit: Payer: Self-pay | Admitting: Nurse Practitioner

## 2023-06-29 ENCOUNTER — Encounter: Payer: Self-pay | Admitting: Family

## 2023-07-03 NOTE — Telephone Encounter (Signed)
 Can you make sure his CPX is w matt rather than I?  We are TOC per pt request

## 2023-07-04 NOTE — Telephone Encounter (Signed)
 Any chance you'd want to add him in maybe for a 40 minute appt for an earlier physical then you have openings for?   I also don't mind seeing him if you'd rather me just see him and wait for [facility] for his next f/u. He did mention he had a 'male' concern he would rather discuss with a male provider.

## 2023-07-04 NOTE — Telephone Encounter (Signed)
lvm for pt to call office to reschedule appt

## 2023-07-06 NOTE — Telephone Encounter (Signed)
 R/s pt's cpe to matt on 08/25/23

## 2023-07-06 NOTE — Telephone Encounter (Signed)
 We can put him in a 20 min appt around the same time that he was going to see Tabithia for a CPE with me

## 2023-07-12 ENCOUNTER — Other Ambulatory Visit: Payer: Self-pay

## 2023-07-12 ENCOUNTER — Encounter: Payer: Self-pay | Admitting: Surgical

## 2023-07-12 ENCOUNTER — Ambulatory Visit: Admitting: Surgical

## 2023-07-12 DIAGNOSIS — M654 Radial styloid tenosynovitis [de Quervain]: Secondary | ICD-10-CM

## 2023-07-12 DIAGNOSIS — M25532 Pain in left wrist: Secondary | ICD-10-CM

## 2023-07-12 DIAGNOSIS — M65322 Trigger finger, left index finger: Secondary | ICD-10-CM

## 2023-07-12 MED ORDER — BUPIVACAINE HCL 0.25 % IJ SOLN
0.6600 mL | INTRAMUSCULAR | Status: AC | PRN
  Administered 2023-07-12: .66 mL

## 2023-07-12 MED ORDER — LIDOCAINE HCL 1 % IJ SOLN
3.0000 mL | INTRAMUSCULAR | Status: AC | PRN
  Administered 2023-07-12: 3 mL

## 2023-07-12 MED ORDER — TRIAMCINOLONE ACETONIDE 40 MG/ML IJ SUSP
13.0000 mg | INTRAMUSCULAR | Status: AC | PRN
  Administered 2023-07-12: 13 mg

## 2023-07-12 NOTE — Progress Notes (Signed)
 Office Visit Note   Patient: John Salazar           Date of Birth: 01-24-1946           MRN: [id number] Visit Date: 07/12/2023 Requested by: Felicita Horns, FNP [address] [place],  [place] [address] PCP: Felicita Horns, FNP  Subjective: Chief Complaint  Patient presents with   Left Hand - Pain    HPI: John Salazar is a 78 y.o. male who presents to the office reporting left hand pain.  Patient reports pain over the last 1 to 2 months primarily starting around the base of the left index finger.  He has had triggering that has progressively become more noticeable over the last 2 months.  Sometimes he has to use the contralateral hand in order to extend the finger.  It is worse in the morning.  Has never had prior trigger finger or surgery to the left hand.  More so recently, he has actually had worsening left wrist pain that bothers him more than the left index finger pain.  Localizes pain to the radial aspect of the wrist with radiation into the thumb.  Pain is worse with pretty much any range of motion of his thumb.  No fevers or chills.  No fall or injury.  Has not had any intervention for this problem aside from steroid Dosepak for a similar complaint when he saw Sharrell Deck, PA-C in December 2024..                ROS: All systems reviewed are negative as they relate to the chief complaint within the history of present illness.  Patient denies fevers or chills.  Assessment & Plan: Visit Diagnoses:  1. De Quervain's disease (radial styloid tenosynovitis)   2. Pain in left wrist   3. Trigger index finger of left hand     Plan: Patient is a 78 year old male who presents for evaluation of left wrist/hand pain.  Based on his eval today, seems to have a combination of left index trigger finger and de Quervain's tenosynovitis.  Dewaine Footman seems to be the primary pain generator for him currently so he would like to try injection.  Injection administered under ultrasound  guidance and we will see how this does for [place].  He does have some mild early degenerative changes of the first [facility] joint which could be contributing to his symptoms.  If this injection fails to provide any relief, we could consider [facility] injection.  Follow-up with the office as needed if symptoms do not improve.   Follow-Up Instructions: No follow-ups on file.   Orders:  Orders Placed This Encounter  Procedures   US  Guided Needle Placement - No Linked Charges   No orders of the defined types were placed in this encounter.     Procedures: Hand/UE Inj: L extensor compartment 1 for de Quervain's tenosynovitis on 07/12/2023 8:57 AM Indications: therapeutic Details: 25 G needle, ultrasound-guided radial approach Medications: 3 mL lidocaine  1 %; 0.66 mL bupivacaine 0.25 %; 13 mg triamcinolone  acetonide 40 MG/ML Outcome: tolerated well, no immediate complications Procedure, treatment alternatives, risks and benefits explained, specific risks discussed. Consent was given by the patient. Immediately prior to procedure a time out was called to verify the correct patient, procedure, equipment, support staff and site/side marked as required. Patient was prepped and draped in the usual sterile fashion.       Clinical Data: No additional findings.  Objective: Vital Signs: There  were no vitals taken for this visit.  Physical Exam:  Constitutional: Patient appears well-developed HEENT:  Head: Normocephalic Eyes:EOM are normal Neck: Normal range of motion Cardiovascular: Normal rate Pulmonary/chest: Effort normal Neurologic: Patient is alert Skin: Skin is warm Psychiatric: Patient has normal mood and affect  Ortho Exam: Ortho exam demonstrates 2+ radial pulse of the left upper extremity.  Intact EPL, FPL, finger abduction.  Able to make full composite fist.  He does have observable triggering of the left index finger with tenderness over the A1 pulley of the finger.  No cellulitis or skin  changes noted.  Has some mild tenderness over the 3-4 portal but no tenderness over the 4-5 portal.  There is some minimal tenderness over the scaphoid tubercle.  No tenderness over the ulnar fovea.  Positive grind test of the left thumb.  Positive Finkelstein's test reproducing pain in the region of the first dorsal compartment.  Maximal area of tenderness is overlying the first dorsal compartment around the radial styloid.  Specialty Comments:  No specialty comments available.  Imaging: No results found.   PMFS History: Patient Active Problem List   Diagnosis Date Noted   Acute cough 06/27/2023   Bronchitis 06/27/2023   Sudden idiopathic hearing loss 06/27/2023   Short-term memory loss 03/15/2023   Gastroesophageal reflux disease with esophagitis without hemorrhage 03/15/2023   Gastropathy 03/15/2023   Trigger index finger of left hand 03/15/2023   OSA (obstructive sleep apnea) 11/14/2022   Depression, major, single episode, moderate (HCC) 06/10/2022   Hx of long term use of blood thinners 06/10/2022   Vitamin B12 deficiency 05/23/2022   Coronary artery disease involving native coronary artery of native heart with angina pectoris (HCC) 08/22/2021   Carotid artery disease (HCC) 08/22/2021   Ascending aorta dilation (HCC) 08/22/2021   Incomplete RBBB 08/05/2021   Chronic diarrhea 11/23/2020   Internal carotid artery stenosis, right 06/29/2020   Brain aneurysm 05/18/2020   Snoring 02/27/2019   Accelerating angina (HCC) 11/15/2018   DNR (do not resuscitate) 08/23/2018   Hx of cataract surgery 08/23/2018   History of right common carotid artery stent placement 06/18/2018   Prediabetes 03/19/2018   Pain in right knee 03/05/2018   CKD (chronic kidney disease) stage 3, GFR 30-59 ml/min (HCC) 02/05/2018   Barrett esophagus 01/08/2018   Benign essential HTN 01/08/2018   BPH (benign prostatic hyperplasia) 01/08/2018   Hyperlipidemia LDL goal <70 01/08/2018   Anemia 01/08/2018    Imbalance 06/26/2017   Past Medical History:  Diagnosis Date   Anxiety    Ascending aorta dilation (HCC) 08/22/2021   Echocardiogram 04/2021: 40 mm   Barrett esophagus    BPH (benign prostatic hyperplasia)    Cerebral aneurysm    Chronic kidney disease    stage 2   Colon polyps    Coronary artery disease    mild-mod non-obstructive CAD   Erectile dysfunction    GERD (gastroesophageal reflux disease)    History of kidney stones    History of stomach ulcers    Hypercholesteremia    Hypertension    Positive TB test    in the past    Family History  Problem Relation Age of Onset   Stroke Mother 10   Aneurysm Father 2    Past Surgical History:  Procedure Laterality Date   APPENDECTOMY  1970   CATARACT EXTRACTION W/ INTRAOCULAR LENS  IMPLANT, BILATERAL     COLONOSCOPY  10/2012   ESOPHAGOGASTRODUODENOSCOPY ENDOSCOPY  10/2012   HERNIA REPAIR  IR 3D INDEPENDENT WKST  04/14/2020   IR ANGIO INTRA EXTRACRAN SEL COM CAROTID INNOMINATE BILAT MOD SED  08/09/2017   IR ANGIO INTRA EXTRACRAN SEL COM CAROTID INNOMINATE BILAT MOD SED  11/08/2018   IR ANGIO INTRA EXTRACRAN SEL COM CAROTID INNOMINATE BILAT MOD SED  04/14/2020   IR ANGIO INTRA EXTRACRAN SEL COM CAROTID INNOMINATE UNI L MOD SED  06/29/2020   IR ANGIO INTRA EXTRACRAN SEL INTERNAL CAROTID UNI L MOD SED  05/20/2020   IR ANGIO VERTEBRAL SEL VERTEBRAL BILAT MOD SED  08/09/2017   IR ANGIO VERTEBRAL SEL VERTEBRAL BILAT MOD SED  11/08/2018   IR ANGIO VERTEBRAL SEL VERTEBRAL BILAT MOD SED  04/14/2020   IR ANGIOGRAM FOLLOW UP STUDY  05/20/2020   IR INTRAVSC STENT CERV CAROTID W/EMB-PROT MOD SED INCL ANGIO  08/23/2017   IR INTRAVSC STENT CERV CAROTID W/EMB-PROT MOD SED INCL ANGIO  06/29/2020   IR RADIOLOGIST EVAL & MGMT  05/05/2020   IR RADIOLOGIST EVAL & MGMT  06/03/2020   IR RADIOLOGIST EVAL & MGMT  07/15/2020   IR TRANSCATH/EMBOLIZ  05/18/2020   IR US  GUIDE VASC ACCESS RIGHT  04/14/2020   IR US  GUIDE VASC ACCESS RIGHT  05/20/2020   IR US  GUIDE VASC  ACCESS RIGHT  06/29/2020   LEFT HEART CATH AND CORONARY ANGIOGRAPHY N/A 11/15/2018   Procedure: LEFT HEART CATH AND CORONARY ANGIOGRAPHY;  Surgeon: Sammy Crisp, MD;  Location: [facility];  Service: Cardiovascular;  Laterality: N/A;   PROSTATE SURGERY  1999   20 years ago, enlarged prostate   RADIOLOGY WITH ANESTHESIA N/A 08/23/2017   Procedure: IR WITH ANESTHESIA STENT PLACEMENT;  Surgeon: Luellen Sages, MD;  Location: [facility] OR;  Service: Radiology;  Laterality: N/A;   RADIOLOGY WITH ANESTHESIA N/A 05/18/2020   Procedure: IR WITH ANESTHESIA EMBOLIZATION;  Surgeon: Luellen Sages, MD;  Location: [facility] OR;  Service: Radiology;  Laterality: N/A;   RADIOLOGY WITH ANESTHESIA Left 06/29/2020   Procedure: RADIOLOGY WITH ANESTHESIA  LEFT CAROTID STENT PLACEMENT;  Surgeon: Luellen Sages, MD;  Location: [facility] OR;  Service: Radiology;  Laterality: Left;   Social History   Occupational History   Occupation: Comptroller.  Tobacco Use   Smoking status: Former    Current packs/day: 0.00    Average packs/day: 2.0 packs/day for 25.0 years (50.0 ttl pk-yrs)    Types: Cigarettes    Start date: 03/11/1971    Quit date: 03/10/1996    Years since quitting: 27.3   Smokeless tobacco: Never  Vaping Use   Vaping status: Never Used  Substance and Sexual Activity   Alcohol use: No    Alcohol/week: 0.0 standard drinks of alcohol    Comment: in the past drank, quit 1993   Drug use: No   Sexual activity: Not Currently

## 2023-07-22 DIAGNOSIS — G4733 Obstructive sleep apnea (adult) (pediatric): Secondary | ICD-10-CM | POA: Diagnosis not present

## 2023-07-26 ENCOUNTER — Encounter: Payer: Self-pay | Admitting: Internal Medicine

## 2023-07-27 ENCOUNTER — Ambulatory Visit: Admitting: Internal Medicine

## 2023-08-03 NOTE — Progress Notes (Signed)
 HPI M former smoker (50pkyrs) followed for OSA, complicated by  HTN CAD, Brain Aneurysm, Carotid Stenosis, GERD/ Barrett's Esophagus, BPH, Hyperlipidemia, Anemia, Depression, CKD3,  HST 08/21/22-AHI 39.7/hr, desaturation to 84%, body weight 170 lbs  ===============================================================================================================  11/14/22- 78 yoM former smoker (50pkyrs) followed for OSA, complicated by  HTN CAD, Brain Aneurysm, Carotid Stenosis, GERD/ Barrett's Esophagus, BPH, Hyperlipidemia, Anemia, Depression, CKD3,  HST 08/21/22-AHI 39.7/hr, desaturation to 84%, body weight 170 lbs Body weight today- For treatment decision Had flu vax Discussed options. Will start with CPAP auto 5-20. Note frequent throat clearing. He has hx GERD and postnasal drip.  08/04/23-78 yoM former smoker (50pkyrs) followed for OSA, complicated by  HTN CAD, Brain Aneurysm, Carotid Stenosis, GERD/ Barrett's Esophagus, BPH, Hyperlipidemia, Anemia, Depression, CKD3,  CPAP auto 5-20/ [employer]  ordered 06/05/23 Download compliance- 27%, AHI 8.1/hr Body weight today 174 lbs -----Doing well.  Sleep in good.  No problems with CPAP. Download reviewed with him Discussed the use of AI scribe software for clinical note transcription with the patient, who gave verbal consent to proceed.  History of Present Illness   John Salazar is a 78 year old male with sleep apnea who presents with issues related to CPAP mask usage.  He experiences difficulties with the nasal pillows type of CPAP mask. Initially, he managed to sleep with it for about five hours but found it too tight, causing swelling. Adjustments to the tightness have had mixed results. On some nights, the mask becomes loose, leading to air leakage. He is a restless sleeper, moving frequently during the night, which may contribute to the mask issues.  He denies any violent movements such as kicking or punching during sleep but acknowledges  frequent movements. He sometimes experiences leg discomfort at night, which he manages with a heating pad.     Assessment and Plan:    Obstructive Sleep Apnea Difficulty with CPAP due to mask fit and restless sleep. Discussed Inspire therapy as an alternative, but cost and insurance are concerns. - Adjust CPAP mask fit for comfort and efficacy. - Refer to ENT surgeon for [facility] therapy consultation.  Restless Legs Syndrome Managed with heating pad providing relief.  Gastroesophageal Reflux Disease (GERD) Chronic GERD with symptoms exacerbated by late eating and smoking. Hiatal hernia and Barrett's esophagus present. - Avoid late meals. - Use nasal spray for sinus-related symptoms.  Hiatal Hernia Identified during previous upper GI examination.  Barrett's Esophagus Contributing to GERD symptoms.     ROS-see HPI   + = positive Constitutional:    weight loss, night sweats, fevers, chills, fatigue, lassitude. HEENT:    headaches, difficulty swallowing, tooth/dental problems, sore throat,       sneezing, itching, ear ache, nasal congestion, post nasal drip, snoring CV:    chest pain, orthopnea, PND, swelling in lower extremities, anasarca,                                   dizziness, palpitations Resp:   +shortness of breath with exertion or at rest.                productive cough,   non-productive cough, coughing up of blood.              change in color of mucus.  wheezing.   Skin:    rash or lesions. GI:  No-   heartburn, indigestion, abdominal pain, nausea, vomiting, diarrhea,  change in bowel habits, loss of appetite GU: dysuria, change in color of urine, no urgency or frequency.   flank pain. MS:   joint pain, +stiffness, decreased range of motion, back pain. Neuro-     nothing unusual Psych:  change in mood or affect.  depression or anxiety.   memory loss.  OBJ- Physical Exam General- Alert, Oriented, Affect-appropriate, Distress- none acute, + lean Skin-  rash-none, lesions- none, excoriation- none Lymphadenopathy- none Head- atraumatic            Eyes- Gross vision intact, PERRLA, conjunctivae and secretions clear            Ears- Hearing, canals-normal            Nose- Clear, no-Septal dev, mucus, polyps, erosion, perforation             Throat- Mallampati IV, mucosa clear , +frequent throat clearing, tonsils+absent, +dental repair/ partial, Neck- flexible , trachea midline, no stridor , thyroid  nl, carotid no bruit Chest - symmetrical excursion , unlabored           Heart/CV- RRR , no murmur , no gallop  , no rub, nl s1 s2                           - JVD- none , edema- none, stasis changes- none, varices- none           Lung- +clear to P&A, wheeze- none, cough- none , dullness-none, rub- none           Chest wall-  Abd-  Br/ Gen/ Rectal- Not done, not indicated Extrem- cyanosis- none, clubbing, none, atrophy- none, strength- nl Neuro- grossly intact to observation

## 2023-08-04 ENCOUNTER — Ambulatory Visit: Admitting: Internal Medicine

## 2023-08-04 ENCOUNTER — Encounter: Payer: Self-pay | Admitting: Internal Medicine

## 2023-08-04 VITALS — BP 126/72 | HR 72 | Temp 98.1°F | Ht 69.0 in | Wt 174.2 lb

## 2023-08-04 DIAGNOSIS — K219 Gastro-esophageal reflux disease without esophagitis: Secondary | ICD-10-CM | POA: Diagnosis not present

## 2023-08-04 DIAGNOSIS — G2581 Restless legs syndrome: Secondary | ICD-10-CM | POA: Diagnosis not present

## 2023-08-04 DIAGNOSIS — K449 Diaphragmatic hernia without obstruction or gangrene: Secondary | ICD-10-CM | POA: Diagnosis not present

## 2023-08-04 DIAGNOSIS — G4733 Obstructive sleep apnea (adult) (pediatric): Secondary | ICD-10-CM | POA: Diagnosis not present

## 2023-08-04 NOTE — Patient Instructions (Signed)
 Our goal with CPAP is to use it at least 4 hours per night on at least 6 nights per week.  Order- refer to [facility] ENT, Dr Okey- consider Inspire as alternative to CPAP for OSA

## 2023-08-05 ENCOUNTER — Encounter (HOSPITAL_COMMUNITY): Payer: Self-pay | Admitting: Interventional Radiology

## 2023-08-07 ENCOUNTER — Telehealth (HOSPITAL_COMMUNITY): Payer: Self-pay

## 2023-08-07 ENCOUNTER — Encounter (INDEPENDENT_AMBULATORY_CARE_PROVIDER_SITE_OTHER): Payer: Self-pay

## 2023-08-07 NOTE — Telephone Encounter (Signed)
 Returned pt's call, no answer, left vm. AB

## 2023-08-09 ENCOUNTER — Other Ambulatory Visit

## 2023-08-14 ENCOUNTER — Encounter (HOSPITAL_COMMUNITY): Payer: Self-pay

## 2023-08-14 ENCOUNTER — Other Ambulatory Visit: Payer: Self-pay

## 2023-08-14 ENCOUNTER — Observation Stay (HOSPITAL_COMMUNITY)
Admission: EM | Admit: 2023-08-14 | Discharge: 2023-08-15 | Disposition: A | Discharge status: Home / Self Care | Attending: Internal Medicine | Admitting: Internal Medicine

## 2023-08-14 ENCOUNTER — Observation Stay (HOSPITAL_COMMUNITY)

## 2023-08-14 ENCOUNTER — Emergency Department (HOSPITAL_COMMUNITY)

## 2023-08-14 DIAGNOSIS — G459 Transient cerebral ischemic attack, unspecified: Secondary | ICD-10-CM | POA: Diagnosis not present

## 2023-08-14 DIAGNOSIS — D649 Anemia, unspecified: Secondary | ICD-10-CM | POA: Diagnosis not present

## 2023-08-14 DIAGNOSIS — I6529 Occlusion and stenosis of unspecified carotid artery: Secondary | ICD-10-CM | POA: Diagnosis not present

## 2023-08-14 DIAGNOSIS — N400 Enlarged prostate without lower urinary tract symptoms: Secondary | ICD-10-CM | POA: Insufficient documentation

## 2023-08-14 DIAGNOSIS — R42 Dizziness and giddiness: Secondary | ICD-10-CM

## 2023-08-14 DIAGNOSIS — G4733 Obstructive sleep apnea (adult) (pediatric): Secondary | ICD-10-CM | POA: Diagnosis present

## 2023-08-14 DIAGNOSIS — I129 Hypertensive chronic kidney disease with stage 1 through stage 4 chronic kidney disease, or unspecified chronic kidney disease: Secondary | ICD-10-CM | POA: Diagnosis not present

## 2023-08-14 DIAGNOSIS — R55 Syncope and collapse: Secondary | ICD-10-CM | POA: Diagnosis not present

## 2023-08-14 DIAGNOSIS — N179 Acute kidney failure, unspecified: Secondary | ICD-10-CM | POA: Diagnosis not present

## 2023-08-14 DIAGNOSIS — I6523 Occlusion and stenosis of bilateral carotid arteries: Secondary | ICD-10-CM

## 2023-08-14 DIAGNOSIS — N1831 Chronic kidney disease, stage 3a: Secondary | ICD-10-CM | POA: Diagnosis not present

## 2023-08-14 DIAGNOSIS — K2100 Gastro-esophageal reflux disease with esophagitis, without bleeding: Secondary | ICD-10-CM | POA: Diagnosis not present

## 2023-08-14 DIAGNOSIS — E785 Hyperlipidemia, unspecified: Secondary | ICD-10-CM | POA: Diagnosis not present

## 2023-08-14 DIAGNOSIS — R410 Disorientation, unspecified: Secondary | ICD-10-CM | POA: Diagnosis present

## 2023-08-14 DIAGNOSIS — I779 Disorder of arteries and arterioles, unspecified: Secondary | ICD-10-CM | POA: Diagnosis present

## 2023-08-14 DIAGNOSIS — F321 Major depressive disorder, single episode, moderate: Secondary | ICD-10-CM | POA: Insufficient documentation

## 2023-08-14 DIAGNOSIS — Z79899 Other long term (current) drug therapy: Secondary | ICD-10-CM | POA: Insufficient documentation

## 2023-08-14 DIAGNOSIS — I10 Essential (primary) hypertension: Secondary | ICD-10-CM | POA: Diagnosis present

## 2023-08-14 DIAGNOSIS — I6782 Cerebral ischemia: Secondary | ICD-10-CM | POA: Diagnosis not present

## 2023-08-14 DIAGNOSIS — N183 Chronic kidney disease, stage 3 unspecified: Secondary | ICD-10-CM | POA: Diagnosis present

## 2023-08-14 DIAGNOSIS — R29818 Other symptoms and signs involving the nervous system: Secondary | ICD-10-CM | POA: Diagnosis not present

## 2023-08-14 DIAGNOSIS — I25118 Atherosclerotic heart disease of native coronary artery with other forms of angina pectoris: Secondary | ICD-10-CM | POA: Diagnosis not present

## 2023-08-14 DIAGNOSIS — I671 Cerebral aneurysm, nonruptured: Secondary | ICD-10-CM | POA: Diagnosis present

## 2023-08-14 DIAGNOSIS — N1832 Chronic kidney disease, stage 3b: Secondary | ICD-10-CM | POA: Insufficient documentation

## 2023-08-14 DIAGNOSIS — I672 Cerebral atherosclerosis: Secondary | ICD-10-CM | POA: Diagnosis not present

## 2023-08-14 DIAGNOSIS — I25119 Atherosclerotic heart disease of native coronary artery with unspecified angina pectoris: Secondary | ICD-10-CM | POA: Diagnosis not present

## 2023-08-14 DIAGNOSIS — Z95828 Presence of other vascular implants and grafts: Secondary | ICD-10-CM | POA: Diagnosis not present

## 2023-08-14 DIAGNOSIS — Z9889 Other specified postprocedural states: Secondary | ICD-10-CM

## 2023-08-14 LAB — I-STAT CHEM 8, ED
BUN: 25 mg/dL — ABNORMAL HIGH (ref 8–23)
Calcium, Ion: 1.19 mmol/L (ref 1.15–1.40)
Chloride: 105 mmol/L (ref 98–111)
Creatinine, Ser: 2.2 mg/dL — ABNORMAL HIGH (ref 0.61–1.24)
Glucose, Bld: 156 mg/dL — ABNORMAL HIGH (ref 70–99)
HCT: 38 % — ABNORMAL LOW (ref 39.0–52.0)
Hemoglobin: 12.9 g/dL — ABNORMAL LOW (ref 13.0–17.0)
Potassium: 3.6 mmol/L (ref 3.5–5.1)
Sodium: 141 mmol/L (ref 135–145)
TCO2: 25 mmol/L (ref 22–32)

## 2023-08-14 LAB — COMPREHENSIVE METABOLIC PANEL WITH GFR
ALT: 20 U/L (ref 0–44)
AST: 19 U/L (ref 15–41)
Albumin: 3.9 g/dL (ref 3.5–5.0)
Alkaline Phosphatase: 69 U/L (ref 38–126)
Anion gap: 11 (ref 5–15)
BUN: 23 mg/dL (ref 8–23)
CO2: 25 mmol/L (ref 22–32)
Calcium: 9.2 mg/dL (ref 8.9–10.3)
Chloride: 107 mmol/L (ref 98–111)
Creatinine, Ser: 2.12 mg/dL — ABNORMAL HIGH (ref 0.61–1.24)
GFR, Estimated: 31 mL/min — ABNORMAL LOW (ref >60)
Glucose, Bld: 163 mg/dL — ABNORMAL HIGH (ref 70–99)
Potassium: 3.6 mmol/L (ref 3.5–5.1)
Sodium: 143 mmol/L (ref 135–145)
Total Bilirubin: 0.7 mg/dL (ref 0.0–1.2)
Total Protein: 6.7 g/dL (ref 6.5–8.1)

## 2023-08-14 LAB — DIFFERENTIAL
Abs Immature Granulocytes: 0.02 K/uL (ref 0.00–0.07)
Basophils Absolute: 0 K/uL (ref 0.0–0.1)
Basophils Relative: 1 %
Eosinophils Absolute: 0.1 K/uL (ref 0.0–0.5)
Eosinophils Relative: 2 %
Immature Granulocytes: 0 %
Lymphocytes Relative: 23 %
Lymphs Abs: 1.1 K/uL (ref 0.7–4.0)
Monocytes Absolute: 0.4 K/uL (ref 0.1–1.0)
Monocytes Relative: 8 %
Neutro Abs: 3.2 K/uL (ref 1.7–7.7)
Neutrophils Relative %: 66 %

## 2023-08-14 LAB — CBC
HCT: 38.4 % — ABNORMAL LOW (ref 39.0–52.0)
Hemoglobin: 12.7 g/dL — ABNORMAL LOW (ref 13.0–17.0)
MCH: 30.6 pg (ref 26.0–34.0)
MCHC: 33.1 g/dL (ref 30.0–36.0)
MCV: 92.5 fL (ref 80.0–100.0)
Platelets: 154 K/uL (ref 150–400)
RBC: 4.15 MIL/uL — ABNORMAL LOW (ref 4.22–5.81)
RDW: 13.6 % (ref 11.5–15.5)
WBC: 4.9 K/uL (ref 4.0–10.5)
nRBC: 0 % (ref 0.0–0.2)

## 2023-08-14 LAB — RAPID URINE DRUG SCREEN, HOSP PERFORMED
Amphetamines: NOT DETECTED
Barbiturates: NOT DETECTED
Benzodiazepines: NOT DETECTED
Cocaine: NOT DETECTED
Opiates: NOT DETECTED
Tetrahydrocannabinol: NOT DETECTED

## 2023-08-14 LAB — PROTIME-INR
INR: 1 (ref 0.8–1.2)
Prothrombin Time: 14.3 s (ref 11.4–15.2)

## 2023-08-14 LAB — CBG MONITORING, ED: Glucose-Capillary: 164 mg/dL — ABNORMAL HIGH (ref 70–99)

## 2023-08-14 LAB — APTT: aPTT: 29 s (ref 24–36)

## 2023-08-14 MED ORDER — ESCITALOPRAM OXALATE 10 MG PO TABS
20.0000 mg | ORAL_TABLET | Freq: Every day | ORAL | Status: DC
  Administered 2023-08-15: 20 mg via ORAL
  Filled 2023-08-14: qty 2

## 2023-08-14 MED ORDER — TAMSULOSIN HCL 0.4 MG PO CAPS
0.4000 mg | ORAL_CAPSULE | Freq: Two times a day (BID) | ORAL | Status: DC
  Administered 2023-08-15: 0.4 mg via ORAL
  Filled 2023-08-14: qty 1

## 2023-08-14 MED ORDER — CLOPIDOGREL BISULFATE 75 MG PO TABS
75.0000 mg | ORAL_TABLET | Freq: Every day | ORAL | Status: DC
  Administered 2023-08-15: 75 mg via ORAL
  Filled 2023-08-14: qty 1

## 2023-08-14 MED ORDER — ACETAMINOPHEN 650 MG RE SUPP: 650.0000 mg | RECTAL | Status: DC | PRN

## 2023-08-14 MED ORDER — SENNOSIDES-DOCUSATE SODIUM 8.6-50 MG PO TABS: 1.0000 | ORAL_TABLET | Freq: Every evening | ORAL | Status: DC | PRN

## 2023-08-14 MED ORDER — ROSUVASTATIN CALCIUM 20 MG PO TABS
40.0000 mg | ORAL_TABLET | Freq: Every day | ORAL | Status: DC
  Administered 2023-08-15: 40 mg via ORAL
  Filled 2023-08-14: qty 2

## 2023-08-14 MED ORDER — ACETAMINOPHEN 160 MG/5ML PO SOLN: 650.0000 mg | ORAL | Status: DC | PRN

## 2023-08-14 MED ORDER — ACETAMINOPHEN 325 MG PO TABS: 650.0000 mg | ORAL_TABLET | ORAL | Status: DC | PRN

## 2023-08-14 MED ORDER — STROKE: EARLY STAGES OF RECOVERY BOOK
Freq: Once | Status: AC
  Administered 2023-08-15: 1
  Filled 2023-08-14: qty 1

## 2023-08-14 MED ORDER — SODIUM CHLORIDE 0.9 % IV SOLN: INTRAVENOUS | Status: DC

## 2023-08-14 MED ORDER — FAMOTIDINE 20 MG PO TABS: 20.0000 mg | ORAL_TABLET | Freq: Every day | ORAL | Status: DC | PRN

## 2023-08-14 MED ORDER — IOHEXOL 350 MG/ML SOLN
75.0000 mL | Freq: Once | INTRAVENOUS | Status: AC | PRN
  Administered 2023-08-14: 75 mL via INTRAVENOUS

## 2023-08-14 MED ORDER — SODIUM CHLORIDE 0.9 % IV BOLUS
500.0000 mL | Freq: Once | INTRAVENOUS | Status: AC
  Administered 2023-08-14: 500 mL via INTRAVENOUS

## 2023-08-14 NOTE — H&P (Addendum)
 History and Physical   John Salazar FMW:[id number] DOB: [birth year] DOA: 08/14/2023  PCP: Wendee Lynwood HERO, NP   Patient coming from: Home  Chief Complaint: Dizziness, confusion  HPI: John Salazar is a 78 y.o. male with medical history significant of hypertension, hyperlipidemia, GERD, carotid artery disease status post stenting, CAD, CKD 3, BPH, OSA, depression, brain aneurysm, anemia presenting with intermittent dizziness, confusion.  Patient reports about a week of intermittent balance issues and vision changes especially when he is up and active.  Today he was playing golf and had more severe episode of felling off balance and more blurry vision; where he also had some confusion.Felt similar to when he had issues with his carotid artery disease.  Does report some history of intermittent diarrhea Denies fevers, chills, chest pain, shortness breath, abdominal pain, constipation, nausea, vomiting.  Denies any other focal neurologic deficits.  ED Course: Vital signs in the ED notable for blood pressure in the 100s-120 systolic.  Lab workup included BMP which showed creatinine elevated 2.12 from baseline 1.6, glucose 163.  CBC showed hemoglobin stable at 12.7.  PT, PTT, INR within normal limits.  UDS pending.  Ethanol level pending.  CTA of the head and neck showed bilateral internal carotid stents that are patent.  No LVO or high-grade stenosis.  No acute abnormality otherwise.  Patient received 500 cc IV fluid in the ED.  Review of Systems: As per HPI otherwise all other systems reviewed and are negative.  Past Medical History:  Diagnosis Date   Anxiety    Ascending aorta dilation (HCC) 08/22/2021   Echocardiogram 04/2021: 40 mm   Barrett esophagus    BPH (benign prostatic hyperplasia)    Cerebral aneurysm    Chronic kidney disease    stage 2   Colon polyps    Coronary artery disease    mild-mod non-obstructive CAD   Erectile dysfunction    GERD (gastroesophageal reflux disease)     History of kidney stones    History of stomach ulcers    Hypercholesteremia    Hypertension    Internal carotid artery stenosis, right 06/29/2020   Positive TB test    in the past    Past Surgical History:  Procedure Laterality Date   APPENDECTOMY  1970   CATARACT EXTRACTION W/ INTRAOCULAR LENS  IMPLANT, BILATERAL     COLONOSCOPY  10/2012   ESOPHAGOGASTRODUODENOSCOPY ENDOSCOPY  10/2012   HERNIA REPAIR     IR 3D INDEPENDENT WKST  04/14/2020   IR ANGIO INTRA EXTRACRAN SEL COM CAROTID INNOMINATE BILAT MOD SED  08/09/2017   IR ANGIO INTRA EXTRACRAN SEL COM CAROTID INNOMINATE BILAT MOD SED  11/08/2018   IR ANGIO INTRA EXTRACRAN SEL COM CAROTID INNOMINATE BILAT MOD SED  04/14/2020   IR ANGIO INTRA EXTRACRAN SEL COM CAROTID INNOMINATE UNI L MOD SED  06/29/2020   IR ANGIO INTRA EXTRACRAN SEL INTERNAL CAROTID UNI L MOD SED  05/20/2020   IR ANGIO VERTEBRAL SEL VERTEBRAL BILAT MOD SED  08/09/2017   IR ANGIO VERTEBRAL SEL VERTEBRAL BILAT MOD SED  11/08/2018   IR ANGIO VERTEBRAL SEL VERTEBRAL BILAT MOD SED  04/14/2020   IR ANGIOGRAM FOLLOW UP STUDY  05/20/2020   IR INTRAVSC STENT CERV CAROTID W/EMB-PROT MOD SED INCL ANGIO  08/23/2017   IR INTRAVSC STENT CERV CAROTID W/EMB-PROT MOD SED INCL ANGIO  06/29/2020   IR RADIOLOGIST EVAL & MGMT  05/05/2020   IR RADIOLOGIST EVAL & MGMT  06/03/2020   IR RADIOLOGIST EVAL &  MGMT  07/15/2020   IR TRANSCATH/EMBOLIZ  05/18/2020   IR US  GUIDE VASC ACCESS RIGHT  04/14/2020   IR US  GUIDE VASC ACCESS RIGHT  05/20/2020   IR US  GUIDE VASC ACCESS RIGHT  06/29/2020   LEFT HEART CATH AND CORONARY ANGIOGRAPHY N/A 11/15/2018   Procedure: LEFT HEART CATH AND CORONARY ANGIOGRAPHY;  Surgeon: Mady Bruckner, MD;  Location: [facility] INVASIVE CV [facility];  Service: Cardiovascular;  Laterality: N/A;   PROSTATE SURGERY  1999   20 years ago, enlarged prostate   RADIOLOGY WITH ANESTHESIA N/A 08/23/2017   Procedure: IR WITH ANESTHESIA STENT PLACEMENT;  Surgeon: Dolphus Carrion, MD;  Location: [facility] OR;  Service:  Radiology;  Laterality: N/A;   RADIOLOGY WITH ANESTHESIA N/A 05/18/2020   Procedure: IR WITH ANESTHESIA EMBOLIZATION;  Surgeon: Dolphus Carrion, MD;  Location: [facility] OR;  Service: Radiology;  Laterality: N/A;   RADIOLOGY WITH ANESTHESIA Left 06/29/2020   Procedure: RADIOLOGY WITH ANESTHESIA  LEFT CAROTID STENT PLACEMENT;  Surgeon: Dolphus Carrion, MD;  Location: [facility] OR;  Service: Radiology;  Laterality: Left;    Social History  reports that he quit smoking about 27 years ago. His smoking use included cigarettes. He started smoking about 52 years ago. He has a 50 pack-year smoking history. He has never used smokeless tobacco. He reports that he does not drink alcohol and does not use drugs.  Allergies  Allergen Reactions   Bee Venom Anaphylaxis and Shortness Of Breath   Penicillins Rash    PATIENT HAS HAD A PCN REACTION WITH IMMEDIATE RASH, FACIAL/TONGUE/THROAT SWELLING, SOB, OR LIGHTHEADEDNESS WITH HYPOTENSION:  #  #  YES  #  #   Has patient had a PCN reaction causing severe rash involving mucus membranes or skin necrosis: no  Has patient had a PCN reaction that required hospitalization: no  Has patient had a PCN reaction occurring within the last 10 years: no  Other Reaction(s): Not available    Family History  Problem Relation Age of Onset   Stroke Mother 74   Aneurysm Father 6  Reviewed on admission  Prior to Admission medications   Medication Sig Start Date End Date Taking? Authorizing Provider  amLODipine  (NORVASC ) 2.5 MG tablet Take 1 tablet (2.5 mg total) by mouth daily. Patient taking differently: Take 2.5 mg by mouth 2 (two) times daily. 12/09/22   Okey Vina GAILS, MD  clopidogrel  (PLAVIX ) 75 MG tablet TAKE ONE TABLET BY MOUTH DAILY 06/21/23   Okey Vina GAILS, MD  escitalopram  (LEXAPRO ) 20 MG tablet Take 1 tablet (20 mg total) by mouth daily. 03/23/23   Dugal, Tabitha, FNP  famotidine  (PEPCID ) 20 MG tablet Take 20 mg by mouth daily as needed for heartburn or indigestion.     [provider]  fluticasone  (FLONASE ) 50 MCG/ACT nasal spray Place 2 sprays into both nostrils daily. 06/27/23   Wendee Lynwood HERO, NP  isosorbide  mononitrate (IMDUR ) 30 MG 24 hr tablet Take 0.5 tablets (15 mg total) by mouth daily. AS NEEDED 02/14/23   Okey Vina GAILS, MD  losartan  (COZAAR ) 50 MG tablet Take 0.5 tablets (25 mg total) by mouth daily. 02/03/23   Vannie Reche RAMAN, NP  nitroGLYCERIN  (NITROSTAT ) 0.4 MG SL tablet Place 1 tablet (0.4 mg total) under the tongue every 5 (five) minutes as needed for chest pain. 02/14/23   Okey Vina GAILS, MD  rosuvastatin  (CRESTOR ) 40 MG tablet Take one (1) tablet by mouth (40 mg) daily. 12/22/22   Okey Vina GAILS, MD  tamsulosin  (FLOMAX ) 0.4 MG CAPS capsule  Take one capsule once daily ( thirty minutes after same meal daily) 11/28/22   Okey Vina GAILS, MD    Physical Exam: Vitals:   08/14/23 1334 08/14/23 1353 08/14/23 1555  BP: (!) 107/57  129/75  Pulse: 76  61  Resp: 16  16  Temp: 98 F (36.7 C)  97.7 F (36.5 C)  SpO2: 96%  100%  Weight:  77.1 kg   Height:  5' 9 (1.753 m)     Physical Exam Constitutional:      General: He is not in acute distress.    Appearance: Normal appearance.  HENT:     Head: Normocephalic and atraumatic.     Mouth/Throat:     Mouth: Mucous membranes are moist.     Pharynx: Oropharynx is clear.  Eyes:     Extraocular Movements: Extraocular movements intact.     Pupils: Pupils are equal, round, and reactive to light.  Cardiovascular:     Rate and Rhythm: Normal rate and regular rhythm.     Pulses: Normal pulses.     Heart sounds: Normal heart sounds.  Pulmonary:     Effort: Pulmonary effort is normal. No respiratory distress.     Breath sounds: Normal breath sounds.  Abdominal:     General: Bowel sounds are normal. There is no distension.     Palpations: Abdomen is soft.     Tenderness: There is no abdominal tenderness.  Musculoskeletal:        General: No swelling or deformity.  Skin:    General: Skin is  warm and dry.  Neurological:     Comments: Mental Status: Patient is awake, alert, oriented x3 No signs of aphasia or neglect Cranial Nerves: II: Pupils equal, round, and reactive to light.   III,IV, VI: EOMI without ptosis or diploplia.  V: Facial sensation is symmetric to light touch. VII: Facial movement is symmetric.  VIII: hearing is intact to voice X: Uvula elevates symmetrically XI: Shoulder shrug is symmetric. XII: tongue is midline without atrophy or fasciculations.  Motor: Good effort thorughout, at Least 5/5 bilateral UE, 5/5 bilateral lower extremitiy  Sensory: Sensation is grossly intact bilateral UEs & LEs Cerebellar: Finger-Nose intact bilat    Labs on Admission: I have personally reviewed following labs and imaging studies  CBC: Recent Labs  Lab 08/14/23 1346 08/14/23 1358  WBC 4.9  --   NEUTROABS 3.2  --   HGB 12.7* 12.9*  HCT 38.4* 38.0*  MCV 92.5  --   PLT 154  --     Basic Metabolic Panel: Recent Labs  Lab 08/14/23 1346 08/14/23 1358  NA 143 141  K 3.6 3.6  CL 107 105  CO2 25  --   GLUCOSE 163* 156*  BUN 23 25*  CREATININE 2.12* 2.20*  CALCIUM  9.2  --     GFR: Estimated Creatinine Clearance: 27.7 mL/min (A) (by C-G formula based on SCr of 2.2 mg/dL (H)).  Liver Function Tests: Recent Labs  Lab 08/14/23 1346  AST 19  ALT 20  ALKPHOS 69  BILITOT 0.7  PROT 6.7  ALBUMIN 3.9    Urine analysis:    Component Value Date/Time   BILIRUBINUR Negative 10/26/2021 0835   PROTEINUR Negative 10/26/2021 0835   UROBILINOGEN 0.2 10/26/2021 0835   NITRITE Negative 10/26/2021 0835   LEUKOCYTESUR Negative 10/26/2021 0835    Radiological Exams on Admission: CT ANGIO HEAD NECK W WO CM Result Date: 08/14/2023 CLINICAL DATA:  Neuro deficit, concern for stroke, dizziness intermittently  for the past week. EXAM: CT ANGIOGRAPHY HEAD AND NECK WITH AND WITHOUT CONTRAST TECHNIQUE: Multidetector CT imaging of the head and neck was performed using the  standard protocol during bolus administration of intravenous contrast. Multiplanar CT image reconstructions and MIPs were obtained to evaluate the vascular anatomy. Carotid stenosis measurements (when applicable) are obtained utilizing NASCET criteria, using the distal internal carotid diameter as the denominator. RADIATION DOSE REDUCTION: This exam was performed according to the departmental dose-optimization program which includes automated exposure control, adjustment of the mA and/or kV according to patient size and/or use of iterative reconstruction technique. CONTRAST:  75mL OMNIPAQUE  IOHEXOL  350 MG/ML SOLN COMPARISON:  CTA head and neck 11/21/2022. FINDINGS: CT HEAD FINDINGS Brain: No acute intracranial hemorrhage. No CT evidence of acute infarct. Nonspecific hypoattenuation in the periventricular and subcortical white matter favored to reflect chronic microvascular ischemic changes. No edema, mass effect, or midline shift. The basilar cisterns are patent. Ventricles: The ventricles are normal. Vascular: Atherosclerotic calcifications of the carotid siphons and intracranial vertebral arteries. No hyperdense vessel. Stent extending from the supraclinoid left ICA to the left M1 segment. Skull: No acute or aggressive finding. Orbits: Bilateral lens replacement. Sinuses: Mucosal thickening in the right sphenoid sinus. Other: Mastoid air cells are clear. CTA NECK FINDINGS Aortic arch: 3 vessel configuration of the aortic arch. Atherosclerosis of the visualized arch involving the aortic arch vessel origins without significant stenosis. Pulmonary arteries: As permitted by contrast timing, there are no filling defects in the visualized pulmonary arteries. Subclavian arteries: The subclavian arteries are patent bilaterally. Right carotid system: Patent from the origin to the skull base. Stent extending from the carotid bifurcation into the mid cervical ICA. Stent is patent. No high-grade stenosis, aneurysm, or  dissection. Left carotid system: Patent from the origin to the skull base. Stent extending from the carotid bifurcation to the mid cervical ICA. No high-grade stenosis, aneurysm, or dissection. Vertebral arteries: Patent from the origins to the vertebrobasilar confluence. Atherosclerosis at the left V1 segment resulting in mild stenosis. Mild tortuosity of the left V1 segment. Additional atherosclerosis of the bilateral V4 segments without significant stenosis. No evidence of dissection. Skeleton: No acute findings. Degenerative changes in the cervical spine. Disc space narrowing greatest at C5-6. Other neck: The visualized airway is patent. No cervical lymphadenopathy. Upper chest: Centrilobular emphysema in the lung apices. Review of the MIP images confirms the above findings CTA HEAD FINDINGS ANTERIOR CIRCULATION: The intracranial internal carotid arteries are patent bilaterally. Atherosclerotic plaque involving the carotid siphons resulting in mild stenosis, right greater than left. Stent noted extending from the supraclinoid left ICA to the left M1 segment. MCAs: Stent in the proximal M1 segment of the left MCA is patent. Left MCA branches are patent. Mild narrowing of the right M1 segment is similar to prior. Right MCA branches are patent. ACAs: The anterior cerebral arteries are patent bilaterally. POSTERIOR CIRCULATION: No significant stenosis, proximal occlusion, aneurysm, or vascular malformation. PCAs: Patent bilaterally. Similar mild atherosclerotic irregularity of the right P2 segment. Additional mild-to-moderate atherosclerotic irregularity of the left P2 segment. Pcomm: Not well visualized. SCAs: The superior cerebellar arteries are patent bilaterally. Basilar artery: Patent AICAs: Patent PICAs: Patent Vertebral arteries: The intracranial vertebral arteries are patent. Venous sinuses: As permitted by contrast timing, patent. Anatomic variants: None Review of the MIP images confirms the above findings  IMPRESSION: Bilateral common/internal carotid artery stents in the neck and stent extending from the left supraclinoid ICA to left M1 segment are patent. No large vessel occlusion. No  high-grade stenosis, dissection, or aneurysm. No CT evidence of acute intracranial abnormality. Mild atherosclerosis as above. Aortic Atherosclerosis (ICD10-I70.0) and Emphysema (ICD10-J43.9). Electronically Signed   By: Donnice Mania M.D.   On: 08/14/2023 14:53   EKG: Independently reviewed.  Sinus rhythm at 76 bpm.  Nonspecific T wave changes.  Assessment/Plan Active Problems:   Benign essential HTN   BPH (benign prostatic hyperplasia)   Hyperlipidemia LDL goal <70   Anemia   CKD (chronic kidney disease) stage 3, GFR 30-59 ml/min (HCC)   History of right common carotid artery stent placement   Brain aneurysm   Coronary artery disease involving native coronary artery of native heart with angina pectoris (HCC)   Carotid artery disease (HCC)   Depression, major, single episode, moderate (HCC)   OSA (obstructive sleep apnea)   Gastroesophageal reflux disease with esophagitis without hemorrhage   TIA Rule out near syncope > Patient presenting with intermittent episodes of being off balance and having blurry vision.  Has been ongoing for a week.  Has severe episode today where he was more off balance and vision was even worse and had some confused while playing golf. > Had similar symptoms with his severe carotid artery disease in the past. > CTA head neck in the ED showed patent bilateral carotid artery stents and no other acute abnormalities/LVO/high-grade stenosis. > Does have AKI indicating possible dehydration as below will rule out near syncope as possible explanations though he is more off balance than lightheaded during episodes. > Will consult neurology if MRI positive. - Monitor on telemetry overnight - Allow for permissive HTN while awaiting MRI - Continue home Plavix  - Continue home statin -  Echocardiogram  - MRI brain - A1C  - Lipid panel  - Tele monitoring  - SLP eval - PT/OT - Orthostatic vital signs  AKI on CKD 3B > Creatinine elevated to 2.12 from baseline 1.6 in the ED.  Likely dehydration.  Received 500 cc in the ED. - Continue with IV fluid - Trend renal function and electrolytes  Hypertension - Permissive hypertension for now as above  Hyperlipidemia - Continue rosuvastatin   GERD - Continue PPI  Carotid disease > Status post bilateral stenting.  Patent on CTA head and neck. - Continue home Plavix , rosuvastatin   CAD > Cath in 2020 with significant distal lesions 70-80% stenosis that are not amenable to stenting.  On aggressive medical therapy. - Holding Imdur  and losartan  as above - Continue home rosuvastatin , Plavix   Depression - Continue home Lexapro   Anemia > Hemoglobin stable at 0.7 in the ED. - Trend CBC  OSA - Continue home CPAP  History of brain aneurysm - History of coiling  DVT prophylaxis: SCDs for now, while awaiting MRI Code Status:   Full Family Communication:  Updated at bedside  Disposition Plan:   Patient is from:  Home  Anticipated DC to:  Home  Anticipated DC date:  1 to 3 days  Anticipated DC barriers: None  Consults called:  None, possibly neurology pending MRI Admission status:  Observation, telemetry  Severity of Illness: The appropriate patient status for this patient is OBSERVATION. Observation status is judged to be reasonable and necessary in order to provide the required intensity of service to ensure the patient's safety. The patient's presenting symptoms, physical exam findings, and initial radiographic and laboratory data in the context of their medical condition is felt to place them at decreased risk for further clinical deterioration. Furthermore, it is anticipated that the patient will be medically stable  for discharge from the hospital within 2 midnights of admission.    Marsa KATHEE Scurry MD [facility]  How to contact the [facility] Attending or Consulting provider 7A - 7P or covering provider during after hours 7P -7A, for this patient?   Check the care team in [facility] and look for a) attending/consulting [facility] provider listed and b) the [facility] team listed Log into [email or web address] and use [place]'s universal password to access. If you do not have the password, please contact the hospital operator. Locate the [facility] provider you are looking for under [facility] and page to a number that you can be directly reached. If you still have difficulty reaching the provider, please page the [facility] (Director on Call) for the Hospitalists listed on amion for assistance.  08/14/2023, 5:38 PM

## 2023-08-14 NOTE — ED Triage Notes (Signed)
 Pt to triage, pt being eval by PA

## 2023-08-14 NOTE — ED Provider Notes (Signed)
 [place] [facility] AT [facility] Provider Note   CSN: [id number] Arrival date & time: 08/14/23  1328     Patient presents with: Dizziness   John Salazar is a 78 y.o. male.    Dizziness    Patient has a history of hypertension hypercholesterolemia anxiety BPH acid reflux chronic kidney disease coronary artery disease cerebral aneurysm.  Patient presents ED for evaluation of episodes of dizziness feeling off balance somewhat confused.  Patient states he has had some intermittent episodes the last week.  He noticed especially when he was up and moving around.  He was out playing golf today when he became very dizzy.  He felt off balance and somewhat confused.  Patient was concerned because this felt similar to when he had a clot in one of the blood vessels up to his brain before.  Patient denies any vomiting or diarrhea.  He is not having chest pain.  Is not having shortness of breath.  Prior to Admission medications   Medication Sig Start Date End Date Taking? Authorizing Provider  amLODipine  (NORVASC ) 2.5 MG tablet Take 1 tablet (2.5 mg total) by mouth daily. Patient taking differently: Take 2.5 mg by mouth 2 (two) times daily. 12/09/22   Okey Vina GAILS, MD  clopidogrel  (PLAVIX ) 75 MG tablet TAKE ONE TABLET BY MOUTH DAILY 06/21/23   Okey Vina GAILS, MD  escitalopram  (LEXAPRO ) 20 MG tablet Take 1 tablet (20 mg total) by mouth daily. 03/23/23   Dugal, Tabitha, FNP  famotidine  (PEPCID ) 20 MG tablet Take 20 mg by mouth daily as needed for heartburn or indigestion.    [provider]  fluticasone  (FLONASE ) 50 MCG/ACT nasal spray Place 2 sprays into both nostrils daily. 06/27/23   Wendee Lynwood HERO, NP  isosorbide  mononitrate (IMDUR ) 30 MG 24 hr tablet Take 0.5 tablets (15 mg total) by mouth daily. AS NEEDED 02/14/23   Okey Vina GAILS, MD  losartan  (COZAAR ) 50 MG tablet Take 0.5 tablets (25 mg total) by mouth daily. 02/03/23   Vannie Reche RAMAN, NP  nitroGLYCERIN   (NITROSTAT ) 0.4 MG SL tablet Place 1 tablet (0.4 mg total) under the tongue every 5 (five) minutes as needed for chest pain. 02/14/23   Okey Vina GAILS, MD  rosuvastatin  (CRESTOR ) 40 MG tablet Take one (1) tablet by mouth (40 mg) daily. 12/22/22   Okey Vina GAILS, MD  tamsulosin  (FLOMAX ) 0.4 MG CAPS capsule Take one capsule once daily ( thirty minutes after same meal daily) 11/28/22   Okey Vina GAILS, MD    Allergies: Bee venom and Penicillins    Review of Systems  Neurological:  Positive for dizziness.    Updated Vital Signs BP 129/75   Pulse 61   Temp 97.7 F (36.5 C)   Resp 16   Ht 1.753 m (5' 9)   Wt 77.1 kg   SpO2 100%   BMI 25.10 kg/m   Physical Exam Vitals and nursing note reviewed.  Constitutional:      General: He is not in acute distress.    Appearance: He is well-developed.  HENT:     Head: Normocephalic and atraumatic.     Right Ear: External ear normal.     Left Ear: External ear normal.  Eyes:     General: No visual field deficit or scleral icterus.       Right eye: No discharge.        Left eye: No discharge.     Conjunctiva/sclera: Conjunctivae normal.  Neck:  Trachea: No tracheal deviation.  Cardiovascular:     Rate and Rhythm: Normal rate and regular rhythm.  Pulmonary:     Effort: Pulmonary effort is normal. No respiratory distress.     Breath sounds: Normal breath sounds. No stridor. No wheezing or rales.  Abdominal:     General: Bowel sounds are normal. There is no distension.     Palpations: Abdomen is soft.     Tenderness: There is no abdominal tenderness. There is no guarding or rebound.  Musculoskeletal:        General: No tenderness or deformity.     Cervical back: Neck supple.  Skin:    General: Skin is warm and dry.     Findings: No rash.  Neurological:     General: No focal deficit present.     Mental Status: He is alert and oriented to person, place, and time.     Cranial Nerves: No cranial nerve deficit, dysarthria or facial  asymmetry.     Sensory: No sensory deficit.     Motor: No abnormal muscle tone, seizure activity or pronator drift.     Coordination: Coordination normal.     Comments:  able to hold both legs off bed for 5 seconds, sensation intact in all extremities,  no left or right sided neglect, normal finger-nose exam bilaterally, no nystagmus noted   Psychiatric:        Mood and Affect: Mood normal.     (all labs ordered are listed, but only abnormal results are displayed) Labs Reviewed  CBC - Abnormal; Notable for the following components:      Result Value   RBC 4.15 (*)    Hemoglobin 12.7 (*)    HCT 38.4 (*)    All other components within normal limits  COMPREHENSIVE METABOLIC PANEL WITH GFR - Abnormal; Notable for the following components:   Glucose, Bld 163 (*)    Creatinine, Ser 2.12 (*)    GFR, Estimated 31 (*)    All other components within normal limits  I-STAT CHEM 8, ED - Abnormal; Notable for the following components:   BUN 25 (*)    Creatinine, Ser 2.20 (*)    Glucose, Bld 156 (*)    Hemoglobin 12.9 (*)    HCT 38.0 (*)    All other components within normal limits  CBG MONITORING, ED - Abnormal; Notable for the following components:   Glucose-Capillary 164 (*)    All other components within normal limits  PROTIME-INR  APTT  DIFFERENTIAL  ETHANOL  RAPID URINE DRUG SCREEN, HOSP PERFORMED    EKG: EKG Interpretation Date/Time:  Monday August 14 2023 14:04:31 EDT Ventricular Rate:  76 PR Interval:  156 QRS Duration:  84 QT Interval:  400 QTC Calculation: 450 R Axis:   -1  Text Interpretation: Normal sinus rhythm Normal ECG When compared with ECG of 03-Feb-2023 14:05, No significant change since last tracing Confirmed by Randol Simmonds [phone]) on 08/14/2023 4:08:09 PM  Radiology: CT ANGIO HEAD NECK W WO CM Result Date: 08/14/2023 CLINICAL DATA:  Neuro deficit, concern for stroke, dizziness intermittently for the past week. EXAM: CT ANGIOGRAPHY HEAD AND NECK WITH AND WITHOUT  CONTRAST TECHNIQUE: Multidetector CT imaging of the head and neck was performed using the standard protocol during bolus administration of intravenous contrast. Multiplanar CT image reconstructions and MIPs were obtained to evaluate the vascular anatomy. Carotid stenosis measurements (when applicable) are obtained utilizing NASCET criteria, using the distal internal carotid diameter as the denominator. RADIATION DOSE  REDUCTION: This exam was performed according to the departmental dose-optimization program which includes automated exposure control, adjustment of the mA and/or kV according to patient size and/or use of iterative reconstruction technique. CONTRAST:  75mL OMNIPAQUE  IOHEXOL  350 MG/ML SOLN COMPARISON:  CTA head and neck 11/21/2022. FINDINGS: CT HEAD FINDINGS Brain: No acute intracranial hemorrhage. No CT evidence of acute infarct. Nonspecific hypoattenuation in the periventricular and subcortical white matter favored to reflect chronic microvascular ischemic changes. No edema, mass effect, or midline shift. The basilar cisterns are patent. Ventricles: The ventricles are normal. Vascular: Atherosclerotic calcifications of the carotid siphons and intracranial vertebral arteries. No hyperdense vessel. Stent extending from the supraclinoid left ICA to the left M1 segment. Skull: No acute or aggressive finding. Orbits: Bilateral lens replacement. Sinuses: Mucosal thickening in the right sphenoid sinus. Other: Mastoid air cells are clear. CTA NECK FINDINGS Aortic arch: 3 vessel configuration of the aortic arch. Atherosclerosis of the visualized arch involving the aortic arch vessel origins without significant stenosis. Pulmonary arteries: As permitted by contrast timing, there are no filling defects in the visualized pulmonary arteries. Subclavian arteries: The subclavian arteries are patent bilaterally. Right carotid system: Patent from the origin to the skull base. Stent extending from the carotid  bifurcation into the mid cervical ICA. Stent is patent. No high-grade stenosis, aneurysm, or dissection. Left carotid system: Patent from the origin to the skull base. Stent extending from the carotid bifurcation to the mid cervical ICA. No high-grade stenosis, aneurysm, or dissection. Vertebral arteries: Patent from the origins to the vertebrobasilar confluence. Atherosclerosis at the left V1 segment resulting in mild stenosis. Mild tortuosity of the left V1 segment. Additional atherosclerosis of the bilateral V4 segments without significant stenosis. No evidence of dissection. Skeleton: No acute findings. Degenerative changes in the cervical spine. Disc space narrowing greatest at C5-6. Other neck: The visualized airway is patent. No cervical lymphadenopathy. Upper chest: Centrilobular emphysema in the lung apices. Review of the MIP images confirms the above findings CTA HEAD FINDINGS ANTERIOR CIRCULATION: The intracranial internal carotid arteries are patent bilaterally. Atherosclerotic plaque involving the carotid siphons resulting in mild stenosis, right greater than left. Stent noted extending from the supraclinoid left ICA to the left M1 segment. MCAs: Stent in the proximal M1 segment of the left MCA is patent. Left MCA branches are patent. Mild narrowing of the right M1 segment is similar to prior. Right MCA branches are patent. ACAs: The anterior cerebral arteries are patent bilaterally. POSTERIOR CIRCULATION: No significant stenosis, proximal occlusion, aneurysm, or vascular malformation. PCAs: Patent bilaterally. Similar mild atherosclerotic irregularity of the right P2 segment. Additional mild-to-moderate atherosclerotic irregularity of the left P2 segment. Pcomm: Not well visualized. SCAs: The superior cerebellar arteries are patent bilaterally. Basilar artery: Patent AICAs: Patent PICAs: Patent Vertebral arteries: The intracranial vertebral arteries are patent. Venous sinuses: As permitted by contrast  timing, patent. Anatomic variants: None Review of the MIP images confirms the above findings IMPRESSION: Bilateral common/internal carotid artery stents in the neck and stent extending from the left supraclinoid ICA to left M1 segment are patent. No large vessel occlusion. No high-grade stenosis, dissection, or aneurysm. No CT evidence of acute intracranial abnormality. Mild atherosclerosis as above. Aortic Atherosclerosis (ICD10-I70.0) and Emphysema (ICD10-J43.9). Electronically Signed   By: Donnice Mania M.D.   On: 08/14/2023 14:53     Procedures   Medications Ordered in the ED  sodium chloride  0.9 % bolus 500 mL (has no administration in time range)  iohexol  (OMNIPAQUE ) 350 MG/ML injection 75 mL (  75 mLs Intravenous Contrast Given 08/14/23 1422)    Clinical Course as of 08/14/23 1621  Mon Aug 14, 2023  1608 Comprehensive metabolic panel(!) Increased since previous  [JK]  1609 CBC(!) nl [JK]  1609 CT ANGIO HEAD NECK W WO CM Negative for acute changes [JK]    Clinical Course User Index [JK] Randol Simmonds, MD                                 Medical Decision Making Amount and/or Complexity of Data Reviewed Labs:  Decision-making details documented in ED Course. Radiology:  Decision-making details documented in ED Course.   Patient presented to the ED for evaluation of dizziness.  Symptoms concerning for the possibility of near syncope versus TIA occult stroke. Patient's laboratory test did not show any signs of significant anemia to account for his weakness.  He is labs however do show elevated creatinine.  Increased from his baseline.  This raise concerns possibility of acute kidney injury leading to near syncope.  Consider the possibility of cardiac dysrhythmia.  EKG currently shows normal sinus rhythm.  Patient CT scans did not show any signs of an occlusion.  Is possible he could be having TIA occult stroke type symptoms.  I think the patient would benefit from IV fluids.  I also  think .  MRI may be reasonable as well. I will consult with the medical service admission further workup  Case discussed with Dr Seena    Final diagnoses:  AKI (acute kidney injury) [facility])  Dizziness    ED Discharge Orders     None          Randol Simmonds, MD 08/14/23 [phone]

## 2023-08-14 NOTE — ED Triage Notes (Signed)
 Pt to er, pt states that he has been having some dizziness off and on for the past week, states that he was playing golf today and he had an episode where he got really dizzy, and off balance and confused. Pt states that the last time he had this he had a clot in his neck/brain.  Pt states that this happened at 9am today, states that his symptoms are better now, but still present.

## 2023-08-14 NOTE — ED Provider Triage Note (Signed)
 Emergency Medicine Provider Triage Evaluation Note  John Salazar , a 78 y.o. male  was evaluated in triage.  Pt complains of sudden onset dizziness while playing golf today around 9 AM.  Off balance, confused.  He had some dizziness off and on for the last week.  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 Dolphus. Dizziness and confusion improved at time of triage, but reports some head swimming sensation and mild headache 3/10.  Review of Systems  Positive: Dizziness, confusion Negative:   Physical Exam  BP (!) 107/57   Pulse 76   Temp 98 F (36.7 C)   Resp 16   SpO2 96%  Gen:   Awake, no distress   Resp:  Normal effort  MSK:   Moves extremities without difficulty  Other:  Cranial nerves II through XII grossly intact.  Intact finger-nose, intact heel-to-shin.  Romberg negative, gait normal.  Alert and oriented x3.  Moves all 4 limbs spontaneously, normal coordination.  No pronator drift.  Intact strength 5 out of 5 bilateral upper and lower extremities. Normal ambulation on my exam, no ataxia.p  Medical Decision Making  Medically screening exam initiated at 1:45 PM.  Appropriate orders placed.  John Salazar was informed that the remainder of the evaluation will be completed by another provider, this initial triage assessment does not replace that evaluation, and the importance of remaining in the ED until their evaluation is complete.  Workup initiated in triage    John Salazar, [place] 08/14/23 1347

## 2023-08-15 ENCOUNTER — Observation Stay (HOSPITAL_BASED_OUTPATIENT_CLINIC_OR_DEPARTMENT_OTHER)

## 2023-08-15 DIAGNOSIS — G459 Transient cerebral ischemic attack, unspecified: Secondary | ICD-10-CM

## 2023-08-15 LAB — ECHOCARDIOGRAM COMPLETE
Area-P 1/2: 2.83 cm2
Calc EF: 60.1 %
Height: 69 in
S' Lateral: 2.3 cm
Single Plane A2C EF: 65.1 %
Single Plane A4C EF: 54.2 %
Weight: 2720 [oz_av]

## 2023-08-15 LAB — HEMOGLOBIN A1C
Hgb A1c MFr Bld: 5.5 % (ref 4.8–5.6)
Mean Plasma Glucose: 111.15 mg/dL

## 2023-08-15 LAB — CBC
HCT: 39.4 % (ref 39.0–52.0)
Hemoglobin: 13.2 g/dL (ref 13.0–17.0)
MCH: 30.8 pg (ref 26.0–34.0)
MCHC: 33.5 g/dL (ref 30.0–36.0)
MCV: 92.1 fL (ref 80.0–100.0)
Platelets: 152 K/uL (ref 150–400)
RBC: 4.28 MIL/uL (ref 4.22–5.81)
RDW: 13.5 % (ref 11.5–15.5)
WBC: 4.8 K/uL (ref 4.0–10.5)
nRBC: 0 % (ref 0.0–0.2)

## 2023-08-15 LAB — LIPID PANEL
Cholesterol: 116 mg/dL (ref 0–200)
HDL: 49 mg/dL (ref >40)
LDL Cholesterol: 50 mg/dL (ref 0–99)
Total CHOL/HDL Ratio: 2.4 ratio
Triglycerides: 83 mg/dL (ref <150)
VLDL: 17 mg/dL (ref 0–40)

## 2023-08-15 LAB — COMPREHENSIVE METABOLIC PANEL WITH GFR
ALT: 19 U/L (ref 0–44)
AST: 18 U/L (ref 15–41)
Albumin: 3.4 g/dL — ABNORMAL LOW (ref 3.5–5.0)
Alkaline Phosphatase: 59 U/L (ref 38–126)
Anion gap: 9 (ref 5–15)
BUN: 17 mg/dL (ref 8–23)
CO2: 24 mmol/L (ref 22–32)
Calcium: 8.9 mg/dL (ref 8.9–10.3)
Chloride: 109 mmol/L (ref 98–111)
Creatinine, Ser: 1.42 mg/dL — ABNORMAL HIGH (ref 0.61–1.24)
GFR, Estimated: 51 mL/min — ABNORMAL LOW (ref >60)
Glucose, Bld: 93 mg/dL (ref 70–99)
Potassium: 4.1 mmol/L (ref 3.5–5.1)
Sodium: 142 mmol/L (ref 135–145)
Total Bilirubin: 0.6 mg/dL (ref 0.0–1.2)
Total Protein: 6.1 g/dL — ABNORMAL LOW (ref 6.5–8.1)

## 2023-08-15 LAB — ETHANOL: Alcohol, Ethyl (B): <15 mg/dL (ref <15)

## 2023-08-15 MED ORDER — ORAL CARE MOUTH RINSE: 15.0000 mL | OROMUCOSAL | Status: DC | PRN

## 2023-08-15 NOTE — Discharge Instructions (Signed)
 Follow with John Lynwood HERO, NP in 5-7 days  Please get a complete blood count and chemistry panel checked by your Primary MD at your next visit, and again as instructed by your Primary MD. Please get your medications reviewed and adjusted by your Primary MD.  Please request your Primary MD to go over all Hospital Tests and Procedure/Radiological results at the follow up, please get all Hospital records sent to your Prim MD by signing hospital release before you go home.  In some cases, there will be blood work, cultures and biopsy results pending at the time of your discharge. Please request that your primary care M.D. goes through all the records of your hospital data and follows up on these results.  If you had Pneumonia of Lung problems at the Hospital: Please get a 2 view Chest X ray done in 6-8 weeks after hospital discharge or sooner if instructed by your Primary MD.  If you have Congestive Heart Failure: Please call your Cardiologist or Primary MD anytime you have any of the following symptoms:  1) 3 pound weight gain in 24 hours or 5 pounds in 1 week  2) shortness of breath, with or without a dry hacking cough  3) swelling in the hands, feet or stomach  4) if you have to sleep on extra pillows at night in order to breathe  Follow cardiac low salt diet and 1.5 lit/day fluid restriction.  If you have diabetes Accuchecks 4 times/day, Once in AM empty stomach and then before each meal. Log in all results and show them to your primary doctor at your next visit. If any glucose reading is under 80 or above 300 call your primary MD immediately.  If you have Seizure/Convulsions/Epilepsy: Please do not drive, operate heavy machinery, participate in activities at heights or participate in high speed sports until you have seen by Primary MD or a Neurologist and advised to do so again. Per [place]  DMV statutes, patients with seizures are not allowed to drive until they have been  seizure-free for six months.  Use caution when using heavy equipment or power tools. Avoid working on ladders or at heights. Take showers instead of baths. Ensure the water temperature is not too high on the home water heater. Do not go swimming alone. Do not lock yourself in a room alone (i.e. bathroom). When caring for infants or small children, sit down when holding, feeding, or changing them to minimize risk of injury to the child in the event you have a seizure. Maintain good sleep hygiene. Avoid alcohol.   If you had Gastrointestinal Bleeding: Please ask your Primary MD to check a complete blood count within one week of discharge or at your next visit. Your endoscopic/colonoscopic biopsies that are pending at the time of discharge, will also need to followed by your Primary MD.  Get Medicines reviewed and adjusted. Please take all your medications with you for your next visit with your Primary MD  Please request your Primary MD to go over all hospital tests and procedure/radiological results at the follow up, please ask your Primary MD to get all Hospital records sent to his/her office.  If you experience worsening of your admission symptoms, develop shortness of breath, life threatening emergency, suicidal or homicidal thoughts you must seek medical attention immediately by calling 911 or calling your MD immediately  if symptoms less severe.  You must read complete instructions/literature along with all the possible adverse reactions/side effects for all the Medicines you  take and that have been prescribed to you. Take any new Medicines after you have completely understood and accpet all the possible adverse reactions/side effects.   Do not drive or operate heavy machinery when taking Pain medications.   Do not take more than prescribed Pain, Sleep and Anxiety Medications  Special Instructions: If you have smoked or chewed Tobacco  in the last 2 yrs please stop smoking, stop any regular  Alcohol  and or any Recreational drug use.  Wear Seat belts while driving.  Please note You were cared for by a hospitalist during your hospital stay. If you have any questions about your discharge medications or the care you received while you were in the hospital after you are discharged, you can call the unit and asked to speak with the hospitalist on call if the hospitalist that took care of you is not available. Once you are discharged, your primary care physician will handle any further medical issues. Please note that NO REFILLS for any discharge medications will be authorized once you are discharged, as it is imperative that you return to your primary care physician (or establish a relationship with a primary care physician if you do not have one) for your aftercare needs so that they can reassess your need for medications and monitor your lab values.  You can reach the hospitalist office at phone [phone] or fax [phone]   If you do not have a primary care physician, you can call [phone] for a physician referral.  Activity: As tolerated with Full fall precautions use walker/cane & assistance as needed    Diet: regular  Disposition Home

## 2023-08-15 NOTE — Evaluation (Signed)
 Speech Language Pathology Evaluation Patient Details Name: John Salazar MRN: [id number] DOB: 04/09/1945 Today's Date: 08/15/2023 Time: 9159-9144 SLP Time Calculation (min) (ACUTE ONLY): 15 min  Problem List:  Patient Active Problem List   Diagnosis Date Noted   TIA (transient ischemic attack) 08/14/2023   Acute cough 06/27/2023   Bronchitis 06/27/2023   Sudden idiopathic hearing loss 06/27/2023   Short-term memory loss 03/15/2023   Gastroesophageal reflux disease with esophagitis without hemorrhage 03/15/2023   Gastropathy 03/15/2023   Trigger index finger of left hand 03/15/2023   OSA (obstructive sleep apnea) 11/14/2022   Depression, major, single episode, moderate (HCC) 06/10/2022   Hx of long term use of blood thinners 06/10/2022   Vitamin B12 deficiency 05/23/2022   Coronary artery disease involving native coronary artery of native heart with angina pectoris (HCC) 08/22/2021   Carotid artery disease (HCC) 08/22/2021   Ascending aorta dilation (HCC) 08/22/2021   Incomplete RBBB 08/05/2021   Chronic diarrhea 11/23/2020   Brain aneurysm 05/18/2020   Snoring 02/27/2019   Accelerating angina (HCC) 11/15/2018   DNR (do not resuscitate) 08/23/2018   Hx of cataract surgery 08/23/2018   History of right common carotid artery stent placement 06/18/2018   Prediabetes 03/19/2018   Pain in right knee 03/05/2018   CKD (chronic kidney disease) stage 3, GFR 30-59 ml/min (HCC) 02/05/2018   Barrett esophagus 01/08/2018   Benign essential HTN 01/08/2018   BPH (benign prostatic hyperplasia) 01/08/2018   Hyperlipidemia LDL goal <70 01/08/2018   Anemia 01/08/2018   Imbalance 06/26/2017   Past Medical History:  Past Medical History:  Diagnosis Date   Anxiety    Ascending aorta dilation (HCC) 08/22/2021   Echocardiogram 04/2021: 40 mm   Barrett esophagus    BPH (benign prostatic hyperplasia)    Cerebral aneurysm    Chronic kidney disease    stage 2   Colon polyps    Coronary artery  disease    mild-mod non-obstructive CAD   Erectile dysfunction    GERD (gastroesophageal reflux disease)    History of kidney stones    History of stomach ulcers    Hypercholesteremia    Hypertension    Internal carotid artery stenosis, right 06/29/2020   Positive TB test    in the past   Past Surgical History:  Past Surgical History:  Procedure Laterality Date   APPENDECTOMY  1970   CATARACT EXTRACTION W/ INTRAOCULAR LENS  IMPLANT, BILATERAL     COLONOSCOPY  10/2012   ESOPHAGOGASTRODUODENOSCOPY ENDOSCOPY  10/2012   HERNIA REPAIR     IR 3D INDEPENDENT WKST  04/14/2020   IR ANGIO INTRA EXTRACRAN SEL COM CAROTID INNOMINATE BILAT MOD SED  08/09/2017   IR ANGIO INTRA EXTRACRAN SEL COM CAROTID INNOMINATE BILAT MOD SED  11/08/2018   IR ANGIO INTRA EXTRACRAN SEL COM CAROTID INNOMINATE BILAT MOD SED  04/14/2020   IR ANGIO INTRA EXTRACRAN SEL COM CAROTID INNOMINATE UNI L MOD SED  06/29/2020   IR ANGIO INTRA EXTRACRAN SEL INTERNAL CAROTID UNI L MOD SED  05/20/2020   IR ANGIO VERTEBRAL SEL VERTEBRAL BILAT MOD SED  08/09/2017   IR ANGIO VERTEBRAL SEL VERTEBRAL BILAT MOD SED  11/08/2018   IR ANGIO VERTEBRAL SEL VERTEBRAL BILAT MOD SED  04/14/2020   IR ANGIOGRAM FOLLOW UP STUDY  05/20/2020   IR INTRAVSC STENT CERV CAROTID W/EMB-PROT MOD SED INCL ANGIO  08/23/2017   IR INTRAVSC STENT CERV CAROTID W/EMB-PROT MOD SED INCL ANGIO  06/29/2020   IR RADIOLOGIST EVAL & MGMT  05/05/2020   IR RADIOLOGIST EVAL & MGMT  06/03/2020   IR RADIOLOGIST EVAL & MGMT  07/15/2020   IR TRANSCATH/EMBOLIZ  05/18/2020   IR US  GUIDE VASC ACCESS RIGHT  04/14/2020   IR US  GUIDE VASC ACCESS RIGHT  05/20/2020   IR US  GUIDE VASC ACCESS RIGHT  06/29/2020   LEFT HEART CATH AND CORONARY ANGIOGRAPHY N/A 11/15/2018   Procedure: LEFT HEART CATH AND CORONARY ANGIOGRAPHY;  Surgeon: Mady Bruckner, MD;  Location: [facility];  Service: Cardiovascular;  Laterality: N/A;   PROSTATE SURGERY  1999   20 years ago, enlarged prostate   RADIOLOGY WITH  ANESTHESIA N/A 08/23/2017   Procedure: IR WITH ANESTHESIA STENT PLACEMENT;  Surgeon: Dolphus Carrion, MD;  Location: [facility] OR;  Service: Radiology;  Laterality: N/A;   RADIOLOGY WITH ANESTHESIA N/A 05/18/2020   Procedure: IR WITH ANESTHESIA EMBOLIZATION;  Surgeon: Dolphus Carrion, MD;  Location: [facility] OR;  Service: Radiology;  Laterality: N/A;   RADIOLOGY WITH ANESTHESIA Left 06/29/2020   Procedure: RADIOLOGY WITH ANESTHESIA  LEFT CAROTID STENT PLACEMENT;  Surgeon: Dolphus Carrion, MD;  Location: [facility] OR;  Service: Radiology;  Laterality: Left;   HPI:  John Salazar is a 78 y.o. male with medical history significant of hypertension, hyperlipidemia, GERD, carotid artery disease status post stenting, CAD, CKD 3, BPH, OSA, depression, brain aneurysm, anemia presenting with intermittent dizziness, confusion. MRI negative   Assessment / Plan / Recommendation Clinical Impression  Patient was evaluated via the Cognistat to assess cognitive linguistic functioning. Patient scored WFL on all subtests and reports he is at baseline level of functioning. Informally, patient with functional expressive/receptive language, attention, awareness, problem solving and memory. Patient with functional oral mechanism exam and is 100% intelligible at the conversational level. No further ST needs.     SLP Assessment  SLP Recommendation/Assessment: Patient does not need any further Speech Language Pathology Services     Assistance Recommended at Discharge  None  Functional Status Assessment Patient has not had a recent decline in their functional status     SLP Evaluation Cognition  Overall Cognitive Status: Within Functional Limits for tasks assessed Arousal/Alertness: Awake/alert Orientation Level: Oriented X4 Year: 2025 Month: July Day of Week: Correct Memory: Appears intact Awareness: Appears intact Problem Solving: Appears intact Safety/Judgment: Appears intact       Comprehension  Auditory  Comprehension Overall Auditory Comprehension: Appears within functional limits for tasks assessed    Expression Expression Primary Mode of Expression: Verbal Verbal Expression Overall Verbal Expression: Appears within functional limits for tasks assessed   Oral / Motor  Oral Motor/Sensory Function Overall Oral Motor/Sensory Function: Within functional limits Motor Speech Overall Motor Speech: Appears within functional limits for tasks assessed            Jamaurie Bernier M.A., CCC-SLP 08/15/2023, 9:10 AM

## 2023-08-15 NOTE — Evaluation (Signed)
 Physical Therapy Brief Evaluation and Discharge Note Patient Details Name: John Salazar MRN: [id number] DOB: 1945-12-11 Today's Date: 08/15/2023   History of Present Illness  Pt is a 78 y.o. M who presents 08/14/2023 with imbalance. MRI negative for acute intracranial abnormality and CTA with bilateral common/internal carotid artery stents in the neck and stent extending from the left supraclinoid ICA to left M1 segment are patent. Significant PMH: HTN, HLD, CAD s/p stenting, CKD3, BPH, brain aneurysm, anemia.  Clinical Impression  Patient evaluated by Physical Therapy with no further acute PT needs identified. Pt reports resolution of symptoms. Overall, he is mobilizing well, ambulating hallway distances and negotiating stairs independently. Able to perform high level balance activities I.e. head turns, stepping over obstacles, various gait speeds with no gross unsteadiness or dizziness reports. Pt was positive for orthostatic hypotension (see below), but was asymptomatic. Education provided regarding fluid intake and compression garments. Pt also with noted nystagmus with smooth pursuits to L, but due to no reproduction of symptoms on evaluation, did not do further vestibular testing. All education has been completed and the patient has no further questions. No follow-up Physical Therapy or equipment needs. PT is signing off. Thank you for this referral.     Position BP HR  Supine 168/115 (124) 75  Sitting 171/91 (115) 76  Standing 0 minutes 147/100 (110) 88  Standing 3 minutes 176/105 (124)        PT Assessment Patient does not need any further PT services  Assistance Needed at Discharge  None    Equipment Recommendations None recommended by PT  Recommendations for Other Services       Precautions/Restrictions Precautions Precautions: None Restrictions Weight Bearing Restrictions Per Provider Order: No        Mobility  Bed Mobility   Supine/Sidelying to sit: Independent       Transfers Overall transfer level: Independent Equipment used: None                    Ambulation/Gait Ambulation/Gait assistance: Independent Gait Distance (Feet): 300 Feet Assistive device: None Gait Pattern/deviations: WFL(Within Functional Limits)      Home Activity Instructions    Stairs Stairs: Yes Stairs assistance: Modified independent (Device/Increase time) Stair Management: One rail Right Number of Stairs: 5    Modified Rankin (Stroke Patients Only) Modified Rankin (Stroke Patients Only) Pre-Morbid Rankin Score: No symptoms Modified Rankin: No symptoms      Balance Overall balance assessment: No apparent balance deficits (not formally assessed)                        Pertinent Vitals/Pain PT - Brief Vital Signs All Vital Signs Stable: Yes Pain Assessment Pain Assessment: No/denies pain     Home Living Family/patient expects to be discharged to:: Private residence Living Arrangements: Spouse/significant other Available Help at Discharge: Family Home Environment: Stairs to enter  [employer] of Steps: 6 Home Equipment: None        Prior Function Level of Independence: Independent      UE/LE Assessment   UE ROM/Strength/Tone/Coordination: WFL    LE ROM/Strength/Tone/Coordination: [facility]      Communication   Communication Communication: No apparent difficulties     Cognition Overall Cognitive Status: Appears within functional limits for tasks assessed/performed       General Comments      Exercises     Assessment/Plan    PT Problem List         PT Visit  Diagnosis Unsteadiness on feet (R26.81)    No Skilled PT All education completed;Patient at baseline level of functioning;Patient is independent with all acitivity/mobility   Co-evaluation                AMPAC 6 Clicks Help needed turning from your back to your side while in a flat bed without using bedrails?: None Help needed moving from  lying on your back to sitting on the side of a flat bed without using bedrails?: None Help needed moving to and from a bed to a chair (including a wheelchair)?: None Help needed standing up from a chair using your arms (e.g., wheelchair or bedside chair)?: None Help needed to walk in hospital room?: None Help needed climbing 3-5 steps with a railing? : None 6 Click Score: 24      End of Session   Activity Tolerance: Patient tolerated treatment well Patient left: in bed;with call bell/phone within reach Nurse Communication: Mobility status PT Visit Diagnosis: Unsteadiness on feet (R26.81)     Time: 9188-9165 PT Time Calculation (min) (ACUTE ONLY): 23 min  Charges:   PT Evaluation $PT Eval Low Complexity: 1 Low      Aleck Salazar, PT, DPT Acute Rehabilitation Services Office [phone]   John Salazar  08/15/2023, 9:22 AM

## 2023-08-15 NOTE — Progress Notes (Signed)
 OT Cancellation Note  Patient Details Name: John Salazar MRN: [id number] DOB: 22-Apr-1945   Cancelled Treatment:    Reason Eval/Treat Not Completed: OT screened, no needs identified, will sign off- per PT patient independent.  No OT needs identified at this time.   [name], OT Acute Rehabilitation Services Office [phone] Secure Chat Preferred    Etta GORMAN [place] 08/15/2023, 10:30 AM

## 2023-08-15 NOTE — Discharge Summary (Signed)
 Physician Discharge Summary  John Salazar FMW:[id number] DOB: 06-24-1945 DOA: 08/14/2023  PCP: Wendee Lynwood HERO, NP  Admit date: 08/14/2023 Discharge date: 08/15/2023  Admitted From: home Disposition:  home  Recommendations for Outpatient Follow-up:  Follow up with PCP in 1-2 weeks Please obtain BMP/CBC in one week  Home Health: none Equipment/Devices: none  Discharge Condition: stable CODE STATUS: Full code Diet Orders (From admission, onward)     Start     Ordered   08/14/23 2110  Diet Heart Room service appropriate? Yes; Fluid consistency: Thin  Diet effective now       Question Answer Comment  Room service appropriate? Yes   Fluid consistency: Thin      08/14/23 2109            HPI: Per admitting MD, John Salazar is a 78 y.o. male with medical history significant of hypertension, hyperlipidemia, GERD, carotid artery disease status post stenting, CAD, CKD 3, BPH, OSA, depression, brain aneurysm, anemia presenting with intermittent dizziness, confusion. Patient reports about a week of intermittent balance issues and vision changes especially when he is up and active.  Today he was playing golf and had more severe episode of felling off balance and more blurry vision; where he also had some confusion.Felt similar to when he had issues with his carotid artery disease. Does report some history of intermittent diarrhea Denies fevers, chills, chest pain, shortness breath, abdominal pain, constipation, nausea, vomiting.  Denies any other focal neurologic deficits.  Hospital Course / Discharge diagnoses: Principal Problem:   TIA (transient ischemic attack) Active Problems:   Benign essential HTN   BPH (benign prostatic hyperplasia)   Hyperlipidemia LDL goal <70   Anemia   CKD (chronic kidney disease) stage 3, GFR 30-59 ml/min (HCC)   History of right common carotid artery stent placement   Brain aneurysm   Coronary artery disease involving native coronary artery of native  heart with angina pectoris (HCC)   Carotid artery disease (HCC)   Depression, major, single episode, moderate (HCC)   OSA (obstructive sleep apnea)   Gastroesophageal reflux disease with esophagitis without hemorrhage  Principal problem Presyncope -patient presents to the hospital with sensation of feeling off balance, wobbly, and this happened while he was out playing golf.  Episodes persisted even after he returned home, and decided to come to the emergency room.  He has had similar symptoms few years ago when he had blockage of his carotids.  He was admitted to the hospital, underwent a CT angiogram of the head and neck which showed patent bilateral carotid arteries stents and no other acute abnormalities, LVO, high-grade stenosis.  An MRI of the brain was obtained which was negative for CVA or any other acute findings.  He has returned to baseline, ambulatory, no further symptoms and will be discharged home in stable condition  Active problem Acute kidney injury on CKD 3a -baseline creatinine around 1.4, he was found to have a creatinine elevation to 2.1.  Felt to be dehydrated, which is likely to explain his presyncopal episodes.  Has received IV fluids, and his creatinine has not returned to baseline.  His ARB has been held and would recommend to discontinue on discharge up until he sees his PCP and gets his renal function rechecked.  He already has an appointment next week. Essential hypertension-continue home medications except losartan  Hyperlipidemia-continue statin Carotid disease-status post bilateral stenting, continue home Plavix , statin CAD-no chest pain, cath in 2020 with disease not amenable to stenting,  continue aggressive medical therapy Depression-continue home medications OSA-continue CPAP History of brain aneurysm-status post coiling in the past   Sepsis ruled out   Discharge Instructions   Allergies as of 08/15/2023       Reactions   Bee Venom Anaphylaxis, Shortness  Of Breath   Penicillins Rash   PATIENT HAS HAD A PCN REACTION WITH IMMEDIATE RASH, FACIAL/TONGUE/THROAT SWELLING, SOB, OR LIGHTHEADEDNESS WITH HYPOTENSION:  #  #  YES  #  #  Has patient had a PCN reaction causing severe rash involving mucus membranes or skin necrosis: no Has patient had a PCN reaction that required hospitalization: no Has patient had a PCN reaction occurring within the last 10 years: no Other Reaction(s): Not available        Medication List     STOP taking these medications    losartan  50 MG tablet Commonly known as: COZAAR        TAKE these medications    amLODipine  5 MG tablet Commonly known as: NORVASC  Take 5 mg by mouth daily. What changed: Another medication with the same name was removed. Continue taking this medication, and follow the directions you see here.   clopidogrel  75 MG tablet Commonly known as: PLAVIX  TAKE ONE TABLET BY MOUTH DAILY   escitalopram  20 MG tablet Commonly known as: LEXAPRO  Take 1 tablet (20 mg total) by mouth daily. What changed: when to take this   famotidine  20 MG tablet Commonly known as: PEPCID  Take 20 mg by mouth daily as needed for heartburn or indigestion.   fluticasone  50 MCG/ACT nasal spray Commonly known as: FLONASE  Place 2 sprays into both nostrils daily.   isosorbide  mononitrate 30 MG 24 hr tablet Commonly known as: IMDUR  Take 0.5 tablets (15 mg total) by mouth daily. AS NEEDED What changed:  when to take this reasons to take this additional instructions   nitroGLYCERIN  0.4 MG SL tablet Commonly known as: Nitrostat  Place 1 tablet (0.4 mg total) under the tongue every 5 (five) minutes as needed for chest pain.   pantoprazole  40 MG tablet Commonly known as: PROTONIX  Take 40 mg by mouth 2 (two) times daily.   rosuvastatin  40 MG tablet Commonly known as: CRESTOR  Take one (1) tablet by mouth (40 mg) daily. What changed: See the new instructions.   tamsulosin  0.4 MG Caps capsule Commonly known as:  Flomax  Take one capsule once daily ( thirty minutes after same meal daily) What changed:  how much to take how to take this when to take this        Follow-up Information     Wendee Lynwood HERO, NP Follow up in 1 week(s).   Specialties: Nurse Practitioner, Family Medicine Contact information: [address] [place] [address] [phone]                 Consultations: none  Procedures/Studies:  MR BRAIN WO CONTRAST Result Date: 08/14/2023 CLINICAL DATA:  Neuro deficit, acute, stroke suspected EXAM: MRI HEAD WITHOUT CONTRAST TECHNIQUE: Multiplanar, multiecho pulse sequences of the brain and surrounding structures were obtained without intravenous contrast. COMPARISON:  CTA head/neck from earlier today. FINDINGS: Brain: No acute infarction, acute hemorrhage, hydrocephalus, extra-axial collection or mass lesion. Moderate patchy white matter T2/FLAIR hyperintensities, compatible with chronic microvascular ischemic change. Vascular: Normal flow voids. Skull and upper cervical spine: Normal marrow signal. Sinuses/Orbits: Negative. IMPRESSION: 1. No evidence of acute intracranial abnormality. 2. Moderate microvascular ischemic change. Electronically Signed   By: Gilmore GORMAN Molt M.D.   On: 08/14/2023 21:25  CT ANGIO HEAD NECK W WO CM Result Date: 08/14/2023 CLINICAL DATA:  Neuro deficit, concern for stroke, dizziness intermittently for the past week. EXAM: CT ANGIOGRAPHY HEAD AND NECK WITH AND WITHOUT CONTRAST TECHNIQUE: Multidetector CT imaging of the head and neck was performed using the standard protocol during bolus administration of intravenous contrast. Multiplanar CT image reconstructions and MIPs were obtained to evaluate the vascular anatomy. Carotid stenosis measurements (when applicable) are obtained utilizing NASCET criteria, using the distal internal carotid diameter as the denominator. RADIATION DOSE REDUCTION: This exam was performed according to the departmental  dose-optimization program which includes automated exposure control, adjustment of the mA and/or kV according to patient size and/or use of iterative reconstruction technique. CONTRAST:  75mL OMNIPAQUE  IOHEXOL  350 MG/ML SOLN COMPARISON:  CTA head and neck 11/21/2022. FINDINGS: CT HEAD FINDINGS Brain: No acute intracranial hemorrhage. No CT evidence of acute infarct. Nonspecific hypoattenuation in the periventricular and subcortical white matter favored to reflect chronic microvascular ischemic changes. No edema, mass effect, or midline shift. The basilar cisterns are patent. Ventricles: The ventricles are normal. Vascular: Atherosclerotic calcifications of the carotid siphons and intracranial vertebral arteries. No hyperdense vessel. Stent extending from the supraclinoid left ICA to the left M1 segment. Skull: No acute or aggressive finding. Orbits: Bilateral lens replacement. Sinuses: Mucosal thickening in the right sphenoid sinus. Other: Mastoid air cells are clear. CTA NECK FINDINGS Aortic arch: 3 vessel configuration of the aortic arch. Atherosclerosis of the visualized arch involving the aortic arch vessel origins without significant stenosis. Pulmonary arteries: As permitted by contrast timing, there are no filling defects in the visualized pulmonary arteries. Subclavian arteries: The subclavian arteries are patent bilaterally. Right carotid system: Patent from the origin to the skull base. Stent extending from the carotid bifurcation into the mid cervical ICA. Stent is patent. No high-grade stenosis, aneurysm, or dissection. Left carotid system: Patent from the origin to the skull base. Stent extending from the carotid bifurcation to the mid cervical ICA. No high-grade stenosis, aneurysm, or dissection. Vertebral arteries: Patent from the origins to the vertebrobasilar confluence. Atherosclerosis at the left V1 segment resulting in mild stenosis. Mild tortuosity of the left V1 segment. Additional  atherosclerosis of the bilateral V4 segments without significant stenosis. No evidence of dissection. Skeleton: No acute findings. Degenerative changes in the cervical spine. Disc space narrowing greatest at C5-6. Other neck: The visualized airway is patent. No cervical lymphadenopathy. Upper chest: Centrilobular emphysema in the lung apices. Review of the MIP images confirms the above findings CTA HEAD FINDINGS ANTERIOR CIRCULATION: The intracranial internal carotid arteries are patent bilaterally. Atherosclerotic plaque involving the carotid siphons resulting in mild stenosis, right greater than left. Stent noted extending from the supraclinoid left ICA to the left M1 segment. MCAs: Stent in the proximal M1 segment of the left MCA is patent. Left MCA branches are patent. Mild narrowing of the right M1 segment is similar to prior. Right MCA branches are patent. ACAs: The anterior cerebral arteries are patent bilaterally. POSTERIOR CIRCULATION: No significant stenosis, proximal occlusion, aneurysm, or vascular malformation. PCAs: Patent bilaterally. Similar mild atherosclerotic irregularity of the right P2 segment. Additional mild-to-moderate atherosclerotic irregularity of the left P2 segment. Pcomm: Not well visualized. SCAs: The superior cerebellar arteries are patent bilaterally. Basilar artery: Patent AICAs: Patent PICAs: Patent Vertebral arteries: The intracranial vertebral arteries are patent. Venous sinuses: As permitted by contrast timing, patent. Anatomic variants: None Review of the MIP images confirms the above findings IMPRESSION: Bilateral common/internal carotid artery stents in the neck  and stent extending from the left supraclinoid ICA to left M1 segment are patent. No large vessel occlusion. No high-grade stenosis, dissection, or aneurysm. No CT evidence of acute intracranial abnormality. Mild atherosclerosis as above. Aortic Atherosclerosis (ICD10-I70.0) and Emphysema (ICD10-J43.9). Electronically  Signed   By: Donnice Mania M.D.   On: 08/14/2023 14:53     Subjective: - no chest pain, shortness of breath, no abdominal pain, nausea or vomiting.   Discharge Exam: BP (!) 139/94 (BP Location: Left Arm)   Pulse 70   Temp 97.9 F (36.6 C) (Oral)   Resp 18   Ht 5' 9 (1.753 m)   Wt 77.1 kg   SpO2 97%   BMI 25.10 kg/m   General: Pt is alert, awake, not in acute distress Cardiovascular: RRR, S1/S2 +, no rubs, no gallops Respiratory: CTA bilaterally, no wheezing, no rhonchi Abdominal: Soft, NT, ND, bowel sounds + Extremities: no edema, no cyanosis    The results of significant diagnostics from this hospitalization (including imaging, microbiology, ancillary and laboratory) are listed below for reference.     Microbiology: No results found for this or any previous visit (from the past 240 hours).   Labs: Basic Metabolic Panel: Recent Labs  Lab 08/14/23 1346 08/14/23 1358 08/15/23 0451  NA 143 141 142  K 3.6 3.6 4.1  CL 107 105 109  CO2 25  --  24  GLUCOSE 163* 156* 93  BUN 23 25* 17  CREATININE 2.12* 2.20* 1.42*  CALCIUM  9.2  --  8.9   Liver Function Tests: Recent Labs  Lab 08/14/23 1346 08/15/23 0451  AST 19 18  ALT 20 19  ALKPHOS 69 59  BILITOT 0.7 0.6  PROT 6.7 6.1*  ALBUMIN 3.9 3.4*   CBC: Recent Labs  Lab 08/14/23 1346 08/14/23 1358 08/15/23 0451  WBC 4.9  --  4.8  NEUTROABS 3.2  --   --   HGB 12.7* 12.9* 13.2  HCT 38.4* 38.0* 39.4  MCV 92.5  --  92.1  PLT 154  --  152   CBG: Recent Labs  Lab 08/14/23 1351  GLUCAP 164*   Hgb A1c Recent Labs    08/15/23 0451  HGBA1C 5.5   Lipid Profile Recent Labs    08/15/23 0451  CHOL 116  HDL 49  LDLCALC 50  TRIG 83  CHOLHDL 2.4   Thyroid  function studies No results for input(s): TSH, T4TOTAL, T3FREE, THYROIDAB in the last 72 hours.  Invalid input(s): FREET3 Urinalysis    Component Value Date/Time   BILIRUBINUR Negative 10/26/2021 0835   PROTEINUR Negative 10/26/2021  0835   UROBILINOGEN 0.2 10/26/2021 0835   NITRITE Negative 10/26/2021 0835   LEUKOCYTESUR Negative 10/26/2021 0835    FURTHER DISCHARGE INSTRUCTIONS:   Get Medicines reviewed and adjusted: Please take all your medications with you for your next visit with your Primary MD   Laboratory/radiological data: Please request your Primary MD to go over all hospital tests and procedure/radiological results at the follow up, please ask your Primary MD to get all Hospital records sent to his/her office.   In some cases, they will be blood work, cultures and biopsy results pending at the time of your discharge. Please request that your primary care M.D. goes through all the records of your hospital data and follows up on these results.   Also Note the following: If you experience worsening of your admission symptoms, develop shortness of breath, life threatening emergency, suicidal or homicidal thoughts you must seek medical attention  immediately by calling 911 or calling your MD immediately  if symptoms less severe.   You must read complete instructions/literature along with all the possible adverse reactions/side effects for all the Medicines you take and that have been prescribed to you. Take any new Medicines after you have completely understood and accpet all the possible adverse reactions/side effects.    Do not drive when taking Pain medications or sleeping medications (Benzodaizepines)   Do not take more than prescribed Pain, Sleep and Anxiety Medications. It is not advisable to combine anxiety,sleep and pain medications without talking with your primary care practitioner   Special Instructions: If you have smoked or chewed Tobacco  in the last 2 yrs please stop smoking, stop any regular Alcohol  and or any Recreational drug use.   Wear Seat belts while driving.   Please note: You were cared for by a hospitalist during your hospital stay. Once you are discharged, your primary care physician  will handle any further medical issues. Please note that NO REFILLS for any discharge medications will be authorized once you are discharged, as it is imperative that you return to your primary care physician (or establish a relationship with a primary care physician if you do not have one) for your post hospital discharge needs so that they can reassess your need for medications and monitor your lab values.  Time coordinating discharge: 35 minutes  SIGNED:  Nilda Fendt, MD, PhD 08/15/2023, 7:41 AM

## 2023-08-15 NOTE — TOC Transition Note (Signed)
 Transition of Care [facility]) - Discharge Note   Patient Details  Name: John Salazar MRN: [id number] Date of Birth: 1945-10-03  Transition of Care [facility]) CM/SW Contact:  Andrez JULIANNA George, RN Phone Number: 08/15/2023, 9:45 AM   Clinical Narrative:     Pt is discharging home with self care. No needs per therapies.  Pt has transportation home.  Final next level of care: Home/Self Care Barriers to Discharge: No Barriers Identified   Patient Goals and CMS Choice            Discharge Placement                       Discharge Plan and Services Additional resources added to the After Visit Summary for                                       Social Drivers of Health (SDOH) Interventions SDOH Screenings   Food Insecurity: No Food Insecurity (08/15/2023)  Housing: Low Risk  (08/15/2023)  Transportation Needs: No Transportation Needs (08/15/2023)  Utilities: Not At Risk (08/15/2023)  Alcohol Screen: Low Risk  (05/18/2023)  Depression (PHQ2-9): Low Risk  (06/27/2023)  Financial Resource Strain: Low Risk  (05/18/2023)  Physical Activity: Sufficiently Active (05/18/2023)  Social Connections: Socially Integrated (08/15/2023)  Stress: No Stress Concern Present (05/18/2023)  Tobacco Use: Medium Risk (08/14/2023)  Health Literacy: Adequate Health Literacy (05/18/2023)     Readmission Risk Interventions     No data to display

## 2023-08-15 NOTE — Plan of Care (Signed)
  Problem: Education: Goal: Knowledge of disease or condition will improve Outcome: Progressing   Problem: Ischemic Stroke/TIA Tissue Perfusion: Goal: Complications of ischemic stroke/TIA will be minimized Outcome: Progressing   Problem: Coping: Goal: Will verbalize positive feelings about self Outcome: Progressing Goal: Will identify appropriate support needs Outcome: Progressing   Problem: Self-Care: Goal: Ability to participate in self-care as condition permits will improve Outcome: Progressing Goal: Verbalization of feelings and concerns over difficulty with self-care will improve Outcome: Progressing Goal: Ability to communicate needs accurately will improve Outcome: Progressing   Problem: Nutrition: Goal: Risk of aspiration will decrease Outcome: Progressing   Problem: Education: Goal: Knowledge of General Education information will improve Description: Including pain rating scale, medication(s)/side effects and non-pharmacologic comfort measures Outcome: Progressing   Problem: Health Behavior/Discharge Planning: Goal: Ability to manage health-related needs will improve Outcome: Progressing   Problem: Clinical Measurements: Goal: Ability to maintain clinical measurements within normal limits will improve Outcome: Progressing Goal: Will remain free from infection Outcome: Progressing Goal: Diagnostic test results will improve Outcome: Progressing Goal: Respiratory complications will improve Outcome: Progressing Goal: Cardiovascular complication will be avoided Outcome: Progressing   Problem: Activity: Goal: Risk for activity intolerance will decrease Outcome: Progressing   Problem: Nutrition: Goal: Adequate nutrition will be maintained Outcome: Progressing   Problem: Coping: Goal: Level of anxiety will decrease Outcome: Progressing   Problem: Elimination: Goal: Will not experience complications related to bowel motility Outcome: Progressing Goal:  Will not experience complications related to urinary retention Outcome: Progressing   Problem: Pain Managment: Goal: General experience of comfort will improve and/or be controlled Outcome: Progressing   Problem: Safety: Goal: Ability to remain free from injury will improve Outcome: Progressing   Problem: Skin Integrity: Goal: Risk for impaired skin integrity will decrease Outcome: Progressing

## 2023-08-16 ENCOUNTER — Encounter: Admitting: Family

## 2023-08-16 ENCOUNTER — Telehealth: Payer: Self-pay

## 2023-08-16 ENCOUNTER — Telehealth: Payer: Self-pay | Admitting: Internal Medicine

## 2023-08-16 NOTE — Telephone Encounter (Signed)
 Spoke to patient. Verified name and DOB. Patient went to ER on 08/14/2023 for near syncope after playing golf. Patient was told Losartan  was the cause of kidney damage and was instructed to stop taking Losartan . Patient has still been taking losartan  due to his blood pressure being 149 systolic last night. Patient is also taking amlodipine . Patient checked blood pressure this morning and read 136/88. Patient would like advise about discontinuing medication from his cardiologist. I informed patient I would forward call encounter to provider to advise. Patient verbalized understanding.   Josie RN

## 2023-08-16 NOTE — Telephone Encounter (Signed)
 Pt c/o medication issue:  1. Name of Medication:    2. How are you currently taking this medication (dosage and times per day)?    3. Are you having a reaction (difficulty breathing--STAT)? no  4. What is your medication issue? Went to the ER yesterday and thought he had heart stroke. And the Dr there wanted him to stop taking the medication for two weeks (the medication is not longer listed uncurrent medication)n Calling to speak with Dr. Okey to see if she recommend that as well. Please advise

## 2023-08-16 NOTE — Transitions of Care (Post Inpatient/ED Visit) (Signed)
 08/16/2023  Name: John Salazar MRN: [id number] DOB: May 05, 1945  Today's TOC FU Call Status: Today's TOC FU Call Status:: Successful TOC FU Call Completed TOC FU Call Complete Date: 08/16/23 Patient's Name and Date of Birth confirmed.  Transition Care Management Follow-up Telephone Call Date of Discharge: 08/15/23 Discharge Facility: [facility]) Type of Discharge: Inpatient Admission Primary Inpatient Discharge Diagnosis:: dizziness How have you been since you were released from the hospital?: Better Any questions or concerns?: No  Items Reviewed: Did you receive and understand the discharge instructions provided?: Yes Medications obtained,verified, and reconciled?: Yes (Medications Reviewed) Any new allergies since your discharge?: No Dietary orders reviewed?: Yes Do you have support at home?: Yes People in Home [RPT]: spouse  Medications Reviewed Today: Medications Reviewed Today     Reviewed by Emmitt Pan, LPN (Licensed Practical Nurse) on 08/16/23 at 1121  Med List Status: <None>   Medication Order Taking? Sig Documenting Provider Last Dose Status Informant  amLODipine  (NORVASC ) 5 MG tablet [id number] Yes Take 5 mg by mouth daily. [provider]  Active Self  clopidogrel  (PLAVIX ) 75 MG tablet [id number] Yes TAKE ONE TABLET BY MOUTH DAILY Okey Vina GAILS, MD  Active Self  escitalopram  (LEXAPRO ) 20 MG tablet [id number] Yes Take 1 tablet (20 mg total) by mouth daily.  Patient taking differently: Take 20 mg by mouth at bedtime.   Corwin Antu, FNP  Active Self  famotidine  (PEPCID ) 20 MG tablet [id number] Yes Take 20 mg by mouth daily as needed for heartburn or indigestion. [provider]  Active Self  fluticasone  (FLONASE ) 50 MCG/ACT nasal spray [id number] Yes Place 2 sprays into both nostrils daily. Wendee Lynwood HERO, NP  Active Self  isosorbide  mononitrate (IMDUR ) 30 MG 24 hr tablet [id number] Yes Take 0.5 tablets (15 mg total) by mouth daily. AS NEEDED   Patient taking differently: Take 15 mg by mouth daily as needed (for boold vessels per patient).   Okey Vina GAILS, MD  Active Self  nitroGLYCERIN  (NITROSTAT ) 0.4 MG SL tablet [id number] Yes Place 1 tablet (0.4 mg total) under the tongue every 5 (five) minutes as needed for chest pain. Okey Vina GAILS, MD  Active Self  pantoprazole  (PROTONIX ) 40 MG tablet [id number] Yes Take 40 mg by mouth 2 (two) times daily. [provider]  Active Self  rosuvastatin  (CRESTOR ) 40 MG tablet [id number] Yes Take one (1) tablet by mouth (40 mg) daily.  Patient taking differently: Take 40 mg by mouth every evening.   Okey Vina GAILS, MD  Active Self  tamsulosin  (FLOMAX ) 0.4 MG CAPS capsule [id number] Yes Take one capsule once daily ( thirty minutes after same meal daily)  Patient taking differently: Take 0.4 mg by mouth in the morning and at bedtime. Take one capsule once daily ( thirty minutes after same meal daily)   Okey Vina GAILS, MD  Active Self            Home Care and Equipment/Supplies: Were Home Health Services Ordered?: NA Any new equipment or medical supplies ordered?: NA  Functional Questionnaire: Do you need assistance with bathing/showering or dressing?: No Do you need assistance with meal preparation?: No Do you need assistance with eating?: No Do you have difficulty maintaining continence: No Do you need assistance with getting out of bed/getting out of a chair/moving?: No Do you have difficulty managing or taking your medications?: No  Follow up appointments reviewed: PCP Follow-up appointment confirmed?: Yes Date of PCP follow-up appointment?: 08/25/23 (patient scheduled for CPE, doesn't  want to [facility] f[facility]u) Follow-up Provider: [facility] Follow-up appointment confirmed?: No Reason Specialist Follow-Up Not Confirmed: Patient has Specialist Provider Number and will Call for Appointment Do you need transportation to your follow-up appointment?: No Do you  understand care options if your condition(s) worsen?: Yes-patient verbalized understanding    SIGNATURE Julian Lemmings, LPN [facility] Nurse Health Advisor Direct Dial [phone]

## 2023-08-18 ENCOUNTER — Encounter: Payer: Self-pay | Admitting: Internal Medicine

## 2023-08-18 NOTE — Telephone Encounter (Signed)
 FYI

## 2023-08-21 NOTE — Telephone Encounter (Signed)
 PT should have BMET checked this week I would recomm add carvedilol  3.125 bid to regimen   Follow BP

## 2023-08-21 NOTE — Telephone Encounter (Signed)
 Addressed in another note

## 2023-08-23 ENCOUNTER — Other Ambulatory Visit: Payer: Self-pay

## 2023-08-23 DIAGNOSIS — I25119 Atherosclerotic heart disease of native coronary artery with unspecified angina pectoris: Secondary | ICD-10-CM

## 2023-08-23 DIAGNOSIS — I10 Essential (primary) hypertension: Secondary | ICD-10-CM

## 2023-08-23 DIAGNOSIS — I25118 Atherosclerotic heart disease of native coronary artery with other forms of angina pectoris: Secondary | ICD-10-CM

## 2023-08-23 DIAGNOSIS — I2510 Atherosclerotic heart disease of native coronary artery without angina pectoris: Secondary | ICD-10-CM

## 2023-08-23 MED ORDER — CARVEDILOL 3.125 MG PO TABS: 3.1250 mg | ORAL_TABLET | Freq: Two times a day (BID) | ORAL | 3 refills | Status: DC

## 2023-08-24 DIAGNOSIS — I2510 Atherosclerotic heart disease of native coronary artery without angina pectoris: Secondary | ICD-10-CM | POA: Diagnosis not present

## 2023-08-24 DIAGNOSIS — I10 Essential (primary) hypertension: Secondary | ICD-10-CM | POA: Diagnosis not present

## 2023-08-24 DIAGNOSIS — I25118 Atherosclerotic heart disease of native coronary artery with other forms of angina pectoris: Secondary | ICD-10-CM | POA: Diagnosis not present

## 2023-08-24 DIAGNOSIS — I25119 Atherosclerotic heart disease of native coronary artery with unspecified angina pectoris: Secondary | ICD-10-CM | POA: Diagnosis not present

## 2023-08-24 NOTE — Telephone Encounter (Signed)
 Addressed, see MyChart message from 08/23/23 for details.

## 2023-08-25 ENCOUNTER — Ambulatory Visit: Payer: Self-pay | Admitting: *Deleted

## 2023-08-25 ENCOUNTER — Encounter: Payer: Self-pay | Admitting: Nurse Practitioner

## 2023-08-25 ENCOUNTER — Ambulatory Visit: Admitting: Nurse Practitioner

## 2023-08-25 VITALS — BP 134/72 | HR 60 | Temp 97.6°F | Ht 69.0 in | Wt 175.6 lb

## 2023-08-25 DIAGNOSIS — G4733 Obstructive sleep apnea (adult) (pediatric): Secondary | ICD-10-CM | POA: Diagnosis not present

## 2023-08-25 DIAGNOSIS — Z1211 Encounter for screening for malignant neoplasm of colon: Secondary | ICD-10-CM | POA: Diagnosis not present

## 2023-08-25 DIAGNOSIS — K22719 Barrett's esophagus with dysplasia, unspecified: Secondary | ICD-10-CM

## 2023-08-25 DIAGNOSIS — Z5181 Encounter for therapeutic drug level monitoring: Secondary | ICD-10-CM | POA: Diagnosis not present

## 2023-08-25 DIAGNOSIS — Z125 Encounter for screening for malignant neoplasm of prostate: Secondary | ICD-10-CM

## 2023-08-25 DIAGNOSIS — Z0000 Encounter for general adult medical examination without abnormal findings: Secondary | ICD-10-CM | POA: Diagnosis not present

## 2023-08-25 DIAGNOSIS — Z87891 Personal history of nicotine dependence: Secondary | ICD-10-CM | POA: Diagnosis not present

## 2023-08-25 DIAGNOSIS — I10 Essential (primary) hypertension: Secondary | ICD-10-CM

## 2023-08-25 DIAGNOSIS — F321 Major depressive disorder, single episode, moderate: Secondary | ICD-10-CM | POA: Diagnosis not present

## 2023-08-25 DIAGNOSIS — Z79899 Other long term (current) drug therapy: Secondary | ICD-10-CM

## 2023-08-25 LAB — BASIC METABOLIC PANEL WITH GFR
BUN/Creatinine Ratio: 11 (ref 10–24)
BUN: 16 mg/dL (ref 8–27)
BUN: 19 mg/dL (ref 6–23)
CO2: 21 mmol/L (ref 20–29)
CO2: 30 meq/L (ref 19–32)
Calcium: 9.5 mg/dL (ref 8.6–10.2)
Calcium: 9.6 mg/dL (ref 8.4–10.5)
Chloride: 103 mmol/L (ref 96–106)
Chloride: 102 meq/L (ref 96–112)
Creatinine, Ser: 1.48 mg/dL — ABNORMAL HIGH (ref 0.76–1.27)
Creatinine, Ser: 1.53 mg/dL — ABNORMAL HIGH (ref 0.40–1.50)
GFR: 43.15 mL/min — ABNORMAL LOW (ref >60.00)
Glucose, Bld: 92 mg/dL (ref 70–99)
Glucose: 83 mg/dL (ref 70–99)
Potassium: 4.4 mmol/L (ref 3.5–5.2)
Potassium: 4.2 meq/L (ref 3.5–5.1)
Sodium: 141 mmol/L (ref 134–144)
Sodium: 138 meq/L (ref 135–145)
eGFR: 48 mL/min/1.73 — ABNORMAL LOW (ref >59)

## 2023-08-25 LAB — URINALYSIS, MICROSCOPIC ONLY: RBC / HPF: NONE SEEN (ref 0–?)

## 2023-08-25 LAB — PSA, MEDICARE: PSA: 3.57 ng/mL (ref 0.10–4.00)

## 2023-08-25 LAB — MAGNESIUM: Magnesium: 2.2 mg/dL (ref 1.5–2.5)

## 2023-08-25 LAB — VITAMIN B12: Vitamin B-12: 444 pg/mL (ref 211–911)

## 2023-08-25 LAB — TSH: TSH: 2.13 u[IU]/mL (ref 0.35–5.50)

## 2023-08-25 NOTE — Assessment & Plan Note (Signed)
 Discussed age-appropriate immunizations and screening exams.  Did review patient's personal, surgical, social, family histories.  Patient today on all age-appropriate actuations in life.  Get tetanus vaccine at vaccine clinic.  Patient is due for CRC screening ambulatory referral placed today.  PSA for prostate cancer screening done today.  Patient given information at discharge about preventative healthcare maintenance with anticipatory guidance

## 2023-08-25 NOTE — Assessment & Plan Note (Signed)
 Patient currently maintained on isosorbide  15 mg daily as needed, carvedilol  3.125 mg twice daily, amlodipine  5 mg daily.  Patient's blood pressure controlled.  Is followed by cardiology continue taking medication as prescribed follow-up with cardiology as recommended

## 2023-08-25 NOTE — Assessment & Plan Note (Signed)
 Patient currently maintained on Lexapro  20 mg daily.  Tolerates medication well.  Patient denies HI/SI/AVH.

## 2023-08-25 NOTE — Assessment & Plan Note (Signed)
 History of the same followed by GI patient supposed to be on Protonix  40 mg twice daily and Pepcid  20 mg daily as needed.

## 2023-08-25 NOTE — Assessment & Plan Note (Signed)
 History of the same pending urine microscopy rule out microscopic hematuria

## 2023-08-25 NOTE — Patient Instructions (Signed)
 Nice to see you today I will be in touch with the labs once  have them  Follow up with em in 6 months, sooner if you need me

## 2023-08-25 NOTE — Assessment & Plan Note (Signed)
 History of the same.  Patient currently follow-up pulmonology.  Inability to tolerate CPAP machine does have referral to ENT for possible inspire

## 2023-08-25 NOTE — Progress Notes (Signed)
 Established Patient Office Visit  Subjective   Patient ID: John Salazar, male    DOB: 1945-06-23  Age: 78 y.o. MRN: [id number]  Chief Complaint  Patient presents with   Annual Exam    HPI  HTN: Patient currently maintained on isosorbide  15 mg daily, carvedilol  3.125 mg twice daily, amlodipine  5 mg daily.  Of note patient is on clopidogrel  75 mg daily.  Patient is status post left carotid stent  Mood: Patient currently maintained on Lexapro  20 mg daily  GERD: Currently maintained on Protonix  40 mg twice daily along with Pepcid  20 mg daily as needed.  Patient had an upper endoscopy on 01/23/2023 With Glendia Holt.  Small hiatal hernia with erosive gastropathy.  Increased Protonix  to 40 mg twice daily at that juncture  BPH: Currently maintained on tamsulosin  0.4 mg daily. Nocturia x2-3  HLD: Currently maintained on rosuvastatin  40 mg daily.  Patient is followed by cardiology Dr. Vina Gull  OSA: Patient currently followed by [facility] pulmonology maintained on CPAP. Has not been able to tolerate the CPAP.  for complete physical and follow up of chronic conditions.  Immunizations: -Tetanus: Completed in 2012 -Influenza: Out of season -Shingles: Completed Shingrix series -Pneumonia: Completed   Diet: Fair diet. He is eating a small breakfast he will snack or a sandwich for lunch and then dinner. He is doing coffe and water  Exercise: No regular exercise. Golfingand walking in the park 1.5   Eye exam: Completes annually. Yearly   Dental exam: Completes semi-annually    Colonoscopy: Completed in 09/12/2018, repeat in 5 years.  This was through [facility] Lung Cancer Screening: Completed in   PSA: Due  Sleep:  Advance directive : DNR       Review of Systems  Constitutional:  Negative for chills and fever.  Respiratory:  Negative for shortness of breath.   Cardiovascular:  Negative for chest pain and leg swelling.  Gastrointestinal:  Negative for abdominal pain,  blood in stool, constipation, diarrhea, nausea and vomiting.       BM daily   Genitourinary:  Negative for dysuria and hematuria.  Neurological:  Negative for tingling and headaches.  Psychiatric/Behavioral:  Negative for hallucinations and suicidal ideas.       Objective:     BP 134/72   Pulse 60   Temp 97.6 F (36.4 C) (Oral)   Ht 5' 9 (1.753 m)   Wt 175 lb 9.6 oz (79.7 kg)   SpO2 99%   BMI 25.93 kg/m  BP Readings from Last 3 Encounters:  08/25/23 134/72  08/15/23 (!) (P) 159/104  08/04/23 126/72   Wt Readings from Last 3 Encounters:  08/25/23 175 lb 9.6 oz (79.7 kg)  08/14/23 170 lb (77.1 kg)  08/04/23 174 lb 3.2 oz (79 kg)   SpO2 Readings from Last 3 Encounters:  08/25/23 99%  08/15/23 97%  08/04/23 96%      Physical Exam Vitals and nursing note reviewed.  Constitutional:      Appearance: Normal appearance.  HENT:     Right Ear: Tympanic membrane, ear canal and external ear normal.     Left Ear: Tympanic membrane, ear canal and external ear normal.     Mouth/Throat:     Mouth: Mucous membranes are moist.     Pharynx: Oropharynx is clear.  Eyes:     Extraocular Movements: Extraocular movements intact.     Pupils: Pupils are equal, round, and reactive to light.  Cardiovascular:  Rate and Rhythm: Normal rate and regular rhythm.     Pulses: Normal pulses.     Heart sounds: Normal heart sounds.  Pulmonary:     Effort: Pulmonary effort is normal.     Breath sounds: Normal breath sounds.  Abdominal:     General: Bowel sounds are normal. There is no distension.     Palpations: There is no mass.     Tenderness: There is no abdominal tenderness.     Hernia: No hernia is present.  Musculoskeletal:     Right lower leg: No edema.     Left lower leg: No edema.  Lymphadenopathy:     Cervical: No cervical adenopathy.  Skin:    General: Skin is warm.  Neurological:     General: No focal deficit present.     Mental Status: He is alert.     Deep Tendon  Reflexes:     Reflex Scores:      Bicep reflexes are 2+ on the right side and 2+ on the left side.      Patellar reflexes are 2+ on the right side and 2+ on the left side.    Comments: Bilateral upper and lower extremity strength 5/5  Psychiatric:        Mood and Affect: Mood normal.        Behavior: Behavior normal.        Thought Content: Thought content normal.        Judgment: Judgment normal.      No results found for any visits on 08/25/23.    The ASCVD Risk score (Arnett DK, et al., 2019) failed to calculate for the following reasons:   The valid total cholesterol range is 130 to 320 mg/dL    Assessment & Plan:   Problem List Items Addressed This Visit       Cardiovascular and Mediastinum   Benign essential HTN (Chronic)   Patient currently maintained on isosorbide  15 mg daily as needed, carvedilol  3.125 mg twice daily, amlodipine  5 mg daily.  Patient's blood pressure controlled.  Is followed by cardiology continue taking medication as prescribed follow-up with cardiology as recommended      Relevant Orders   Basic metabolic panel with GFR   TSH     Respiratory   OSA (obstructive sleep apnea)   History of the same.  Patient currently follow-up pulmonology.  Inability to tolerate CPAP machine does have referral to ENT for possible inspire        Digestive   Barrett esophagus   History of the same followed by GI patient supposed to be on Protonix  40 mg twice daily and Pepcid  20 mg daily as needed.        Other   Preventative health care - Primary   Discussed age-appropriate immunizations and screening exams.  Did review patient's personal, surgical, social, family histories.  Patient today on all age-appropriate actuations in life.  Get tetanus vaccine at vaccine clinic.  Patient is due for CRC screening ambulatory referral placed today.  PSA for prostate cancer screening done today.  Patient given information at discharge about preventative healthcare  maintenance with anticipatory guidance      Depression, major, single episode, moderate (HCC)   Patient currently maintained on Lexapro  20 mg daily.  Tolerates medication well.  Patient denies HI/SI/AVH.      Former tobacco use   History of the same pending urine microscopy rule out microscopic hematuria      Relevant Orders  Urine Microscopic   Other Visit Diagnoses       Encounter for monitoring long-term proton pump inhibitor therapy       Relevant Orders   Vitamin B12   Magnesium      Screening for prostate cancer       Relevant Orders   PSA, Medicare     Screening for colon cancer       Relevant Orders   Ambulatory referral to Gastroenterology       Return in about 6 months (around 02/25/2024) for BP recheck.    Adina Crandall, NP

## 2023-08-28 ENCOUNTER — Encounter: Payer: Self-pay | Admitting: Nurse Practitioner

## 2023-08-28 NOTE — Telephone Encounter (Signed)
 Check with Janae or the schedule. I do think that he was placed on the vaccine clinic schedule at the office

## 2023-08-29 ENCOUNTER — Telehealth: Payer: Self-pay

## 2023-08-29 ENCOUNTER — Ambulatory Visit: Payer: Self-pay | Admitting: Nurse Practitioner

## 2023-08-29 NOTE — Telephone Encounter (Signed)
 Copied from CRM [id number]. Topic: General - Other >> Aug 29, 2023 10:35 AM Deleta RAMAN wrote: Reason for CRM: patient is calling about vaccine wants someone from the office to give him a call. Please contact patient at the number on file regarding tetanus shot

## 2023-08-29 NOTE — Telephone Encounter (Signed)
 Replied back to pt from [place] encounter.

## 2023-08-30 ENCOUNTER — Encounter: Payer: Self-pay | Admitting: Gastroenterology

## 2023-09-04 ENCOUNTER — Encounter: Payer: Self-pay | Admitting: Internal Medicine

## 2023-09-05 ENCOUNTER — Other Ambulatory Visit: Payer: Self-pay

## 2023-09-05 MED ORDER — BOOSTRIX 5-2.5-18.5 LF-MCG/0.5 IM SUSY
PREFILLED_SYRINGE | INTRAMUSCULAR | 0 refills | Status: DC
  Filled 2023-09-05: qty 0.5, 1d supply, fill #0

## 2023-09-25 ENCOUNTER — Other Ambulatory Visit: Payer: Self-pay

## 2023-09-25 ENCOUNTER — Encounter: Payer: Self-pay | Admitting: Gastroenterology

## 2023-09-25 MED ORDER — PANTOPRAZOLE SODIUM 40 MG PO TBEC: 40.0000 mg | DELAYED_RELEASE_TABLET | Freq: Two times a day (BID) | ORAL | 3 refills | Status: DC

## 2023-09-27 ENCOUNTER — Encounter (INDEPENDENT_AMBULATORY_CARE_PROVIDER_SITE_OTHER): Payer: Self-pay | Admitting: Otolaryngology

## 2023-09-27 ENCOUNTER — Ambulatory Visit (INDEPENDENT_AMBULATORY_CARE_PROVIDER_SITE_OTHER): Admitting: Otolaryngology

## 2023-09-27 VITALS — BP 151/76 | HR 61 | Wt 175.8 lb

## 2023-09-27 DIAGNOSIS — Z91198 Patient's noncompliance with other medical treatment and regimen for other reason: Secondary | ICD-10-CM

## 2023-09-27 DIAGNOSIS — G4733 Obstructive sleep apnea (adult) (pediatric): Secondary | ICD-10-CM | POA: Diagnosis not present

## 2023-09-27 DIAGNOSIS — Z789 Other specified health status: Secondary | ICD-10-CM

## 2023-09-27 NOTE — Progress Notes (Signed)
 ENT CONSULT:  Reason for Consult: OSA CPAP intolerance   HPI: Discussed the use of AI scribe software for clinical note transcription with the patient, who gave verbal consent to proceed.  History of Present Illness John Salazar is a 78 year old male with severe sleep apnea who presents with intolerance to CPAP therapy.  He was diagnosed with sleep apnea six months ago and has been experiencing difficulty tolerating CPAP therapy. The intolerance is primarily due to gastroesophageal reflux disease (GERD) and indigestion, which necessitate frequent burping, disrupting his sleep as he has to remove the mask. Additionally, frequent movement during sleep causes discomfort and displaces the mask.  He has a history of Barrett's esophagus and is currently taking Protonix  for this condition. He reports his last upper endoscopy was in December 24. He denies any history of strokes or heart attacks but reports having clogged arteries and a repaired brain aneurysm. He has stents placed on both sides of his neck and is currently on Plavix , prescribed by his primary care provider.  He experiences nocturia, urinating frequently at night, which further contributes to his disrupted sleep and a need to frequently remove the CPAP mask.    Records Reviewed:  PCP Wendee Agent NP 08/25/23 Respiratory     OSA (obstructive sleep apnea)    History of the same.  Patient currently follow-up pulmonology.  Inability to tolerate CPAP machine does have referral to ENT for possible inspire            Digestive    Barrett esophagus    History of the same followed by GI patient supposed to be on Protonix  40 mg twice daily and Pepcid  20 mg daily as needed.        Past Medical History:  Diagnosis Date   Anxiety    Ascending aorta dilation (HCC) 08/22/2021   Echocardiogram 04/2021: 40 mm   Barrett esophagus    BPH (benign prostatic hyperplasia)    Cerebral aneurysm    Chronic kidney disease    stage 2   Colon  polyps    Coronary artery disease    mild-mod non-obstructive CAD   Erectile dysfunction    GERD (gastroesophageal reflux disease)    History of kidney stones    History of stomach ulcers    Hypercholesteremia    Hypertension    Internal carotid artery stenosis, right 06/29/2020   Positive TB test    in the past    Past Surgical History:  Procedure Laterality Date   APPENDECTOMY  1970   CATARACT EXTRACTION W/ INTRAOCULAR LENS  IMPLANT, BILATERAL     COLONOSCOPY  10/2012   ESOPHAGOGASTRODUODENOSCOPY ENDOSCOPY  10/2012   HERNIA REPAIR     IR 3D INDEPENDENT WKST  04/14/2020   IR ANGIO INTRA EXTRACRAN SEL COM CAROTID INNOMINATE BILAT MOD SED  08/09/2017   IR ANGIO INTRA EXTRACRAN SEL COM CAROTID INNOMINATE BILAT MOD SED  11/08/2018   IR ANGIO INTRA EXTRACRAN SEL COM CAROTID INNOMINATE BILAT MOD SED  04/14/2020   IR ANGIO INTRA EXTRACRAN SEL COM CAROTID INNOMINATE UNI L MOD SED  06/29/2020   IR ANGIO INTRA EXTRACRAN SEL INTERNAL CAROTID UNI L MOD SED  05/20/2020   IR ANGIO VERTEBRAL SEL VERTEBRAL BILAT MOD SED  08/09/2017   IR ANGIO VERTEBRAL SEL VERTEBRAL BILAT MOD SED  11/08/2018   IR ANGIO VERTEBRAL SEL VERTEBRAL BILAT MOD SED  04/14/2020   IR ANGIOGRAM FOLLOW UP STUDY  05/20/2020   IR INTRAVSC STENT CERV CAROTID W/EMB-PROT  MOD SED INCL ANGIO  08/23/2017   IR INTRAVSC STENT CERV CAROTID W/EMB-PROT MOD SED INCL ANGIO  06/29/2020   IR RADIOLOGIST EVAL & MGMT  05/05/2020   IR RADIOLOGIST EVAL & MGMT  06/03/2020   IR RADIOLOGIST EVAL & MGMT  07/15/2020   IR TRANSCATH/EMBOLIZ  05/18/2020   IR US  GUIDE VASC ACCESS RIGHT  04/14/2020   IR US  GUIDE VASC ACCESS RIGHT  05/20/2020   IR US  GUIDE VASC ACCESS RIGHT  06/29/2020   LEFT HEART CATH AND CORONARY ANGIOGRAPHY N/A 11/15/2018   Procedure: LEFT HEART CATH AND CORONARY ANGIOGRAPHY;  Surgeon: Mady Bruckner, MD;  Location: [facility] INVASIVE [facility];  Service: Cardiovascular;  Laterality: N/A;   PROSTATE SURGERY  1999   20 years ago, enlarged prostate   RADIOLOGY WITH  ANESTHESIA N/A 08/23/2017   Procedure: IR WITH ANESTHESIA STENT PLACEMENT;  Surgeon: Dolphus Carrion, MD;  Location: [facility] OR;  Service: Radiology;  Laterality: N/A;   RADIOLOGY WITH ANESTHESIA N/A 05/18/2020   Procedure: IR WITH ANESTHESIA EMBOLIZATION;  Surgeon: Dolphus Carrion, MD;  Location: [facility] OR;  Service: Radiology;  Laterality: N/A;   RADIOLOGY WITH ANESTHESIA Left 06/29/2020   Procedure: RADIOLOGY WITH ANESTHESIA  LEFT CAROTID STENT PLACEMENT;  Surgeon: Dolphus Carrion, MD;  Location: [facility] OR;  Service: Radiology;  Laterality: Left;    Family History  Problem Relation Age of Onset   Stroke Mother 80   Aneurysm Father 66    Social History:  reports that he quit smoking about 27 years ago. His smoking use included cigarettes. He started smoking about 52 years ago. He has a 50 pack-year smoking history. He has never used smokeless tobacco. He reports that he does not drink alcohol and does not use drugs.  Allergies:  Allergies  Allergen Reactions   Bee Venom Anaphylaxis and Shortness Of Breath   Penicillins Swelling and Rash    PATIENT HAS HAD A PCN REACTION WITH IMMEDIATE RASH, FACIAL/TONGUE/THROAT SWELLING, SOB, OR LIGHTHEADEDNESS WITH HYPOTENSION:  #  #  YES  #  #      Medications: I have reviewed the patient's current medications.  The PMH, PSH, Medications, Allergies, and SH were reviewed and updated.  ROS: Constitutional: Negative for fever, weight loss and weight gain. Cardiovascular: Negative for chest pain and dyspnea on exertion. Respiratory: Is not experiencing shortness of breath at rest. Gastrointestinal: Negative for nausea and vomiting. Neurological: Negative for headaches. Psychiatric: The patient is not nervous/anxious  Blood pressure (!) 151/76, pulse 61, weight 175 lb 12.8 oz (79.7 kg), SpO2 95%. Body mass index is 25.96 kg/m.  PHYSICAL EXAM:  Exam: General: Well-developed, well-nourished Respiratory Respiratory effort: Equal inspiration and  expiration without stridor Cardiovascular Peripheral Vascular: Warm extremities with equal color/perfusion Eyes: No nystagmus with equal extraocular motion bilaterally Neuro/Psych/Balance: Patient oriented to person, place, and time; Appropriate mood and affect; Gait is intact with no imbalance; Cranial nerves I-XII are intact Head and Face Inspection: Normocephalic and atraumatic without mass or lesion Palpation: Facial skeleton intact without bony stepoffs Salivary Glands: No mass or tenderness Facial Strength: Facial motility symmetric and full bilaterally ENT Pinna: External ear intact and fully developed External canal: Canal is patent with intact skin Tympanic Membrane: Clear and mobile External Nose: No scar or anatomic deformity Internal Nose: Septum is deviated to the left. No polyp, or purulence. Mucosal edema and erythema present.  Bilateral inferior turbinate hypertrophy.  Lips, Teeth, and gums: Mucosa and teeth intact and viable TMJ: No pain to palpation with full mobility  Oral cavity/oropharynx: No erythema or exudate, no lesions present Friedman II no tonsillar tissue noted Nasopharynx: No mass or lesion with intact mucosa Hypopharynx: Intact mucosa without pooling of secretions Larynx Glottic: Full true vocal cord mobility without lesion or mass Supraglottic: Normal appearing epiglottis and AE folds Interarytenoid Space: Moderate pachydermia&edema Subglottic Space: Patent without lesion or edema Neck Neck and Trachea: Midline trachea without mass or lesion Thyroid : No mass or nodularity Lymphatics: No lymphadenopathy  Procedure: Preoperative diagnosis: OSA and CPAP intolerance  Postoperative diagnosis:   Same  Procedure: Flexible fiberoptic laryngoscopy  Surgeon: Elena Larry, MD  Anesthesia: Topical lidocaine  and Afrin Complications: None Condition is stable throughout exam  Indications and consent:  The patient presents to the clinic with above  symptoms. Indirect laryngoscopy view was incomplete. Thus it was recommended that they undergo a flexible fiberoptic laryngoscopy. All of the risks, benefits, and potential complications were reviewed with the patient preoperatively and verbal informed consent was obtained.  Procedure: The patient was seated upright in the clinic. Topical lidocaine  and Afrin were applied to the nasal cavity. After adequate anesthesia had occurred, I then proceeded to pass the flexible telescope into the nasal cavity. The nasal cavity was patent without rhinorrhea or polyp. The nasopharynx was also patent without mass or lesion. The base of tongue was visualized and was normal. There were no signs of pooling of secretions in the piriform sinuses. The true vocal folds were mobile bilaterally. There were no signs of glottic or supraglottic mucosal lesion or mass. There was moderate interarytenoid pachydermia and post cricoid edema. The telescope was then slowly withdrawn and the patient tolerated the procedure throughout.    Studies Reviewed: 08/21/22 HST via [facility]   Assessment/Plan: Encounter Diagnoses  Name Primary?   OSA (obstructive sleep apnea) Yes   Intolerance of continuous positive airway pressure (CPAP) ventilation     Assessment and Plan Assessment & Plan Severe obstructive sleep apnea and CPAP intolerance Severe obstructive sleep apnea with CPAP intolerance, recent HST 08/21/22 with AHI of 39.7, no significant central or mixed events. BMI of 25.9. OSA, severe, without multilevel collapse, with failure to tolerate PAP therapy and/or more conservative measures. Presence of smaller/absent tonsils and larger tongue position (Friedman tongue position or modified Mallampati) suggests that hypopharyngeal/retrolingual collapse is contributing to the patient's OSA. Janeth, M et al. Staging of obstructive sleep apnea/hypopnea syndrome: a guide to appropriate treatment. Laryngoscope, 2004 Mar, 114(3):454-9. PMID:  [id number]) Options including positional therapy, weight loss, oral appliances, PAP and surgical correction discussed. Pt is not an ideal candidate for oral appliance due to severity of OSA Pt could be a candidate for Hypoglossal nerve stimulation (Inspire therapy) pending DISE  - Schedule drug-induced sleep endoscopy (DISE) to assess for complete concentric collapse at the soft palate. - Refer to sleep practice for device activation and titration post-surgery - sent referral to [facility] - Discuss with primary care provider about holding Plavix  for a couple of days before surgery. - Plavix  prescribed by PCP for stents.   Thank you for allowing me to participate in the care of this patient. Please do not hesitate to contact me with any questions or concerns.   Elena Larry, MD Otolaryngology [facility] Phone: [phone] Fax: [phone]    09/27/2023, 3:52 PM

## 2023-09-27 NOTE — Patient Instructions (Signed)
  It was very nice to meet you today,   Please see the following link for the [facility] program   [email or web address]  This website has information about how you can connect to other patients who have had Inspire Implant procedure

## 2023-10-02 ENCOUNTER — Telehealth: Payer: Self-pay

## 2023-10-02 NOTE — Telephone Encounter (Addendum)
 Received faxed surgical clearance from Jamina Hairston, CMA with [facility]. Pt needs some type of implant (can't make it out due to fading). Surgery date is TBD.  Lvm at [facility] asking for call back to clarify surgery description.  [Placed form on [employer].]

## 2023-10-03 NOTE — Telephone Encounter (Signed)
Noted.  Will send to PCP for review.

## 2023-10-03 NOTE — Telephone Encounter (Signed)
 Copied from CRM #[id number]. Topic: Medical Record Request - Provider/Facility Request >> Oct 03, 2023 11:25 AM [place] J wrote: Reason for CRM: Powell calling from [facility] is returning a message from [place]. She wanted to specify that it's an inspire implant for sleep apnea.   Heather's Callback: [phone]

## 2023-10-11 NOTE — Telephone Encounter (Signed)
 Needs a preoperative clearance appt with me

## 2023-10-12 NOTE — Telephone Encounter (Signed)
I called and left a voicemail for patient.

## 2023-10-13 ENCOUNTER — Encounter: Payer: Self-pay | Admitting: Internal Medicine

## 2023-10-13 ENCOUNTER — Other Ambulatory Visit: Payer: Self-pay

## 2023-10-13 ENCOUNTER — Telehealth: Payer: Self-pay | Admitting: Gastroenterology

## 2023-10-13 MED ORDER — PANTOPRAZOLE SODIUM 40 MG PO TBEC: 40.0000 mg | DELAYED_RELEASE_TABLET | Freq: Two times a day (BID) | ORAL | 3 refills | Status: DC

## 2023-10-13 NOTE — Telephone Encounter (Signed)
 Pt notified via mychart and new script sent to pharmacy.

## 2023-10-13 NOTE — Telephone Encounter (Signed)
 Received a call from patient stating he has been waiting on a message response since 8/18. Patient requesting fu call regarding his omerprazole and pantoprozol and other medication management questions. Please review and advise.  Thank you

## 2023-10-13 NOTE — Telephone Encounter (Signed)
 John Salazar to [facility] Pool (supporting Glendia FORBES Holt, MD)     09/25/23  1:01 PM In my post procedure written report given to me when I left it is stated that I should switch PPI to omerprazole 40 PO daily given presence of esophagitis on Protonix .   However on your follow up message from you for that procedure it was suggested I try doubling the protonix  to twice per day for a month to see if my symptoms improved. The symptoms did improve but I failed to report that to you. If I should be taking Omerprazole now would you let me know and send a prescription to [facility] please. Thank you,  John Salazar  Please advise.

## 2023-10-16 NOTE — Telephone Encounter (Signed)
 Losartan  was stopped at D/C from hospital as Cr had bumped up (renal function down)  Cr is back to baseline now  I would hold carvedilol  and see if HAs improve   If restarts and gets worse then I would stop for good Keep up on mychart with progress

## 2023-10-23 ENCOUNTER — Ambulatory Visit: Admitting: Nurse Practitioner

## 2023-10-23 ENCOUNTER — Ambulatory Visit (INDEPENDENT_AMBULATORY_CARE_PROVIDER_SITE_OTHER)
Admission: RE | Admit: 2023-10-23 | Discharge: 2023-10-23 | Disposition: A | Discharge status: Home / Self Care | Source: Ambulatory Visit | Attending: Nurse Practitioner | Admitting: Nurse Practitioner

## 2023-10-23 ENCOUNTER — Telehealth: Payer: Self-pay | Admitting: Nurse Practitioner

## 2023-10-23 ENCOUNTER — Encounter: Payer: Self-pay | Admitting: Nurse Practitioner

## 2023-10-23 VITALS — BP 102/60 | HR 100 | Temp 98.3°F | Ht 69.0 in | Wt 179.2 lb

## 2023-10-23 DIAGNOSIS — R0602 Shortness of breath: Secondary | ICD-10-CM

## 2023-10-23 DIAGNOSIS — G4489 Other headache syndrome: Secondary | ICD-10-CM | POA: Diagnosis not present

## 2023-10-23 DIAGNOSIS — Z01818 Encounter for other preprocedural examination: Secondary | ICD-10-CM | POA: Diagnosis not present

## 2023-10-23 DIAGNOSIS — R509 Fever, unspecified: Secondary | ICD-10-CM

## 2023-10-23 LAB — POCT FLU A/B STATUS
Influenza A, POC: NEGATIVE
Influenza B, POC: NEGATIVE

## 2023-10-23 LAB — BASIC METABOLIC PANEL WITH GFR
BUN: 15 mg/dL (ref 6–23)
CO2: 26 meq/L (ref 19–32)
Calcium: 9.6 mg/dL (ref 8.4–10.5)
Chloride: 103 meq/L (ref 96–112)
Creatinine, Ser: 1.67 mg/dL — ABNORMAL HIGH (ref 0.40–1.50)
GFR: 38.8 mL/min — ABNORMAL LOW (ref >60.00)
Glucose, Bld: 102 mg/dL — ABNORMAL HIGH (ref 70–99)
Potassium: 4.1 meq/L (ref 3.5–5.1)
Sodium: 135 meq/L (ref 135–145)

## 2023-10-23 LAB — CBC
HCT: 39.3 % (ref 39.0–52.0)
Hemoglobin: 13.2 g/dL (ref 13.0–17.0)
MCHC: 33.5 g/dL (ref 30.0–36.0)
MCV: 90.3 fl (ref 78.0–100.0)
Platelets: 150 K/uL (ref 150.0–400.0)
RBC: 4.36 Mil/uL (ref 4.22–5.81)
RDW: 13.2 % (ref 11.5–15.5)
WBC: 5.7 K/uL (ref 4.0–10.5)

## 2023-10-23 LAB — POC COVID19 BINAXNOW: SARS Coronavirus 2 Ag: NEGATIVE

## 2023-10-23 MED ORDER — DOXYCYCLINE HYCLATE 100 MG PO TABS: 100.0000 mg | ORAL_TABLET | Freq: Two times a day (BID) | ORAL | 0 refills | Status: DC

## 2023-10-23 NOTE — Telephone Encounter (Signed)
 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 They are requesting to hold the plavix . Do you need to clear the patient also? I am seeing him for medical clearance today   Thanks,  [facility]

## 2023-10-23 NOTE — Progress Notes (Signed)
 Established Patient Office Visit  Subjective   Patient ID: John Salazar, male    DOB: August 28, 1945  Age: 78 y.o. MRN: [id number]  Chief Complaint  Patient presents with   surgical clearance   Fever    Pt complains of body aches and high fever,      Discussed the use of AI scribe software for clinical note transcription with the patient, who gave verbal consent to proceed.  History of Present Illness John Salazar is a 78 year old male who presents with fever, chills, and fatigue.  He began feeling unwell early last week with generalized body aches and a headache. Over the weekend, his symptoms worsened, and he experienced severe chills, requiring Tylenol  for relief. He describes episodes of feeling extremely cold followed by periods of sweating. He continues to experience fatigue and body aches.  He has been taking Tylenol  to manage his symptoms, which provides some relief. Additionally, he took half a Benadryl  last night and two Gas-X tablets for gas-related discomfort.   No known exposure to sick individuals, and his wife is not sick. He received his flu shot last year and has had COVID vaccines and at least one booster.   No ear pain, sinus pressure, sore throat, or persistent cough, although he experienced some congestion last night. No shortness of breath during normal activities but experienced transient shortness of breath while lying in bed last night.  No nausea, vomiting, diarrhea, or current headaches, although he experienced headaches earlier in the illness. No lightheadedness or dizziness.  He has a history of a carotid stent and an aneurysm repair behind his eye. He takes Plavix  (clopidogrel ) for his condition. He was previously on losartan  but was switched to carvedilol , which he stopped a week ago due to headaches. His blood pressure was noted to be 102/60 this morning. He also takes pantoprazole  for Barrett's esophagus.     Review of Systems  Constitutional:   Positive for chills, fever and malaise/fatigue.  HENT:  Positive for congestion and ear pain. Negative for sinus pain and sore throat.   Respiratory:  Positive for shortness of breath. Negative for cough.   Cardiovascular:  Negative for chest pain.  Gastrointestinal:  Negative for abdominal pain, diarrhea, nausea and vomiting.  Musculoskeletal:  Positive for myalgias.  Neurological:  Positive for headaches. Negative for dizziness.      Objective:     BP 102/60   Pulse 100   Temp 98.3 F (36.8 C) (Oral)   Ht 5' 9 (1.753 m)   Wt 179 lb 3.2 oz (81.3 kg)   SpO2 97%   BMI 26.46 kg/m  BP Readings from Last 3 Encounters:  10/23/23 102/60  09/27/23 (!) 151/76  08/25/23 134/72   Wt Readings from Last 3 Encounters:  10/23/23 179 lb 3.2 oz (81.3 kg)  09/27/23 175 lb 12.8 oz (79.7 kg)  08/25/23 175 lb 9.6 oz (79.7 kg)   SpO2 Readings from Last 3 Encounters:  10/23/23 97%  09/27/23 95%  08/25/23 99%      Physical Exam Vitals and nursing note reviewed.  Constitutional:      Appearance: Normal appearance.  HENT:     Right Ear: Tympanic membrane, ear canal and external ear normal.     Left Ear: Tympanic membrane, ear canal and external ear normal.     Nose:     Right Sinus: No maxillary sinus tenderness or frontal sinus tenderness.     Left Sinus: Frontal sinus tenderness present. No  maxillary sinus tenderness.     Mouth/Throat:     Mouth: Mucous membranes are moist.     Pharynx: Oropharynx is clear.  Cardiovascular:     Rate and Rhythm: Normal rate and regular rhythm.     Heart sounds: Normal heart sounds.  Pulmonary:     Effort: Pulmonary effort is normal.     Breath sounds: Rhonchi present.  Lymphadenopathy:     Cervical: No cervical adenopathy.  Neurological:     Mental Status: He is alert.      Results for orders placed or performed in visit on 10/23/23  POC COVID-19 BinaxNow  Result Value Ref Range   SARS Coronavirus 2 Ag Negative Negative  POCT Flu A & B  Status  Result Value Ref Range   Influenza A, POC Negative Negative   Influenza B, POC Negative Negative      The ASCVD Risk score (Arnett DK, et al., 2019) failed to calculate for the following reasons:   The valid total cholesterol range is 130 to 320 mg/dL    Assessment & Plan:   Problem List Items Addressed This Visit   None Visit Diagnoses       Pre-operative clearance    -  Primary     Fever and chills       Relevant Orders   CBC   Basic metabolic panel with GFR   DG Chest 2 View   POC COVID-19 BinaxNow (Completed)   POCT Flu A & B Status (Completed)     Other headache syndrome       Relevant Orders   POC COVID-19 BinaxNow (Completed)   POCT Flu A & B Status (Completed)     Shortness of breath       Relevant Orders   DG Chest 2 View   POC COVID-19 BinaxNow (Completed)   POCT Flu A & B Status (Completed)     Assessment and Plan Assessment & Plan Acute febrile illness Acute febrile illness with negative COVID-19 and influenza tests.  - Order basic blood work to assess for infection or abnormalities. - Order chest x-ray to evaluate for pneumonia or pulmonary issues. - Consider broad-spectrum antibiotics   Carotid artery disease, status post carotid stent placement Carotid artery disease with stent placement.  - Coordinate with cardiology for preoperative clearance. .  Preoperative clearance for Inspire device Preoperative clearance required for Inspire device implantation. Discussed risks including 5% complication risk. - Coordinate with cardiology for preoperative clearance. - Complete all preoperative evaluations before surgery.  Cerebral aneurysm, status post repair Cerebral aneurysm successfully repaired.  Hypertension Hypertension previously managed with carvedilol . Self-discontinued due to headaches and low blood pressure. Discussed potential switch to losartan . - Discuss blood pressure management with cardiologist, including potential switch to  losartan . - Monitor blood pressure and kidney function closely if medication changes.  Chronic kidney disease, unspecified stage Chronic kidney disease with concerns about medication effects on kidney function. - Monitor kidney function closely, especially with hypertension medication changes.  Barrett's esophagus Barrett's esophagus managed with pantoprazole  - Discuss medication changes with primary care provider and specialists as needed.   Return in about 4 months (around 02/22/2024) for BP recheck.    Adina Crandall, NP

## 2023-10-23 NOTE — Patient Instructions (Signed)
 Nice to see you today I am waiting to hear from cardiology and then will make a decision on clearing you for surgery Follow up with me in 4 months

## 2023-10-25 ENCOUNTER — Telehealth (INDEPENDENT_AMBULATORY_CARE_PROVIDER_SITE_OTHER): Payer: Self-pay

## 2023-10-25 ENCOUNTER — Encounter: Payer: Self-pay | Admitting: Nurse Practitioner

## 2023-10-25 ENCOUNTER — Encounter (HOSPITAL_BASED_OUTPATIENT_CLINIC_OR_DEPARTMENT_OTHER): Payer: Self-pay | Admitting: *Deleted

## 2023-10-25 ENCOUNTER — Other Ambulatory Visit: Payer: Self-pay

## 2023-10-25 MED ORDER — CEFDINIR 300 MG PO CAPS: 300.0000 mg | ORAL_CAPSULE | Freq: Two times a day (BID) | ORAL | 0 refills | Status: DC

## 2023-10-25 NOTE — Telephone Encounter (Signed)
 I spoke with pt and he is not sure if ever taken the keflex or augmentin before. Pt said when he was younger penicillin caused him to break out in rash and hives but no problem breathing. Pt said he needs mild abx so it will not bother his stomach.Sending to [place].

## 2023-10-25 NOTE — Telephone Encounter (Signed)
 Pt notified as instructed and pt voiced understanding.pt will update if any signs or symptoms of reaction and pt voiced understanding if any swelling in neck, throat, mouth or tongue or any problems with breathing pt will go to ED.pt said he just remembered about 2 wks ago pt was outside and got some chigger bites at back of knee.  Pt said he did not know if that might have caused the abd pain, fever and sweating./ I advised pt chigger bites do not usually cause these symptoms and pt said to let CHRISTELLA Crandall NP know also. Pt does  not require cb unless Matt thinks chigger could cause symptoms.sending note to CHRISTELLA Crandall NP.

## 2023-10-25 NOTE — Telephone Encounter (Signed)
Added to open message.  

## 2023-10-25 NOTE — Addendum Note (Signed)
 Addended by: WENDEE LYNWOOD HERO on: 10/25/2023 02:21 PM   Modules accepted: Orders

## 2023-10-25 NOTE — Telephone Encounter (Signed)
 Called pt regarding instructions for Plavix . We received clearance and I let the patient know t hold Plavix  for 3 days starting on Friday 9/19 per cardiology. Cardiology leaves it up to Dr. Soldatova when to restart Plavix . I let him know that we would tell him when to start it again after surgery.  Patient understood.

## 2023-10-25 NOTE — Telephone Encounter (Signed)
 I spoke with pt; pt said since starting doxycycline  on 10/23/23 pt has felt off balance; pt has had sharp pain on and off lower lt abd and dull on and off pain under naval. Pt said he feels better after has big blow out BM; no diarrhea. No N&V. Pt fever has gone up and down but last night did not feel like high fever but t shirt was wet and clammy last night. Pt had no CP or SOB. Pt did have H/A yesterday. No vision changes. Pt said last night was cool in his house and ceiling fan was on but pt still was sweating. Pt said the sweating stopped about day breakj this morning. No cough. Earlier this AM T 99.5 but now pt does not feel like he has fever.  Now pt feels better but pt thinks doxycycline  is causing pt to feel bad with above sysmptms. Pt has taken 3 abx since 10/23/23. Pt would like to try different abx. Now BP 128/74 P 90.j [employer]. Offered pt appt and pt declined and wants to wait and see if Adina will send in new abx andto see how he is feeling. UC & ED precautions given and pt voiced understanding., sending note to Adina Crandall NP and [place]  pool.

## 2023-10-25 NOTE — Telephone Encounter (Signed)
 Discontinue the doxycyline and start cefdinir . This is related to PCN. Please report any signs/symptoms of reaction and give patient s/s of anaphylaxis

## 2023-10-25 NOTE — Telephone Encounter (Signed)
 Added from additional message. Please call to triage

## 2023-10-25 NOTE — Progress Notes (Signed)
 Patient's recent visit to see NP on 10-23-23 revealed neg covid and neg flu A and B, normal blood work and normal CXR. Patient described achy, feeling sluggish, sweats and low grade temp 99.1. Dr Myer Needle reviewed chart and OK for scheduled Drug Induced  Sleep Endoscopy.

## 2023-10-26 ENCOUNTER — Ambulatory Visit: Payer: Self-pay | Admitting: Nurse Practitioner

## 2023-10-28 ENCOUNTER — Encounter (HOSPITAL_BASED_OUTPATIENT_CLINIC_OR_DEPARTMENT_OTHER): Payer: Self-pay | Admitting: Anesthesiology

## 2023-10-28 NOTE — Anesthesia Preprocedure Evaluation (Signed)
 Anesthesia Evaluation  Patient identified by MRN, date of birth, ID band Patient awake    Reviewed: Allergy & Precautions, NPO status , Patient's Chart, lab work & pertinent test results, reviewed documented beta blocker date and time   Airway Mallampati: III  TM Distance: >3 FB     Dental  (+) Missing, Dental Advisory Given, Caps,    Pulmonary sleep apnea , Recent URI , Residual Cough, former smoker   Pulmonary exam normal breath sounds clear to auscultation       Cardiovascular hypertension, Pt. on medications and Pt. on home beta blockers + angina  + CAD and + Peripheral Vascular Disease  Normal cardiovascular exam+ dysrhythmias  Rhythm:Regular Rate:Normal  Hx/o right carotid stent  EKG 08/15/23 NSR, Normal  Echo 08/15/23 1. Left ventricular ejection fraction, by estimation, is 60 to 65%. Left  ventricular ejection fraction by 2D MOD biplane is 60.1 %. The left  ventricle has normal function. The left ventricle has no regional wall  motion abnormalities. There is mild  concentric left ventricular hypertrophy. Left ventricular diastolic  parameters are consistent with Grade I diastolic dysfunction (impaired  relaxation).   2. Right ventricular systolic function is normal. The right ventricular  size is normal.   3. A small pericardial effusion is present.   4. The mitral valve is normal in structure. No evidence of mitral valve  regurgitation. No evidence of mitral stenosis.   5. The aortic valve is tricuspid. Aortic valve regurgitation is not  visualized. Aortic valve sclerosis is present, with no evidence of aortic  valve stenosis.   Cardiac Cath 11/15/18 1. Severe single-vessel coronary artery disease involving a branch of OM1 (80% ostial stenosis) and distal LCx/OM2 (70%).  The affected vessels/branches are small and not well-suited to PCI (vessel size 2 mm or less). 2. Mild to moderate, non-obstructive coronary artery  disease involving the LMCA, LAD, and RCA. 3. Normal left ventricular filling pressure.    Neuro/Psych  PSYCHIATRIC DISORDERS Anxiety Depression    TIA   GI/Hepatic Neg liver ROS,GERD  Medicated,,  Endo/Other  HLD  Renal/GU Renal InsufficiencyRenal disease   BPH    Musculoskeletal negative musculoskeletal ROS (+)    Abdominal   Peds  Hematology  (+) Blood dyscrasia, anemia Plavix  therapy- last dose 9/16    Anesthesia Other Findings   Reproductive/Obstetrics                              Anesthesia Physical Anesthesia Plan  ASA: 3  Anesthesia Plan: MAC   Post-op Pain Management: Minimal or no pain anticipated   Induction: Intravenous  PONV Risk Score and Plan: 1 and Propofol  infusion and Treatment may vary due to age or medical condition  Airway Management Planned: Natural Airway  Additional Equipment: None  Intra-op Plan:   Post-operative Plan:   Informed Consent: I have reviewed the patients History and Physical, chart, labs and discussed the procedure including the risks, benefits and alternatives for the proposed anesthesia with the patient or authorized representative who has indicated his/her understanding and acceptance.     Dental advisory given  Plan Discussed with: CRNA and Anesthesiologist  Anesthesia Plan Comments:          Anesthesia Quick Evaluation

## 2023-10-30 ENCOUNTER — Encounter (HOSPITAL_BASED_OUTPATIENT_CLINIC_OR_DEPARTMENT_OTHER): Payer: Self-pay

## 2023-10-30 ENCOUNTER — Ambulatory Visit (HOSPITAL_BASED_OUTPATIENT_CLINIC_OR_DEPARTMENT_OTHER): Payer: Self-pay | Admitting: Anesthesiology

## 2023-10-30 ENCOUNTER — Other Ambulatory Visit (INDEPENDENT_AMBULATORY_CARE_PROVIDER_SITE_OTHER): Payer: Self-pay | Admitting: Otolaryngology

## 2023-10-30 ENCOUNTER — Other Ambulatory Visit: Payer: Self-pay

## 2023-10-30 ENCOUNTER — Ambulatory Visit (HOSPITAL_BASED_OUTPATIENT_CLINIC_OR_DEPARTMENT_OTHER)
Admission: RE | Admit: 2023-10-30 | Discharge: 2023-10-30 | Disposition: A | Discharge status: Home / Self Care | Attending: Otolaryngology | Admitting: Otolaryngology

## 2023-10-30 ENCOUNTER — Encounter (HOSPITAL_BASED_OUTPATIENT_CLINIC_OR_DEPARTMENT_OTHER)
Admission: RE | Disposition: A | Discharge status: Home / Self Care | Payer: Self-pay | Source: Home / Self Care | Attending: Otolaryngology

## 2023-10-30 DIAGNOSIS — N189 Chronic kidney disease, unspecified: Secondary | ICD-10-CM | POA: Insufficient documentation

## 2023-10-30 DIAGNOSIS — Z789 Other specified health status: Secondary | ICD-10-CM

## 2023-10-30 DIAGNOSIS — I2510 Atherosclerotic heart disease of native coronary artery without angina pectoris: Secondary | ICD-10-CM | POA: Diagnosis not present

## 2023-10-30 DIAGNOSIS — E7800 Pure hypercholesterolemia, unspecified: Secondary | ICD-10-CM | POA: Diagnosis not present

## 2023-10-30 DIAGNOSIS — Z87891 Personal history of nicotine dependence: Secondary | ICD-10-CM | POA: Insufficient documentation

## 2023-10-30 DIAGNOSIS — N183 Chronic kidney disease, stage 3 unspecified: Secondary | ICD-10-CM

## 2023-10-30 DIAGNOSIS — I129 Hypertensive chronic kidney disease with stage 1 through stage 4 chronic kidney disease, or unspecified chronic kidney disease: Secondary | ICD-10-CM | POA: Diagnosis not present

## 2023-10-30 DIAGNOSIS — E785 Hyperlipidemia, unspecified: Secondary | ICD-10-CM | POA: Insufficient documentation

## 2023-10-30 DIAGNOSIS — K219 Gastro-esophageal reflux disease without esophagitis: Secondary | ICD-10-CM | POA: Insufficient documentation

## 2023-10-30 DIAGNOSIS — G4733 Obstructive sleep apnea (adult) (pediatric): Secondary | ICD-10-CM

## 2023-10-30 DIAGNOSIS — I25119 Atherosclerotic heart disease of native coronary artery with unspecified angina pectoris: Secondary | ICD-10-CM | POA: Diagnosis not present

## 2023-10-30 DIAGNOSIS — Z01818 Encounter for other preprocedural examination: Secondary | ICD-10-CM

## 2023-10-30 HISTORY — PX: DRUG INDUCED ENDOSCOPY: SHX6808

## 2023-10-30 HISTORY — DX: Sleep apnea, unspecified: G47.30

## 2023-10-30 HISTORY — DX: Depression, unspecified: F32.A

## 2023-10-30 SURGERY — DRUG INDUCED SLEEP ENDOSCOPY
Anesthesia: Monitor Anesthesia Care | Site: Nose

## 2023-10-30 MED ORDER — LIDOCAINE-EPINEPHRINE 1 %-1:100000 IJ SOLN
INTRAMUSCULAR | Status: DC | PRN
  Administered 2023-10-30: 2 mL

## 2023-10-30 MED ORDER — LACTATED RINGERS IV SOLN: INTRAVENOUS | Status: DC

## 2023-10-30 MED ORDER — PROPOFOL 500 MG/50ML IV EMUL
INTRAVENOUS | Status: DC | PRN
  Administered 2023-10-30: 200 ug/kg/min via INTRAVENOUS

## 2023-10-30 MED ORDER — OXYMETAZOLINE HCL 0.05 % NA SOLN
NASAL | Status: DC | PRN
  Administered 2023-10-30: 1 via TOPICAL

## 2023-10-30 SURGICAL SUPPLY — 14 items
BRONCHOSCOPE PED SLIM DISP (MISCELLANEOUS) ×2 IMPLANT
CANISTER SUCT 1200ML W/VALVE (MISCELLANEOUS) IMPLANT
GLOVE BIO SURGEON STRL SZ 6 (GLOVE) ×2 IMPLANT
KIT CLEAN ENDO (MISCELLANEOUS) IMPLANT
NDL HYPO 22X1.5 SAFETY MO (MISCELLANEOUS) ×2 IMPLANT
NDL SAFETY ECLIPSE 18X1.5 (NEEDLE) IMPLANT
NEEDLE HYPO 22X1.5 SAFETY MO (MISCELLANEOUS) ×1 IMPLANT
PATTIES SURGICAL .5 X3 (DISPOSABLE) IMPLANT
SHEET MEDIUM DRAPE 40X70 STRL (DRAPES) IMPLANT
SOLUTION ANTFG W/FOAM PAD STRL (MISCELLANEOUS) ×2 IMPLANT
SURGILUBE 2OZ TUBE FLIPTOP (MISCELLANEOUS) ×2 IMPLANT
SYR 10ML LL (SYRINGE) ×2 IMPLANT
TOWEL GREEN STERILE FF (TOWEL DISPOSABLE) ×2 IMPLANT
TUBE CONNECTING 20X1/4 (TUBING) IMPLANT

## 2023-10-30 NOTE — Op Note (Signed)
 Operative note  Preoperative diagnosis: OSA Postoperative diagnosis:   Same  Procedure:DISE ( Drug induced sleep endoscopy)  Anesthesia: Topical lidocaine  gel Complications: None Condition is stable throughout exam  Indications and consent:   The patient presents to my otolaryngology clinic with a chief complaint of OSA  Because of pt's OSA and desire to be a candidate for Inspire therapy, it was recommended that they undergo a flexible fiberoptic laryngoscopy under sedation (DISE).   All the risks, benefits, and potential complications were reviewed with the patient preoperatively and informed consent was obtained.  Procedure: Pt was brought back to the OR and laid supine on the table. Propofol  anesthesia was administered per protocol until pt reached optimal level of sedation. DISE exam showed the following anatomic collapse pattern.  VOTE classification Velopharynx- 2 A-P Oropharynx- 1 Tongue base- 2 Epiglottis- 1  There was no evidence of complete concentric collapse.  Based on the DISE findings, pt is a candidate for Hypoglossal nerve stimulation (Inspire therapy) based on the above anatomy.

## 2023-10-30 NOTE — H&P (Signed)
 John Salazar is an 77 y.o. male.    Chief Complaint:  OSA  HPI: Patient presents today for planned elective procedure.  He/she denies any interval change in history since office visit on 09/27/23.  Past Medical History:  Diagnosis Date   Anxiety    Ascending aorta dilation (HCC) 08/22/2021   Echocardiogram 04/2021: 40 mm   Barrett esophagus    BPH (benign prostatic hyperplasia)    Cerebral aneurysm    Chronic kidney disease    stage 2   Colon polyps    Coronary artery disease    mild-mod non-obstructive CAD   Depression    Erectile dysfunction    GERD (gastroesophageal reflux disease)    History of kidney stones    History of stomach ulcers    Hypercholesteremia    Hypertension    Internal carotid artery stenosis, right 06/29/2020   Positive TB test    in the past   Sleep apnea    does not use CPAP    Past Surgical History:  Procedure Laterality Date   APPENDECTOMY  1970   CATARACT EXTRACTION W/ INTRAOCULAR LENS  IMPLANT, BILATERAL     COLONOSCOPY  10/2012   ESOPHAGOGASTRODUODENOSCOPY ENDOSCOPY  10/2012   HERNIA REPAIR     IR 3D INDEPENDENT WKST  04/14/2020   IR ANGIO INTRA EXTRACRAN SEL COM CAROTID INNOMINATE BILAT MOD SED  08/09/2017   IR ANGIO INTRA EXTRACRAN SEL COM CAROTID INNOMINATE BILAT MOD SED  11/08/2018   IR ANGIO INTRA EXTRACRAN SEL COM CAROTID INNOMINATE BILAT MOD SED  04/14/2020   IR ANGIO INTRA EXTRACRAN SEL COM CAROTID INNOMINATE UNI L MOD SED  06/29/2020   IR ANGIO INTRA EXTRACRAN SEL INTERNAL CAROTID UNI L MOD SED  05/20/2020   IR ANGIO VERTEBRAL SEL VERTEBRAL BILAT MOD SED  08/09/2017   IR ANGIO VERTEBRAL SEL VERTEBRAL BILAT MOD SED  11/08/2018   IR ANGIO VERTEBRAL SEL VERTEBRAL BILAT MOD SED  04/14/2020   IR ANGIOGRAM FOLLOW UP STUDY  05/20/2020   IR INTRAVSC STENT CERV CAROTID W/EMB-PROT MOD SED INCL ANGIO  08/23/2017   IR INTRAVSC STENT CERV CAROTID W/EMB-PROT MOD SED INCL ANGIO  06/29/2020   IR RADIOLOGIST EVAL & MGMT  05/05/2020   IR RADIOLOGIST EVAL & MGMT   06/03/2020   IR RADIOLOGIST EVAL & MGMT  07/15/2020   IR TRANSCATH/EMBOLIZ  05/18/2020   IR US  GUIDE VASC ACCESS RIGHT  04/14/2020   IR US  GUIDE VASC ACCESS RIGHT  05/20/2020   IR US  GUIDE VASC ACCESS RIGHT  06/29/2020   LEFT HEART CATH AND CORONARY ANGIOGRAPHY N/A 11/15/2018   Procedure: LEFT HEART CATH AND CORONARY ANGIOGRAPHY;  Surgeon: Mady Bruckner, MD;  Location: [facility];  Service: Cardiovascular;  Laterality: N/A;   PROSTATE SURGERY  1999   20 years ago, enlarged prostate   RADIOLOGY WITH ANESTHESIA N/A 08/23/2017   Procedure: IR WITH ANESTHESIA STENT PLACEMENT;  Surgeon: Dolphus Carrion, MD;  Location: [facility];  Service: Radiology;  Laterality: N/A;   RADIOLOGY WITH ANESTHESIA N/A 05/18/2020   Procedure: IR WITH ANESTHESIA EMBOLIZATION;  Surgeon: Dolphus Carrion, MD;  Location: [facility];  Service: Radiology;  Laterality: N/A;   RADIOLOGY WITH ANESTHESIA Left 06/29/2020   Procedure: RADIOLOGY WITH ANESTHESIA  LEFT CAROTID STENT PLACEMENT;  Surgeon: Dolphus Carrion, MD;  Location: [facility];  Service: Radiology;  Laterality: Left;    Family History  Problem Relation Age of Onset   Stroke Mother 42   Aneurysm Father 46  Social History:  reports that he quit smoking about 27 years ago. His smoking use included cigarettes. He started smoking about 52 years ago. He has a 50 pack-year smoking history. He has never used smokeless tobacco. He reports that he does not drink alcohol and does not use drugs.  Allergies:  Allergies  Allergen Reactions   Bee Venom Anaphylaxis and Shortness Of Breath   Penicillins Swelling and Rash    PATIENT HAS HAD A PCN REACTION WITH IMMEDIATE RASH, FACIAL/TONGUE/THROAT SWELLING, SOB, OR LIGHTHEADEDNESS WITH HYPOTENSION:  #  #  YES  #  #      Medications Prior to Admission  Medication Sig Dispense Refill   amLODipine  (NORVASC ) 5 MG tablet Take 5 mg by mouth daily.     carvedilol  (COREG ) 3.125 MG tablet Take 1 tablet (3.125 mg total) by mouth 2  (two) times daily with a meal. 180 tablet 3   clopidogrel  (PLAVIX ) 75 MG tablet TAKE ONE TABLET BY MOUTH DAILY 90 tablet 3   escitalopram  (LEXAPRO ) 20 MG tablet Take 1 tablet (20 mg total) by mouth daily. (Patient taking differently: Take 20 mg by mouth at bedtime.) 90 tablet 3   isosorbide  mononitrate (IMDUR ) 30 MG 24 hr tablet Take 0.5 tablets (15 mg total) by mouth daily. AS NEEDED (Patient taking differently: Take 15 mg by mouth daily as needed (for boold vessels per patient).) 30 tablet 6   pantoprazole  (PROTONIX ) 40 MG tablet Take 1 tablet (40 mg total) by mouth 2 (two) times daily. 180 tablet 3   rosuvastatin  (CRESTOR ) 40 MG tablet Take one (1) tablet by mouth (40 mg) daily. (Patient taking differently: Take 40 mg by mouth every evening.) 90 tablet 3   tamsulosin  (FLOMAX ) 0.4 MG CAPS capsule Take one capsule once daily ( thirty minutes after same meal daily) (Patient taking differently: Take 0.4 mg by mouth in the morning and at bedtime.)     cefdinir  (OMNICEF ) 300 MG capsule Take 1 capsule (300 mg total) by mouth 2 (two) times daily. 14 capsule 0   famotidine  (PEPCID ) 20 MG tablet Take 20 mg by mouth daily as needed for heartburn or indigestion.     nitroGLYCERIN  (NITROSTAT ) 0.4 MG SL tablet Place 1 tablet (0.4 mg total) under the tongue every 5 (five) minutes as needed for chest pain. 25 tablet prn   Tdap (BOOSTRIX ) 5-2.5-18.5 LF-MCG/0.5 injection Inject into the muscle. 0.5 mL 0    No results found for this or any previous visit (from the past 48 hours). No results found.  ROS:  ROS: Constitutional: Negative for fever, weight loss and weight gain. Cardiovascular: Negative for chest pain and dyspnea on exertion. Respiratory: Is not experiencing shortness of breath at rest. Gastrointestinal: Negative for nausea and vomiting. Neurological: Negative for headaches. Psychiatric: The patient is not nervous/anxious   Blood pressure (!) 139/95, pulse 91, temperature (!) 100.5 F (38.1 C),  temperature source Temporal, height 5' 9 (1.753 m), weight 79.2 kg, SpO2 97%.  PHYSICAL EXAM: Exam: General: Well-developed, well-nourished Respiratory Respiratory effort: Equal inspiration and expiration without stridor Cardiovascular Peripheral Vascular: Warm extremities with equal color/perfusion Eyes: No nystagmus with equal extraocular motion bilaterally Neuro/Psych/Balance: Patient oriented to person, place, and time; Appropriate mood and affect; Gait is intact with no imbalance; Cranial nerves I-XII are intact Head and Face Inspection: Normocephalic and atraumatic without mass or lesion Palpation: Facial skeleton intact without bony stepoffs Salivary Glands: No mass or tenderness  Facial Strength: Facial motility symmetric and full bilaterally ENT Pinna: External  ear intact and fully developed External canal: Canal is patent with intact skin Tympanic Membrane: Clear and mobile External Nose: No scar or anatomic deformity Internal Nose: Septum is relatively straight on anterior rhinoscopy. Bilateral inferior turbinate hypertrophy.  Lips, Teeth, and gums: Mucosa and teeth intact and viable Oral cavity/oropharynx: No erythema or exudate, no lesions present Neck Neck and Trachea: Midline trachea without mass or lesion Thyroid : No mass or nodularity Lymphatics: No lymphadenopathy    Assessment/Plan OSA CPAP intolerance  - proceed with DISE    Waqas Bruhl 10/30/2023, 7:26 AM

## 2023-10-30 NOTE — Anesthesia Postprocedure Evaluation (Signed)
 Anesthesia Post Note  Patient: John Salazar  Procedure(s) Performed: DRUG INDUCED SLEEP ENDOSCOPY (Nose)     Patient location during evaluation: PACU Anesthesia Type: MAC Level of consciousness: awake and alert and oriented Pain management: pain level controlled Vital Signs Assessment: post-procedure vital signs reviewed and stable Respiratory status: spontaneous breathing, nonlabored ventilation and respiratory function stable Cardiovascular status: stable and blood pressure returned to baseline Postop Assessment: no apparent nausea or vomiting Anesthetic complications: no   No notable events documented.  Last Vitals:  Vitals:   10/30/23 0745 10/30/23 0755  BP: 119/71 128/69  Pulse: 83 89  Resp: (!) 27   Temp: 37.4 C 37.7 C  SpO2: 97% 96%    Last Pain:  Vitals:   10/30/23 0755  TempSrc: Temporal  PainSc: 0-No pain                 [name] A.

## 2023-10-30 NOTE — Transfer of Care (Signed)
 Immediate Anesthesia Transfer of Care Note  Patient: John Salazar  Procedure(s) Performed: DRUG INDUCED SLEEP ENDOSCOPY (Nose)  Patient Location: PACU  Anesthesia Type:MAC  Level of Consciousness: awake, alert , and oriented  Airway & Oxygen Therapy: Patient Spontanous Breathing and Patient connected to nasal cannula oxygen  Post-op Assessment: Report given to RN and Post -op Vital signs reviewed and stable  Post vital signs: Reviewed and stable  Last Vitals:  Vitals Value Taken Time  BP 119/71 10/30/23 07:45  Temp 37.4 C 10/30/23 07:45  Pulse 83 10/30/23 07:46  Resp 28 10/30/23 07:46  SpO2 97 % 10/30/23 07:46  Vitals shown include unfiled device data.  Last Pain:  Vitals:   10/30/23 0745  TempSrc:   PainSc: 0-No pain      Patients Stated Pain Goal: 3 (10/30/23 0631)  Complications: No notable events documented.

## 2023-10-30 NOTE — Discharge Instructions (Addendum)
 DRUG-INDUCED SLEEP ENDOSCOPY POST-OPERATIVE INSTRUCTIONS:  Based on the drug-induced sleep endoscopy today, you were deemed a candidate for Inspire Therapy.  Please review post-operative and recovery instructions below that you will need to be aware of after Inspire Implant surgery.  Please restart all of your home medications if you take anything on a daily basis.  You can resume regular diet after this procedure.  You will be scheduled for pre-operative appointment with Dr. Soldatova to review details about surgery and to discuss the next steps.   DIET: Resume normal diet HYGIENE: Please wait until 48 hours after surgery before getting incisions on neck, chest, and torso wet. In the first 48 hours after surgery, will likely need to take sponge baths. WOUND CARE: Please leave pressure dressing on for 48 hours after surgery. Gently place antibiotic ointment over incisions 2 times per day; use clean q-tip. May place a clean bandage over incisions as needed. After 48 hours, you may get incisions wet with warm soap and water, but do not soak the incisions.  Pat area dry gently.  Immediately place antibiotic ointment. Take oral antibiotics as prescribed If skin around incision starts to get red (> 1cm), swollen, and/or more painful, please call the office ACTIVITY: Try to avoid sleeping on the side of your surgery, to the extent possible.   You may walk for exercise starting the day after surgery. For 2 weeks: Do not pick up anything greater than 5 pounds with the hand/arm that's on the same side as the surgery.  After 2 weeks, you may increase weight to 10 pounds.   Consider performing neck rolls 10 clockwise and 10 counterclockwise 3x/day. For 4 weeks, no strenuous activity (running, jogging, lifting weights, gardening, sports) or until cleared by physician.   PAIN MEDICATIONS: You will be prescribed xxx for pain.   If pain is not severe, consider taking Tylenol  650mg  every 6 hours Avoid  aspirin  for 7 days after surgery Avoid direct heat (such as heating pads) to incision sites.   May gently place ice over surgery sites as needed.  Please place a thin clean towel over skin first and then place ice bag over towel.  Ice for 10 minutes at a time only.  POST-OPERATIVE CLINIC APPOINTMENTS: 1 week: suture removal and wound check in the office.  1 month: device activation and wound check in the office. 2.5 months: check in visit to assess usage. 3-4 months: titration sleep study based on usage of >4 hrs/night.  4 months: final wound check in the office.  Yearly: device check at office.  SCAR CARE: After incisions have healed, you will have a scar, which will continue to evolve over the course of 12 months.  Caring for your incision scars will help them to be as minimal as possible. If you are out in the sun with incision exposure, please remember to place sunscreen over the incision and surrounding skin.   You may use vitamin E or "Scar ointment/cream" to help soften scar.  Please wait one month after surgery before starting this.     Post Anesthesia Home Care Instructions  Activity: Get plenty of rest for the remainder of the day. A responsible individual must stay with you for 24 hours following the procedure.  For the next 24 hours, DO NOT: -Drive a car -Advertising copywriter -Drink alcoholic beverages -Take any medication unless instructed by your physician -Make any legal decisions or sign important papers.  Meals: Start with liquid foods such as gelatin or soup.  Progress to regular foods as tolerated. Avoid greasy, spicy, heavy foods. If nausea and/or vomiting occur, drink only clear liquids until the nausea and/or vomiting subsides. Call your physician if vomiting continues.  Special Instructions/Symptoms: Your throat may feel dry or sore from the anesthesia or the breathing tube placed in your throat during surgery. If this causes discomfort, gargle with warm salt water.  The discomfort should disappear within 24 hours.  If you had a scopolamine patch placed behind your ear for the management of post- operative nausea and/or vomiting:  1. The medication in the patch is effective for 72 hours, after which it should be removed.  Wrap patch in a tissue and discard in the trash. Wash hands thoroughly with soap and water. 2. You may remove the patch earlier than 72 hours if you experience unpleasant side effects which may include dry mouth, dizziness or visual disturbances. 3. Avoid touching the patch. Wash your hands with soap and water after contact with the patch.    Post Anesthesia Home Care Instructions  Activity: Get plenty of rest for the remainder of the day. A responsible individual must stay with you for 24 hours following the procedure.  For the next 24 hours, DO NOT: -Drive a car -Advertising copywriter -Drink alcoholic beverages -Take any medication unless instructed by your physician -Make any legal decisions or sign important papers.  Meals: Start with liquid foods such as gelatin or soup. Progress to regular foods as tolerated. Avoid greasy, spicy, heavy foods. If nausea and/or vomiting occur, drink only clear liquids until the nausea and/or vomiting subsides. Call your physician if vomiting continues.  Special Instructions/Symptoms: Your throat may feel dry or sore from the anesthesia or the breathing tube placed in your throat during surgery. If this causes discomfort, gargle with warm salt water. The discomfort should disappear within 24 hours.

## 2023-10-31 ENCOUNTER — Encounter (HOSPITAL_BASED_OUTPATIENT_CLINIC_OR_DEPARTMENT_OTHER): Payer: Self-pay | Admitting: Otolaryngology

## 2023-11-08 ENCOUNTER — Ambulatory Visit (HOSPITAL_BASED_OUTPATIENT_CLINIC_OR_DEPARTMENT_OTHER)

## 2023-11-08 ENCOUNTER — Encounter (HOSPITAL_BASED_OUTPATIENT_CLINIC_OR_DEPARTMENT_OTHER): Payer: Self-pay | Admitting: Student

## 2023-11-08 ENCOUNTER — Ambulatory Visit (HOSPITAL_BASED_OUTPATIENT_CLINIC_OR_DEPARTMENT_OTHER): Admitting: Student

## 2023-11-08 DIAGNOSIS — M542 Cervicalgia: Secondary | ICD-10-CM

## 2023-11-08 DIAGNOSIS — M47812 Spondylosis without myelopathy or radiculopathy, cervical region: Secondary | ICD-10-CM | POA: Diagnosis not present

## 2023-11-08 MED ORDER — METHYLPREDNISOLONE 4 MG PO TBPK: ORAL_TABLET | ORAL | 0 refills | Status: DC

## 2023-11-08 NOTE — Progress Notes (Signed)
 Chief Complaint: Neck pain    Discussed the use of AI scribe software for clinical note transcription with the patient, who gave verbal consent to proceed.  History of Present Illness John Salazar is a 78 year old male who presents with neck pain.  He has experienced neck pain and restricted movement for two weeks. The pain is persistent, present even at rest, and affects his ability to turn his head during activities such as driving. It is bilateral and worsens with motion. He has a history of neck problems that usually resolve spontaneously, but this episode has not improved. He uses Tylenol  and occasionally Advil for relief. He experiences a frequent headaches during this episode of neck pain, described as severe and similar to a caffeine withdrawal headache. There are no associated vision changes or blurriness. His blood pressure readings at home are stable, around 120-130 mmHg. He has difficulty finding a comfortable pillow for his neck, having tried various types without success.   Surgical History:   None  PMH/PSH/Family History/Social History/Meds/Allergies:    Past Medical History:  Diagnosis Date   Anxiety    Ascending aorta dilation 08/22/2021   Echocardiogram 04/2021: 40 mm   Barrett esophagus    BPH (benign prostatic hyperplasia)    Cerebral aneurysm    Chronic kidney disease    stage 2   Colon polyps    Coronary artery disease    mild-mod non-obstructive CAD   Depression    Erectile dysfunction    GERD (gastroesophageal reflux disease)    History of kidney stones    History of stomach ulcers    Hypercholesteremia    Hypertension    Internal carotid artery stenosis, right 06/29/2020   Positive TB test    in the past   Sleep apnea    does not use CPAP   Past Surgical History:  Procedure Laterality Date   APPENDECTOMY  1970   CATARACT EXTRACTION W/ INTRAOCULAR LENS  IMPLANT, BILATERAL     COLONOSCOPY  10/2012   DRUG  INDUCED ENDOSCOPY N/A 10/30/2023   Procedure: DRUG INDUCED SLEEP ENDOSCOPY;  Surgeon: Okey Burns, MD;  Location: [facility];  Service: ENT;  Laterality: N/A;   ESOPHAGOGASTRODUODENOSCOPY ENDOSCOPY  10/2012   HERNIA REPAIR     IR 3D INDEPENDENT WKST  04/14/2020   IR ANGIO INTRA EXTRACRAN SEL COM CAROTID INNOMINATE BILAT MOD SED  08/09/2017   IR ANGIO INTRA EXTRACRAN SEL COM CAROTID INNOMINATE BILAT MOD SED  11/08/2018   IR ANGIO INTRA EXTRACRAN SEL COM CAROTID INNOMINATE BILAT MOD SED  04/14/2020   IR ANGIO INTRA EXTRACRAN SEL COM CAROTID INNOMINATE UNI L MOD SED  06/29/2020   IR ANGIO INTRA EXTRACRAN SEL INTERNAL CAROTID UNI L MOD SED  05/20/2020   IR ANGIO VERTEBRAL SEL VERTEBRAL BILAT MOD SED  08/09/2017   IR ANGIO VERTEBRAL SEL VERTEBRAL BILAT MOD SED  11/08/2018   IR ANGIO VERTEBRAL SEL VERTEBRAL BILAT MOD SED  04/14/2020   IR ANGIOGRAM FOLLOW UP STUDY  05/20/2020   IR INTRAVSC STENT CERV CAROTID W/EMB-PROT MOD SED INCL ANGIO  08/23/2017   IR INTRAVSC STENT CERV CAROTID W/EMB-PROT MOD SED INCL ANGIO  06/29/2020   IR RADIOLOGIST EVAL & MGMT  05/05/2020   IR RADIOLOGIST EVAL & MGMT  06/03/2020   IR RADIOLOGIST EVAL &  MGMT  07/15/2020   IR TRANSCATH/EMBOLIZ  05/18/2020   IR US  GUIDE VASC ACCESS RIGHT  04/14/2020   IR US  GUIDE VASC ACCESS RIGHT  05/20/2020   IR US  GUIDE VASC ACCESS RIGHT  06/29/2020   LEFT HEART CATH AND CORONARY ANGIOGRAPHY N/A 11/15/2018   Procedure: LEFT HEART CATH AND CORONARY ANGIOGRAPHY;  Surgeon: Mady Bruckner, MD;  Location: [facility];  Service: Cardiovascular;  Laterality: N/A;   PROSTATE SURGERY  1999   20 years ago, enlarged prostate   RADIOLOGY WITH ANESTHESIA N/A 08/23/2017   Procedure: IR WITH ANESTHESIA STENT PLACEMENT;  Surgeon: Dolphus Carrion, MD;  Location: [facility];  Service: Radiology;  Laterality: N/A;   RADIOLOGY WITH ANESTHESIA N/A 05/18/2020   Procedure: IR WITH ANESTHESIA EMBOLIZATION;  Surgeon: Dolphus Carrion, MD;  Location: [facility];   Service: Radiology;  Laterality: N/A;   RADIOLOGY WITH ANESTHESIA Left 06/29/2020   Procedure: RADIOLOGY WITH ANESTHESIA  LEFT CAROTID STENT PLACEMENT;  Surgeon: Dolphus Carrion, MD;  Location: [facility];  Service: Radiology;  Laterality: Left;   Social History   Socioeconomic History   Marital status: Married    Spouse name: pamela   Number of children: 2   Years of education: 12   Highest education level: GED or equivalent  Occupational History   Occupation: Comptroller.  Tobacco Use   Smoking status: Former    Current packs/day: 0.00    Average packs/day: 2.0 packs/day for 25.0 years (50.0 ttl pk-yrs)    Types: Cigarettes    Start date: 03/11/1971    Quit date: 03/10/1996    Years since quitting: 27.6   Smokeless tobacco: Never  Vaping Use   Vaping status: Never Used  Substance and Sexual Activity   Alcohol use: No    Alcohol/week: 0.0 standard drinks of alcohol    Comment: in the past drank, quit 1993   Drug use: No   Sexual activity: Not Currently  Other Topics Concern   Not on file  Social History Narrative   Lives with Sharlet (Wife) - together for 28 years   Children - Randine and Mliss -- one is a Engineer, civil (consulting) in [facility] and other works for [employer] - 18, lives in [place]   Enjoys - playing golf, goes to [employer] and sponsors people as well, church   Support - family, good friends in [place]   Exercise - walks the dog, runs up hills   Diet - improved from before, tries to drink water - low salt or no salt      Retail buyer for [employer]    Social Drivers of Health   Financial Resource Strain: Low Risk  (08/22/2023)   Overall Financial Resource Strain (CARDIA)    Difficulty of Paying Living Expenses: Not hard at all  Food Insecurity: No Food Insecurity (08/22/2023)   Hunger Vital Sign    Worried About Running Out of Food in the Last Year: Never true    Ran Out of Food in the Last Year: Never true  Transportation Needs: No Transportation  Needs (08/22/2023)   PRAPARE - Administrator, Civil Service (Medical): No    Lack of Transportation (Non-Medical): No  Physical Activity: Insufficiently Active (08/22/2023)   Exercise Vital Sign    Days of Exercise per Week: 4 days    Minutes of Exercise per Session: 30 min  Stress: No Stress Concern Present (08/22/2023)   [employer] of Occupational Health - Occupational  Stress Questionnaire    Feeling of Stress: Not at all  Social Connections: Socially Integrated (08/22/2023)   Social Connection and Isolation Panel    Frequency of Communication with Friends and Family: More than three times a week    Frequency of Social Gatherings with Friends and Family: More than three times a week    Attends Religious Services: More than 4 times per year    Active Member of [employer] or Organizations: Yes    Attends Engineer, structural: More than 4 times per year    Marital Status: Married   Family History  Problem Relation Age of Onset   Stroke Mother 70   Aneurysm Father 27   Allergies  Allergen Reactions   Bee Venom Anaphylaxis and Shortness Of Breath   Penicillins Swelling and Rash    PATIENT HAS HAD A PCN REACTION WITH IMMEDIATE RASH, FACIAL/TONGUE/THROAT SWELLING, SOB, OR LIGHTHEADEDNESS WITH HYPOTENSION:  #  #  YES  #  #     Current Outpatient Medications  Medication Sig Dispense Refill   methylPREDNISolone  (MEDROL  DOSEPAK) 4 MG TBPK tablet Take per packet instructions 1 each 0   amLODipine  (NORVASC ) 5 MG tablet Take 5 mg by mouth daily.     carvedilol  (COREG ) 3.125 MG tablet Take 1 tablet (3.125 mg total) by mouth 2 (two) times daily with a meal. 180 tablet 3   cefdinir  (OMNICEF ) 300 MG capsule Take 1 capsule (300 mg total) by mouth 2 (two) times daily. 14 capsule 0   clopidogrel  (PLAVIX ) 75 MG tablet TAKE ONE TABLET BY MOUTH DAILY 90 tablet 3   escitalopram  (LEXAPRO ) 20 MG tablet Take 1 tablet (20 mg total) by mouth daily. (Patient taking differently: Take 20  mg by mouth at bedtime.) 90 tablet 3   famotidine  (PEPCID ) 20 MG tablet Take 20 mg by mouth daily as needed for heartburn or indigestion.     isosorbide  mononitrate (IMDUR ) 30 MG 24 hr tablet Take 0.5 tablets (15 mg total) by mouth daily. AS NEEDED (Patient taking differently: Take 15 mg by mouth daily as needed (for boold vessels per patient).) 30 tablet 6   nitroGLYCERIN  (NITROSTAT ) 0.4 MG SL tablet Place 1 tablet (0.4 mg total) under the tongue every 5 (five) minutes as needed for chest pain. 25 tablet prn   pantoprazole  (PROTONIX ) 40 MG tablet Take 1 tablet (40 mg total) by mouth 2 (two) times daily. 180 tablet 3   rosuvastatin  (CRESTOR ) 40 MG tablet Take one (1) tablet by mouth (40 mg) daily. (Patient taking differently: Take 40 mg by mouth every evening.) 90 tablet 3   tamsulosin  (FLOMAX ) 0.4 MG CAPS capsule Take one capsule once daily ( thirty minutes after same meal daily) (Patient taking differently: Take 0.4 mg by mouth in the morning and at bedtime.)     Tdap (BOOSTRIX ) 5-2.5-18.5 LF-MCG/0.5 injection Inject into the muscle. 0.5 mL 0   No current facility-administered medications for this visit.   No results found.  Review of Systems:   A ROS was performed including pertinent positives and negatives as documented in the HPI.  Physical Exam :   Constitutional: NAD and appears stated age Neurological: Alert and oriented Psych: Appropriate affect and cooperative There were no vitals taken for this visit.   Comprehensive Musculoskeletal Exam:    Mild tenderness palpation over the midline and paraspinal muscles of the cervical spine.  Significant decrease in range of motion particular with bilateral rotation and sidebending.  Overall well-maintained flexion and extension.  Grip strength 5/5 bilaterally.  Imaging:   Xray (cervical spine 4 view): Degenerative changes most notable at the C5-C7 levels with decreased to space and anterior osteophyte formation. Carotid stents  present.   I personally reviewed and interpreted the radiographs.      Assessment & Plan Cervicalgia with associated headache   Chronic neck pain has recently worsened, with limited range of motion and rest pain. Headaches may be linked to recent changes in antihypertensive medication. X-rays reveal degenerative changes without acute abnormality.  Headache appears to be more cervicogenic in nature, although given his extensive history including brain aneurysm and carotid stent placement, recommend follow-up with his PCP if headaches persist and discussed red flag symptoms prompting emergent evaluation.  If neck pain persists or he develops radicular symptoms, could consider an MRI to evaluate for potential epidural steroid injection.  Will prescribe Medrol  Dosepak today for symptom relief.   Trigger finger   Intermittent left index finger triggering occurs, especially in the morning, but symptoms are not severe. Consider a corticosteroid injection if symptoms worsen, avoiding this during the current steroid treatment for cervicalgia.     I personally saw and evaluated the patient, and participated in the management and treatment plan.  Leonce Reveal, PA-C Orthopedics

## 2023-11-10 ENCOUNTER — Encounter: Payer: Self-pay | Admitting: Nurse Practitioner

## 2023-11-20 ENCOUNTER — Encounter: Payer: Self-pay | Admitting: Internal Medicine

## 2023-11-21 MED ORDER — AMLODIPINE BESYLATE 5 MG PO TABS: 5.0000 mg | ORAL_TABLET | Freq: Two times a day (BID) | ORAL | Status: DC

## 2023-11-21 NOTE — Telephone Encounter (Signed)
 Good to hear that headaches gone Pt could try going up on amlodipine  to 5 mg 2x per day   Follow BP

## 2023-11-24 ENCOUNTER — Encounter: Payer: Self-pay | Admitting: Internal Medicine

## 2023-11-24 ENCOUNTER — Encounter: Payer: Self-pay | Admitting: Gastroenterology

## 2023-11-24 ENCOUNTER — Ambulatory Visit (HOSPITAL_BASED_OUTPATIENT_CLINIC_OR_DEPARTMENT_OTHER): Admitting: Pulmonary Disease

## 2023-11-24 ENCOUNTER — Telehealth (INDEPENDENT_AMBULATORY_CARE_PROVIDER_SITE_OTHER): Payer: Self-pay

## 2023-11-24 ENCOUNTER — Telehealth: Payer: Self-pay

## 2023-11-24 ENCOUNTER — Telehealth (HOSPITAL_BASED_OUTPATIENT_CLINIC_OR_DEPARTMENT_OTHER): Payer: Self-pay

## 2023-11-24 ENCOUNTER — Encounter: Payer: Self-pay | Admitting: Nurse Practitioner

## 2023-11-24 NOTE — Telephone Encounter (Signed)
  Patient Consent for Virtual Visit        John Salazar has provided verbal consent on 11/24/2023 for a virtual visit (video or telephone).   CONSENT FOR VIRTUAL VISIT FOR:  John Salazar  By participating in this virtual visit I agree to the following:  I hereby voluntarily request, consent and authorize [facility] and its employed or contracted physicians, physician assistants, nurse practitioners or other licensed health care professionals (the Practitioner), to provide me with telemedicine health care services (the "Services) as deemed necessary by the treating Practitioner. I acknowledge and consent to receive the Services by the Practitioner via telemedicine. I understand that the telemedicine visit will involve communicating with the Practitioner through live audiovisual communication technology and the disclosure of certain medical information by electronic transmission. I acknowledge that I have been given the opportunity to request an in-person assessment or other available alternative prior to the telemedicine visit and am voluntarily participating in the telemedicine visit.  I understand that I have the right to withhold or withdraw my consent to the use of telemedicine in the course of my care at any time, without affecting my right to future care or treatment, and that the Practitioner or I may terminate the telemedicine visit at any time. I understand that I have the right to inspect all information obtained and/or recorded in the course of the telemedicine visit and may receive copies of available information for a reasonable fee.  I understand that some of the potential risks of receiving the Services via telemedicine include:  Delay or interruption in medical evaluation due to technological equipment failure or disruption; Information transmitted may not be sufficient (e.g. poor resolution of images) to allow for appropriate medical decision making by the  Practitioner; and/or  In rare instances, security protocols could fail, causing a breach of personal health information.  Furthermore, I acknowledge that it is my responsibility to provide information about my medical history, conditions and care that is complete and accurate to the best of my ability. I acknowledge that Practitioner's advice, recommendations, and/or decision may be based on factors not within their control, such as incomplete or inaccurate data provided by me or distortions of diagnostic images or specimens that may result from electronic transmissions. I understand that the practice of medicine is not an exact science and that Practitioner makes no warranties or guarantees regarding treatment outcomes. I acknowledge that a copy of this consent can be made available to me via my patient portal [facility] MyChart), or I can request a printed copy by calling the office of [facility].    I understand that my insurance will be billed for this visit.   I have read or had this consent read to me. I understand the contents of this consent, which adequately explains the benefits and risks of the Services being provided via telemedicine.  I have been provided ample opportunity to ask questions regarding this consent and the Services and have had my questions answered to my satisfaction. I give my informed consent for the services to be provided through the use of telemedicine in my medical care

## 2023-11-24 NOTE — Telephone Encounter (Signed)
   Name: John Salazar  DOB: [birth year]  MRN: [id number]  Primary Cardiologist: Vina Gull, MD   Preoperative team, please contact this patient and set up a phone call appointment for further preoperative risk assessment. Please obtain consent and complete medication review. Thank you for your help.  I confirm that guidance regarding antiplatelet and oral anticoagulation therapy has been completed and, if necessary, noted below.  Per office protocol, if patient is without any new symptoms or concerns at the time of their virtual visit, he may hold Plavix  for 5 days prior to procedure. Please resume Plavix  as soon as possible postprocedure, at the discretion of the surgeon.    I also confirmed the patient resides in the state of [place] . As per [facility] Medical Board telemedicine laws, the patient must reside in the state in which the provider is licensed.   [name], NP 11/24/2023, 4:09 PM [facility]

## 2023-11-24 NOTE — Telephone Encounter (Signed)
   Pre-operative Risk Assessment    Patient Name: John Salazar  DOB: [birth year] MRN: [id number]   Date of last office visit: 02/14/2023- Dr. Vina Gull MD Date of next office visit: None   Request for Surgical Clearance    Procedure:  Inspire implant  Date of Surgery:  Clearance TBD                                Surgeon:  Dr. Anice Socks Group or Practice Name:  [facility] Phone number:  [phone] Fax number:  [phone]   Type of Clearance Requested:   - Medical  - Pharmacy:  Hold Clopidogrel  (Plavix )     Type of Anesthesia:  General    Additional requests/questions:    John Salazar John Salazar   11/24/2023, 3:56 PM

## 2023-11-24 NOTE — Telephone Encounter (Signed)
 Preop tele appt now scheduled, med rec and consent done.

## 2023-11-27 NOTE — Progress Notes (Signed)
 Surgical Instructions   Your procedure is scheduled on Friday December 01, 2023. Report to [facility] Main Entrance A at 11:45 A.M., then check in with the Admitting office. Any questions or running late day of surgery: call [phone]  Questions prior to your surgery date: call [phone], Monday-Friday, 8am-4pm. If you experience any cold or flu symptoms such as cough, fever, chills, shortness of breath, etc. between now and your scheduled surgery, please notify us  at the above number.     Remember:  Do not eat after midnight the night before your surgery   You may drink clear liquids until 10:45 the morning of your surgery.   Clear liquids allowed are: Water, Non-Citrus Juices (without pulp), Carbonated Beverages, Clear Tea (no milk, honey, etc.), Black Coffee Only (NO MILK, CREAM OR POWDERED CREAMER of any kind), and Gatorade.    Take these medicines the morning of surgery with A SIP OF WATER  amLODipine  (NORVASC )  pantoprazole  (PROTONIX )  tamsulosin  (FLOMAX )   May take these medicines IF NEEDED: famotidine  (PEPCID )  isosorbide  mononitrate (IMDUR )  nitroGLYCERIN  (NITROSTAT ) If you have to take this medication prior to surgery, please call [phone] and report this to a nurse   PER YOUR CARDIOLOGISTS INSTRUCTIONS, HOLD YOUR clopidogrel  (PLAVIX )  FOR 5 DAYS PRIOR TO SURGERY, WITH THE LAST DOSE BEING 11/25/2023    One week prior to surgery, STOP taking any Aspirin  (unless otherwise instructed by your surgeon) Aleve, Naproxen, Ibuprofen, Motrin, Advil, Goody's, BC's, all herbal medications, fish oil, and non-prescription vitamins.                     Do NOT Smoke (Tobacco/Vaping) for 24 hours prior to your procedure.  If you use a CPAP at night, you may bring your mask/headgear for your overnight stay.   You will be asked to remove any contacts, glasses, piercing's, hearing aid's, dentures/partials prior to surgery. Please bring cases for these items if needed.     Patients discharged the day of surgery will not be allowed to drive home, and someone needs to stay with them for 24 hours.  SURGICAL WAITING ROOM VISITATION Patients may have no more than 2 support people in the waiting area - these visitors may rotate.   Pre-op nurse will coordinate an appropriate time for 1 ADULT support person, who may not rotate, to accompany patient in pre-op.  Children under the age of 46 must have an adult with them who is not the patient and must remain in the main waiting area with an adult.  If the patient needs to stay at the hospital during part of their recovery, the visitor guidelines for inpatient rooms apply.  Please refer to the [facility] website for the visitor guidelines for any additional information.   If you received a COVID test during your pre-op visit  it is requested that you wear a mask when out in public, stay away from anyone that may not be feeling well and notify your surgeon if you develop symptoms. If you have been in contact with anyone that has tested positive in the last 10 days please notify you surgeon.      Pre-operative CHG Bathing Instructions   You can play a key role in reducing the risk of infection after surgery. Your skin needs to be as free of germs as possible. You can reduce the number of germs on your skin by washing with CHG (chlorhexidine  gluconate) soap before surgery. CHG is an antiseptic soap that kills  germs and continues to kill germs even after washing.   DO NOT use if you have an allergy to chlorhexidine /CHG or antibacterial soaps. If your skin becomes reddened or irritated, stop using the CHG and notify one of our RNs at [phone].              TAKE A SHOWER THE NIGHT BEFORE SURGERY   Please keep in mind the following:  You may shave your face before/day of surgery.  Place clean sheets on your bed the night before surgery Use a clean washcloth (not used since being washed) for shower. DO NOT sleep with  pet's night before surgery.  CHG Shower Instructions:  Wash your face and private area with normal soap. If you choose to wash your hair, wash first with your normal shampoo.  After you use shampoo/soap, rinse your hair and body thoroughly to remove shampoo/soap residue.  Turn the water OFF and apply half the bottle of CHG soap to a CLEAN washcloth.  Apply CHG soap ONLY FROM YOUR NECK DOWN TO YOUR TOES (washing for 3-5 minutes)  DO NOT use CHG soap on face, private areas, open wounds, or sores.  Pay special attention to the area where your surgery is being performed.  If you are having back surgery, having someone wash your back for you may be helpful. Wait 2 minutes after CHG soap is applied, then you may rinse off the CHG soap.  Pat dry with a clean towel  Put on clean pajamas    Additional instructions for the day of surgery: If you choose, you may shower the morning of surgery with an antibacterial soap.  DO NOT APPLY any lotions, deodorants or cologne   Do not wear jewelry  Do not bring valuables to the hospital. [facility] is not responsible for valuables/personal belongings. Put on clean/comfortable clothes.  Please brush your teeth.  Ask your nurse before applying any prescription medications to the skin.

## 2023-11-27 NOTE — Progress Notes (Signed)
 Surgical Instructions   Your procedure is scheduled on Friday December 06, 2023. Report to [facility] Main Entrance A at 11:45 A.M., then check in with the Admitting office. Any questions or running late day of surgery: call [phone]  Questions prior to your surgery date: call [phone], Monday-Friday, 8am-4pm. If you experience any cold or flu symptoms such as cough, fever, chills, shortness of breath, etc. between now and your scheduled surgery, please notify us  at the above number.     Remember:  Do not eat after midnight the night before your surgery. No gum, mints, or hard candy.   You may drink clear liquids until 10:45 the morning of your surgery.   Clear liquids allowed are: Water, Non-Citrus Juices (without pulp), Carbonated Beverages, Clear Tea (no milk, honey, etc.), Black Coffee Only (NO MILK, CREAM OR POWDERED CREAMER of any kind), and Gatorade.    Take these medicines the morning of surgery with A SIP OF WATER  amLODipine  (NORVASC )  pantoprazole  (PROTONIX )  tamsulosin  (FLOMAX )   May take these medicines IF NEEDED: famotidine  (PEPCID )  isosorbide  mononitrate (IMDUR )  nitroGLYCERIN  (NITROSTAT ) If you have to take this medication prior to surgery, please call [phone] and report this to a nurse   Follow your surgeon's instructions regarding stopping clopidogrel  (Plavix ) prior to surgery. If no instructions were given, please contact your surgeon's office.    One week prior to surgery, STOP taking any Aspirin  (unless otherwise instructed by your surgeon) Aleve, Naproxen, Ibuprofen, Motrin, Advil, Goody's, BC's, all herbal medications, fish oil, and non-prescription vitamins.                     Do NOT Smoke (Tobacco/Vaping) for 24 hours prior to your procedure.  If you use a CPAP at night, you may bring your mask/headgear for your overnight stay.   You will be asked to remove any contacts, glasses, piercing's, hearing aid's, dentures/partials prior to  surgery. Please bring cases for these items if needed.    Patients discharged the day of surgery will not be allowed to drive home, and someone needs to stay with them for 24 hours.  SURGICAL WAITING ROOM VISITATION Patients may have no more than 2 support people in the waiting area - these visitors may rotate.   Pre-op nurse will coordinate an appropriate time for 1 ADULT support person, who may not rotate, to accompany patient in pre-op.  Children under the age of 13 must have an adult with them who is not the patient and must remain in the main waiting area with an adult.  If the patient needs to stay at the hospital during part of their recovery, the visitor guidelines for inpatient rooms apply.  Please refer to the [facility] website for the visitor guidelines for any additional information.   If you received a COVID test during your pre-op visit  it is requested that you wear a mask when out in public, stay away from anyone that may not be feeling well and notify your surgeon if you develop symptoms. If you have been in contact with anyone that has tested positive in the last 10 days please notify you surgeon.      Pre-operative CHG Bathing Instructions   You can play a key role in reducing the risk of infection after surgery. Your skin needs to be as free of germs as possible. You can reduce the number of germs on your skin by washing with CHG (chlorhexidine  gluconate) soap before surgery. CHG  is an antiseptic soap that kills germs and continues to kill germs even after washing.   DO NOT use if you have an allergy to chlorhexidine /CHG or antibacterial soaps. If your skin becomes reddened or irritated, stop using the CHG and notify one of our RNs at [phone].              TAKE A SHOWER THE NIGHT BEFORE SURGERY   Please keep in mind the following:  You may shave your face before/day of surgery.  Place clean sheets on your bed the night before surgery Use a clean washcloth (not  used since being washed) for shower. DO NOT sleep with pet's night before surgery.  CHG Shower Instructions:  Wash your face and private area with normal soap. If you choose to wash your hair, wash first with your normal shampoo.  After you use shampoo/soap, rinse your hair and body thoroughly to remove shampoo/soap residue.  Turn the water OFF and apply half the bottle of CHG soap to a CLEAN washcloth.  Apply CHG soap ONLY FROM YOUR NECK DOWN TO YOUR TOES (washing for 3-5 minutes)  DO NOT use CHG soap on face, private areas, open wounds, or sores.  Pay special attention to the area where your surgery is being performed.  If you are having back surgery, having someone wash your back for you may be helpful. Wait 2 minutes after CHG soap is applied, then you may rinse off the CHG soap.  Pat dry with a clean towel  Put on clean pajamas    Additional instructions for the day of surgery: If you choose, you may shower the morning of surgery with an antibacterial soap.  DO NOT APPLY any lotions, deodorants or cologne   Do not wear jewelry  Do not bring valuables to the hospital. [facility] is not responsible for valuables/personal belongings. Put on clean/comfortable clothes.  Please brush your teeth.  Ask your nurse before applying any prescription medications to the skin.

## 2023-11-28 ENCOUNTER — Ambulatory Visit (INDEPENDENT_AMBULATORY_CARE_PROVIDER_SITE_OTHER)

## 2023-11-28 ENCOUNTER — Other Ambulatory Visit: Payer: Self-pay

## 2023-11-28 ENCOUNTER — Encounter (HOSPITAL_COMMUNITY)
Admission: RE | Admit: 2023-11-28 | Discharge: 2023-11-28 | Disposition: A | Discharge status: Home / Self Care | Source: Ambulatory Visit

## 2023-11-28 ENCOUNTER — Encounter (INDEPENDENT_AMBULATORY_CARE_PROVIDER_SITE_OTHER): Payer: Self-pay

## 2023-11-28 ENCOUNTER — Encounter (HOSPITAL_COMMUNITY): Payer: Self-pay

## 2023-11-28 ENCOUNTER — Ambulatory Visit: Attending: Internal Medicine

## 2023-11-28 VITALS — BP 124/68 | HR 78

## 2023-11-28 VITALS — BP 127/69 | HR 80 | Temp 97.8°F | Resp 18 | Ht 69.0 in | Wt 172.5 lb

## 2023-11-28 DIAGNOSIS — I2510 Atherosclerotic heart disease of native coronary artery without angina pectoris: Secondary | ICD-10-CM | POA: Diagnosis not present

## 2023-11-28 DIAGNOSIS — I129 Hypertensive chronic kidney disease with stage 1 through stage 4 chronic kidney disease, or unspecified chronic kidney disease: Secondary | ICD-10-CM | POA: Insufficient documentation

## 2023-11-28 DIAGNOSIS — Z87891 Personal history of nicotine dependence: Secondary | ICD-10-CM | POA: Insufficient documentation

## 2023-11-28 DIAGNOSIS — N183 Chronic kidney disease, stage 3 unspecified: Secondary | ICD-10-CM | POA: Insufficient documentation

## 2023-11-28 DIAGNOSIS — K219 Gastro-esophageal reflux disease without esophagitis: Secondary | ICD-10-CM | POA: Diagnosis not present

## 2023-11-28 DIAGNOSIS — Z91198 Patient's noncompliance with other medical treatment and regimen for other reason: Secondary | ICD-10-CM

## 2023-11-28 DIAGNOSIS — Z8673 Personal history of transient ischemic attack (TIA), and cerebral infarction without residual deficits: Secondary | ICD-10-CM | POA: Diagnosis not present

## 2023-11-28 DIAGNOSIS — Z01812 Encounter for preprocedural laboratory examination: Secondary | ICD-10-CM | POA: Diagnosis not present

## 2023-11-28 DIAGNOSIS — Z95828 Presence of other vascular implants and grafts: Secondary | ICD-10-CM | POA: Diagnosis not present

## 2023-11-28 DIAGNOSIS — G4733 Obstructive sleep apnea (adult) (pediatric): Secondary | ICD-10-CM | POA: Diagnosis not present

## 2023-11-28 DIAGNOSIS — Z01818 Encounter for other preprocedural examination: Secondary | ICD-10-CM

## 2023-11-28 DIAGNOSIS — Z789 Other specified health status: Secondary | ICD-10-CM

## 2023-11-28 DIAGNOSIS — Z01810 Encounter for preprocedural cardiovascular examination: Secondary | ICD-10-CM | POA: Diagnosis not present

## 2023-11-28 DIAGNOSIS — Z79899 Other long term (current) drug therapy: Secondary | ICD-10-CM | POA: Diagnosis not present

## 2023-11-28 DIAGNOSIS — I10 Essential (primary) hypertension: Secondary | ICD-10-CM

## 2023-11-28 LAB — BASIC METABOLIC PANEL WITH GFR
Anion gap: 9 (ref 5–15)
BUN: 20 mg/dL (ref 8–23)
CO2: 24 mmol/L (ref 22–32)
Calcium: 9.4 mg/dL (ref 8.9–10.3)
Chloride: 105 mmol/L (ref 98–111)
Creatinine, Ser: 1.53 mg/dL — ABNORMAL HIGH (ref 0.61–1.24)
GFR, Estimated: 46 mL/min — ABNORMAL LOW (ref >60)
Glucose, Bld: 94 mg/dL (ref 70–99)
Potassium: 4.2 mmol/L (ref 3.5–5.1)
Sodium: 138 mmol/L (ref 135–145)

## 2023-11-28 LAB — CBC
HCT: 42.1 % (ref 39.0–52.0)
Hemoglobin: 13.6 g/dL (ref 13.0–17.0)
MCH: 29.6 pg (ref 26.0–34.0)
MCHC: 32.3 g/dL (ref 30.0–36.0)
MCV: 91.5 fL (ref 80.0–100.0)
Platelets: 177 K/uL (ref 150–400)
RBC: 4.6 MIL/uL (ref 4.22–5.81)
RDW: 14 % (ref 11.5–15.5)
WBC: 5.3 K/uL (ref 4.0–10.5)
nRBC: 0 % (ref 0.0–0.2)

## 2023-11-28 NOTE — Progress Notes (Signed)
 Dear Dr. Wendee, Here is my assessment for our mutual patient, John Salazar. Thank you for allowing me the opportunity to care for your patient. Please do not hesitate to contact me should you have any other questions. Sincerely, Dr. Penne Croak  Otolaryngology Clinic Note Referring provider: Dr. Wendee HPI:  Discussed the use of AI scribe software for clinical note transcription with the patient, who gave verbal consent to proceed.  History of Present Illness John Salazar is a 78 year old male with severe sleep apnea who presents for pre-surgical consultation.  Obstructive sleep apnea - Severe sleep apnea present for over 20 years - Characterized by frequent nocturnal awakenings and persistent fatigue - Initial management with B12 supplementation, increased physical activity, and dietary modifications did not improve symptoms - Formal evaluation confirmed severe sleep apnea  Positive airway pressure intolerance - Attempted CPAP therapy was ineffective due to mask displacement during sleep - CPAP use resulted in air blowing into his ear, contributing to intolerance  Antiplatelet therapy management - Currently taking Plavix  (clopidogrel ) - Coordinating with cardiologist regarding perioperative management of antiplatelet therapy in preparation for surgery  Independent Review of Additional Tests or Records:  Reviewed external note from referring PCP, Cable,describing RElevant history incorporated into today's evaluation. I personally reviewed Dr. Vallery clinic and DISE note.   Labs personally reviewed GFR 46, Cr 1.53.  CBC WNL  PMH/Meds/All/SocHx/FamHx/ROS:   Past Medical History:  Diagnosis Date   Anxiety    Ascending aorta dilation 08/22/2021   Echocardiogram 04/2021: 40 mm   Barrett esophagus    BPH (benign prostatic hyperplasia)    Cerebral aneurysm    Chronic kidney disease    stage 2   Colon polyps    Coronary artery disease    mild-mod non-obstructive CAD    Depression    Erectile dysfunction    GERD (gastroesophageal reflux disease)    History of kidney stones    History of stomach ulcers    Hypercholesteremia    Hypertension    Internal carotid artery stenosis, right 06/29/2020   Positive TB test    in the past   Sleep apnea    does not use CPAP     Past Surgical History:  Procedure Laterality Date   APPENDECTOMY  1970   CATARACT EXTRACTION W/ INTRAOCULAR LENS  IMPLANT, BILATERAL     COLONOSCOPY  10/2012   DRUG INDUCED ENDOSCOPY N/A 10/30/2023   Procedure: DRUG INDUCED SLEEP ENDOSCOPY;  Surgeon: Okey Burns, MD;  Location: [facility];  Service: ENT;  Laterality: N/A;   ESOPHAGOGASTRODUODENOSCOPY ENDOSCOPY  10/2012   HERNIA REPAIR     IR 3D INDEPENDENT WKST  04/14/2020   IR ANGIO INTRA EXTRACRAN SEL COM CAROTID INNOMINATE BILAT MOD SED  08/09/2017   IR ANGIO INTRA EXTRACRAN SEL COM CAROTID INNOMINATE BILAT MOD SED  11/08/2018   IR ANGIO INTRA EXTRACRAN SEL COM CAROTID INNOMINATE BILAT MOD SED  04/14/2020   IR ANGIO INTRA EXTRACRAN SEL COM CAROTID INNOMINATE UNI L MOD SED  06/29/2020   IR ANGIO INTRA EXTRACRAN SEL INTERNAL CAROTID UNI L MOD SED  05/20/2020   IR ANGIO VERTEBRAL SEL VERTEBRAL BILAT MOD SED  08/09/2017   IR ANGIO VERTEBRAL SEL VERTEBRAL BILAT MOD SED  11/08/2018   IR ANGIO VERTEBRAL SEL VERTEBRAL BILAT MOD SED  04/14/2020   IR ANGIOGRAM FOLLOW UP STUDY  05/20/2020   IR INTRAVSC STENT CERV CAROTID W/EMB-PROT MOD SED INCL ANGIO  08/23/2017   IR INTRAVSC STENT CERV CAROTID  W/EMB-PROT MOD SED INCL ANGIO  06/29/2020   IR RADIOLOGIST EVAL & MGMT  05/05/2020   IR RADIOLOGIST EVAL & MGMT  06/03/2020   IR RADIOLOGIST EVAL & MGMT  07/15/2020   IR TRANSCATH/EMBOLIZ  05/18/2020   IR US  GUIDE VASC ACCESS RIGHT  04/14/2020   IR US  GUIDE VASC ACCESS RIGHT  05/20/2020   IR US  GUIDE VASC ACCESS RIGHT  06/29/2020   LEFT HEART CATH AND CORONARY ANGIOGRAPHY N/A 11/15/2018   Procedure: LEFT HEART CATH AND CORONARY ANGIOGRAPHY;  Surgeon: Mady Bruckner, MD;  Location: [facility];  Service: Cardiovascular;  Laterality: N/A;   PROSTATE SURGERY  1999   20 years ago, enlarged prostate   RADIOLOGY WITH ANESTHESIA N/A 08/23/2017   Procedure: IR WITH ANESTHESIA STENT PLACEMENT;  Surgeon: Dolphus Carrion, MD;  Location: [facility] OR;  Service: Radiology;  Laterality: N/A;   RADIOLOGY WITH ANESTHESIA N/A 05/18/2020   Procedure: IR WITH ANESTHESIA EMBOLIZATION;  Surgeon: Dolphus Carrion, MD;  Location: [facility] OR;  Service: Radiology;  Laterality: N/A;   RADIOLOGY WITH ANESTHESIA Left 06/29/2020   Procedure: RADIOLOGY WITH ANESTHESIA  LEFT CAROTID STENT PLACEMENT;  Surgeon: Dolphus Carrion, MD;  Location: [facility] OR;  Service: Radiology;  Laterality: Left;    Family History  Problem Relation Age of Onset   Stroke Mother 56   Aneurysm Father 51     Social Connections: Socially Integrated (08/22/2023)   Social Connection and Isolation Panel    Frequency of Communication with Friends and Family: More than three times a week    Frequency of Social Gatherings with Friends and Family: More than three times a week    Attends Religious Services: More than 4 times per year    Active Member of [employer] or Organizations: Yes    Attends Engineer, structural: More than 4 times per year    Marital Status: Married      Current Outpatient Medications:    amLODipine  (NORVASC ) 5 MG tablet, Take 1 tablet (5 mg total) by mouth 2 (two) times daily., Disp: , Rfl:    clopidogrel  (PLAVIX ) 75 MG tablet, TAKE ONE TABLET BY MOUTH DAILY, Disp: 90 tablet, Rfl: 3   escitalopram  (LEXAPRO ) 20 MG tablet, Take 1 tablet (20 mg total) by mouth daily. (Patient taking differently: Take 20 mg by mouth at bedtime.), Disp: 90 tablet, Rfl: 3   famotidine  (PEPCID ) 20 MG tablet, Take 20 mg by mouth daily as needed for heartburn or indigestion., Disp: , Rfl:    isosorbide  mononitrate (IMDUR ) 30 MG 24 hr tablet, Take 0.5 tablets (15 mg total) by mouth daily. AS NEEDED  (Patient taking differently: Take 15 mg by mouth daily as needed (for fatigue, to increase blood flow.).), Disp: 30 tablet, Rfl: 6   nitroGLYCERIN  (NITROSTAT ) 0.4 MG SL tablet, Place 1 tablet (0.4 mg total) under the tongue every 5 (five) minutes as needed for chest pain., Disp: 25 tablet, Rfl: prn   pantoprazole  (PROTONIX ) 40 MG tablet, Take 1 tablet (40 mg total) by mouth 2 (two) times daily., Disp: 180 tablet, Rfl: 3   rosuvastatin  (CRESTOR ) 40 MG tablet, Take one (1) tablet by mouth (40 mg) daily. (Patient taking differently: Take 40 mg by mouth every evening.), Disp: 90 tablet, Rfl: 3   tamsulosin  (FLOMAX ) 0.4 MG CAPS capsule, Take one capsule once daily ( thirty minutes after same meal daily) (Patient taking differently: Take one capsule once daily ( thirty minutes after same meal daily) nightly), Disp: , Rfl:  Physical Exam:   BP 124/68   Pulse 78   SpO2 97%   The patient was awake, alert, and appropriate. The external ears were inspected, and otoscopy was performed to evaluate the external auditory canals and tympanic membranes. The nasal cavity and septum were examined for mucosal changes, obstruction, or discharge. The oral cavity and oropharynx were inspected for mucosal lesions, infection, or tonsillar hypertrophy. The neck was palpated for lymphadenopathy, thyroid  abnormalities, or other masses. Cranial nerve function was grossly intact.  Pertinent Findings: Physical Exam HEENT: Ears normal, nose normal, oral cavity normal. Mallampati III. Small tonsils.Neck with multiple rhytids.   Impression & Plans:  John Salazar is a 78 y.o. male  1. OSA (obstructive sleep apnea)   2. Intolerance of continuous positive airway pressure (CPAP) ventilation     - Findings and diagnoses discussed in detail with the patient. - Risks, benefits, and alternatives were reviewed. Through shared decision making, the patient elects to proceed with below. Assessment & Plan Severe obstructive sleep  apnea requiring hypoglossal nerve stimulator implantation Severe obstructive sleep apnea exacerbated by anatomical factors. CPAP therapy failed. Hypoglossal nerve stimulator implantation indicated. Explained procedure, risks, and benefits. Patient accepted. - Proceed with hypoglossal nerve stimulator implantation. - Coordinate with cardiologist for anticoagulation management prior to surgery. - Confirm insurance coverage and deductible prior to surgery. - Schedule follow-up visit one to two weeks post-surgery to assess incision healing. - Educate on the use of the stimulator, including turning it on at night   - Orders placed: Pt agrees to Hypoglossal nerve stimulator in the OR Orders Placed This Encounter  Procedures   Ambulatory Referral For Surgery Scheduling   - Medications prescribed/continued/adjusted: No orders of the defined types were placed in this encounter.  - Education materials provided to the patient. - Follow up: schedule at mutual convenience. Patient instructed to return sooner or go to the ED if new/worsening symptoms develop.  Thank you for allowing me the opportunity to care for your patient. Please do not hesitate to contact me should you have any other questions.  Sincerely, Penne Croak, DO Otolaryngologist (ENT) [facility] Phone: [phone] Fax: [phone]  11/28/2023, 5:09 PM

## 2023-11-28 NOTE — Progress Notes (Signed)
 Virtual Visit via Telephone Note   Because of John Salazar co-morbid illnesses, he is at least at moderate risk for complications without adequate follow up.  This format is felt to be most appropriate for this patient at this time.  Due to technical limitations with video connection (technology), today's appointment will be conducted as an audio only telehealth visit, and John Salazar verbally agreed to proceed in this manner.   All issues noted in this document were discussed and addressed.  No physical exam could be performed with this format.  Evaluation Performed:  Preoperative cardiovascular risk assessment _____________   Date:  11/28/2023   Patient ID:  John Salazar, DOB [birth year], MRN [id number] Patient Location:  Home Provider location:   Office  Primary Care Provider:  Wendee Lynwood HERO, NP Primary Cardiologist:  Vina Gull, MD  Chief Complaint / Patient Profile  78 y.o. y/o male with a h/o CAD, hypertension and carotid artery disease who is pending inspire implant and presents today for telephonic preoperative cardiovascular risk assessment. History of Present Illness  John Salazar is a 78 y.o. male who presents via audio/video conferencing for a telehealth visit today.  Pt was last seen in cardiology clinic on 02/14/2023 by Dr. Vina Gull.  At that time John Salazar was doing well.  The patient is now pending procedure as outlined above. Since his last visit, he has remained stable from cardiac standpoint. Today he denies chest pain, shortness of breath, lower extremity edema, fatigue, palpitations, melena, hematuria, hemoptysis, diaphoresis, weakness, presyncope, syncope, orthopnea, and PND. He is able to achieve greater than 4 METs of activity.  Past Medical History    Past Medical History:  Diagnosis Date   Anxiety    Ascending aorta dilation 08/22/2021   Echocardiogram 04/2021: 40 mm   Barrett esophagus    BPH (benign prostatic hyperplasia)    Cerebral aneurysm     Chronic kidney disease    stage 2   Colon polyps    Coronary artery disease    mild-mod non-obstructive CAD   Depression    Erectile dysfunction    GERD (gastroesophageal reflux disease)    History of kidney stones    History of stomach ulcers    Hypercholesteremia    Hypertension    Internal carotid artery stenosis, right 06/29/2020   Positive TB test    in the past   Sleep apnea    does not use CPAP   Past Surgical History:  Procedure Laterality Date   APPENDECTOMY  1970   CATARACT EXTRACTION W/ INTRAOCULAR LENS  IMPLANT, BILATERAL     COLONOSCOPY  10/2012   DRUG INDUCED ENDOSCOPY N/A 10/30/2023   Procedure: DRUG INDUCED SLEEP ENDOSCOPY;  Surgeon: Okey Burns, MD;  Location: [facility];  Service: ENT;  Laterality: N/A;   ESOPHAGOGASTRODUODENOSCOPY ENDOSCOPY  10/2012   HERNIA REPAIR     IR 3D INDEPENDENT WKST  04/14/2020   IR ANGIO INTRA EXTRACRAN SEL COM CAROTID INNOMINATE BILAT MOD SED  08/09/2017   IR ANGIO INTRA EXTRACRAN SEL COM CAROTID INNOMINATE BILAT MOD SED  11/08/2018   IR ANGIO INTRA EXTRACRAN SEL COM CAROTID INNOMINATE BILAT MOD SED  04/14/2020   IR ANGIO INTRA EXTRACRAN SEL COM CAROTID INNOMINATE UNI L MOD SED  06/29/2020   IR ANGIO INTRA EXTRACRAN SEL INTERNAL CAROTID UNI L MOD SED  05/20/2020   IR ANGIO VERTEBRAL SEL VERTEBRAL BILAT MOD SED  08/09/2017   IR ANGIO VERTEBRAL SEL VERTEBRAL  BILAT MOD SED  11/08/2018   IR ANGIO VERTEBRAL SEL VERTEBRAL BILAT MOD SED  04/14/2020   IR ANGIOGRAM FOLLOW UP STUDY  05/20/2020   IR INTRAVSC STENT CERV CAROTID W/EMB-PROT MOD SED INCL ANGIO  08/23/2017   IR INTRAVSC STENT CERV CAROTID W/EMB-PROT MOD SED INCL ANGIO  06/29/2020   IR RADIOLOGIST EVAL & MGMT  05/05/2020   IR RADIOLOGIST EVAL & MGMT  06/03/2020   IR RADIOLOGIST EVAL & MGMT  07/15/2020   IR TRANSCATH/EMBOLIZ  05/18/2020   IR US  GUIDE VASC ACCESS RIGHT  04/14/2020   IR US  GUIDE VASC ACCESS RIGHT  05/20/2020   IR US  GUIDE VASC ACCESS RIGHT  06/29/2020   LEFT HEART  CATH AND CORONARY ANGIOGRAPHY N/A 11/15/2018   Procedure: LEFT HEART CATH AND CORONARY ANGIOGRAPHY;  Surgeon: Mady Bruckner, MD;  [facility]: [facility];  Service: Cardiovascular;  Laterality: N/A;   PROSTATE SURGERY  1999   20 years ago, enlarged prostate   RADIOLOGY WITH ANESTHESIA N/A 08/23/2017   Procedure: IR WITH ANESTHESIA STENT PLACEMENT;  Surgeon: Dolphus Carrion, MD;  Location: [facility];  Service: Radiology;  Laterality: N/A;   RADIOLOGY WITH ANESTHESIA N/A 05/18/2020   Procedure: IR WITH ANESTHESIA EMBOLIZATION;  Surgeon: Dolphus Carrion, MD;  Location: [facility];  Service: Radiology;  Laterality: N/A;   RADIOLOGY WITH ANESTHESIA Left 06/29/2020   Procedure: RADIOLOGY WITH ANESTHESIA  LEFT CAROTID STENT PLACEMENT;  Surgeon: Dolphus Carrion, MD;  Location: [facility];  Service: Radiology;  Laterality: Left;   Allergies Allergies  Allergen Reactions   Bee Venom Anaphylaxis and Shortness Of Breath   Penicillins Swelling and Rash    As a child    Home Medications    Prior to Admission medications   Medication Sig Start Date End Date Taking? Authorizing Provider  amLODipine  (NORVASC ) 5 MG tablet Take 1 tablet (5 mg total) by mouth 2 (two) times daily. 11/21/23   Okey Vina GAILS, MD  clopidogrel  (PLAVIX ) 75 MG tablet TAKE ONE TABLET BY MOUTH DAILY 06/21/23   Okey Vina GAILS, MD  escitalopram  (LEXAPRO ) 20 MG tablet Take 1 tablet (20 mg total) by mouth daily. Patient taking differently: Take 20 mg by mouth at bedtime. 03/23/23   Dugal, Tabitha, FNP  famotidine  (PEPCID ) 20 MG tablet Take 20 mg by mouth daily as needed for heartburn or indigestion.    [provider]  isosorbide  mononitrate (IMDUR ) 30 MG 24 hr tablet Take 0.5 tablets (15 mg total) by mouth daily. AS NEEDED Patient taking differently: Take 15 mg by mouth daily as needed (for fatigue, to increase blood flow.). 02/14/23   Okey Vina GAILS, MD  nitroGLYCERIN  (NITROSTAT ) 0.4 MG SL tablet Place 1 tablet (0.4 mg total) under  the tongue every 5 (five) minutes as needed for chest pain. 02/14/23   Okey Vina GAILS, MD  pantoprazole  (PROTONIX ) 40 MG tablet Take 1 tablet (40 mg total) by mouth 2 (two) times daily. 10/13/23   Stacia Glendia BRAVO, MD  rosuvastatin  (CRESTOR ) 40 MG tablet Take one (1) tablet by mouth (40 mg) daily. Patient taking differently: Take 40 mg by mouth every evening. 12/22/22   Okey Vina GAILS, MD  tamsulosin  (FLOMAX ) 0.4 MG CAPS capsule Take one capsule once daily ( thirty minutes after same meal daily) Patient taking differently: Take one capsule once daily ( thirty minutes after same meal daily) nightly 11/28/22   Okey Vina GAILS, MD   Physical Exam  Vital Signs:  JASHUN PUERTAS does not have vital  signs available for review today. Given telephonic nature of communication, physical exam is limited. AAOx3. NAD. Normal affect.  Speech and respirations are unlabored. Accessory Clinical Findings   None Assessment & Plan  1.  Preoperative Cardiovascular Risk Assessment: Mr. Hedgecock's perioperative risk of a major cardiac event is 0.9% according to the Revised Cardiac Risk Index (RCRI).  Therefore, he is at low risk for perioperative complications.   His functional capacity is good at 7.34 METs according to the Duke Activity Status Index (DASI). Recommendations: According to ACC/AHA guidelines, no further cardiovascular testing needed.  The patient may proceed to surgery at acceptable risk.   Antiplatelet and/or Anticoagulation Recommendations: Clopidogrel  (Plavix ) can be held for 5 days prior to his surgery and resumed as soon as possible post op.  The patient was advised that if he develops new symptoms prior to surgery to contact our office to arrange for a follow-up visit, and he verbalized understanding.  A copy of this note will be routed to requesting surgeon.  Time:   Today, I have spent 10 minutes with the patient with telehealth technology discussing medical history, symptoms, and management plan.     Promise Bushong D Zareth Rippetoe, NP  11/28/2023, 3:31 PM

## 2023-11-28 NOTE — Progress Notes (Signed)
 PCP - Wendee Lynwood HERO, NP  Cardiologist - Vina Gull, MD- pt to have telephone visit for clearance this afternoon  PPM/ICD - denies    Chest x-ray - 10/23/23 EKG - 08/15/23 Stress Test - pt reports attempting stress test over 20 years ago. Pt reports not being able to finish treadmill test ECHO - 08/15/23 Cardiac Cath -11/15/18- pt denies having any cardiac stents placed  Sleep Study - 09/21/22   Fasting Blood Sugar - N/A  Last dose of GLP1 agonist-  N/A  Blood Thinner Instructions: Plavix - pt reports he is unsure about when to hold plavix , as Dr. Anice will now be doing his surgery instead of Dr Ferdinand. Pt reports he will ask Dr. Anice and Dr Gull about this today.  Aspirin  Instructions: NA  ERAS Protcol - per protocol- no orders by MD   COVID TEST- N/A   Anesthesia review: yes- pt needs cardiac clearance- to have telephone visit today  Patient denies shortness of breath, fever, cough and chest pain at PAT appointment   All instructions explained to the patient, with a verbal understanding of the material. Patient agrees to go over the instructions while at home for a better understanding. The opportunity to ask questions was provided.

## 2023-11-29 NOTE — Anesthesia Preprocedure Evaluation (Addendum)
 Anesthesia Evaluation  Patient identified by MRN, date of birth, ID band Patient awake    Reviewed: Allergy & Precautions, NPO status , Patient's Chart, lab work & pertinent test results  Airway Mallampati: II  TM Distance: >3 FB Neck ROM: Full    Dental  (+) Teeth Intact, Dental Advisory Given   Pulmonary sleep apnea , former smoker   Pulmonary exam normal breath sounds clear to auscultation       Cardiovascular hypertension, Pt. on medications (-) angina + CAD and + Peripheral Vascular Disease  (-) Past MI Normal cardiovascular exam+ dysrhythmias (RBBB)  Rhythm:Regular Rate:Normal     Neuro/Psych  PSYCHIATRIC DISORDERS Anxiety Depression    s/p embolization of wide necked left ICA aneurysm 05/2020 TIA   GI/Hepatic Neg liver ROS,GERD  Medicated and Controlled,,  Endo/Other  negative endocrine ROS    Renal/GU Renal InsufficiencyRenal disease     Musculoskeletal negative musculoskeletal ROS (+)    Abdominal   Peds  Hematology negative hematology ROS (+)   Anesthesia Other Findings Day of surgery medications reviewed with the patient.  Reproductive/Obstetrics                              Anesthesia Physical Anesthesia Plan  ASA: 3  Anesthesia Plan: General   Post-op Pain Management: Tylenol  PO (pre-op)*   Induction: Intravenous  PONV Risk Score and Plan: 3  Airway Management Planned: Oral ETT  Additional Equipment:   Intra-op Plan:   Post-operative Plan: Extubation in OR  Informed Consent: I have reviewed the patients History and Physical, chart, labs and discussed the procedure including the risks, benefits and alternatives for the proposed anesthesia with the patient or authorized representative who has indicated his/her understanding and acceptance.     Dental advisory given  Plan Discussed with: CRNA  Anesthesia Plan Comments: (PAT note by Lynwood Hope,  PA-C: 78 year old male follows with cardiology for history of CAD by cath 2020 (80% ostial OM1, 70% distal LCx/OM 2 not well-suited to PCI, otherwise mild to moderate nonobstructive disease, medical therapy recommended), HTN, carotid artery disease s/p bilateral stents (patent by CTA 08/2023).  Seen by Katlyn West, NP on 11/28/2023 for preop evaluation.  Per note, Mr. Ballinger's perioperative risk of a major cardiac event is 0.9% according to the Revised Cardiac Risk Index (RCRI).  Therefore, he is at low risk for perioperative complications.   His functional capacity is good at 7.34 METs according to the Duke Activity Status Index (DASI). Recommendations: According to ACC/AHA guidelines, no further cardiovascular testing needed.  The patient may proceed to surgery at acceptable risk. Antiplatelet and/or Anticoagulation Recommendations: Clopidogrel  (Plavix ) can be held for 5 days prior to his surgery and resumed as soon as possible post op.  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  Preop labs reviewed, creatinine mildly elevated 1.53 consistent with history of CKD, otherwise WNL.  EKG 08/14/2023: NSR.  Rate 76.  CTA head neck 08/14/2023: IMPRESSION: Bilateral common/internal carotid artery stents in the neck and stent extending from the left supraclinoid ICA to left M1 segment are patent.   No large vessel occlusion. No high-grade stenosis, dissection, or aneurysm.   No CT evidence of acute intracranial abnormality.   Mild atherosclerosis as above.   Aortic Atherosclerosis (ICD10-I70.0) and Emphysema (ICD10-J43.9).   TTE 08/15/2023: 1. Left ventricular ejection  fraction, by estimation, is 60 to 65%. Left  ventricular ejection fraction by 2D MOD biplane is 60.1 %. The left  ventricle has normal function. The left ventricle has no regional wall  motion  abnormalities. There is mild  concentric left ventricular hypertrophy. Left ventricular diastolic  parameters are consistent with Grade I diastolic dysfunction (impaired  relaxation).   2. Right ventricular systolic function is normal. The right ventricular  size is normal.   3. A small pericardial effusion is present.   4. The mitral valve is normal in structure. No evidence of mitral valve  regurgitation. No evidence of mitral stenosis.   5. The aortic valve is tricuspid. Aortic valve regurgitation is not  visualized. Aortic valve sclerosis is present, with no evidence of aortic  valve stenosis.   Cath 11/15/2018: Conclusions: 1. Severe single-vessel coronary artery disease involving a branch of OM1 (80% ostial stenosis) and distal LCx/OM2 (70%).  The affected vessels/branches are small and not well-suited to PCI (vessel size 2 mm or less). 2. Mild to moderate, non-obstructive coronary artery disease involving the LMCA, LAD, and RCA. 3. Normal left ventricular filling pressure.   Recommendations: 1. Aggressive medical therapy and secondary prevention.  I will add isosorbide  mononitrate 15 mg daily, to be escalated as tolerated. 2. Continue antiplatelet and statin therapy. 3. Close outpatient follow-up with Dr. Cleone.  )         Anesthesia Quick Evaluation

## 2023-11-29 NOTE — Progress Notes (Signed)
 Anesthesia Chart Review:  78 year old male follows with cardiology for history of CAD by cath 2020 (80% ostial OM1, 70% distal LCx/OM 2 not well-suited to PCI, otherwise mild to moderate nonobstructive disease, medical therapy recommended), HTN, carotid artery disease s/p bilateral stents (patent by CTA 08/2023).  Seen by Katlyn West, NP on 11/28/2023 for preop evaluation.  Per note, Mr. Artus's perioperative risk of a major cardiac event is 0.9% according to the Revised Cardiac Risk Index (RCRI).  Therefore, he is at low risk for perioperative complications.   His functional capacity is good at 7.34 METs according to the Duke Activity Status Index (DASI). Recommendations: According to ACC/AHA guidelines, no further cardiovascular testing needed.  The patient may proceed to surgery at acceptable risk. Antiplatelet and/or Anticoagulation Recommendations: Clopidogrel  (Plavix ) can be held for 5 days prior to his surgery and resumed as soon as possible post op.  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  Preop labs reviewed, creatinine mildly elevated 1.53 consistent with history of CKD, otherwise WNL.  EKG 08/14/2023: NSR.  Rate 76.  CTA head neck 08/14/2023: IMPRESSION: Bilateral common/internal carotid artery stents in the neck and stent extending from the left supraclinoid ICA to left M1 segment are patent.   No large vessel occlusion. No high-grade stenosis, dissection, or aneurysm.   No CT evidence of acute intracranial abnormality.   Mild atherosclerosis as above.   Aortic Atherosclerosis (ICD10-I70.0) and Emphysema (ICD10-J43.9).   TTE 08/15/2023: 1. Left ventricular ejection fraction, by estimation, is 60 to 65%. Left  ventricular ejection fraction by 2D MOD biplane is 60.1 %. The left  ventricle has normal function. The left ventricle has no regional wall   motion abnormalities. There is mild  concentric left ventricular hypertrophy. Left ventricular diastolic  parameters are consistent with Grade I diastolic dysfunction (impaired  relaxation).   2. Right ventricular systolic function is normal. The right ventricular  size is normal.   3. A small pericardial effusion is present.   4. The mitral valve is normal in structure. No evidence of mitral valve  regurgitation. No evidence of mitral stenosis.   5. The aortic valve is tricuspid. Aortic valve regurgitation is not  visualized. Aortic valve sclerosis is present, with no evidence of aortic  valve stenosis.   Cath 11/15/2018: Conclusions: Severe single-vessel coronary artery disease involving a branch of OM1 (80% ostial stenosis) and distal LCx/OM2 (70%).  The affected vessels/branches are small and not well-suited to PCI (vessel size 2 mm or less). Mild to moderate, non-obstructive coronary artery disease involving the LMCA, LAD, and RCA. Normal left ventricular filling pressure.   Recommendations: Aggressive medical therapy and secondary prevention.  I will add isosorbide  mononitrate 15 mg daily, to be escalated as tolerated. Continue antiplatelet and statin therapy. Close outpatient follow-up with Dr. Cleone.    Lynwood Geofm RIGGERS [facility] Short Stay Center/Anesthesiology Phone [phone] 11/29/2023 4:13 PM

## 2023-12-06 ENCOUNTER — Encounter (HOSPITAL_COMMUNITY): Admission: RE | Disposition: A | Discharge status: Home / Self Care | Payer: Self-pay | Source: Home / Self Care

## 2023-12-06 ENCOUNTER — Ambulatory Visit (HOSPITAL_COMMUNITY): Payer: Self-pay | Admitting: Anesthesiology

## 2023-12-06 ENCOUNTER — Ambulatory Visit (HOSPITAL_COMMUNITY)

## 2023-12-06 ENCOUNTER — Ambulatory Visit (HOSPITAL_COMMUNITY): Payer: Self-pay | Admitting: Physician Assistant

## 2023-12-06 ENCOUNTER — Ambulatory Visit (HOSPITAL_COMMUNITY): Admission: RE | Admit: 2023-12-06 | Discharge: 2023-12-06 | Disposition: A | Discharge status: Home / Self Care

## 2023-12-06 ENCOUNTER — Encounter (HOSPITAL_COMMUNITY): Payer: Self-pay

## 2023-12-06 DIAGNOSIS — I129 Hypertensive chronic kidney disease with stage 1 through stage 4 chronic kidney disease, or unspecified chronic kidney disease: Secondary | ICD-10-CM | POA: Diagnosis not present

## 2023-12-06 DIAGNOSIS — K219 Gastro-esophageal reflux disease without esophagitis: Secondary | ICD-10-CM | POA: Insufficient documentation

## 2023-12-06 DIAGNOSIS — J95811 Postprocedural pneumothorax: Secondary | ICD-10-CM | POA: Diagnosis not present

## 2023-12-06 DIAGNOSIS — Z87891 Personal history of nicotine dependence: Secondary | ICD-10-CM | POA: Diagnosis not present

## 2023-12-06 DIAGNOSIS — Z6825 Body mass index (BMI) 25.0-25.9, adult: Secondary | ICD-10-CM | POA: Diagnosis not present

## 2023-12-06 DIAGNOSIS — I2510 Atherosclerotic heart disease of native coronary artery without angina pectoris: Secondary | ICD-10-CM | POA: Diagnosis not present

## 2023-12-06 DIAGNOSIS — N183 Chronic kidney disease, stage 3 unspecified: Secondary | ICD-10-CM | POA: Diagnosis not present

## 2023-12-06 DIAGNOSIS — N189 Chronic kidney disease, unspecified: Secondary | ICD-10-CM | POA: Insufficient documentation

## 2023-12-06 DIAGNOSIS — G4730 Sleep apnea, unspecified: Secondary | ICD-10-CM | POA: Diagnosis not present

## 2023-12-06 DIAGNOSIS — E663 Overweight: Secondary | ICD-10-CM | POA: Diagnosis not present

## 2023-12-06 DIAGNOSIS — Z91198 Patient's noncompliance with other medical treatment and regimen for other reason: Secondary | ICD-10-CM | POA: Diagnosis not present

## 2023-12-06 DIAGNOSIS — G4733 Obstructive sleep apnea (adult) (pediatric): Secondary | ICD-10-CM | POA: Insufficient documentation

## 2023-12-06 DIAGNOSIS — J9811 Atelectasis: Secondary | ICD-10-CM | POA: Diagnosis not present

## 2023-12-06 HISTORY — PX: IMPLANTATION OF HYPOGLOSSAL NERVE STIMULATOR: SHX6827

## 2023-12-06 SURGERY — INSERTION, HYPOGLOSSAL NERVE STIMULATOR
Anesthesia: General

## 2023-12-06 MED ORDER — VASOPRESSIN 20 UNIT/ML IV SOLN
INTRAVENOUS | Status: AC
  Filled 2023-12-06: qty 1

## 2023-12-06 MED ORDER — FENTANYL CITRATE (PF) 100 MCG/2ML IJ SOLN
INTRAMUSCULAR | Status: AC
  Filled 2023-12-06: qty 2

## 2023-12-06 MED ORDER — OXYCODONE HCL 5 MG PO TABS: 5.0000 mg | ORAL_TABLET | Freq: Four times a day (QID) | ORAL | 0 refills | Status: AC | PRN

## 2023-12-06 MED ORDER — ORAL CARE MOUTH RINSE: 15.0000 mL | Freq: Once | OROMUCOSAL | Status: AC

## 2023-12-06 MED ORDER — VASOPRESSIN 20 UNIT/ML IV SOLN
INTRAVENOUS | Status: DC | PRN
  Administered 2023-12-06: 1.5 [IU] via INTRAVENOUS
  Administered 2023-12-06: 1 [IU] via INTRAVENOUS
  Administered 2023-12-06: .5 [IU] via INTRAVENOUS
  Administered 2023-12-06 (×2): 1 [IU] via INTRAVENOUS

## 2023-12-06 MED ORDER — ACETAMINOPHEN 500 MG PO TABS
1000.0000 mg | ORAL_TABLET | Freq: Once | ORAL | Status: AC
  Administered 2023-12-06: 1000 mg via ORAL
  Filled 2023-12-06: qty 2

## 2023-12-06 MED ORDER — CLINDAMYCIN HCL 300 MG PO CAPS: 300.0000 mg | ORAL_CAPSULE | Freq: Three times a day (TID) | ORAL | 0 refills | Status: AC

## 2023-12-06 MED ORDER — PHENYLEPHRINE 80 MCG/ML (10ML) SYRINGE FOR IV PUSH (FOR BLOOD PRESSURE SUPPORT)
PREFILLED_SYRINGE | INTRAVENOUS | Status: DC | PRN
  Administered 2023-12-06: 80 ug via INTRAVENOUS
  Administered 2023-12-06: 160 ug via INTRAVENOUS

## 2023-12-06 MED ORDER — DEXAMETHASONE SOD PHOSPHATE PF 10 MG/ML IJ SOLN
INTRAMUSCULAR | Status: DC | PRN
  Administered 2023-12-06: 5 mg via INTRAVENOUS

## 2023-12-06 MED ORDER — DROPERIDOL 2.5 MG/ML IJ SOLN
0.6250 mg | Freq: Once | INTRAMUSCULAR | Status: AC
  Administered 2023-12-06: 0.625 mg via INTRAVENOUS

## 2023-12-06 MED ORDER — LIDOCAINE-EPINEPHRINE 1 %-1:100000 IJ SOLN
INTRAMUSCULAR | Status: AC
  Filled 2023-12-06: qty 1

## 2023-12-06 MED ORDER — SUCCINYLCHOLINE CHLORIDE 200 MG/10ML IV SOSY
PREFILLED_SYRINGE | INTRAVENOUS | Status: DC | PRN
  Administered 2023-12-06: 120 mg via INTRAVENOUS
  Administered 2023-12-06: 40 mg via INTRAVENOUS

## 2023-12-06 MED ORDER — LIDOCAINE-EPINEPHRINE 1 %-1:100000 IJ SOLN
INTRAMUSCULAR | Status: DC | PRN
  Administered 2023-12-06: 6 mL via INTRADERMAL

## 2023-12-06 MED ORDER — FENTANYL CITRATE (PF) 100 MCG/2ML IJ SOLN
25.0000 ug | INTRAMUSCULAR | Status: DC | PRN
  Administered 2023-12-06 (×2): 25 ug via INTRAVENOUS

## 2023-12-06 MED ORDER — LACTATED RINGERS IV SOLN: INTRAVENOUS | Status: DC

## 2023-12-06 MED ORDER — CLINDAMYCIN PHOSPHATE 900 MG/50ML IV SOLN
900.0000 mg | INTRAVENOUS | Status: AC
  Administered 2023-12-06: 900 mg via INTRAVENOUS
  Filled 2023-12-06: qty 50

## 2023-12-06 MED ORDER — SUCCINYLCHOLINE CHLORIDE 200 MG/10ML IV SOSY
PREFILLED_SYRINGE | INTRAVENOUS | Status: AC
  Filled 2023-12-06: qty 10

## 2023-12-06 MED ORDER — ONDANSETRON HCL 4 MG/2ML IJ SOLN
INTRAMUSCULAR | Status: DC | PRN
  Administered 2023-12-06: 4 mg via INTRAVENOUS

## 2023-12-06 MED ORDER — FENTANYL CITRATE (PF) 250 MCG/5ML IJ SOLN
INTRAMUSCULAR | Status: DC | PRN
  Administered 2023-12-06: 50 ug via INTRAVENOUS
  Administered 2023-12-06 (×2): 100 ug via INTRAVENOUS

## 2023-12-06 MED ORDER — PROPOFOL 10 MG/ML IV BOLUS
INTRAVENOUS | Status: DC | PRN
  Administered 2023-12-06: 140 mg via INTRAVENOUS
  Administered 2023-12-06: 60 mg via INTRAVENOUS
  Administered 2023-12-06: 30 mg via INTRAVENOUS

## 2023-12-06 MED ORDER — ONDANSETRON HCL 4 MG/2ML IJ SOLN
INTRAMUSCULAR | Status: AC
  Filled 2023-12-06: qty 2

## 2023-12-06 MED ORDER — DROPERIDOL 2.5 MG/ML IJ SOLN
INTRAMUSCULAR | Status: AC
  Filled 2023-12-06: qty 2

## 2023-12-06 MED ORDER — CHLORHEXIDINE GLUCONATE 0.12 % MT SOLN
15.0000 mL | Freq: Once | OROMUCOSAL | Status: AC
  Administered 2023-12-06: 15 mL via OROMUCOSAL
  Filled 2023-12-06: qty 15

## 2023-12-06 MED ORDER — EPHEDRINE SULFATE-NACL 50-0.9 MG/10ML-% IV SOSY
PREFILLED_SYRINGE | INTRAVENOUS | Status: DC | PRN
  Administered 2023-12-06 (×2): 10 mg via INTRAVENOUS
  Administered 2023-12-06: 5 mg via INTRAVENOUS

## 2023-12-06 MED ORDER — ROCURONIUM BROMIDE 10 MG/ML (PF) SYRINGE
PREFILLED_SYRINGE | INTRAVENOUS | Status: AC
  Filled 2023-12-06: qty 10

## 2023-12-06 MED ORDER — FENTANYL CITRATE (PF) 250 MCG/5ML IJ SOLN
INTRAMUSCULAR | Status: AC
  Filled 2023-12-06: qty 5

## 2023-12-06 MED ORDER — ALBUMIN HUMAN 5 % IV SOLN: INTRAVENOUS | Status: DC | PRN

## 2023-12-06 MED ORDER — LIDOCAINE 2% (20 MG/ML) 5 ML SYRINGE
INTRAMUSCULAR | Status: DC | PRN
  Administered 2023-12-06: 100 mg via INTRAVENOUS

## 2023-12-06 MED ORDER — PROPOFOL 10 MG/ML IV BOLUS
INTRAVENOUS | Status: AC
  Filled 2023-12-06: qty 20

## 2023-12-06 MED ORDER — LIDOCAINE 2% (20 MG/ML) 5 ML SYRINGE
INTRAMUSCULAR | Status: AC
  Filled 2023-12-06: qty 5

## 2023-12-06 MED ORDER — PHENYLEPHRINE HCL-NACL 20-0.9 MG/250ML-% IV SOLN
INTRAVENOUS | Status: DC | PRN
  Administered 2023-12-06: 30 ug/min via INTRAVENOUS

## 2023-12-06 MED ORDER — SODIUM CHLORIDE 0.9 % IV SOLN
0.1500 ug/kg/min | INTRAVENOUS | Status: AC
  Administered 2023-12-06: .05 ug/kg/min via INTRAVENOUS
  Filled 2023-12-06: qty 2000

## 2023-12-06 MED ORDER — ONDANSETRON HCL 4 MG/2ML IJ SOLN: 4.0000 mg | Freq: Once | INTRAMUSCULAR | Status: DC | PRN

## 2023-12-06 SURGICAL SUPPLY — 57 items
BAG COUNTER SPONGE SURGICOUNT (BAG) ×2 IMPLANT
BLADE CLIPPER SURG (BLADE) IMPLANT
BLADE SURG 15 STRL LF DISP TIS (BLADE) ×2 IMPLANT
CORD BIPOLAR FORCEPS 12FT (ELECTRODE) ×2 IMPLANT
COVER PROBE W GEL 5X96 (DRAPES) ×2 IMPLANT
COVER SURGICAL LIGHT HANDLE (MISCELLANEOUS) ×2 IMPLANT
DERMABOND ADVANCED .7 DNX12 (GAUZE/BANDAGES/DRESSINGS) ×2 IMPLANT
DRAPE C-ARM 35X43 STRL (DRAPES) ×2 IMPLANT
DRAPE HEAD BAR (DRAPES) ×2 IMPLANT
DRAPE INCISE IOBAN 66X45 STRL (DRAPES) ×2 IMPLANT
DRAPE MICROSCOPE LEICA 54X105 (DRAPES) ×2 IMPLANT
DRAPE UTILITY XL STRL (DRAPES) ×2 IMPLANT
DRSG TEGADERM 4X4.75 (GAUZE/BANDAGES/DRESSINGS) ×6 IMPLANT
ELECT COATED BLADE 2.86 ST (ELECTRODE) ×2 IMPLANT
ELECTRODE EMG 18 NIMS (NEUROSURGERY SUPPLIES) ×2 IMPLANT
ELECTRODE REM PT RTRN 9FT ADLT (ELECTROSURGICAL) ×2 IMPLANT
FORCEPS BIPOLAR SPETZLER 8 1.0 (NEUROSURGERY SUPPLIES) ×2 IMPLANT
GAUZE 4X4 16PLY ~~LOC~~+RFID DBL (SPONGE) ×2 IMPLANT
GAUZE SPONGE 4X4 12PLY STRL (GAUZE/BANDAGES/DRESSINGS) ×2 IMPLANT
GENERATOR PULSE INSPIRE V SENS (Generator) ×2 IMPLANT
GLOVE BIO SURGEON STRL SZ8 (GLOVE) ×2 IMPLANT
GLOVE BIOGEL PI IND STRL 8 (GLOVE) ×2 IMPLANT
GOWN STRL REUS W/ TWL LRG LVL3 (GOWN DISPOSABLE) ×6 IMPLANT
GOWN STRL REUS W/TWL 2XL LVL3 (GOWN DISPOSABLE) ×2 IMPLANT
KIT BASIN OR (CUSTOM PROCEDURE TRAY) ×2 IMPLANT
KIT NEURO ACCESSORY W/WRENCH (MISCELLANEOUS) IMPLANT
KIT TURNOVER KIT B (KITS) ×2 IMPLANT
LEAD SLEEP STIM INSPIRE IV/V (Lead) ×2 IMPLANT
LOOP VASCLR MAXI BLUE 18IN ST (MISCELLANEOUS) ×2 IMPLANT
MARKER SKIN DUAL TIP RULER LAB (MISCELLANEOUS) ×4 IMPLANT
NDL HYPO 25GX1X1/2 BEV (NEEDLE) ×2 IMPLANT
NEEDLE HYPO 25GX1X1/2 BEV (NEEDLE) ×1 IMPLANT
PAD ARMBOARD POSITIONER FOAM (MISCELLANEOUS) ×2 IMPLANT
PASSER CATH 38CM DISP (INSTRUMENTS) ×2 IMPLANT
PENCIL SMOKE EVACUATOR (MISCELLANEOUS) ×2 IMPLANT
POSITIONER HEAD DONUT 9IN (MISCELLANEOUS) ×2 IMPLANT
PROBE NERVE STIMULATOR (NEUROSURGERY SUPPLIES) ×2 IMPLANT
REMOTE CONTROL SLEEP INSPIRE (MISCELLANEOUS) ×2 IMPLANT
SET WALTER ACTIVATION W/DRAPE (SET/KITS/TRAYS/PACK) ×2 IMPLANT
SHEARS HARMONIC 9CM CVD (BLADE) IMPLANT
SOLN 0.9% NACL POUR BTL 1000ML (IV SOLUTION) ×2 IMPLANT
SPONGE INTESTINAL PEANUT (DISPOSABLE) ×2 IMPLANT
SUT MNCRL AB 4-0 PS2 18 (SUTURE) IMPLANT
SUT MON AB 5-0 PS2 18 (SUTURE) ×2 IMPLANT
SUT SILK 2 0 SH (SUTURE) IMPLANT
SUT SILK 2 0 TIES 10X30 (SUTURE) ×2 IMPLANT
SUT SILK 3 0 SH 30 (SUTURE) IMPLANT
SUT SILK 3 0 SH CR/8 (SUTURE) IMPLANT
SUT SILK 3 0 TIES 10X30 (SUTURE) ×2 IMPLANT
SUT VIC AB 3-0 SH 27X BRD (SUTURE) ×2 IMPLANT
SUT VIC AB 4-0 PS2 27 (SUTURE) ×2 IMPLANT
SUTURE SILK 3-0 RB1 30XBRD (SUTURE) IMPLANT
SYR 10ML LL (SYRINGE) ×2 IMPLANT
TAPE CLOTH SURG 4X10 WHT LF (GAUZE/BANDAGES/DRESSINGS) ×2 IMPLANT
TOWEL GREEN STERILE (TOWEL DISPOSABLE) ×2 IMPLANT
TRAY ENT MC OR (CUSTOM PROCEDURE TRAY) ×2 IMPLANT
VASCULAR TIE MINI RED 18IN STL (MISCELLANEOUS) ×2 IMPLANT

## 2023-12-06 NOTE — Transfer of Care (Signed)
 Immediate Anesthesia Transfer of Care Note  Patient: John Salazar  Procedure(s) Performed: INSERTION, HYPOGLOSSAL NERVE STIMULATOR  Patient Location: PACU  Anesthesia Type:General  Level of Consciousness: drowsy and patient cooperative  Airway & Oxygen Therapy: Patient Spontanous Breathing and Patient connected to face mask oxygen  Post-op Assessment: Report given to RN, Post -op Vital signs reviewed and stable, Patient moving all extremities, and Patient moving all extremities X 4  Post vital signs: Reviewed and stable  Last Vitals:  Vitals Value Taken Time  BP 119/81 12/06/23 17:45  Temp 36.2 C 12/06/23 17:44  Pulse 100 12/06/23 17:46  Resp 14 12/06/23 17:46  SpO2 98 % 12/06/23 17:46  Vitals shown include unfiled device data.  Last Pain:  Vitals:   12/06/23 1744  TempSrc:   PainSc: Asleep         Complications: No notable events documented.

## 2023-12-06 NOTE — Op Note (Addendum)
 ADDENDUM: Updated CPT code and procedure to correct implant    SURGEON:  Penne Croak, DO   ASSISTANT:  Payton, who was necessary for assistance with retraction and closure.   ANESTHESIA:  General endotracheal anesthesia. 6cc 1% lidocaine  with epinephrine    COMPLICATIONS:  None.   INDICATIONS:  The patient is a 78 year old male with a history of obstructive sleep apnea and BMI 25.1 who has not been able to tolerate CPAP.  He presents to the operating room for placement of hypoglossal nerve stimulator.   FINDINGS:  Surgical anatomy was unremarkable.  The device was tested intraoperatively and demonstrated excellent stimulation and sensor lead function. ____________________________________________________________________________________________   Procedure:  12th cranial nerve (hypoglossal) stimulation implant including placement of neurostimulator with integrated respiratory sensor, and stimulation lead(CPT 8590307615).    ____________________________________________________________________________________________   CPT Codes: 35431 Incision for implantation of cranial nerve (hypoglossal) neurostimulator electrode array and pulse generator with integrated respiratory sensor   Pre-Op Diagnose:  Moderate /Severe obstructive sleep apnea with positive airway pressure intolerance (ICD-10 G47.33).   Z68.25 overweight  Post-Op Diagnosis:  Moderate /Severe obstructive sleep apnea with positive pressure airway intolerance (ICD-10 G47.33).   Z68.25 overweight ____________________________________________________________________________________________   Anesthesia:   General endotracheal    Estimated Blood Loss: 15 mL    Complications:  None    Procedure Description: This patient's obstructive sleep apnea diagnosis (G47.33) is associated with a BMI 25.1-diagnosis of (Z68.25). The patient was brought to the Operating Room and was anesthetized via general endotracheal anesthesia without  complication. The patient was prepped and draped in usual sterile fashion with the head turned to the left. Prior to prepping and draping, electrodes were placed in the genioglossus and hyoglossus muscle and connected to the NIM box for intraoperative nerve monitoring.    A modified sub-mandibular incision was made in the right upper neck approximately 2 cm below the mandible. Dissection was carried down through the subcutaneous tissue and platysma. The anterior/inferior border of the submandibular gland was identified as well as the digastric tendon. The submandibular gland was retracted posteriorly. The digastric tendon was retracted inferiorly. Dissection continued down into the digastric triangle and the posterior border of the mylohyoid muscle was freed up and retracted anteriorly. With balanced retraction, the hypoglossal nerve was identified in its usual fashion. The superior/posterior branches innervating the hyoglossus muscle were identified using NIM and anatomical cues. The cuff electrode for the hypoglossal nerve stimulator [phone]; [id number]) was placed distally to these branches innervating genioglossus, transverse, and vertical muscles. The stimulation lead was anchored to the digastric tendon using two sutures.    A second 5 cm incision was made in the right upper chest over the second intercostal space, approximately 3cm lateral to the sternal margin. Dissection was carried down through the skin and subcutaneous tissue to the fascia of the pectoralis muscle. An inferior pocket for the generator was created deep to the subcutaneous layer and superficial to the fascia of the pectoralis muscle.    The stimulation lead was then tunneled in a subplatysmal plane with blunt dissection and brought out into the sub-clavicular pocket where the stimulation lead was connected to the implantable pulse generator.    The implantable pulse generator [phone]; [id number]) was placed in the subclavicular pocket ensuring  the lead body was deep to the generator and secured with use of air knots to the pectoralis fascia using sutures. Diagnostic evaluation confirmed good placement of the stimulation cuff as demonstrated by activation of the genioglossus and transverse and vertical muscles, resulting  in unhindered, stiffened tongue protrusion, confirmed visually. Diagnostic evaluation also confirmed functionality of respiratory sensing with good rise and fall associated with patient respirations.    After testing demonstrated good stimulation and good sensor lead function, each wound was copiously irrigated with saline.  The chest wound was closed in the deep tissues with 3-0 Vicryl suture in simple interrupted fashion and in the subcutaneous layer using 4-0 Vicryl suture in a simple interrupted fashion.  The neck incision was closed in the platysma layer with 3-0 Vicryl suture in a simple interrupted fashion and then in the subcutaneous layer using 4-0 Vicryl suture in a simple interrupted fashion.  Both incisions were covered with Dermabond.  The patient was then cleaned off and drapes were removed.  The nerve integrity monitor was removed.  Each incision was then covered with gauze pressure dressing.  The patient was then returned to anesthesia for wakeup and was extubated and moved to the recovery room in stable condition.  The patient was then awakened, extubated, and transferred to Recovery Room in stable condition. I was present for and performed the entire procedure.    Post-Op Plan:    Diagnostic Codes:  G47.33 Obstructive sleep apnea    HCPCS II Device Codes: C1787/no applicable L code (qty 1) - Neurostimulator Patient Programmer    [id number] (qty 1) - Implantable Neurostimulator Generator with integrated respiratory sensor   [phone] (qty 1) - Implantable Neurostimulator Lead (1. Stimulation)

## 2023-12-06 NOTE — H&P (Signed)
 Dear Dr. Rexford ref. provider found, Here is my assessment for our mutual patient, John Salazar. Thank you for allowing me the opportunity to care for your patient. Please do not hesitate to contact me should you have any other questions. Sincerely, Dr. Penne Croak  Otolaryngology Clinic Note Referring provider: Dr. Rexford ref. provider found HPI:  Discussed the use of AI scribe software for clinical note transcription with the patient, who gave verbal consent to proceed.  History of Present Illness John Salazar is a 78 year old male with severe sleep apnea who presents for pre-surgical consultation. CPAP intolerant   No recent changes in health. 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   Echocardiogram 04/2021: 40 mm   Barrett esophagus    BPH (benign prostatic hyperplasia)    Cerebral aneurysm    Chronic kidney disease    stage 2   Colon polyps    Coronary artery disease    mild-mod non-obstructive CAD   Depression    Erectile dysfunction    GERD (gastroesophageal reflux disease)    History of kidney stones    History of stomach ulcers    Hypercholesteremia    Hypertension    Internal carotid artery stenosis, right 06/29/2020   Positive TB test    in the past   Sleep apnea    does not use CPAP     Past Surgical History:  Procedure Laterality Date   APPENDECTOMY  1970   CATARACT EXTRACTION W/ INTRAOCULAR LENS  IMPLANT, BILATERAL     COLONOSCOPY  10/2012   DRUG INDUCED ENDOSCOPY N/A 10/30/2023   Procedure: DRUG INDUCED SLEEP ENDOSCOPY;  Surgeon: Okey Burns, MD;  Location: [facility];  Service: ENT;  Laterality: N/A;   ESOPHAGOGASTRODUODENOSCOPY ENDOSCOPY  10/2012   HERNIA REPAIR     IR 3D INDEPENDENT WKST  04/14/2020   IR ANGIO INTRA EXTRACRAN SEL COM CAROTID INNOMINATE BILAT MOD SED  08/09/2017   IR ANGIO INTRA EXTRACRAN SEL COM CAROTID  INNOMINATE BILAT MOD SED  11/08/2018   IR ANGIO INTRA EXTRACRAN SEL COM CAROTID INNOMINATE BILAT MOD SED  04/14/2020   IR ANGIO INTRA EXTRACRAN SEL COM CAROTID INNOMINATE UNI L MOD SED  06/29/2020   IR ANGIO INTRA EXTRACRAN SEL INTERNAL CAROTID UNI L MOD SED  05/20/2020   IR ANGIO VERTEBRAL SEL VERTEBRAL BILAT MOD SED  08/09/2017   IR ANGIO VERTEBRAL SEL VERTEBRAL BILAT MOD SED  11/08/2018   IR ANGIO VERTEBRAL SEL VERTEBRAL BILAT MOD SED  04/14/2020   IR ANGIOGRAM FOLLOW UP STUDY  05/20/2020   IR INTRAVSC STENT CERV CAROTID W/EMB-PROT MOD SED INCL ANGIO  08/23/2017   IR INTRAVSC STENT CERV CAROTID W/EMB-PROT MOD SED INCL ANGIO  06/29/2020   IR RADIOLOGIST EVAL & MGMT  05/05/2020   IR RADIOLOGIST EVAL & MGMT  06/03/2020   IR RADIOLOGIST EVAL & MGMT  07/15/2020   IR TRANSCATH/EMBOLIZ  05/18/2020   IR US  GUIDE VASC ACCESS RIGHT  04/14/2020   IR US  GUIDE VASC ACCESS RIGHT  05/20/2020   IR US  GUIDE VASC ACCESS RIGHT  06/29/2020   LEFT HEART CATH AND CORONARY ANGIOGRAPHY N/A 11/15/2018   Procedure: LEFT HEART CATH AND CORONARY ANGIOGRAPHY;  Surgeon: Mady Bruckner, MD;  Location: [facility];  Service: Cardiovascular;  Laterality: N/A;   PROSTATE SURGERY  1999   20 years ago, enlarged prostate  RADIOLOGY WITH ANESTHESIA N/A 08/23/2017   Procedure: IR WITH ANESTHESIA STENT PLACEMENT;  Surgeon: Dolphus Carrion, MD;  Location: [facility] OR;  Service: Radiology;  Laterality: N/A;   RADIOLOGY WITH ANESTHESIA N/A 05/18/2020   Procedure: IR WITH ANESTHESIA EMBOLIZATION;  Surgeon: Dolphus Carrion, MD;  Location: [facility] OR;  Service: Radiology;  Laterality: N/A;   RADIOLOGY WITH ANESTHESIA Left 06/29/2020   Procedure: RADIOLOGY WITH ANESTHESIA  LEFT CAROTID STENT PLACEMENT;  Surgeon: Dolphus Carrion, MD;  Location: [facility] OR;  Service: Radiology;  Laterality: Left;    Family History  Problem Relation Age of Onset   Stroke Mother 93   Aneurysm Father 38     Social Connections: Socially Integrated (08/22/2023)   Social  Connection and Isolation Panel    Frequency of Communication with Friends and Family: More than three times a week    Frequency of Social Gatherings with Friends and Family: More than three times a week    Attends Religious Services: More than 4 times per year    Active Member of [employer] or Organizations: Yes    Attends Engineer, Structural: More than 4 times per year    Marital Status: Married      Current Facility-Administered Medications:    clindamycin (CLEOCIN) IVPB 900 mg, 900 mg, Intravenous, On Call to OR, Anice Riis, DO   lactated ringers  infusion, , Intravenous, Continuous, Lucious Debby BRAVO, MD, Last Rate: 10 mL/hr at 12/06/23 1250, New Bag at 12/06/23 1250   remifentanil (ULTIVA) 2 mg in 100 mL normal saline (20 mcg/mL) Optime, 0.15 mcg/kg/min, Intravenous, To OR, Lawal, Oluwaseyi, CRNA   Physical Exam:   BP 135/82   Pulse 75   Temp 97.9 F (36.6 C) (Oral)   Resp 20   Ht 5' 9 (1.753 m)   Wt 77.1 kg   SpO2 97%   BMI 25.10 kg/m   The patient was awake, alert, and appropriate. The external ears were inspected, and otoscopy was performed to evaluate the external auditory canals and tympanic membranes. The nasal cavity and septum were examined for mucosal changes, obstruction, or discharge. The oral cavity and oropharynx were inspected for mucosal lesions, infection, or tonsillar hypertrophy. The neck was palpated for lymphadenopathy, thyroid  abnormalities, or other masses. Cranial nerve function was grossly intact.  Pertinent Findings: Physical Exam HEENT: Ears normal, nose normal, oral cavity normal. Mallampati III. Small tonsils.Neck with multiple rhytids.   Impression & Plans:  John Salazar is a 78 y.o. male    - Findings and diagnoses discussed in detail with the patient. - Risks, benefits, and alternatives were reviewed. Through shared decision making, the patient elects to proceed with below. Assessment & Plan Severe obstructive sleep apnea requiring  hypoglossal nerve stimulator implantation Severe obstructive sleep apnea exacerbated by anatomical factors. CPAP therapy failed. Hypoglossal nerve stimulator implantation indicated. Explained procedure, risks, and benefits. Patient accepted. -  Proceed with elective right sided hypoglossal nerve stimulator implant   - -  Sincerely, Riis Anice, DO Otolaryngologist (ENT) [facility] Phone: [phone] Fax: [phone]  12/06/2023, 2:16 PM

## 2023-12-06 NOTE — Anesthesia Procedure Notes (Signed)
 Procedure Name: Intubation Date/Time: 12/06/2023 2:45 PM  Performed by: Hedy Jarred, CRNAPre-anesthesia Checklist: Patient identified, Emergency Drugs available, Suction available and Patient being monitored Patient Re-evaluated:Patient Re-evaluated prior to induction Oxygen Delivery Method: Circle System Utilized Preoxygenation: Pre-oxygenation with 100% oxygen Induction Type: IV induction Ventilation: Mask ventilation without difficulty Laryngoscope Size: Mac and 4 Grade View: Grade I Tube type: Oral Tube size: 7.5 mm Number of attempts: 1 Airway Equipment and Method: Stylet and Oral airway Placement Confirmation: ETT inserted through vocal cords under direct vision, positive ETCO2 and breath sounds checked- equal and bilateral Secured at: 23 cm Tube secured with: Tape Dental Injury: Teeth and Oropharynx as per pre-operative assessment

## 2023-12-06 NOTE — Discharge Instructions (Signed)
 Post Anesthesia Guidelines  During recovery from anesthesia  You may feel drowsy and reflexes may be slowed for 24 hours:  -Do not drive, use machinery, appliances, ride bicycles or scooters  -Do not consume alcohol  -Do not make important decisions   Eating and drinking:  -Drink plenty of liquids today  -Avoid fried or spicy foods today  -Return to your regular diet slowly over the next 24 hours  -Please call if unable to keep fluids down  If your throat is sore: -A breathing tube may have been used during your surgery -Drink cool liquids or gargle with warm salt water -If soreness lasts more than a few days, please call us   Surgery Discharge Instructions:  Call clinic or return to ED if you: - develop a fever greater than 101.4 - have shaking chills or are feeling ill - become short of breath - have uncontrollable nausea or vomiting - can't hold down food or liquids or feel as though you are getting dehydrated - have significant leakage or drainage from wound - urine output of less than 30cc/hr for 12 hours - develop significant redness, pain at incision(s) or wound opens up/separates - any other acute events, problems, or concerns  Wound Care/Dressings/Drain Instructions:  - To take care of your incision/cuts on neck and chest. - It is ok to shower in 48 hours, but avoid rubbing the incision or submerging under water like in a bath or swimming pool - Your stitches are absorbable and will dissolve on their own  - In about a week, do gentle neck rolls (5 rolls twice per day) to prevent any tethering of the neck  Medications: - Resume your regular home medications except as detailed in the medication reconciliation.  - For pain, take tylenol  1000mg  every 6 hours. Avoid NSAIDs like ibuprofen or aleeve or motrin. If that is not sufficient, a stronger pain medication has been prescribed to you (oxycodone 5mg  tablet every 4-6 hours). Do not mix with any other narcotic  medication.  Follow Up:  - A follow up appointment should be scheduled for you after discharge. However, If you do not hear about your appoinment in 3 business days, please call the provided surgery clinic number and confirm/schedule your appointment.    Activity/Restrictions:  - Resume your regular activities, as tolerated. Avoid heavy lifting or straining (more than 5 lbs) for 10 days.  Diet: - Resume your regular diet, as tolerated  Additional Instructions: - Please take an over the counter stool softener while taking narcotic pain medication - DO NOT MIX NARCOTIC PAIN MEDICATIONS OR TAKE NARCOTIC PRESCRIPTIONS AT THE SAME TIME - DO NOT DRIVE OR OPERATE HEAVY MACHINERY WHILE ON NARCOTICS  - DO NOT TAKE MORE THAN 4 GRAMS (4000mg ) OF TYLENOL  (ACETAMINOPHEN ) IN 24 HOURS  Department of Otolaryngology Contact Info: Otolaryngology Front Desk Phone including questions about appointments or questions: [phone] - If after normal business hours (Monday-Friday after 5PM or Weekends/Holidays), please call same number and follow prompts for Patient Access Line. There is a physician on call for urgent matters. For life threatening emergencies, please call 911

## 2023-12-06 NOTE — Brief Op Note (Signed)
 12/06/2023  5:34 PM  PATIENT:  John Salazar  78 y.o. male  PRE-OPERATIVE DIAGNOSIS:  Obstructive sleep apnea  POST-OPERATIVE DIAGNOSIS:  Obstructive sleep apnea  PROCEDURE:  Procedure(s) with comments: INSERTION, HYPOGLOSSAL NERVE STIMULATOR (N/A) - [facility] V  SURGEON:  Surgeons and Role:    * Anice Riis, DO - Primary  PHYSICIAN ASSISTANT:   ASSISTANTS: FA Payton   ANESTHESIA:   general  EBL:  10cc   BLOOD ADMINISTERED:10cc  DRAINS: none   LOCAL MEDICATIONS USED:  LIDOCAINE    SPECIMEN:  No Specimen  DISPOSITION OF SPECIMEN:  N/A  COUNTS:  YES  TOURNIQUET:  * No tourniquets in log *  DICTATION: .Note written in EPIC  PLAN OF CARE: Discharge to home after PACU  PATIENT DISPOSITION:  PACU - hemodynamically stable.   Delay start of Pharmacological VTE agent (>24hrs) due to surgical blood loss or risk of bleeding: no

## 2023-12-07 ENCOUNTER — Encounter (HOSPITAL_COMMUNITY): Payer: Self-pay

## 2023-12-08 NOTE — Telephone Encounter (Signed)
 Dr. Okey,  Please see the patients question about when to start clopidogrel  back

## 2023-12-08 NOTE — Telephone Encounter (Signed)
 I would resume after this weekend  That should allow ample healing time

## 2023-12-11 ENCOUNTER — Encounter: Payer: Self-pay | Admitting: Radiology

## 2023-12-12 NOTE — Progress Notes (Unsigned)
 Chief Complaint: Primary GI MD:  HPI:  *** is a  ***  who was referred to me by Wendee Lynwood HERO, NP for a complaint of *** .     Discussed the use of AI scribe software for clinical note transcription with the patient, who gave verbal consent to proceed.  History of Present Illness      PREVIOUS GI WORKUP   EGD 2016:  LA Grade A esophagitis, no evidence of Barrett's esophagus   Colonoscopy 2020:  Multiple benign rectal polyps, recommended repeat in 5 years   Past Medical History:  Diagnosis Date   Anxiety    Ascending aorta dilation 08/22/2021   Echocardiogram 04/2021: 40 mm   Barrett esophagus    BPH (benign prostatic hyperplasia)    Cerebral aneurysm    Chronic kidney disease    stage 2   Colon polyps    Coronary artery disease    mild-mod non-obstructive CAD   Depression    Erectile dysfunction    GERD (gastroesophageal reflux disease)    History of kidney stones    History of stomach ulcers    Hypercholesteremia    Hypertension    Internal carotid artery stenosis, right 06/29/2020   Positive TB test    in the past   Sleep apnea    does not use CPAP    Past Surgical History:  Procedure Laterality Date   APPENDECTOMY  1970   CATARACT EXTRACTION W/ INTRAOCULAR LENS  IMPLANT, BILATERAL     COLONOSCOPY  10/2012   DRUG INDUCED ENDOSCOPY N/A 10/30/2023   Procedure: DRUG INDUCED SLEEP ENDOSCOPY;  Surgeon: Okey Burns, MD;  Location: [facility];  Service: ENT;  Laterality: N/A;   ESOPHAGOGASTRODUODENOSCOPY ENDOSCOPY  10/2012   HERNIA REPAIR     IMPLANTATION OF HYPOGLOSSAL NERVE STIMULATOR N/A 12/06/2023   Procedure: INSERTION, HYPOGLOSSAL NERVE STIMULATOR;  Surgeon: Anice Riis, DO;  Location: [facility];  Service: ENT;  Laterality: N/A;  Inspire V   IR 3D INDEPENDENT WKST  04/14/2020   IR ANGIO INTRA EXTRACRAN SEL COM CAROTID INNOMINATE BILAT MOD SED  08/09/2017   IR ANGIO INTRA EXTRACRAN SEL COM CAROTID INNOMINATE BILAT MOD SED  11/08/2018    IR ANGIO INTRA EXTRACRAN SEL COM CAROTID INNOMINATE BILAT MOD SED  04/14/2020   IR ANGIO INTRA EXTRACRAN SEL COM CAROTID INNOMINATE UNI L MOD SED  06/29/2020   IR ANGIO INTRA EXTRACRAN SEL INTERNAL CAROTID UNI L MOD SED  05/20/2020   IR ANGIO VERTEBRAL SEL VERTEBRAL BILAT MOD SED  08/09/2017   IR ANGIO VERTEBRAL SEL VERTEBRAL BILAT MOD SED  11/08/2018   IR ANGIO VERTEBRAL SEL VERTEBRAL BILAT MOD SED  04/14/2020   IR ANGIOGRAM FOLLOW UP STUDY  05/20/2020   IR INTRAVSC STENT CERV CAROTID W/EMB-PROT MOD SED INCL ANGIO  08/23/2017   IR INTRAVSC STENT CERV CAROTID W/EMB-PROT MOD SED INCL ANGIO  06/29/2020   IR RADIOLOGIST EVAL & MGMT  05/05/2020   IR RADIOLOGIST EVAL & MGMT  06/03/2020   IR RADIOLOGIST EVAL & MGMT  07/15/2020   IR TRANSCATH/EMBOLIZ  05/18/2020   IR US  GUIDE VASC ACCESS RIGHT  04/14/2020   IR US  GUIDE VASC ACCESS RIGHT  05/20/2020   IR US  GUIDE VASC ACCESS RIGHT  06/29/2020   LEFT HEART CATH AND CORONARY ANGIOGRAPHY N/A 11/15/2018   Procedure: LEFT HEART CATH AND CORONARY ANGIOGRAPHY;  Surgeon: Mady Bruckner, MD;  Location: [facility];  Service: Cardiovascular;  Laterality: N/A;   PROSTATE  SURGERY  1999   20 years ago, enlarged prostate   RADIOLOGY WITH ANESTHESIA N/A 08/23/2017   Procedure: IR WITH ANESTHESIA STENT PLACEMENT;  Surgeon: Dolphus Carrion, MD;  Location: [facility];  Service: Radiology;  Laterality: N/A;   RADIOLOGY WITH ANESTHESIA N/A 05/18/2020   Procedure: IR WITH ANESTHESIA EMBOLIZATION;  Surgeon: Dolphus Carrion, MD;  Location: [facility];  Service: Radiology;  Laterality: N/A;   RADIOLOGY WITH ANESTHESIA Left 06/29/2020   Procedure: RADIOLOGY WITH ANESTHESIA  LEFT CAROTID STENT PLACEMENT;  Surgeon: Dolphus Carrion, MD;  Location: [facility];  Service: Radiology;  Laterality: Left;    Current Outpatient Medications  Medication Sig Dispense Refill   amLODipine  (NORVASC ) 5 MG tablet Take 1 tablet (5 mg total) by mouth 2 (two) times daily.     clindamycin (CLEOCIN) 300 MG  capsule Take 1 capsule (300 mg total) by mouth 3 (three) times daily for 7 days. 21 capsule 0   clopidogrel  (PLAVIX ) 75 MG tablet TAKE ONE TABLET BY MOUTH DAILY 90 tablet 3   escitalopram  (LEXAPRO ) 20 MG tablet Take 1 tablet (20 mg total) by mouth daily. (Patient taking differently: Take 20 mg by mouth at bedtime.) 90 tablet 3   famotidine  (PEPCID ) 20 MG tablet Take 20 mg by mouth daily as needed for heartburn or indigestion.     isosorbide  mononitrate (IMDUR ) 30 MG 24 hr tablet Take 0.5 tablets (15 mg total) by mouth daily. AS NEEDED (Patient taking differently: Take 15 mg by mouth daily as needed (for fatigue, to increase blood flow.).) 30 tablet 6   nitroGLYCERIN  (NITROSTAT ) 0.4 MG SL tablet Place 1 tablet (0.4 mg total) under the tongue every 5 (five) minutes as needed for chest pain. 25 tablet prn   pantoprazole  (PROTONIX ) 40 MG tablet Take 1 tablet (40 mg total) by mouth 2 (two) times daily. 180 tablet 3   rosuvastatin  (CRESTOR ) 40 MG tablet Take one (1) tablet by mouth (40 mg) daily. (Patient taking differently: Take 40 mg by mouth every evening.) 90 tablet 3   tamsulosin  (FLOMAX ) 0.4 MG CAPS capsule Take one capsule once daily ( thirty minutes after same meal daily) (Patient taking differently: Take one capsule once daily ( thirty minutes after same meal daily) nightly)     No current facility-administered medications for this visit.    Allergies as of 12/13/2023 - Review Complete 12/06/2023  Allergen Reaction Noted   Bee venom Anaphylaxis and Shortness Of Breath 04/18/2017   Penicillins Swelling and Rash 03/10/2014    Family History  Problem Relation Age of Onset   Stroke Mother 84   Aneurysm Father 27    Social History   Socioeconomic History   Marital status: Married    Spouse name: pamela   Number of children: 2   Years of education: 12   Highest education level: GED or equivalent  Occupational History   Occupation: comptroller.  Tobacco Use   Smoking  status: Former    Current packs/day: 0.00    Average packs/day: 2.0 packs/day for 25.0 years (50.0 ttl pk-yrs)    Types: Cigarettes    Start date: 03/11/1971    Quit date: 03/10/1996    Years since quitting: 27.7   Smokeless tobacco: Never  Vaping Use   Vaping status: Never Used  Substance and Sexual Activity   Alcohol use: No    Alcohol/week: 0.0 standard drinks of alcohol    Comment: in the past drank, quit 1993   Drug use: No   Sexual activity:  Not Currently  Other Topics Concern   Not on file  Social History Narrative   Lives with Sharlet (Wife) - together for 28 years   Children - Randine and Mliss -- one is a engineer, civil (consulting) in [employer] and other works for [employer] - 18, lives in [place]   Enjoys - playing golf, goes to [employer] and sponsors people as well, church   Support - family, good friends in [place]   Exercise - walks the dog, runs up hills   Diet - improved from before, tries to drink water - low salt or no salt      Retail buyer for [employer]    Social Drivers of Health   Financial Resource Strain: Low Risk  (08/22/2023)   Overall Financial Resource Strain (CARDIA)    Difficulty of Paying Living Expenses: Not hard at all  Food Insecurity: No Food Insecurity (08/22/2023)   Hunger Vital Sign    Worried About Running Out of Food in the Last Year: Never true    Ran Out of Food in the Last Year: Never true  Transportation Needs: No Transportation Needs (08/22/2023)   PRAPARE - Administrator, Civil Service (Medical): No    Lack of Transportation (Non-Medical): No  Physical Activity: Insufficiently Active (08/22/2023)   Exercise Vital Sign    Days of Exercise per Week: 4 days    Minutes of Exercise per Session: 30 min  Stress: No Stress Concern Present (08/22/2023)   [employer] of Occupational Health - Occupational Stress Questionnaire    Feeling of Stress: Not at all  Social Connections: Socially Integrated (08/22/2023)   Social Connection  and Isolation Panel    Frequency of Communication with Friends and Family: More than three times a week    Frequency of Social Gatherings with Friends and Family: More than three times a week    Attends Religious Services: More than 4 times per year    Active Member of [employer] or Organizations: Yes    Attends Engineer, Structural: More than 4 times per year    Marital Status: Married  Catering Manager Violence: Not At Risk (08/15/2023)   Humiliation, Afraid, Rape, and Kick questionnaire    Fear of Current or Ex-Partner: No    Emotionally Abused: No    Physically Abused: No    Sexually Abused: No    Review of Systems:    Constitutional: No weight loss, fever, chills, weakness or fatigue HEENT: Eyes: No change in vision               Ears, Nose, Throat:  No change in hearing or congestion Skin: No rash or itching Cardiovascular: No chest pain, chest pressure or palpitations   Respiratory: No SOB or cough Gastrointestinal: See HPI and otherwise negative Genitourinary: No dysuria or change in urinary frequency Neurological: No headache, dizziness or syncope Musculoskeletal: No new muscle or joint pain Hematologic: No bleeding or bruising Psychiatric: No history of depression or anxiety    Physical Exam:  Vital signs: There were no vitals taken for this visit.  Constitutional: NAD, alert and cooperative Head:  Normocephalic and atraumatic. Eyes:   PEERL, EOMI. No icterus. Conjunctiva pink. Respiratory: Respirations even and unlabored. Lungs clear to auscultation bilaterally.   No wheezes, crackles, or rhonchi.  Cardiovascular:  Regular rate and rhythm. No peripheral edema, cyanosis or pallor.  Gastrointestinal:  Soft, nondistended, nontender. No rebound or guarding. Normal bowel sounds. No appreciable masses or  hepatomegaly. Rectal:  Declines Msk:  Symmetrical without gross deformities. Without edema, no deformity or joint abnormality.  Neurologic:  Alert and  oriented x4;   grossly normal neurologically.  Skin:   Dry and intact without significant lesions or rashes. Psychiatric: Oriented to person, place and time. Demonstrates good judgement and reason without abnormal affect or behaviors.  Physical Exam    RELEVANT LABS AND IMAGING: CBC    Component Value Date/Time   WBC 5.3 11/28/2023 1000   RBC 4.60 11/28/2023 1000   HGB 13.6 11/28/2023 1000   HGB 13.7 11/28/2022 1130   HCT 42.1 11/28/2023 1000   HCT 42.8 11/28/2022 1130   PLT 177 11/28/2023 1000   PLT 178 11/28/2022 1130   MCV 91.5 11/28/2023 1000   MCV 94 11/28/2022 1130   MCH 29.6 11/28/2023 1000   MCHC 32.3 11/28/2023 1000   RDW 14.0 11/28/2023 1000   RDW 12.9 11/28/2022 1130   LYMPHSABS 1.1 08/14/2023 1346   MONOABS 0.4 08/14/2023 1346   EOSABS 0.1 08/14/2023 1346   BASOSABS 0.0 08/14/2023 1346    CMP     Component Value Date/Time   NA 138 11/28/2023 1000   NA 141 08/24/2023 1239   K 4.2 11/28/2023 1000   CL 105 11/28/2023 1000   CO2 24 11/28/2023 1000   GLUCOSE 94 11/28/2023 1000   BUN 20 11/28/2023 1000   BUN 16 08/24/2023 1239   CREATININE 1.53 (H) 11/28/2023 1000   CALCIUM  9.4 11/28/2023 1000   PROT 6.1 (L) 08/15/2023 0451   ALBUMIN 3.4 (L) 08/15/2023 0451   AST 18 08/15/2023 0451   ALT 19 08/15/2023 0451   ALKPHOS 59 08/15/2023 0451   BILITOT 0.6 08/15/2023 0451   GFRNONAA 46 (L) 11/28/2023 1000   GFRAA 52 (L) 01/13/2020 1138     Assessment/Plan:    IBS-D Chronic bloating, gas, and diarrhea. Previously given rifaximin  and low FODMAP diet  GERD On protonix  40mg  twice daily. EGD 01/2023 with LA grade A reflux esophagitis, small hiatal hernia, biopsies with mild reactive gastropathy negative for h pylori  History of colon polyps colonoscopy in 2014 at which time a hyperplastic (benign) polyp was removed.  another colonoscopy in 2020 in which several rectal polyps were removed, one of these was a tubular adenoma. no family history of colon cancer.   CAD Cath  in 2020 ECHO with EF 60-65% 08/2023  History of bilateral carotid artery stents On ASA and plavix   L ICA aneurysm s/p embolization  Javel Hersh Mollie RIGGERS [facility] 12/12/2023, 12:28 PM  Cc: Wendee Lynwood HERO, NP

## 2023-12-12 NOTE — Anesthesia Postprocedure Evaluation (Signed)
 Anesthesia Post Note  Patient: John Salazar  Procedure(s) Performed: INSERTION, HYPOGLOSSAL NERVE STIMULATOR     Patient location during evaluation: PACU Anesthesia Type: General Level of consciousness: awake and alert Pain management: pain level controlled Vital Signs Assessment: post-procedure vital signs reviewed and stable Respiratory status: spontaneous breathing, nonlabored ventilation, respiratory function stable and patient connected to nasal cannula oxygen Cardiovascular status: blood pressure returned to baseline and stable Postop Assessment: no apparent nausea or vomiting Anesthetic complications: no   No notable events documented.  Last Vitals:  Vitals:   12/06/23 1845 12/06/23 1900  BP: 124/80 118/73  Pulse: 95 91  Resp: 15 12  Temp:  36.5 C  SpO2: 95% 95%    Last Pain:  Vitals:   12/06/23 1833  TempSrc:   PainSc: 8                  Kyon Bentler P Kristen Fromm

## 2023-12-13 ENCOUNTER — Ambulatory Visit: Admitting: Gastroenterology

## 2023-12-13 ENCOUNTER — Encounter: Payer: Self-pay | Admitting: Gastroenterology

## 2023-12-13 VITALS — BP 122/68 | HR 83 | Ht 69.0 in | Wt 174.4 lb

## 2023-12-13 DIAGNOSIS — K2100 Gastro-esophageal reflux disease with esophagitis, without bleeding: Secondary | ICD-10-CM

## 2023-12-13 DIAGNOSIS — Z9889 Other specified postprocedural states: Secondary | ICD-10-CM

## 2023-12-13 DIAGNOSIS — K580 Irritable bowel syndrome with diarrhea: Secondary | ICD-10-CM

## 2023-12-13 DIAGNOSIS — I6523 Occlusion and stenosis of bilateral carotid arteries: Secondary | ICD-10-CM | POA: Diagnosis not present

## 2023-12-13 DIAGNOSIS — Z95828 Presence of other vascular implants and grafts: Secondary | ICD-10-CM

## 2023-12-13 MED ORDER — VOQUEZNA 10 MG PO TABS: 10.0000 mg | ORAL_TABLET | Freq: Every day | ORAL | 1 refills | Status: DC

## 2023-12-13 NOTE — Patient Instructions (Addendum)
 _______________________________________________________  If your blood pressure at your visit was 140/90 or greater, please contact your primary care physician to follow up on this.  _______________________________________________________  If you are age 79 or older, your body mass index should be between 23-30. Your Body mass index is 25.75 kg/m. If this is out of the aforementioned range listed, please consider follow up with your Primary Care Provider.  If you are age 94 or younger, your body mass index should be between 19-25. Your Body mass index is 25.75 kg/m. If this is out of the aformentioned range listed, please consider follow up with your Primary Care Provider.   ________________________________________________________  The [facility] providers would like to encourage you to use MYCHART to communicate with providers for non-urgent requests or questions.  Due to long hold times on the telephone, sending your provider a message by [facility] may be a faster and more efficient way to get a response.  Please allow 48 business hours for a response.  Please remember that this is for non-urgent requests.  _______________________________________________________  [facility] is using a team-based approach to care.  Your team is made up of your doctor and two to three APPS. Our APPS (Nurse Practitioners and Physician Assistants) work with your physician to ensure care continuity for you. They are fully qualified to address your health concerns and develop a treatment plan. They communicate directly with your gastroenterologist to care for you. Seeing the Advanced Practice Practitioners on your physician's team can help you by facilitating care more promptly, often allowing for earlier appointments, access to diagnostic testing, procedures, and other specialty referrals.   We have sent the following medications to your pharmacy for you to pick up at your convenience: Voquezna 10mg   daily  You have been scheduled for an appointment with Nestor Blower on 01-26-24 at 820am . Please arrive 10 minutes early for your appointment.  Please call us  with any questions or concerns.  It was a pleasure to see you today!  Thank you for trusting me with your gastrointestinal care!

## 2023-12-14 ENCOUNTER — Telehealth: Payer: Self-pay | Admitting: Internal Medicine

## 2023-12-14 ENCOUNTER — Telehealth (INDEPENDENT_AMBULATORY_CARE_PROVIDER_SITE_OTHER): Payer: Self-pay

## 2023-12-14 NOTE — Telephone Encounter (Signed)
 Patient LVM to let Dr. Anice know that he has a sore throat. Had INSPIRE surgery on 10/29. He says that Dr. Anice told him to let him know if he develops a sore throat. Please advise.

## 2023-12-14 NOTE — Telephone Encounter (Signed)
 Pt c/o medication issue:  1. Name of Medication: Vonoprazan Fumarate (VOQUEZNA) 10 MG TABS  clopidogrel  (PLAVIX ) 75 MG tablet   2. How are you currently taking this medication (dosage and times per day)? N/A  3. Are you having a reaction (difficulty breathing--STAT)? No   4. What is your medication issue? Pharmacist would like to make sure both medications are okay to take together.

## 2023-12-15 ENCOUNTER — Encounter (INDEPENDENT_AMBULATORY_CARE_PROVIDER_SITE_OTHER): Admitting: Otolaryngology

## 2023-12-15 ENCOUNTER — Telehealth (INDEPENDENT_AMBULATORY_CARE_PROVIDER_SITE_OTHER): Payer: Self-pay

## 2023-12-15 NOTE — Telephone Encounter (Signed)
 Dr. Okey Pacific will decrease efficacy of clopidogrel  when taken together.  Looks like he's working with GI to find something to help with reflux/GERD issues, and has failed bid pantoprazole .  Can we consider switching him to ticagrelor  or aspirin , neither of which will have an interaction?  Cassidee Deats

## 2023-12-15 NOTE — Telephone Encounter (Signed)
 The patient is reporting a rash around the surgical area. He is concerned he is having some kind of allergic reaction?  Please advise

## 2023-12-18 ENCOUNTER — Other Ambulatory Visit: Payer: Self-pay | Admitting: Internal Medicine

## 2023-12-19 ENCOUNTER — Telehealth (INDEPENDENT_AMBULATORY_CARE_PROVIDER_SITE_OTHER): Payer: Self-pay

## 2023-12-19 ENCOUNTER — Encounter (INDEPENDENT_AMBULATORY_CARE_PROVIDER_SITE_OTHER): Payer: Self-pay

## 2023-12-19 ENCOUNTER — Ambulatory Visit (INDEPENDENT_AMBULATORY_CARE_PROVIDER_SITE_OTHER)

## 2023-12-19 VITALS — BP 147/81 | HR 82 | Temp 98.0°F | Wt 170.0 lb

## 2023-12-19 DIAGNOSIS — L259 Unspecified contact dermatitis, unspecified cause: Secondary | ICD-10-CM

## 2023-12-19 DIAGNOSIS — Z789 Other specified health status: Secondary | ICD-10-CM | POA: Diagnosis not present

## 2023-12-19 DIAGNOSIS — R21 Rash and other nonspecific skin eruption: Secondary | ICD-10-CM

## 2023-12-19 DIAGNOSIS — G4733 Obstructive sleep apnea (adult) (pediatric): Secondary | ICD-10-CM

## 2023-12-19 MED ORDER — CETIRIZINE HCL 10 MG PO TABS: 5.0000 mg | ORAL_TABLET | Freq: Every day | ORAL | 11 refills | Status: DC

## 2023-12-19 MED ORDER — METHYLPREDNISOLONE 4 MG PO TBPK: ORAL_TABLET | ORAL | 0 refills | Status: DC

## 2023-12-19 NOTE — Telephone Encounter (Signed)
 I received message that pt called asking if Dr. Anice was sending in a Antibiotic.I informed him in a VM that Dr. Anice wants him to only take Zyrtec and he sent in prednisone , no antibiotic.Ant questions, call the office back and ask for [facility].

## 2023-12-19 NOTE — Progress Notes (Signed)
 Dear Dr. Wendee, Here is my assessment for our mutual patient, John Salazar. Thank you for allowing me the opportunity to care for your patient. Please do not hesitate to contact me should you have any other questions. Sincerely, Dr. Penne Croak  Otolaryngology Clinic Note Referring provider: Dr. Wendee HPI:  Discussed the use of AI scribe software for clinical note transcription with the patient, who gave verbal consent to proceed.  History of Present Illness John Salazar is a 78 year old male who presents with a spreading rash following recent surgery.  Cutaneous rash - Spreading rash developed after use of antibacterial soap the morning prior to recent surgery - Initial presentation as red dots, now larger and affecting backside, arms, legs, neck, and face - Rash is dry and slightly pruritic, especially where shirt contacts skin - No associated pain - Rash does not remain in one location consistently - No relief with Benadryl , taken daily and nightly - No improvement after discontinuing oral antibiotic - No change in diet or unusual food or beverage intake - Concern for worsening rash daily, with fear of ocular involvement due to history of severe allergic reactions causing significant swelling  Possible allergic triggers - Recent exposure to new PPI, hibaclens, dermabond and clindamycin - Surgical glue has been removed - History of severe allergic reactions with significant swelling - Allergic to penicillin; antibiotics generally cause gastrointestinal discomfort - No recent change in use of [place] soap  Independent Review of Additional Tests or Records:  Reviewed external note from referring PCP, Cable,describing relevant history incorporated into today's evaluation.  PMH/Meds/All/SocHx/FamHx/ROS:   Past Medical History:  Diagnosis Date   Anxiety    Ascending aorta dilation 08/22/2021   Echocardiogram 04/2021: 40 mm   Barrett esophagus    BPH (benign prostatic  hyperplasia)    Cerebral aneurysm    Chronic kidney disease    stage 2   Colon polyps    Coronary artery disease    mild-mod non-obstructive CAD   Depression    Erectile dysfunction    GERD (gastroesophageal reflux disease)    History of kidney stones    History of stomach ulcers    Hypercholesteremia    Hypertension    Internal carotid artery stenosis, right 06/29/2020   Positive TB test    in the past   Sleep apnea    does not use CPAP     Past Surgical History:  Procedure Laterality Date   APPENDECTOMY  1970   CATARACT EXTRACTION W/ INTRAOCULAR LENS  IMPLANT, BILATERAL     COLONOSCOPY  10/2012   DRUG INDUCED ENDOSCOPY N/A 10/30/2023   Procedure: DRUG INDUCED SLEEP ENDOSCOPY;  Surgeon: Okey Burns, MD;  Location: [facility];  Service: ENT;  Laterality: N/A;   ESOPHAGOGASTRODUODENOSCOPY ENDOSCOPY  10/2012   HERNIA REPAIR     IMPLANTATION OF HYPOGLOSSAL NERVE STIMULATOR N/A 12/06/2023   Procedure: INSERTION, HYPOGLOSSAL NERVE STIMULATOR;  Surgeon: Croak Penne, DO;  Location: [facility];  Service: ENT;  Laterality: N/A;  [employer] V   IR 3D INDEPENDENT WKST  04/14/2020   IR ANGIO INTRA EXTRACRAN SEL COM CAROTID INNOMINATE BILAT MOD SED  08/09/2017   IR ANGIO INTRA EXTRACRAN SEL COM CAROTID INNOMINATE BILAT MOD SED  11/08/2018   IR ANGIO INTRA EXTRACRAN SEL COM CAROTID INNOMINATE BILAT MOD SED  04/14/2020   IR ANGIO INTRA EXTRACRAN SEL COM CAROTID INNOMINATE UNI L MOD SED  06/29/2020   IR ANGIO INTRA EXTRACRAN SEL INTERNAL CAROTID UNI L  MOD SED  05/20/2020   IR ANGIO VERTEBRAL SEL VERTEBRAL BILAT MOD SED  08/09/2017   IR ANGIO VERTEBRAL SEL VERTEBRAL BILAT MOD SED  11/08/2018   IR ANGIO VERTEBRAL SEL VERTEBRAL BILAT MOD SED  04/14/2020   IR ANGIOGRAM FOLLOW UP STUDY  05/20/2020   IR INTRAVSC STENT CERV CAROTID W/EMB-PROT MOD SED INCL ANGIO  08/23/2017   IR INTRAVSC STENT CERV CAROTID W/EMB-PROT MOD SED INCL ANGIO  06/29/2020   IR RADIOLOGIST EVAL & MGMT  05/05/2020   IR  RADIOLOGIST EVAL & MGMT  06/03/2020   IR RADIOLOGIST EVAL & MGMT  07/15/2020   IR TRANSCATH/EMBOLIZ  05/18/2020   IR US  GUIDE VASC ACCESS RIGHT  04/14/2020   IR US  GUIDE VASC ACCESS RIGHT  05/20/2020   IR US  GUIDE VASC ACCESS RIGHT  06/29/2020   LEFT HEART CATH AND CORONARY ANGIOGRAPHY N/A 11/15/2018   Procedure: LEFT HEART CATH AND CORONARY ANGIOGRAPHY;  Surgeon: Mady Bruckner, MD;  Location: [facility];  Service: Cardiovascular;  Laterality: N/A;   PROSTATE SURGERY  1999   20 years ago, enlarged prostate   RADIOLOGY WITH ANESTHESIA N/A 08/23/2017   Procedure: IR WITH ANESTHESIA STENT PLACEMENT;  Surgeon: Dolphus Carrion, MD;  Location: [facility];  Service: Radiology;  Laterality: N/A;   RADIOLOGY WITH ANESTHESIA N/A 05/18/2020   Procedure: IR WITH ANESTHESIA EMBOLIZATION;  Surgeon: Dolphus Carrion, MD;  Location: [facility];  Service: Radiology;  Laterality: N/A;   RADIOLOGY WITH ANESTHESIA Left 06/29/2020   Procedure: RADIOLOGY WITH ANESTHESIA  LEFT CAROTID STENT PLACEMENT;  Surgeon: Dolphus Carrion, MD;  Location: [facility];  Service: Radiology;  Laterality: Left;    Family History  Problem Relation Age of Onset   Stroke Mother 63   Aneurysm Father 51   Colon cancer Neg Hx    Esophageal cancer Neg Hx      Social Connections: Socially Integrated (08/22/2023)   Social Connection and Isolation Panel    Frequency of Communication with Friends and Family: More than three times a week    Frequency of Social Gatherings with Friends and Family: More than three times a week    Attends Religious Services: More than 4 times per year    Active Member of [employer] or Organizations: Yes    Attends Engineer, Structural: More than 4 times per year    Marital Status: Married      Current Outpatient Medications:    amLODipine  (NORVASC ) 5 MG tablet, Take 1 tablet (5 mg total) by mouth 2 (two) times daily., Disp: , Rfl:    cetirizine (ZYRTEC) 10 MG tablet, Take 0.5 tablets (5 mg total) by mouth  at bedtime., Disp: 15 tablet, Rfl: 11   clopidogrel  (PLAVIX ) 75 MG tablet, TAKE ONE TABLET BY MOUTH DAILY, Disp: 90 tablet, Rfl: 3   escitalopram  (LEXAPRO ) 20 MG tablet, Take 1 tablet (20 mg total) by mouth daily. (Patient taking differently: Take 20 mg by mouth at bedtime.), Disp: 90 tablet, Rfl: 3   famotidine  (PEPCID ) 20 MG tablet, Take 20 mg by mouth daily as needed for heartburn or indigestion., Disp: , Rfl:    isosorbide  mononitrate (IMDUR ) 30 MG 24 hr tablet, Take 0.5 tablets (15 mg total) by mouth daily. AS NEEDED (Patient taking differently: Take 15 mg by mouth daily as needed (for fatigue, to increase blood flow.).), Disp: 30 tablet, Rfl: 6   methylPREDNISolone  (MEDROL  DOSEPAK) 4 MG TBPK tablet, Take as directed, Disp: 1 each, Rfl: 0   nitroGLYCERIN  (NITROSTAT ) 0.4  MG SL tablet, Place 1 tablet (0.4 mg total) under the tongue every 5 (five) minutes as needed for chest pain., Disp: 25 tablet, Rfl: prn   rosuvastatin  (CRESTOR ) 40 MG tablet, Take one (1) tablet by mouth (40 mg) daily. (Patient taking differently: Take 40 mg by mouth every evening.), Disp: 90 tablet, Rfl: 3   tamsulosin  (FLOMAX ) 0.4 MG CAPS capsule, Take one capsule once daily ( thirty minutes after same meal daily) (Patient taking differently: Take one capsule once daily ( thirty minutes after same meal daily) nightly), Disp: , Rfl:    Vonoprazan Fumarate (VOQUEZNA) 10 MG TABS, Take 10 mg by mouth daily., Disp: 90 tablet, Rfl: 1   Physical Exam:   BP (!) 147/81 (BP Location: Left Arm, Patient Position: Sitting, Cuff Size: Normal) Comment: pt having allergic reaction  Pulse 82   Temp 98 F (36.7 C)   Wt 170 lb (77.1 kg)   SpO2 98%   BMI 25.10 kg/m   The patient was awake, alert, and appropriate. The external ears were inspected, and otoscopy was performed to evaluate the external auditory canals and tympanic membranes. The nasal cavity and septum were examined for mucosal changes, obstruction, or discharge. The oral cavity  and oropharynx were inspected for mucosal lesions, infection, or tonsillar hypertrophy. The neck was palpated for lymphadenopathy, thyroid  abnormalities, or other masses. Cranial nerve function was grossly intact.  Pertinent Findings: Physical Exam HEENT: Normal oropharynx, tongue movement normal. SKIN: Rash present, incision site appears healthy at neck and chest- no skin glue present. Normal epitheliaztion, no tenderness, no drainage. Mild firmness deep aspect right neck.  Similar rash on back.    Impression & Plans:  John Salazar is a 78 y.o. male  1. OSA (obstructive sleep apnea)   2. Intolerance of continuous positive airway pressure (CPAP) ventilation   3. Rash due to allergy   4. Contact dermatitis, unspecified contact dermatitis type, unspecified trigger     - Findings and diagnoses discussed in detail with the patient. - Risks, benefits, and alternatives were reviewed. Through shared decision making, the patient elects to proceed with below. Assessment & Plan Widespread allergic contact dermatitis Worsening rash with itching and dryness, likely delayed reaction to rubber seal or other substances. Differential includes reaction to glue, antibiotic, or acid reflux medication. No infection or confirmed implant reaction. - Prescribed loratadine or cetirizine daily for one week. - Prescribed a steroid taper to be taken in the morning with food. - Advised Benadryl  at night if needed for sleep. - Scheduled follow-up appointment for Friday. - Instructed to avoid applying substances to skin and use Iv[place] soap for bathing. - Discussed with [employer] rep  - Orders placed: No orders of the defined types were placed in this encounter.  - Medications prescribed/continued/adjusted:  Meds ordered this encounter  Medications   methylPREDNISolone  (MEDROL  DOSEPAK) 4 MG TBPK tablet    Sig: Take as directed    Dispense:  1 each    Refill:  0   cetirizine (ZYRTEC) 10 MG tablet    Sig: Take 0.5  tablets (5 mg total) by mouth at bedtime.    Dispense:  15 tablet    Refill:  11   - Education materials provided to the patient. - Follow up: Friday. Patient instructed to return sooner or go to the ED if new/worsening symptoms develop.   Thank you for allowing me the opportunity to care for your patient. Please do not hesitate to contact me should you have any other questions.  Sincerely, Penne Croak, DO Otolaryngologist (ENT) [facility] Phone: [phone] Fax: [phone]  12/19/2023, 3:06 PM

## 2023-12-19 NOTE — Telephone Encounter (Signed)
 Patient called regarding medications that were prescribed at his visit today 11/11. Patient picked up the steroid but said there was no antibiotic sent in. I reviewed your notes and there was no mention of an antibiotic. Is this correct Dr. Anice?

## 2023-12-20 ENCOUNTER — Encounter: Payer: Self-pay | Admitting: Internal Medicine

## 2023-12-20 ENCOUNTER — Encounter (INDEPENDENT_AMBULATORY_CARE_PROVIDER_SITE_OTHER)

## 2023-12-20 NOTE — Addendum Note (Signed)
 Addended by: MEMORY DELON POUR on: 12/20/2023 08:02 AM   Modules accepted: Orders

## 2023-12-20 NOTE — Telephone Encounter (Signed)
 That is correct. No need for antibiotic.

## 2023-12-21 MED ORDER — ASPIRIN 81 MG PO TBEC: 81.0000 mg | DELAYED_RELEASE_TABLET | Freq: Every day | ORAL | Status: AC

## 2023-12-21 NOTE — Telephone Encounter (Signed)
 Spoke with pharmacist at [facility], [facility], sharing recommendation from Dr. Okey and pharmacist (see below).  Patient has also been notified via reply to MyChart message he sent yesterday.  No further needs at this time. Closing encounter.

## 2023-12-21 NOTE — Telephone Encounter (Signed)
 Spoke with patient regarding the need for an antibiotic. Per Dr. Anice patient did not need an antibiotic. I explained this to the patient. Patient understood.

## 2023-12-22 ENCOUNTER — Encounter (INDEPENDENT_AMBULATORY_CARE_PROVIDER_SITE_OTHER)

## 2023-12-22 ENCOUNTER — Ambulatory Visit (INDEPENDENT_AMBULATORY_CARE_PROVIDER_SITE_OTHER)

## 2023-12-22 VITALS — BP 128/70 | HR 84 | Ht 69.0 in | Wt 170.0 lb

## 2023-12-22 DIAGNOSIS — R21 Rash and other nonspecific skin eruption: Secondary | ICD-10-CM

## 2023-12-22 DIAGNOSIS — L259 Unspecified contact dermatitis, unspecified cause: Secondary | ICD-10-CM | POA: Diagnosis not present

## 2023-12-23 NOTE — Progress Notes (Signed)
 Dear Dr. Wendee, Here is my assessment for our mutual patient, Marquez Ceesay. Thank you for allowing me the opportunity to care for your patient. Please do not hesitate to contact me should you have any other questions. Sincerely, Dr. Penne Croak  Otolaryngology Clinic Note Referring provider: Dr. Wendee HPI:  Discussed the use of AI scribe software for clinical note transcription with the patient, who gave verbal consent to proceed.  History of Present Illness John Salazar is a 78 year old male who presents with a spreading rash following recent surgery.  Cutaneous rash -  no pain.  - taking zyrtec and almost completed medrol  dose pack - rash is improving.  - he is interested in restarting the new PPI.   PMH/Meds/All/SocHx/FamHx/ROS:   Past Medical History:  Diagnosis Date   Anxiety    Ascending aorta dilation 08/22/2021   Echocardiogram 04/2021: 40 mm   Barrett esophagus    BPH (benign prostatic hyperplasia)    Cerebral aneurysm    Chronic kidney disease    stage 2   Colon polyps    Coronary artery disease    mild-mod non-obstructive CAD   Depression    Erectile dysfunction    GERD (gastroesophageal reflux disease)    History of kidney stones    History of stomach ulcers    Hypercholesteremia    Hypertension    Internal carotid artery stenosis, right 06/29/2020   Positive TB test    in the past   Sleep apnea    does not use CPAP     Past Surgical History:  Procedure Laterality Date   APPENDECTOMY  1970   CATARACT EXTRACTION W/ INTRAOCULAR LENS  IMPLANT, BILATERAL     COLONOSCOPY  10/2012   DRUG INDUCED ENDOSCOPY N/A 10/30/2023   Procedure: DRUG INDUCED SLEEP ENDOSCOPY;  Surgeon: Okey Burns, MD;  Location: [facility];  Service: ENT;  Laterality: N/A;   ESOPHAGOGASTRODUODENOSCOPY ENDOSCOPY  10/2012   HERNIA REPAIR     IMPLANTATION OF HYPOGLOSSAL NERVE STIMULATOR N/A 12/06/2023   Procedure: INSERTION, HYPOGLOSSAL NERVE STIMULATOR;  Surgeon:  Croak Penne, DO;  Location: [facility];  Service: ENT;  Laterality: N/A;  [employer] [facility]   IR 3D INDEPENDENT WKST  04/14/2020   IR ANGIO INTRA EXTRACRAN SEL COM CAROTID INNOMINATE BILAT MOD SED  08/09/2017   IR ANGIO INTRA EXTRACRAN SEL COM CAROTID INNOMINATE BILAT MOD SED  11/08/2018   IR ANGIO INTRA EXTRACRAN SEL COM CAROTID INNOMINATE BILAT MOD SED  04/14/2020   IR ANGIO INTRA EXTRACRAN SEL COM CAROTID INNOMINATE UNI L MOD SED  06/29/2020   IR ANGIO INTRA EXTRACRAN SEL INTERNAL CAROTID UNI L MOD SED  05/20/2020   IR ANGIO VERTEBRAL SEL VERTEBRAL BILAT MOD SED  08/09/2017   IR ANGIO VERTEBRAL SEL VERTEBRAL BILAT MOD SED  11/08/2018   IR ANGIO VERTEBRAL SEL VERTEBRAL BILAT MOD SED  04/14/2020   IR ANGIOGRAM FOLLOW UP STUDY  05/20/2020   IR INTRAVSC STENT CERV CAROTID W/EMB-PROT MOD SED INCL ANGIO  08/23/2017   IR INTRAVSC STENT CERV CAROTID W/EMB-PROT MOD SED INCL ANGIO  06/29/2020   IR RADIOLOGIST EVAL & MGMT  05/05/2020   IR RADIOLOGIST EVAL & MGMT  06/03/2020   IR RADIOLOGIST EVAL & MGMT  07/15/2020   IR TRANSCATH/EMBOLIZ  05/18/2020   IR US  GUIDE VASC ACCESS RIGHT  04/14/2020   IR US  GUIDE VASC ACCESS RIGHT  05/20/2020   IR US  GUIDE VASC ACCESS RIGHT  06/29/2020   LEFT HEART CATH  AND CORONARY ANGIOGRAPHY N/A 11/15/2018   Procedure: LEFT HEART CATH AND CORONARY ANGIOGRAPHY;  Surgeon: Mady Bruckner, MD;  [facility]: [facility];  Service: Cardiovascular;  Laterality: N/A;   PROSTATE SURGERY  1999   20 years ago, enlarged prostate   RADIOLOGY WITH ANESTHESIA N/A 08/23/2017   Procedure: IR WITH ANESTHESIA STENT PLACEMENT;  Surgeon: Dolphus Carrion, MD;  Location: [facility];  Service: Radiology;  Laterality: N/A;   RADIOLOGY WITH ANESTHESIA N/A 05/18/2020   Procedure: IR WITH ANESTHESIA EMBOLIZATION;  Surgeon: Dolphus Carrion, MD;  Location: [facility];  Service: Radiology;  Laterality: N/A;   RADIOLOGY WITH ANESTHESIA Left 06/29/2020   Procedure: RADIOLOGY WITH ANESTHESIA  LEFT CAROTID STENT PLACEMENT;  Surgeon:  Dolphus Carrion, MD;  Location: [facility];  Service: Radiology;  Laterality: Left;    Family History  Problem Relation Age of Onset   Stroke Mother 73   Aneurysm Father 1   Colon cancer Neg Hx    Esophageal cancer Neg Hx      Social Connections: Socially Integrated (08/22/2023)   Social Connection and Isolation Panel    Frequency of Communication with Friends and Family: More than three times a week    Frequency of Social Gatherings with Friends and Family: More than three times a week    Attends Religious Services: More than 4 times per year    Active Member of [employer] or Organizations: Yes    Attends Engineer, Structural: More than 4 times per year    Marital Status: Married      Current Outpatient Medications:    amLODipine  (NORVASC ) 5 MG tablet, Take 1 tablet (5 mg total) by mouth 2 (two) times daily., Disp: , Rfl:    aspirin  EC 81 MG tablet, Take 1 tablet (81 mg total) by mouth daily. Swallow whole., Disp: , Rfl:    cetirizine (ZYRTEC) 10 MG tablet, Take 0.5 tablets (5 mg total) by mouth at bedtime., Disp: 15 tablet, Rfl: 11   escitalopram  (LEXAPRO ) 20 MG tablet, Take 1 tablet (20 mg total) by mouth daily. (Patient taking differently: Take 20 mg by mouth at bedtime.), Disp: 90 tablet, Rfl: 3   famotidine  (PEPCID ) 20 MG tablet, Take 20 mg by mouth daily as needed for heartburn or indigestion., Disp: , Rfl:    isosorbide  mononitrate (IMDUR ) 30 MG 24 hr tablet, Take 0.5 tablets (15 mg total) by mouth daily. AS NEEDED (Patient taking differently: Take 15 mg by mouth daily as needed (for fatigue, to increase blood flow.).), Disp: 30 tablet, Rfl: 6   methylPREDNISolone  (MEDROL  DOSEPAK) 4 MG TBPK tablet, Take as directed, Disp: 1 each, Rfl: 0   nitroGLYCERIN  (NITROSTAT ) 0.4 MG SL tablet, Place 1 tablet (0.4 mg total) under the tongue every 5 (five) minutes as needed for chest pain., Disp: 25 tablet, Rfl: prn   rosuvastatin  (CRESTOR ) 40 MG tablet, Take one (1) tablet by mouth (40  mg) daily., Disp: 30 tablet, Rfl: 0   tamsulosin  (FLOMAX ) 0.4 MG CAPS capsule, Take one capsule once daily ( thirty minutes after same meal daily) (Patient taking differently: Take one capsule once daily ( thirty minutes after same meal daily) nightly), Disp: , Rfl:    Vonoprazan Fumarate (VOQUEZNA) 10 MG TABS, Take 10 mg by mouth daily., Disp: 90 tablet, Rfl: 1   Physical Exam:   BP 128/70 (BP Location: Left Arm, Patient Position: Sitting, Cuff Size: Large)   Pulse 84   Ht 5' 9 (1.753 m)   Wt 170 lb (77.1  kg)   SpO2 96%   BMI 25.10 kg/m   The patient was awake, alert, and appropriate. The external ears were inspected, and otoscopy was performed to evaluate the external auditory canals and tympanic membranes. The nasal cavity and septum were examined for mucosal changes, obstruction, or discharge. The oral cavity and oropharynx were inspected for mucosal lesions, infection, or tonsillar hypertrophy. The neck was palpated for lymphadenopathy, thyroid  abnormalities, or other masses. Cranial nerve function was grossly intact.  Pertinent Findings: Physical Exam No facial or tongue weakness. Incision CDI. No tenderness, flat cutaneous rash improving.    Impression & Plans:  John Salazar is a 78 y.o. male  1. Rash due to allergy   2. Contact dermatitis, unspecified contact dermatitis type, unspecified trigger    - Findings and diagnoses discussed in detail with the patient. - Risks, benefits, and alternatives were reviewed. Through shared decision making, the patient elects to proceed with below. Assessment & Plan Widespread allergic contact dermatitis -improving - continue zyrtec - recommend holding off on new ppi until rash has resolved.   Recommend half dose if he must reintroduce the new medication - follow up with pulmonologist - follow up with me in 3 months.   Thank you for allowing me the opportunity to care for your patient. Please do not hesitate to contact me should you have  any other questions.  Sincerely, Penne Croak, DO Otolaryngologist (ENT) [facility] Phone: [phone] Fax: [phone]  12/23/2023, 1:12 PM

## 2023-12-26 NOTE — Progress Notes (Signed)
 Agree with the assessment and plan as outlined by Boone Master, PA-C.

## 2024-01-01 ENCOUNTER — Other Ambulatory Visit: Payer: Self-pay

## 2024-01-01 ENCOUNTER — Ambulatory Visit: Admitting: Nurse Practitioner

## 2024-01-01 ENCOUNTER — Inpatient Hospital Stay (HOSPITAL_COMMUNITY)
Admission: EM | Admit: 2024-01-01 | Discharge: 2024-01-05 | DRG: 872 | Disposition: A | Discharge status: Home / Self Care | Attending: Internal Medicine | Admitting: Internal Medicine

## 2024-01-01 ENCOUNTER — Ambulatory Visit (INDEPENDENT_AMBULATORY_CARE_PROVIDER_SITE_OTHER)
Admission: RE | Admit: 2024-01-01 | Discharge: 2024-01-01 | Disposition: A | Source: Ambulatory Visit | Attending: Nurse Practitioner

## 2024-01-01 ENCOUNTER — Ambulatory Visit: Payer: Self-pay

## 2024-01-01 ENCOUNTER — Other Ambulatory Visit: Payer: Self-pay | Admitting: Nurse Practitioner

## 2024-01-01 VITALS — BP 110/60 | HR 101 | Temp 99.9°F | Ht 69.0 in | Wt 174.0 lb

## 2024-01-01 DIAGNOSIS — R0989 Other specified symptoms and signs involving the circulatory and respiratory systems: Secondary | ICD-10-CM | POA: Diagnosis not present

## 2024-01-01 DIAGNOSIS — I77810 Thoracic aortic ectasia: Secondary | ICD-10-CM | POA: Diagnosis present

## 2024-01-01 DIAGNOSIS — R410 Disorientation, unspecified: Secondary | ICD-10-CM

## 2024-01-01 DIAGNOSIS — F419 Anxiety disorder, unspecified: Secondary | ICD-10-CM | POA: Diagnosis present

## 2024-01-01 DIAGNOSIS — Z66 Do not resuscitate: Secondary | ICD-10-CM | POA: Diagnosis present

## 2024-01-01 DIAGNOSIS — E860 Dehydration: Principal | ICD-10-CM | POA: Diagnosis present

## 2024-01-01 DIAGNOSIS — R059 Cough, unspecified: Secondary | ICD-10-CM | POA: Diagnosis not present

## 2024-01-01 DIAGNOSIS — K5730 Diverticulosis of large intestine without perforation or abscess without bleeding: Secondary | ICD-10-CM | POA: Diagnosis present

## 2024-01-01 DIAGNOSIS — R1084 Generalized abdominal pain: Secondary | ICD-10-CM | POA: Diagnosis not present

## 2024-01-01 DIAGNOSIS — R509 Fever, unspecified: Secondary | ICD-10-CM

## 2024-01-01 DIAGNOSIS — F32A Depression, unspecified: Secondary | ICD-10-CM | POA: Diagnosis present

## 2024-01-01 DIAGNOSIS — N400 Enlarged prostate without lower urinary tract symptoms: Secondary | ICD-10-CM | POA: Diagnosis present

## 2024-01-01 DIAGNOSIS — I671 Cerebral aneurysm, nonruptured: Secondary | ICD-10-CM | POA: Diagnosis present

## 2024-01-01 DIAGNOSIS — I779 Disorder of arteries and arterioles, unspecified: Secondary | ICD-10-CM | POA: Diagnosis present

## 2024-01-01 DIAGNOSIS — E7800 Pure hypercholesterolemia, unspecified: Secondary | ICD-10-CM | POA: Diagnosis present

## 2024-01-01 DIAGNOSIS — J189 Pneumonia, unspecified organism: Secondary | ICD-10-CM

## 2024-01-01 DIAGNOSIS — R197 Diarrhea, unspecified: Secondary | ICD-10-CM | POA: Diagnosis present

## 2024-01-01 DIAGNOSIS — A0472 Enterocolitis due to Clostridium difficile, not specified as recurrent: Secondary | ICD-10-CM | POA: Diagnosis present

## 2024-01-01 DIAGNOSIS — Z7982 Long term (current) use of aspirin: Secondary | ICD-10-CM

## 2024-01-01 DIAGNOSIS — Z8711 Personal history of peptic ulcer disease: Secondary | ICD-10-CM

## 2024-01-01 DIAGNOSIS — K529 Noninfective gastroenteritis and colitis, unspecified: Secondary | ICD-10-CM

## 2024-01-01 DIAGNOSIS — J029 Acute pharyngitis, unspecified: Secondary | ICD-10-CM | POA: Diagnosis not present

## 2024-01-01 DIAGNOSIS — R52 Pain, unspecified: Secondary | ICD-10-CM

## 2024-01-01 DIAGNOSIS — Z9689 Presence of other specified functional implants: Secondary | ICD-10-CM | POA: Diagnosis present

## 2024-01-01 DIAGNOSIS — Z91030 Bee allergy status: Secondary | ICD-10-CM

## 2024-01-01 DIAGNOSIS — Z880 Allergy status to penicillin: Secondary | ICD-10-CM

## 2024-01-01 DIAGNOSIS — Z860100 Personal history of colon polyps, unspecified: Secondary | ICD-10-CM

## 2024-01-01 DIAGNOSIS — R918 Other nonspecific abnormal finding of lung field: Secondary | ICD-10-CM | POA: Diagnosis not present

## 2024-01-01 DIAGNOSIS — I25119 Atherosclerotic heart disease of native coronary artery with unspecified angina pectoris: Secondary | ICD-10-CM | POA: Diagnosis present

## 2024-01-01 DIAGNOSIS — I739 Peripheral vascular disease, unspecified: Secondary | ICD-10-CM | POA: Diagnosis present

## 2024-01-01 DIAGNOSIS — Z9889 Other specified postprocedural states: Secondary | ICD-10-CM

## 2024-01-01 DIAGNOSIS — Z8611 Personal history of tuberculosis: Secondary | ICD-10-CM

## 2024-01-01 DIAGNOSIS — E785 Hyperlipidemia, unspecified: Secondary | ICD-10-CM | POA: Diagnosis present

## 2024-01-01 DIAGNOSIS — Z87892 Personal history of anaphylaxis: Secondary | ICD-10-CM

## 2024-01-01 DIAGNOSIS — Z87891 Personal history of nicotine dependence: Secondary | ICD-10-CM

## 2024-01-01 DIAGNOSIS — Z87442 Personal history of urinary calculi: Secondary | ICD-10-CM

## 2024-01-01 DIAGNOSIS — Z961 Presence of intraocular lens: Secondary | ICD-10-CM | POA: Diagnosis present

## 2024-01-01 DIAGNOSIS — R051 Acute cough: Secondary | ICD-10-CM | POA: Diagnosis not present

## 2024-01-01 DIAGNOSIS — Z823 Family history of stroke: Secondary | ICD-10-CM

## 2024-01-01 DIAGNOSIS — Z8701 Personal history of pneumonia (recurrent): Secondary | ICD-10-CM

## 2024-01-01 DIAGNOSIS — Z9049 Acquired absence of other specified parts of digestive tract: Secondary | ICD-10-CM

## 2024-01-01 DIAGNOSIS — Z9842 Cataract extraction status, left eye: Secondary | ICD-10-CM

## 2024-01-01 DIAGNOSIS — G4733 Obstructive sleep apnea (adult) (pediatric): Secondary | ICD-10-CM | POA: Diagnosis present

## 2024-01-01 DIAGNOSIS — I6521 Occlusion and stenosis of right carotid artery: Secondary | ICD-10-CM | POA: Diagnosis present

## 2024-01-01 DIAGNOSIS — N179 Acute kidney failure, unspecified: Secondary | ICD-10-CM | POA: Diagnosis present

## 2024-01-01 DIAGNOSIS — Z79899 Other long term (current) drug therapy: Secondary | ICD-10-CM

## 2024-01-01 DIAGNOSIS — Z9841 Cataract extraction status, right eye: Secondary | ICD-10-CM

## 2024-01-01 DIAGNOSIS — I129 Hypertensive chronic kidney disease with stage 1 through stage 4 chronic kidney disease, or unspecified chronic kidney disease: Secondary | ICD-10-CM | POA: Diagnosis present

## 2024-01-01 DIAGNOSIS — Z95820 Peripheral vascular angioplasty status with implants and grafts: Secondary | ICD-10-CM

## 2024-01-01 DIAGNOSIS — A419 Sepsis, unspecified organism: Secondary | ICD-10-CM | POA: Diagnosis present

## 2024-01-01 DIAGNOSIS — A414 Sepsis due to anaerobes: Principal | ICD-10-CM | POA: Diagnosis present

## 2024-01-01 DIAGNOSIS — N1832 Chronic kidney disease, stage 3b: Secondary | ICD-10-CM | POA: Diagnosis present

## 2024-01-01 DIAGNOSIS — E872 Acidosis, unspecified: Secondary | ICD-10-CM | POA: Diagnosis present

## 2024-01-01 DIAGNOSIS — Z7902 Long term (current) use of antithrombotics/antiplatelets: Secondary | ICD-10-CM

## 2024-01-01 DIAGNOSIS — K2100 Gastro-esophageal reflux disease with esophagitis, without bleeding: Secondary | ICD-10-CM | POA: Diagnosis present

## 2024-01-01 LAB — CBC WITH DIFFERENTIAL/PLATELET
Basophils Absolute: 0.1 K/uL (ref 0.0–0.1)
Basophils Relative: 0.5 % (ref 0.0–3.0)
Eosinophils Absolute: 0 K/uL (ref 0.0–0.7)
Eosinophils Relative: 0.1 % (ref 0.0–5.0)
HCT: 37.6 % — ABNORMAL LOW (ref 39.0–52.0)
Hemoglobin: 12.6 g/dL — ABNORMAL LOW (ref 13.0–17.0)
Lymphocytes Relative: 4 % — ABNORMAL LOW (ref 12.0–46.0)
Lymphs Abs: 0.7 K/uL (ref 0.7–4.0)
MCHC: 33.4 g/dL (ref 30.0–36.0)
MCV: 88.9 fl (ref 78.0–100.0)
Monocytes Absolute: 1.5 K/uL — ABNORMAL HIGH (ref 0.1–1.0)
Monocytes Relative: 8.7 % (ref 3.0–12.0)
Neutro Abs: 14.9 K/uL — ABNORMAL HIGH (ref 1.4–7.7)
Neutrophils Relative %: 86.7 % — ABNORMAL HIGH (ref 43.0–77.0)
Platelets: 159 K/uL (ref 150.0–400.0)
RBC: 4.23 Mil/uL (ref 4.22–5.81)
RDW: 14.6 % (ref 11.5–15.5)
WBC: 17.2 K/uL — ABNORMAL HIGH (ref 4.0–10.5)

## 2024-01-01 LAB — COMPREHENSIVE METABOLIC PANEL WITH GFR
ALT: 22 U/L (ref 0–53)
AST: 17 U/L (ref 0–37)
Albumin: 4 g/dL (ref 3.5–5.2)
Alkaline Phosphatase: 72 U/L (ref 39–117)
BUN: 20 mg/dL (ref 6–23)
CO2: 26 meq/L (ref 19–32)
Calcium: 9.2 mg/dL (ref 8.4–10.5)
Chloride: 101 meq/L (ref 96–112)
Creatinine, Ser: 1.66 mg/dL — ABNORMAL HIGH (ref 0.40–1.50)
GFR: 39.03 mL/min — ABNORMAL LOW (ref >60.00)
Glucose, Bld: 120 mg/dL — ABNORMAL HIGH (ref 70–99)
Potassium: 4 meq/L (ref 3.5–5.1)
Sodium: 133 meq/L — ABNORMAL LOW (ref 135–145)
Total Bilirubin: 0.7 mg/dL (ref 0.2–1.2)
Total Protein: 6.8 g/dL (ref 6.0–8.3)

## 2024-01-01 LAB — POC COVID19 BINAXNOW: SARS Coronavirus 2 Ag: NEGATIVE

## 2024-01-01 LAB — POCT FLU A/B STATUS
Influenza A, POC: NEGATIVE
Influenza B, POC: NEGATIVE

## 2024-01-01 LAB — POCT RAPID STREP A (OFFICE): Rapid Strep A Screen: NEGATIVE

## 2024-01-01 LAB — AMMONIA: Ammonia: 26 umol/L (ref 11–35)

## 2024-01-01 LAB — LIPASE: Lipase: 10 U/L — ABNORMAL LOW (ref 11.0–59.0)

## 2024-01-01 MED ORDER — ONDANSETRON HCL 4 MG/2ML IJ SOLN
4.0000 mg | Freq: Once | INTRAMUSCULAR | Status: AC
  Administered 2024-01-01: 4 mg via INTRAVENOUS
  Filled 2024-01-01: qty 2

## 2024-01-01 MED ORDER — CEFPODOXIME PROXETIL 200 MG PO TABS: 200.0000 mg | ORAL_TABLET | Freq: Two times a day (BID) | ORAL | 0 refills | Status: DC

## 2024-01-01 MED ORDER — AZITHROMYCIN 250 MG PO TABS: ORAL_TABLET | ORAL | 0 refills | Status: DC

## 2024-01-01 MED ORDER — LACTATED RINGERS IV BOLUS
1000.0000 mL | Freq: Once | INTRAVENOUS | Status: AC
  Administered 2024-01-01: 1000 mL via INTRAVENOUS

## 2024-01-01 NOTE — Telephone Encounter (Signed)
Patient evaluated in office 

## 2024-01-01 NOTE — ED Notes (Addendum)
 Patient walked to the doorway of Triage 2. Tech speaking to patient letting him know that we need to get him in a wheelchair and will assist him to the restroom. Patient insisted on walking to the restroom and became weak with tech physically holding patients left arm beside him. Tech assisted patient fall to the floor and stated patient did not hit head or no injury at this time per patient as well.  Upon staff assessment patient stated he did not get injured or hit his head. Patient assisted to the stretcher by staff. Patient stated he needed to get to the bathroom due to his diarrhea. Patient assisted to the restroom. Patient axo x 4  Charge aware.

## 2024-01-01 NOTE — Patient Instructions (Signed)
 Nice to see you today  I will be in touch with the labs once I have them  Follow up with if you do not improve

## 2024-01-01 NOTE — ED Triage Notes (Signed)
 Patient brought in by EMS.  Per EMS patientc/o abdominal pain and has had n/v today and diarrhea x2 days recently dx with pneumonia today and started on 2 abx  and unable to keep anything down. Started on z pack and cefopodoxime. Ran fever prior to arrival of medics 102.0 T 1000 mg at home of tylenol  last temp 98.1. BS 125

## 2024-01-01 NOTE — ED Provider Notes (Incomplete)
 [place] EMERGENCY DEPARTMENT AT [facility] Provider Note   CSN: [id number] Arrival date & time: 01/01/24  2256     History Chief Complaint  Patient presents with  . Vomiting  . Diarrhea    HPI John Salazar is a 78 y.o. male presenting for chief complaint of severe vomitting and diarrhea.   Patient's recorded medical, surgical, social, medication list and allergies were reviewed in the Snapshot window as part of the initial history.   Review of Systems   Review of Systems  Physical Exam Updated Vital Signs Pulse 97   Temp 99.3 F (37.4 C) (Oral)   SpO2 99%  Physical Exam   ED Course/ Medical Decision Making/ A&P    Procedures Procedures   Medications Ordered in ED Medications - No data to display  Medical Decision Making:   John Salazar is a 78 y.o. male who presented to the ED today with *** detailed above.    {crccomplexity:27900} Complete initial physical exam performed, notably the patient  was ***.    Reviewed and confirmed nursing documentation for past medical history, family history, social history.    Initial Assessment:   With the patient's presentation of ***, most likely diagnosis is ***. Other diagnoses were considered including (but not limited to) ***. These are considered less likely due to history of present illness and physical exam findings.   {crccopa:27899}  Initial Plan:  ***  ***Screening labs including CBC and Metabolic panel to evaluate for infectious or metabolic etiology of disease.  ***Urinalysis with reflex culture ordered to evaluate for UTI or relevant urologic/nephrologic pathology.  ***CXR to evaluate for structural/infectious intrathoracic pathology.  {crccardiactesting:32591::EKG to evaluate for cardiac pathology} Objective evaluation as below reviewed   Initial Study Results:   Laboratory  All laboratory results reviewed without evidence of clinically relevant pathology.   ***Exceptions include:  ***   ***EKG EKG was reviewed independently. Rate, rhythm, axis, intervals all examined and without medically relevant abnormality. ST segments without concerns for elevations.    Radiology:  All images reviewed independently. ***Agree with radiology report at this time.   DG Chest 2 View Result Date: 01/01/2024 CLINICAL DATA:  Cough, fever, and rhonchi EXAM: CHEST - 2 VIEW COMPARISON:  Chest radiograph dated 12/06/2023 FINDINGS: Normal lung volumes. Hazy opacity projecting over the right lower lung. No pleural effusion or pneumothorax. The heart size and mediastinal contours are within normal limits. No acute osseous abnormality. Nerve stimulator generator projects over the anteromedial right chest with lead terminating above the field of view. IMPRESSION: Hazy opacity projecting over the right lower lung, which may represent atelectasis or pneumonia. Electronically Signed   By: Limin  Xu M.D.   On: 01/01/2024 13:52   X-ray chest PA or AP Result Date: 12/06/2023 EXAM: 1 VIEW(S) XRAY OF THE CHEST 12/06/2023 06:07:00 PM COMPARISON: 10/23/2023 CLINICAL HISTORY: 357714 Pneumothorax 357714. Pneumothorax; Post op pacu imaging Pneumothorax; Post op pacu imaging FINDINGS: LINES, TUBES AND DEVICES: Stimulator device is seen over right chest lead extending to right neck. LUNGS AND PLEURA: Minimal bibasilar subsegmental atelectasis is noted. No pulmonary edema. No pleural effusion. No pneumothorax. HEART AND MEDIASTINUM: No acute abnormality of the cardiac and mediastinal silhouettes. BONES AND SOFT TISSUES: No acute osseous abnormality. IMPRESSION: 1. No acute cardiopulmonary process. 2. Minimal bibasilar subsegmental atelectasis. 3. Right chest stimulator device with lead extending into the right neck. Electronically signed by: Lynwood Seip MD 12/06/2023 06:15 PM EDT RP Workstation: [id number]      Consults: Case  discussed with ***.   Reassessment and Plan:   ***    ***  Clinical Impression: No  diagnosis found.   Data Unavailable   Final Clinical Impression(s) / ED Diagnoses Final diagnoses:  None    Rx / DC Orders ED Discharge Orders     None

## 2024-01-01 NOTE — Progress Notes (Signed)
 Called patients number and left message. Called patients spouse phone number and left message  Called spouse (pam on [place]) reviewed labs and xray. Strict precautions given. Antibiotics sent to pharmacy

## 2024-01-01 NOTE — Progress Notes (Signed)
 Established Patient Office Visit  Subjective   Patient ID: John Salazar, male    DOB: January 05, 1946  Age: 78 y.o. MRN: [id number]  Chief Complaint  Patient presents with   Cough    Pt complains of cough that started last Monday. States of moving lawn that day and started coughing since then. Taken OTC cough medicine but no change. Pt complains of diarrhea, fever, chills, body aches. And sore throat.        With a history of TIA, CAD, HTN, OSA, CKD, former tobacco use  Discussed the use of AI scribe software for clinical note transcription with the patient, who gave verbal consent to proceed.  History of Present Illness John Salazar is a 78 year old male who presents with fever, sore throat, and confusion.  His symptoms began a week ago with a mild sore throat that worsens in the evenings. He has been taking over-the-counter medications without significant relief.  This morning, he experienced imbalance while preparing to visit the doctor. He also had a high fever last night, reaching 100.54F this morning, accompanied by excessive thirst and drinking water throughout the night.  He has been experiencing a non-productive cough, particularly at night. He has tried over-the-counter cough medications, including Pertussin and Cheritussin with codeine, but they have not been very effective. He is cautious with the codeine as it can cause nausea if taken excessively.  He reports diarrhea this morning, with abdominal pain and frequent bathroom use, approximately ten times. No nausea or vomiting, but he experiences headaches when coughing. He also reports body aches and feeling cold, leading him to wear multiple layers of clothing.  He has not received a flu shot this season. There have been reports of confusion and slow responses, which have been more pronounced today. This confusion has been present to some extent before his recent Inspire surgery, which occurred towards the end of  October.  His current medications include amlodipine , baby aspirin , Zyrtec , Lexapro , Pepcid , isosorbide  mononitrate, rosuvastatin , Flomax , and Voquazna. He has a known allergy to penicillin and bee venom.     Review of Systems  Constitutional:  Positive for chills, fever and malaise/fatigue.  HENT:  Negative for ear discharge and ear pain.   Respiratory:  Positive for cough.   Gastrointestinal:  Positive for abdominal pain and diarrhea. Negative for nausea and vomiting.  Musculoskeletal:  Positive for myalgias.  Neurological:  Positive for headaches.      Objective:     BP 110/60   Pulse (!) 101   Temp 99.9 F (37.7 C) (Oral)   Ht 5' 9 (1.753 m)   Wt 174 lb (78.9 kg)   SpO2 93%   BMI 25.70 kg/m  BP Readings from Last 3 Encounters:  01/01/24 110/60  12/22/23 128/70  12/19/23 (!) 147/81   Wt Readings from Last 3 Encounters:  01/01/24 174 lb (78.9 kg)  12/22/23 170 lb (77.1 kg)  12/19/23 170 lb (77.1 kg)   SpO2 Readings from Last 3 Encounters:  01/01/24 93%  12/22/23 96%  12/19/23 98%      Physical Exam Vitals and nursing note reviewed.  Constitutional:      Appearance: Normal appearance.  HENT:     Right Ear: Tympanic membrane, ear canal and external ear normal.     Left Ear: Tympanic membrane, ear canal and external ear normal.     Nose:     Right Sinus: No maxillary sinus tenderness or frontal sinus tenderness.  Left Sinus: No maxillary sinus tenderness or frontal sinus tenderness.     Mouth/Throat:     Mouth: Mucous membranes are moist.     Pharynx: Oropharynx is clear.  Eyes:     Extraocular Movements: Extraocular movements intact.     Pupils: Pupils are equal, round, and reactive to light.  Cardiovascular:     Rate and Rhythm: Normal rate and regular rhythm.     Heart sounds: Normal heart sounds.  Pulmonary:     Effort: Pulmonary effort is normal.     Breath sounds: Rhonchi (Left lung) present.  Abdominal:     General: Bowel sounds are  normal.     Tenderness: There is abdominal tenderness.  Lymphadenopathy:     Cervical: No cervical adenopathy.  Neurological:     General: No focal deficit present.     Mental Status: He is alert and oriented to person, place, and time.     Cranial Nerves: Cranial nerves 2-12 are intact.     Sensory: Sensation is intact.     Motor: Motor function is intact.     Coordination: Coordination is intact.     Gait: Tandem walk abnormal.     Deep Tendon Reflexes:     Reflex Scores:      Bicep reflexes are 2+ on the right side and 2+ on the left side.      Patellar reflexes are 2+ on the right side and 2+ on the left side.    Comments: Bilateral upper and lower extremity strength 5/5      No results found for any visits on 01/01/24.    The ASCVD Risk score (Arnett DK, et al., 2019) failed to calculate for the following reasons:   The valid total cholesterol range is 130 to 320 mg/dL    Assessment & Plan:   Problem List Items Addressed This Visit       Other   Acute cough   Relevant Orders   POC COVID-19 BinaxNow   POCT Flu A & B Status   DG Chest 2 View   Other Visit Diagnoses       Fever, unspecified fever cause    -  Primary   Relevant Orders   Rapid Strep A   POC COVID-19 BinaxNow   POCT Flu A & B Status   CBC with Differential/Platelet   Comprehensive metabolic panel with GFR   DG Chest 2 View   Urinalysis w microscopic + reflex cultur     Confusion       Relevant Orders   Urinalysis w microscopic + reflex cultur   Ammonia     Sore throat       Relevant Orders   Rapid Strep A   POC COVID-19 BinaxNow   POCT Flu A & B Status     Body aches       Relevant Orders   POC COVID-19 BinaxNow   POCT Flu A & B Status     Abdominal pain, generalized       Relevant Orders   CBC with Differential/Platelet   Comprehensive metabolic panel with GFR   Lipase      Assessment and Plan Assessment & Plan Acute febrile illness with confusion and respiratory and  gastrointestinal symptoms Acute febrile illness with confusion, imbalance, sore throat, cough, fever, diarrhea, and body aches. Confusion may be due to febrile delirium. Recent surgery may complicate diagnosis. No evidence of stroke. Neurological examination intact. Left lung sounds indicate possible mucus presence.  Negative for strep, flu, and COVID. Allergy to penicillins and bee venom. Previous intolerance to doxycycline . Cefdinir  tolerated well previously. - Ordered chest x-ray. - Ordered blood tests. - Ordered urine tests. - Administer Tylenol  1000 mg every 6 hours as needed for fever. - Avoid ibuprofen due to kidney function concerns. - Consider cefdinir  for empirical treatment pending test results.  Recent Inspire device implantation (upper airway stimulation) Recent Inspire device implantation for upper airway stimulation. Confusion and symptoms likely related to acute febrile illness rather than device complications.  Return if symptoms worsen or fail to improve.    Adina Crandall, NP

## 2024-01-01 NOTE — ED Provider Notes (Signed)
 [place] EMERGENCY DEPARTMENT AT [facility] Provider Note   CSN: [id number] Arrival date & time: 01/01/24  2256     History Chief Complaint  Patient presents with   Vomiting   Diarrhea    HPI John Salazar is a 78 y.o. male presenting for chief complaint of severe vomitting and diarrhea. Has been on and off antibiotics all month due to recurrent URI sxs. Diarrhea and vomiting   Patient's recorded medical, surgical, social, medication list and allergies were reviewed in the Snapshot window as part of the initial history.   Review of Systems   Review of Systems  Constitutional:  Negative for chills and fever.  HENT:  Negative for ear pain and sore throat.   Eyes:  Negative for pain and visual disturbance.  Respiratory:  Negative for cough and shortness of breath.   Cardiovascular:  Negative for chest pain and palpitations.  Gastrointestinal:  Positive for abdominal pain, diarrhea and nausea. Negative for vomiting.  Genitourinary:  Negative for dysuria and hematuria.  Musculoskeletal:  Negative for arthralgias and back pain.  Skin:  Negative for color change and rash.  Neurological:  Negative for seizures and syncope.  All other systems reviewed and are negative.   Physical Exam Updated Vital Signs BP 111/62   Pulse 85   Temp 98.7 F (37.1 C) (Rectal)   Resp 15   SpO2 92%  Physical Exam Vitals and nursing note reviewed.  Constitutional:      General: He is not in acute distress.    Appearance: He is well-developed.  HENT:     Head: Normocephalic and atraumatic.  Eyes:     Conjunctiva/sclera: Conjunctivae normal.  Cardiovascular:     Rate and Rhythm: Normal rate and regular rhythm.     Heart sounds: No murmur heard. Pulmonary:     Effort: Pulmonary effort is normal. No respiratory distress.     Breath sounds: Normal breath sounds.  Abdominal:     Palpations: Abdomen is soft.     Tenderness: There is no abdominal tenderness.  Musculoskeletal:         General: No swelling.     Cervical back: Neck supple.  Skin:    General: Skin is warm and dry.     Capillary Refill: Capillary refill takes less than 2 seconds.  Neurological:     Mental Status: He is alert.  Psychiatric:        Mood and Affect: Mood normal.      ED Course/ Medical Decision Making/ A&P Clinical Course as of 01/02/24 0546  Mon Jan 01, 2024  2340 Fever diarrhea vomitting [CC]    Clinical Course User Index [CC] Jerral Meth, MD    Procedures .Critical Care  Performed by: Jerral Meth, MD Authorized by: Jerral Meth, MD   Critical care provider statement:    Critical care time (minutes):  30   Critical care was necessary to treat or prevent imminent or life-threatening deterioration of the following conditions:  Sepsis   Critical care was time spent personally by me on the following activities:  Development of treatment plan with patient or surrogate, discussions with consultants, evaluation of patient's response to treatment, examination of patient, ordering and review of laboratory studies, ordering and review of radiographic studies, ordering and performing treatments and interventions, pulse oximetry, re-evaluation of patient's condition and review of old charts    Medications Ordered in ED Medications  vancomycin  (VANCOREADY) IVPB 1500 mg/300 mL (1,500 mg Intravenous New Bag/Given 01/02/24 0411)  diphenhydrAMINE  (BENADRYL ) injection 25 mg (0 mg Intravenous Hold 01/02/24 0242)  ondansetron  (ZOFRAN ) injection 4 mg (4 mg Intravenous Given 01/01/24 2355)  lactated ringers  bolus 1,000 mL (0 mLs Intravenous Stopped 01/02/24 0100)  ceFEPIme  (MAXIPIME ) 2 g in sodium chloride  0.9 % 100 mL IVPB (0 g Intravenous Stopped 01/02/24 0357)  metroNIDAZOLE  (FLAGYL ) IVPB 500 mg (0 mg Intravenous Stopped 01/02/24 0405)  iohexol  (OMNIPAQUE ) 300 MG/ML solution 75 mL (75 mLs Intravenous Contrast Given 01/02/24 0218)   Medical Decision Making:   John Salazar is a 78 y.o. male who presented to the ED today with abdominal pain, detailed above.    Patient placed on continuous vitals and telemetry monitoring while in ED which was reviewed periodically.  Complete initial physical exam performed, notably the patient  was HDS in NAD.     Reviewed and confirmed nursing documentation for past medical history, family history, social history.    Initial Assessment:   With the patient's presentation of abdominal pain, most likely diagnosis is nonspecific etiology. Other diagnoses were considered including (but not limited to) gastroenteritis, colitis, small bowel obstruction, appendicitis, cholecystitis, pancreatitis, nephrolithiasis, UTI, pyleonephritis. These are considered less likely due to history of present illness and physical exam findings.   This is most consistent with an acute life/limb threatening illness complicated by underlying chronic conditions.   Initial Plan:  CBC/CMP to evaluate for underlying infectious/metabolic etiology for patient's abdominal pain  Lipase to evaluate for pancreatitis  EKG to evaluate for cardiac source of pain  CTAB/Pelvis with contrast to evaluate for structural/surgical etiology of patients' severe abdominal pain.  Urinalysis and repeat physical assessment to evaluate for UTI/Pyelonpehritis  Empiric management of symptoms with escalating pain control and antiemetics as needed.   Initial Study Results:   Laboratory  All laboratory results reviewed without evidence of clinically relevant pathology.    EKG EKG was reviewed independently. Rate, rhythm, axis, intervals all examined and without medically relevant abnormality. ST segments without concerns for elevations.    Radiology All images reviewed independently. Agree with radiology report at this time.   CT CHEST ABDOMEN PELVIS W CONTRAST Result Date: 01/02/2024 EXAM: CT CHEST, ABDOMEN AND PELVIS WITH CONTRAST 01/02/2024 02:38:39 AM TECHNIQUE: CT of the  chest, abdomen and pelvis was performed with the administration of 75 mL of iohexol  (OMNIPAQUE ) 300 MG/ML solution. Multiplanar reformatted images are provided for review. Automated exposure control, iterative reconstruction, and/or weight based adjustment of the mA/kV was utilized to reduce the radiation dose to as low as reasonably achievable. COMPARISON: 05/04/2020 CLINICAL HISTORY: Abdominal pain and nausea and vomiting for 2 days. FINDINGS: CHEST: MEDIASTINUM AND LYMPH NODES: Heart and pericardium are unremarkable. No cardiac enlargement is noted. Coronary calcifications are seen. The central airways are clear. No large central pulmonary embolus is noted. Thoracic inlet is within normal limits. No hilar or mediastinal adenopathy is noted. The esophagus is within normal limits. LUNGS AND PLEURA: Lungs are well aerated bilaterally. No focal infiltrate Or sizable effusion is noted. Diffuse emphysematous changes are seen. No sizable parenchymal nodule is noted. No pneumothorax. ABDOMEN AND PELVIS: LIVER: The liver is within normal limits, with the exception of a few small left lobe cysts stable from the prior exam. GALLBLADDER AND BILE DUCTS: The gallbladder is within normal limits. No biliary ductal dilatation. SPLEEN: The spleen is within normal limits. PANCREAS: The pancreas is within normal limits. ADRENAL GLANDS: Adrenal glands are unremarkable. KIDNEYS, URETERS AND BLADDER: Bilateral renal cysts are seen and stable. No follow-up is recommended.  No renal calculi or obstructive changes are seen. No hydronephrosis. The bladder is well distended. GI AND BOWEL: Stomach and small bowel are within normal limits. No obstructive or inflammatory changes of the colon are noted. Diverticular changes seen without evidence of diverticulitis. The appendix is not visualized, consistent with the prior surgical history. There is no bowel obstruction. REPRODUCTIVE ORGANS: The prostate is within normal limits. PERITONEUM AND  RETROPERITONEUM: No ascites. No free air. VASCULATURE: Atherosclerotic calcifications of the thoracic aorta and its branches are seen. Abdominal Aortic calcifications are noted. ABDOMINAL AND PELVIS LYMPH NODES: No lymphadenopathy. BONES AND SOFT TISSUES: Degenerative changes of the thoracic spine are seen. IMPRESSION: 1. Diverticulosis without diverticulitis. 2. No acute abnormality noted. Electronically signed by: Oneil Devonshire MD 01/02/2024 02:55 AM EST RP Workstation: [id number]   DG Chest 2 View Result Date: 01/01/2024 CLINICAL DATA:  Cough, fever, and rhonchi EXAM: CHEST - 2 VIEW COMPARISON:  Chest radiograph dated 12/06/2023 FINDINGS: Normal lung volumes. Hazy opacity projecting over the right lower lung. No pleural effusion or pneumothorax. The heart size and mediastinal contours are within normal limits. No acute osseous abnormality. Nerve stimulator generator projects over the anteromedial right chest with lead terminating above the field of view. IMPRESSION: Hazy opacity projecting over the right lower lung, which may represent atelectasis or pneumonia. Electronically Signed   By: Limin  Xu M.D.   On: 01/01/2024 13:52   X-ray chest PA or AP Result Date: 12/06/2023 EXAM: 1 VIEW(S) XRAY OF THE CHEST 12/06/2023 06:07:00 PM COMPARISON: 10/23/2023 CLINICAL HISTORY: [id number] Pneumothorax [id number]. Pneumothorax; Post op pacu imaging Pneumothorax; Post op pacu imaging FINDINGS: LINES, TUBES AND DEVICES: Stimulator device is seen over right chest lead extending to right neck. LUNGS AND PLEURA: Minimal bibasilar subsegmental atelectasis is noted. No pulmonary edema. No pleural effusion. No pneumothorax. HEART AND MEDIASTINUM: No acute abnormality of the cardiac and mediastinal silhouettes. BONES AND SOFT TISSUES: No acute osseous abnormality. IMPRESSION: 1. No acute cardiopulmonary process. 2. Minimal bibasilar subsegmental atelectasis. 3. Right chest stimulator device with lead extending into the right neck.  Electronically signed by: Lynwood Seip MD 12/06/2023 06:15 PM EDT RP Workstation: [id number]    Final Reassessment and Plan:   Patient's history of present on his physicals and findings are most consistent with likely colitis.  He is having substantial diarrheal disease.  He appears dehydrated with slight AKI that is prerenal.  Treating with IV fluid.  He has been broadly treated for possible sepsis given leukocytosis, tachycardia on presentation as well as initial lactic acid being positive. Clinically, patient is improving.  Still pending confirmation of suspected diagnosis with stool testing.  Arrange for admission in the interim.  Disposition:   Based on the above findings, I believe this patient is stable for admission.    Patient/family educated about specific findings on our evaluation and explained exact reasons for admission.  Patient/family educated about clinical situation and time was allowed to answer questions.   Admission team communicated with and agreed with need for admission. Patient admitted. Patient  ready to move at this time.     Emergency Department Medication Summary:   Medications  vancomycin  (VANCOREADY) IVPB 1500 mg/300 mL (1,500 mg Intravenous New Bag/Given 01/02/24 0411)  diphenhydrAMINE  (BENADRYL ) injection 25 mg (0 mg Intravenous Hold 01/02/24 0242)  ondansetron  (ZOFRAN ) injection 4 mg (4 mg Intravenous Given 01/01/24 2355)  lactated ringers  bolus 1,000 mL (0 mLs Intravenous Stopped 01/02/24 0100)  ceFEPIme  (MAXIPIME ) 2 g in sodium chloride  0.9 % 100 mL IVPB (0  g Intravenous Stopped 01/02/24 0357)  metroNIDAZOLE  (FLAGYL ) IVPB 500 mg (0 mg Intravenous Stopped 01/02/24 0405)  iohexol  (OMNIPAQUE ) 300 MG/ML solution 75 mL (75 mLs Intravenous Contrast Given 01/02/24 0218)            Clinical Impression:  1. Dehydration   2. Colitis      Admit   Final Clinical Impression(s) / ED Diagnoses Final diagnoses:  Dehydration  Colitis    Rx / DC Orders ED  Discharge Orders     None         Jerral Meth, MD 01/02/24 [phone]

## 2024-01-01 NOTE — ED Notes (Addendum)
 Pt got up from wheelchair to try to use bathroom. This tech told pt multiple times to return to wheelchair, pt refused saying he wanted to walk. This tech told pt that he would fall if he tried to walk and tried to assist pt into wheelchair. Pt began to fall. This tech assisted fall and protected pt's head during fall. Pt did not hit head.

## 2024-01-01 NOTE — Telephone Encounter (Signed)
 FYI Only or Action Required?: FYI only for provider: appointment scheduled on today.  Patient was last seen in primary care on 10/23/2023 by Wendee Lynwood HERO, NP.  Called Nurse Triage reporting Cough, fever BA. Pt has both lung and heart issues  Symptoms began a week ago.  Interventions attempted: OTC medications:  SABRA  Symptoms are: gradually worsening.  Triage Disposition: See Physician Within 24 Hours  Patient/caregiver understands and will follow disposition?: yes                    Copied from CRM #[id number]. Topic: Clinical - Red Word Triage >> Jan 01, 2024  7:42 AM Ahlexyia S wrote: Red Word that prompted transfer to Nurse Triage: Pt wife Pam called in stating that pt is having a worsening cough, possibly a fever (pt hasn't checked it), chest congestion, dizziness and achy ness. Pt also mentioned having headaches. Pt denied having any other symptoms. Warm transferred to nurse triage. Reason for Disposition  Fever present > 3 days (72 hours)  Answer Assessment - Initial Assessment Questions 1. ONSET: When did the cough begin?      Last week 2. SEVERITY: How bad is the cough today?      Worse at night - every 10 minutes 3. SPUTUM: Describe the color of your sputum (e.g., none, dry cough; clear, white, yellow, green)     none 4. HEMOPTYSIS: Are you coughing up any blood? If Yes, ask: How much? (e.g., flecks, streaks, tablespoons, etc.)     no 5. DIFFICULTY BREATHING: Are you having difficulty breathing? If Yes, ask: How bad is it? (e.g., mild, moderate, severe)      no 6. FEVER: Do you have a fever? If Yes, ask: What is your temperature, how was it measured, and when did it start?     Thinks so 7. CARDIAC HISTORY: Do you have any history of heart disease? (e.g., heart attack, congestive heart failure)      yes 8. LUNG HISTORY: Do you have any history of lung disease?  (e.g., pulmonary embolus, asthma, emphysema)     yes 10. OTHER SYMPTOMS:  Do you have any other symptoms? (e.g., runny nose, wheezing, chest pain)       Dizziness, BA  Protocols used: Cough - Acute Non-Productive-A-AH

## 2024-01-02 ENCOUNTER — Encounter (HOSPITAL_COMMUNITY): Payer: Self-pay

## 2024-01-02 ENCOUNTER — Ambulatory Visit: Payer: Self-pay | Admitting: Nurse Practitioner

## 2024-01-02 ENCOUNTER — Telehealth: Payer: Self-pay | Admitting: Nurse Practitioner

## 2024-01-02 ENCOUNTER — Emergency Department (HOSPITAL_COMMUNITY)

## 2024-01-02 DIAGNOSIS — Z66 Do not resuscitate: Secondary | ICD-10-CM | POA: Diagnosis not present

## 2024-01-02 DIAGNOSIS — Z9689 Presence of other specified functional implants: Secondary | ICD-10-CM | POA: Diagnosis not present

## 2024-01-02 DIAGNOSIS — E860 Dehydration: Secondary | ICD-10-CM | POA: Diagnosis not present

## 2024-01-02 DIAGNOSIS — Z961 Presence of intraocular lens: Secondary | ICD-10-CM | POA: Diagnosis not present

## 2024-01-02 DIAGNOSIS — F32A Depression, unspecified: Secondary | ICD-10-CM | POA: Insufficient documentation

## 2024-01-02 DIAGNOSIS — I671 Cerebral aneurysm, nonruptured: Secondary | ICD-10-CM | POA: Diagnosis not present

## 2024-01-02 DIAGNOSIS — Z7902 Long term (current) use of antithrombotics/antiplatelets: Secondary | ICD-10-CM | POA: Diagnosis not present

## 2024-01-02 DIAGNOSIS — A414 Sepsis due to anaerobes: Secondary | ICD-10-CM | POA: Diagnosis not present

## 2024-01-02 DIAGNOSIS — A419 Sepsis, unspecified organism: Secondary | ICD-10-CM | POA: Diagnosis present

## 2024-01-02 DIAGNOSIS — K5730 Diverticulosis of large intestine without perforation or abscess without bleeding: Secondary | ICD-10-CM | POA: Diagnosis not present

## 2024-01-02 DIAGNOSIS — A0472 Enterocolitis due to Clostridium difficile, not specified as recurrent: Secondary | ICD-10-CM | POA: Diagnosis not present

## 2024-01-02 DIAGNOSIS — J9600 Acute respiratory failure, unspecified whether with hypoxia or hypercapnia: Secondary | ICD-10-CM | POA: Diagnosis not present

## 2024-01-02 DIAGNOSIS — F419 Anxiety disorder, unspecified: Secondary | ICD-10-CM | POA: Diagnosis not present

## 2024-01-02 DIAGNOSIS — I739 Peripheral vascular disease, unspecified: Secondary | ICD-10-CM | POA: Diagnosis not present

## 2024-01-02 DIAGNOSIS — I6521 Occlusion and stenosis of right carotid artery: Secondary | ICD-10-CM | POA: Diagnosis not present

## 2024-01-02 DIAGNOSIS — E872 Acidosis, unspecified: Secondary | ICD-10-CM | POA: Diagnosis not present

## 2024-01-02 DIAGNOSIS — N179 Acute kidney failure, unspecified: Secondary | ICD-10-CM | POA: Insufficient documentation

## 2024-01-02 DIAGNOSIS — K2100 Gastro-esophageal reflux disease with esophagitis, without bleeding: Secondary | ICD-10-CM | POA: Diagnosis not present

## 2024-01-02 DIAGNOSIS — G4733 Obstructive sleep apnea (adult) (pediatric): Secondary | ICD-10-CM | POA: Diagnosis not present

## 2024-01-02 DIAGNOSIS — I25119 Atherosclerotic heart disease of native coronary artery with unspecified angina pectoris: Secondary | ICD-10-CM | POA: Diagnosis not present

## 2024-01-02 DIAGNOSIS — I77810 Thoracic aortic ectasia: Secondary | ICD-10-CM | POA: Diagnosis not present

## 2024-01-02 DIAGNOSIS — R6520 Severe sepsis without septic shock: Secondary | ICD-10-CM

## 2024-01-02 DIAGNOSIS — I129 Hypertensive chronic kidney disease with stage 1 through stage 4 chronic kidney disease, or unspecified chronic kidney disease: Secondary | ICD-10-CM | POA: Diagnosis not present

## 2024-01-02 DIAGNOSIS — N1832 Chronic kidney disease, stage 3b: Secondary | ICD-10-CM | POA: Diagnosis not present

## 2024-01-02 DIAGNOSIS — E7800 Pure hypercholesterolemia, unspecified: Secondary | ICD-10-CM | POA: Diagnosis not present

## 2024-01-02 DIAGNOSIS — N400 Enlarged prostate without lower urinary tract symptoms: Secondary | ICD-10-CM | POA: Diagnosis not present

## 2024-01-02 LAB — CBC WITH DIFFERENTIAL/PLATELET
Abs Immature Granulocytes: 0.13 K/uL — ABNORMAL HIGH (ref 0.00–0.07)
Basophils Absolute: 0 K/uL (ref 0.0–0.1)
Basophils Relative: 0 %
Eosinophils Absolute: 0 K/uL (ref 0.0–0.5)
Eosinophils Relative: 0 %
HCT: 40.9 % (ref 39.0–52.0)
Hemoglobin: 13.5 g/dL (ref 13.0–17.0)
Immature Granulocytes: 1 %
Lymphocytes Relative: 4 %
Lymphs Abs: 0.6 K/uL — ABNORMAL LOW (ref 0.7–4.0)
MCH: 30.3 pg (ref 26.0–34.0)
MCHC: 33 g/dL (ref 30.0–36.0)
MCV: 91.7 fL (ref 80.0–100.0)
Monocytes Absolute: 0.7 K/uL (ref 0.1–1.0)
Monocytes Relative: 4 %
Neutro Abs: 15.9 K/uL — ABNORMAL HIGH (ref 1.7–7.7)
Neutrophils Relative %: 91 %
Platelets: 158 K/uL (ref 150–400)
RBC: 4.46 MIL/uL (ref 4.22–5.81)
RDW: 14.7 % (ref 11.5–15.5)
WBC: 17.4 K/uL — ABNORMAL HIGH (ref 4.0–10.5)
nRBC: 0 % (ref 0.0–0.2)

## 2024-01-02 LAB — COMPREHENSIVE METABOLIC PANEL WITH GFR
ALT: 25 U/L (ref 0–44)
AST: 21 U/L (ref 15–41)
Albumin: 4.1 g/dL (ref 3.5–5.0)
Alkaline Phosphatase: 91 U/L (ref 38–126)
Anion gap: 14 (ref 5–15)
BUN: 23 mg/dL (ref 8–23)
CO2: 21 mmol/L — ABNORMAL LOW (ref 22–32)
Calcium: 10.1 mg/dL (ref 8.9–10.3)
Chloride: 98 mmol/L (ref 98–111)
Creatinine, Ser: 1.96 mg/dL — ABNORMAL HIGH (ref 0.61–1.24)
GFR, Estimated: 34 mL/min — ABNORMAL LOW (ref >60)
Glucose, Bld: 130 mg/dL — ABNORMAL HIGH (ref 70–99)
Potassium: 3.6 mmol/L (ref 3.5–5.1)
Sodium: 133 mmol/L — ABNORMAL LOW (ref 135–145)
Total Bilirubin: 0.8 mg/dL (ref 0.0–1.2)
Total Protein: 7.5 g/dL (ref 6.5–8.1)

## 2024-01-02 LAB — CBC
HCT: 34.1 % — ABNORMAL LOW (ref 39.0–52.0)
Hemoglobin: 11.2 g/dL — ABNORMAL LOW (ref 13.0–17.0)
MCH: 30.3 pg (ref 26.0–34.0)
MCHC: 32.8 g/dL (ref 30.0–36.0)
MCV: 92.2 fL (ref 80.0–100.0)
Platelets: 129 K/uL — ABNORMAL LOW (ref 150–400)
RBC: 3.7 MIL/uL — ABNORMAL LOW (ref 4.22–5.81)
RDW: 14.9 % (ref 11.5–15.5)
WBC: 16.1 K/uL — ABNORMAL HIGH (ref 4.0–10.5)
nRBC: 0 % (ref 0.0–0.2)

## 2024-01-02 LAB — PHOSPHORUS: Phosphorus: 3.3 mg/dL (ref 2.5–4.6)

## 2024-01-02 LAB — LIPASE, BLOOD: Lipase: 19 U/L (ref 11–51)

## 2024-01-02 LAB — URINALYSIS W MICROSCOPIC + REFLEX CULTURE
Bacteria, UA: NONE SEEN /HPF
Bilirubin Urine: NEGATIVE
Glucose, UA: NEGATIVE
Leukocyte Esterase: NEGATIVE
Nitrites, Initial: NEGATIVE
Specific Gravity, Urine: 1.021 (ref 1.001–1.035)
WBC, UA: NONE SEEN /HPF (ref 0–5)
pH: 5.5 (ref 5.0–8.0)

## 2024-01-02 LAB — MAGNESIUM: Magnesium: 1.9 mg/dL (ref 1.7–2.4)

## 2024-01-02 LAB — NO CULTURE INDICATED

## 2024-01-02 LAB — I-STAT CG4 LACTIC ACID, ED
Lactic Acid, Venous: 0.8 mmol/L (ref 0.5–1.9)
Lactic Acid, Venous: 2.1 mmol/L (ref 0.5–1.9)

## 2024-01-02 LAB — BASIC METABOLIC PANEL WITH GFR
Anion gap: 10 (ref 5–15)
BUN: 21 mg/dL (ref 8–23)
CO2: 22 mmol/L (ref 22–32)
Calcium: 9.3 mg/dL (ref 8.9–10.3)
Chloride: 103 mmol/L (ref 98–111)
Creatinine, Ser: 1.65 mg/dL — ABNORMAL HIGH (ref 0.61–1.24)
GFR, Estimated: 42 mL/min — ABNORMAL LOW (ref >60)
Glucose, Bld: 101 mg/dL — ABNORMAL HIGH (ref 70–99)
Potassium: 3.8 mmol/L (ref 3.5–5.1)
Sodium: 135 mmol/L (ref 135–145)

## 2024-01-02 LAB — C DIFFICILE QUICK SCREEN W PCR REFLEX
C Diff antigen: POSITIVE — AB
C Diff toxin: NEGATIVE

## 2024-01-02 MED ORDER — ACETAMINOPHEN 650 MG RE SUPP: 650.0000 mg | Freq: Four times a day (QID) | RECTAL | Status: DC | PRN

## 2024-01-02 MED ORDER — ROSUVASTATIN CALCIUM 20 MG PO TABS
20.0000 mg | ORAL_TABLET | Freq: Every day | ORAL | Status: DC
  Administered 2024-01-02 – 2024-01-04 (×3): 20 mg via ORAL
  Filled 2024-01-02 (×3): qty 1

## 2024-01-02 MED ORDER — LACTATED RINGERS IV SOLN: INTRAVENOUS | Status: AC

## 2024-01-02 MED ORDER — AMLODIPINE BESYLATE 5 MG PO TABS: 5.0000 mg | ORAL_TABLET | Freq: Every day | ORAL | Status: DC

## 2024-01-02 MED ORDER — LACTATED RINGERS IV SOLN: 150.0000 mL/h | INTRAVENOUS | Status: DC

## 2024-01-02 MED ORDER — MELATONIN 5 MG PO TABS
5.0000 mg | ORAL_TABLET | Freq: Every evening | ORAL | Status: DC | PRN
Start: 2024-01-02 — End: 2024-01-05

## 2024-01-02 MED ORDER — ONDANSETRON HCL 4 MG/2ML IJ SOLN: 4.0000 mg | Freq: Four times a day (QID) | INTRAMUSCULAR | Status: DC | PRN

## 2024-01-02 MED ORDER — HEPARIN SODIUM (PORCINE) 5000 UNIT/ML IJ SOLN
5000.0000 [IU] | Freq: Three times a day (TID) | INTRAMUSCULAR | Status: DC
  Administered 2024-01-02 – 2024-01-05 (×9): 5000 [IU] via SUBCUTANEOUS
  Filled 2024-01-02 (×8): qty 1

## 2024-01-02 MED ORDER — AMLODIPINE BESYLATE 5 MG PO TABS
5.0000 mg | ORAL_TABLET | Freq: Two times a day (BID) | ORAL | Status: DC
  Administered 2024-01-02 – 2024-01-05 (×6): 5 mg via ORAL
  Filled 2024-01-02 (×6): qty 1

## 2024-01-02 MED ORDER — METRONIDAZOLE 500 MG/100ML IV SOLN
500.0000 mg | Freq: Two times a day (BID) | INTRAVENOUS | Status: DC
  Administered 2024-01-02 – 2024-01-03 (×2): 500 mg via INTRAVENOUS
  Filled 2024-01-02 (×2): qty 100

## 2024-01-02 MED ORDER — SODIUM CHLORIDE 0.9 % IV SOLN
2.0000 g | Freq: Two times a day (BID) | INTRAVENOUS | Status: DC
  Administered 2024-01-02 – 2024-01-03 (×2): 2 g via INTRAVENOUS
  Filled 2024-01-02 (×2): qty 12.5

## 2024-01-02 MED ORDER — VANCOMYCIN HCL 750 MG/150ML IV SOLN
750.0000 mg | INTRAVENOUS | Status: DC
  Administered 2024-01-03: 750 mg via INTRAVENOUS
  Filled 2024-01-02: qty 150

## 2024-01-02 MED ORDER — PIPERACILLIN-TAZOBACTAM 3.375 G IVPB 30 MIN: 3.3750 g | Freq: Once | INTRAVENOUS | Status: DC

## 2024-01-02 MED ORDER — ESCITALOPRAM OXALATE 20 MG PO TABS
20.0000 mg | ORAL_TABLET | Freq: Every day | ORAL | Status: DC
  Administered 2024-01-02 – 2024-01-04 (×3): 20 mg via ORAL
  Filled 2024-01-02 (×3): qty 1

## 2024-01-02 MED ORDER — SODIUM CHLORIDE 0.9 % IV SOLN
2.0000 g | Freq: Once | INTRAVENOUS | Status: AC
  Administered 2024-01-02: 2 g via INTRAVENOUS
  Filled 2024-01-02: qty 12.5

## 2024-01-02 MED ORDER — METRONIDAZOLE 500 MG/100ML IV SOLN
500.0000 mg | Freq: Once | INTRAVENOUS | Status: AC
  Administered 2024-01-02: 500 mg via INTRAVENOUS
  Filled 2024-01-02: qty 100

## 2024-01-02 MED ORDER — ACETAMINOPHEN 325 MG PO TABS
650.0000 mg | ORAL_TABLET | Freq: Four times a day (QID) | ORAL | Status: DC | PRN
  Administered 2024-01-04: 650 mg via ORAL
  Filled 2024-01-02: qty 2

## 2024-01-02 MED ORDER — INFLUENZA VAC SPLIT HIGH-DOSE 0.5 ML IM SUSY
0.5000 mL | PREFILLED_SYRINGE | INTRAMUSCULAR | Status: DC
  Filled 2024-01-02: qty 0.5

## 2024-01-02 MED ORDER — TAMSULOSIN HCL 0.4 MG PO CAPS
0.4000 mg | ORAL_CAPSULE | Freq: Every day | ORAL | Status: DC
  Administered 2024-01-02 – 2024-01-04 (×3): 0.4 mg via ORAL
  Filled 2024-01-02 (×3): qty 1

## 2024-01-02 MED ORDER — VANCOMYCIN HCL 1500 MG/300ML IV SOLN
1500.0000 mg | Freq: Once | INTRAVENOUS | Status: AC
  Administered 2024-01-02: 1500 mg via INTRAVENOUS
  Filled 2024-01-02: qty 300

## 2024-01-02 MED ORDER — IOHEXOL 300 MG/ML  SOLN
75.0000 mL | Freq: Once | INTRAMUSCULAR | Status: AC | PRN
  Administered 2024-01-02: 75 mL via INTRAVENOUS

## 2024-01-02 MED ORDER — DIPHENHYDRAMINE HCL 50 MG/ML IJ SOLN
25.0000 mg | Freq: Once | INTRAMUSCULAR | Status: DC
  Filled 2024-01-02: qty 1

## 2024-01-02 MED ORDER — CLOPIDOGREL BISULFATE 75 MG PO TABS
75.0000 mg | ORAL_TABLET | Freq: Every day | ORAL | Status: DC
  Administered 2024-01-04: 75 mg via ORAL
  Filled 2024-01-02 (×3): qty 1

## 2024-01-02 MED ORDER — ONDANSETRON HCL 4 MG PO TABS: 4.0000 mg | ORAL_TABLET | Freq: Four times a day (QID) | ORAL | Status: DC | PRN

## 2024-01-02 NOTE — H&P (Addendum)
 History and Physical    John Salazar FMW:[id number] DOB: 1945/03/31 DOA: 01/01/2024  DOS: the patient was seen and examined on 01/01/2024  PCP: Wendee Lynwood HERO, NP   Patient coming from: Home  I have personally briefly reviewed patient's old medical records in [facility] and CareEverywhere  HPI:  No notes on file   John Salazar is a 78 y.o. year old male with past medical history of hypertension, hyperlipidemia, GERD, carotid artery disease status post stenting, CAD, CKD 3, BPH, OSA, chronic diarrhea, depression, brain aneurysm, and anemia.  He presents to [facility] ED with reports of severe diarrhea with associated vomiting and has worsened significantly over the past 2 days.  Daughter at bedside reports his daughter at bedside reports symptoms began last Thursday and have progressively worsened.  Endorses abdominal pain, diarrhea, nausea, chills and confusion over the weekend.  Denies fever.  Of note patient has been on multiple antibiotics over the past month for pneumonia and after placement of Inspire device 12/01/23 for sleep apnea.  ED Course: On arrival to [facility] ED patient was noted to be afebrile temp 37.7 C, BP 110/60, HR 101, RR 17, SpO2 90% on room air.  CT chest abdomen pelvis obtained and shows diverticulosis without diverticulitis.  No signs of colitis or enteritis.  Labs notable for cytosis WBC 17.4, blood glucose 130, creatinine 1.96 with recent baseline 1.  4-1.6, acid 2.1-->0.8.  He was given cefepime , 1 L LR bolus, Flagyl , Zofran , and vancomycin  and started on continuous IV fluid. TRH contacted for admission.  Review of Systems: As mentioned in the history of present illness. All other systems reviewed and are negative.  Past Medical History:  Diagnosis Date   Anxiety    Ascending aorta dilation 08/22/2021   Echocardiogram 04/2021: 40 mm   Barrett esophagus    BPH (benign prostatic hyperplasia)    Cerebral aneurysm    Chronic kidney disease     stage 2   Colon polyps    Coronary artery disease    mild-mod non-obstructive CAD   Depression    Erectile dysfunction    GERD (gastroesophageal reflux disease)    History of kidney stones    History of stomach ulcers    Hypercholesteremia    Hypertension    Internal carotid artery stenosis, right 06/29/2020   Positive TB test    in the past   Sleep apnea    does not use CPAP    Past Surgical History:  Procedure Laterality Date   APPENDECTOMY  1970   CATARACT EXTRACTION W/ INTRAOCULAR LENS  IMPLANT, BILATERAL     COLONOSCOPY  10/2012   DRUG INDUCED ENDOSCOPY N/A 10/30/2023   Procedure: DRUG INDUCED SLEEP ENDOSCOPY;  Surgeon: Okey Burns, MD;  Location: [facility];  Service: ENT;  Laterality: N/A;   ESOPHAGOGASTRODUODENOSCOPY ENDOSCOPY  10/2012   HERNIA REPAIR     IMPLANTATION OF HYPOGLOSSAL NERVE STIMULATOR N/A 12/06/2023   Procedure: INSERTION, HYPOGLOSSAL NERVE STIMULATOR;  Surgeon: Anice Riis, DO;  Location: [facility] OR;  Service: ENT;  Laterality: N/A;  Inspire V   IR 3D INDEPENDENT WKST  04/14/2020   IR ANGIO INTRA EXTRACRAN SEL COM CAROTID INNOMINATE BILAT MOD SED  08/09/2017   IR ANGIO INTRA EXTRACRAN SEL COM CAROTID INNOMINATE BILAT MOD SED  11/08/2018   IR ANGIO INTRA EXTRACRAN SEL COM CAROTID INNOMINATE BILAT MOD SED  04/14/2020   IR ANGIO INTRA EXTRACRAN SEL COM CAROTID INNOMINATE UNI L MOD SED  06/29/2020   IR ANGIO INTRA EXTRACRAN SEL INTERNAL CAROTID UNI L MOD SED  05/20/2020   IR ANGIO VERTEBRAL SEL VERTEBRAL BILAT MOD SED  08/09/2017   IR ANGIO VERTEBRAL SEL VERTEBRAL BILAT MOD SED  11/08/2018   IR ANGIO VERTEBRAL SEL VERTEBRAL BILAT MOD SED  04/14/2020   IR ANGIOGRAM FOLLOW UP STUDY  05/20/2020   IR INTRAVSC STENT CERV CAROTID W/EMB-PROT MOD SED INCL ANGIO  08/23/2017   IR INTRAVSC STENT CERV CAROTID W/EMB-PROT MOD SED INCL ANGIO  06/29/2020   IR RADIOLOGIST EVAL & MGMT  05/05/2020   IR RADIOLOGIST EVAL & MGMT  06/03/2020   IR RADIOLOGIST EVAL & MGMT  07/15/2020    IR TRANSCATH/EMBOLIZ  05/18/2020   IR US  GUIDE VASC ACCESS RIGHT  04/14/2020   IR US  GUIDE VASC ACCESS RIGHT  05/20/2020   IR US  GUIDE VASC ACCESS RIGHT  06/29/2020   LEFT HEART CATH AND CORONARY ANGIOGRAPHY N/A 11/15/2018   Procedure: LEFT HEART CATH AND CORONARY ANGIOGRAPHY;  Surgeon: Mady Bruckner, MD;  Location: [facility];  Service: Cardiovascular;  Laterality: N/A;   PROSTATE SURGERY  1999   20 years ago, enlarged prostate   RADIOLOGY WITH ANESTHESIA N/A 08/23/2017   Procedure: IR WITH ANESTHESIA STENT PLACEMENT;  Surgeon: Dolphus Carrion, MD;  Location: [facility] OR;  Service: Radiology;  Laterality: N/A;   RADIOLOGY WITH ANESTHESIA N/A 05/18/2020   Procedure: IR WITH ANESTHESIA EMBOLIZATION;  Surgeon: Dolphus Carrion, MD;  Location: [facility] OR;  Service: Radiology;  Laterality: N/A;   RADIOLOGY WITH ANESTHESIA Left 06/29/2020   Procedure: RADIOLOGY WITH ANESTHESIA  LEFT CAROTID STENT PLACEMENT;  Surgeon: Dolphus Carrion, MD;  Location: [facility] OR;  Service: Radiology;  Laterality: Left;     reports that he quit smoking about 27 years ago. His smoking use included cigarettes. He started smoking about 52 years ago. He has a 50 pack-year smoking history. He has never used smokeless tobacco. He reports that he does not drink alcohol  and does not use drugs.  Allergies  Allergen Reactions   Bee Venom Anaphylaxis and Shortness Of Breath   Penicillins Swelling and Rash    As a child     Family History  Problem Relation Age of Onset   Stroke Mother 76   Aneurysm Father 64   Colon cancer Neg Hx    Esophageal cancer Neg Hx     Prior to Admission medications   Medication Sig Start Date End Date Taking? Authorizing Provider  amLODipine  (NORVASC ) 5 MG tablet Take 1 tablet (5 mg total) by mouth 2 (two) times daily. 11/21/23   Okey Vina GAILS, MD  aspirin  EC 81 MG tablet Take 1 tablet (81 mg total) by mouth daily. Swallow whole. 12/21/23   Okey Vina GAILS, MD  azithromycin  (ZITHROMAX ) 250 MG  tablet Take 2 tablets on day 1, then 1 tablet daily on days 2 through 5 01/01/24 01/06/24  Wendee Lynwood HERO, NP  cefpodoxime  (VANTIN ) 200 MG tablet Take 1 tablet (200 mg total) by mouth 2 (two) times daily. 01/01/24   Wendee Lynwood HERO, NP  cetirizine  (ZYRTEC ) 10 MG tablet Take 0.5 tablets (5 mg total) by mouth at bedtime. 12/19/23   Anice Riis, DO  escitalopram  (LEXAPRO ) 20 MG tablet Take 1 tablet (20 mg total) by mouth daily. Patient taking differently: Take 20 mg by mouth at bedtime. 03/23/23   Dugal, Tabitha, FNP  famotidine  (PEPCID ) 20 MG tablet Take 20 mg by mouth daily as needed for heartburn or indigestion.  [provider]  isosorbide  mononitrate (IMDUR ) 30 MG 24 hr tablet Take 0.5 tablets (15 mg total) by mouth daily. AS NEEDED Patient taking differently: Take 15 mg by mouth daily as needed (for fatigue, to increase blood flow.). 02/14/23   Okey Vina GAILS, MD  methylPREDNISolone  (MEDROL  DOSEPAK) 4 MG TBPK tablet Take as directed 12/19/23   Anice Riis, DO  nitroGLYCERIN  (NITROSTAT ) 0.4 MG SL tablet Place 1 tablet (0.4 mg total) under the tongue every 5 (five) minutes as needed for chest pain. 02/14/23   Okey Vina GAILS, MD  rosuvastatin  (CRESTOR ) 40 MG tablet Take one (1) tablet by mouth (40 mg) daily. 12/20/23   Okey Vina GAILS, MD  tamsulosin  (FLOMAX ) 0.4 MG CAPS capsule Take one capsule once daily ( thirty minutes after same meal daily) Patient taking differently: Take one capsule once daily ( thirty minutes after same meal daily) nightly 11/28/22   Okey Vina GAILS, MD  Vonoprazan Fumarate  (VOQUEZNA ) 10 MG TABS Take 10 mg by mouth daily. 12/13/23 03/12/24  Mollie Nestor HERO, PA-C    Physical Exam: Vitals:   01/02/24 0249 01/02/24 0259 01/02/24 0630 01/02/24 0700  BP:   109/68 106/62  Pulse:   77 75  Resp:   16 18  Temp: 99 F (37.2 C) 98.7 F (37.1 C)    TempSrc: Axillary Rectal    SpO2:   92% 96%    Physical Exam Vitals reviewed.  Constitutional:      Appearance: He  is normal weight.  HENT:     Head: Normocephalic.  Eyes:     Pupils: Pupils are equal, round, and reactive to light.  Cardiovascular:     Rate and Rhythm: Normal rate and regular rhythm.     Pulses: Normal pulses.     Heart sounds: Normal heart sounds. No murmur heard.    No friction rub. No gallop.  Pulmonary:     Effort: Pulmonary effort is normal. No respiratory distress.     Breath sounds: Normal breath sounds.  Abdominal:     General: There is no distension.     Palpations: Abdomen is soft.     Tenderness: There is abdominal tenderness.  Skin:    General: Skin is warm and dry.     Capillary Refill: Capillary refill takes less than 2 seconds.  Neurological:     General: No focal deficit present.     Mental Status: He is alert and oriented to person, place, and time.  Psychiatric:        Mood and Affect: Mood normal.        Behavior: Behavior normal.     Labs on Admission: I have personally reviewed following labs and imaging studies  CBC: Recent Labs  Lab 01/01/24 1250 01/01/24 2356  WBC 17.2* 17.4*  NEUTROABS 14.9* 15.9*  HGB 12.6* 13.5  HCT 37.6* 40.9  MCV 88.9 91.7  PLT 159.0 158   Basic Metabolic Panel: Recent Labs  Lab 01/01/24 1250 01/01/24 2356  NA 133* 133*  K 4.0 3.6  CL 101 98  CO2 26 21*  GLUCOSE 120* 130*  BUN 20 23  CREATININE 1.66* 1.96*  CALCIUM  9.2 10.1   GFR: Estimated Creatinine Clearance: 30.6 mL/min (A) (by C-G formula based on SCr of 1.96 mg/dL (H)). Liver Function Tests: Recent Labs  Lab 01/01/24 1250 01/01/24 2356  AST 17 21  ALT 22 25  ALKPHOS 72 91  BILITOT 0.7 0.8  PROT 6.8 7.5  ALBUMIN  4.0 4.1   Recent  Labs  Lab 01/01/24 1250 01/01/24 2356  LIPASE 10.0* 19   Recent Labs  Lab 01/01/24 1250  AMMONIA 26   Coagulation Profile: No results for input(s): INR, PROTIME in the last 168 hours. Cardiac Enzymes: No results for input(s): CKTOTAL, CKMB, CKMBINDEX, TROPONINI, TROPONINIHS in the last 168  hours. BNP (last 3 results) No results for input(s): BNP in the last 8760 hours. HbA1C: No results for input(s): HGBA1C in the last 72 hours. CBG: No results for input(s): GLUCAP in the last 168 hours. Lipid Profile: No results for input(s): CHOL, HDL, LDLCALC, TRIG, CHOLHDL, LDLDIRECT in the last 72 hours. Thyroid  Function Tests: No results for input(s): TSH, T4TOTAL, FREET4, T3FREE, THYROIDAB in the last 72 hours. Anemia Panel: No results for input(s): VITAMINB12, FOLATE, FERRITIN, TIBC, IRON, RETICCTPCT in the last 72 hours. Urine analysis:    Component Value Date/Time   COLORURINE YELLOW 01/01/2024 1250   APPEARANCEUR CLEAR 01/01/2024 1250   LABSPEC 1.021 01/01/2024 1250   PHURINE 5.5 01/01/2024 1250   GLUCOSEU NEGATIVE 01/01/2024 1250   HGBUR TRACE (A) 01/01/2024 1250   BILIRUBINUR Negative 10/26/2021 0835   KETONESUR TRACE (A) 01/01/2024 1250   PROTEINUR 1+ (A) 01/01/2024 1250   UROBILINOGEN 0.2 10/26/2021 0835   NITRITE Negative 10/26/2021 0835   LEUKOCYTESUR Negative 10/26/2021 0835    Radiological Exams on Admission: I have personally reviewed images CT CHEST ABDOMEN PELVIS W CONTRAST Result Date: 01/02/2024 EXAM: CT CHEST, ABDOMEN AND PELVIS WITH CONTRAST 01/02/2024 02:38:39 AM TECHNIQUE: CT of the chest, abdomen and pelvis was performed with the administration of 75 mL of iohexol  (OMNIPAQUE ) 300 MG/ML solution. Multiplanar reformatted images are provided for review. Automated exposure control, iterative reconstruction, and/or weight based adjustment of the mA/kV was utilized to reduce the radiation dose to as low as reasonably achievable. COMPARISON: 05/04/2020 CLINICAL HISTORY: Abdominal pain and nausea and vomiting for 2 days. FINDINGS: CHEST: MEDIASTINUM AND LYMPH NODES: Heart and pericardium are unremarkable. No cardiac enlargement is noted. Coronary calcifications are seen. The central airways are clear. No large central  pulmonary embolus is noted. Thoracic inlet is within normal limits. No hilar or mediastinal adenopathy is noted. The esophagus is within normal limits. LUNGS AND PLEURA: Lungs are well aerated bilaterally. No focal infiltrate Or sizable effusion is noted. Diffuse emphysematous changes are seen. No sizable parenchymal nodule is noted. No pneumothorax. ABDOMEN AND PELVIS: LIVER: The liver is within normal limits, with the exception of a few small left lobe cysts stable from the prior exam. GALLBLADDER AND BILE DUCTS: The gallbladder is within normal limits. No biliary ductal dilatation. SPLEEN: The spleen is within normal limits. PANCREAS: The pancreas is within normal limits. ADRENAL GLANDS: Adrenal glands are unremarkable. KIDNEYS, URETERS AND BLADDER: Bilateral renal cysts are seen and stable. No follow-up is recommended. No renal calculi or obstructive changes are seen. No hydronephrosis. The bladder is well distended. GI AND BOWEL: Stomach and small bowel are within normal limits. No obstructive or inflammatory changes of the colon are noted. Diverticular changes seen without evidence of diverticulitis. The appendix is not visualized, consistent with the prior surgical history. There is no bowel obstruction. REPRODUCTIVE ORGANS: The prostate is within normal limits. PERITONEUM AND RETROPERITONEUM: No ascites. No free air. VASCULATURE: Atherosclerotic calcifications of the thoracic aorta and its branches are seen. Abdominal Aortic calcifications are noted. ABDOMINAL AND PELVIS LYMPH NODES: No lymphadenopathy. BONES AND SOFT TISSUES: Degenerative changes of the thoracic spine are seen. IMPRESSION: 1. Diverticulosis without diverticulitis. 2. No acute abnormality noted. Electronically  signed by: Oneil Devonshire MD 01/02/2024 02:55 AM EST RP Workstation: [id number]   DG Chest 2 View Result Date: 01/01/2024 CLINICAL DATA:  Cough, fever, and rhonchi EXAM: CHEST - 2 VIEW COMPARISON:  Chest radiograph dated 12/06/2023  FINDINGS: Normal lung volumes. Hazy opacity projecting over the right lower lung. No pleural effusion or pneumothorax. The heart size and mediastinal contours are within normal limits. No acute osseous abnormality. Nerve stimulator generator projects over the anteromedial right chest with lead terminating above the field of view. IMPRESSION: Hazy opacity projecting over the right lower lung, which may represent atelectasis or pneumonia. Electronically Signed   By: Limin  Xu M.D.   On: 01/01/2024 13:52     Assessment/Plan Principal Problem:   Sepsis (HCC)   #Sepsis Source unknown at this time - Broad-spectrum coverage with cefepime , metronidazole , and vancomycin  - IV Fluid hydration - Follow Blood cultures - Follow Fever curve  #Diarrhea Has history of chronic diarrhea however reports at present he is going to the bathroom multiple times per hour which is abnormal for him - IV fluid hydration - Check C. difficile and GI pathogen panel  #Acute kidney injury on chronic kidney disease stage IIIb Secondary to GI loss. Creatinine initially 1.96 improved to 1.6 this morning which is in line with his recent baseline of 1.5-1.65 - Continue IV fluid as above - Avoid for toxins, contrast Dyes, Hypotension and Dehydration  - Continue to Monitor and Trend Renal Function, repeat metabolic panel in the AM    #Hypertension -Continue home amlodipine    #Hyperlipidemia -Continue home Crestor   #GERD -Protonix   #CAD -Continue home Plavix   #BPH -Home Flomax   #OSA CPAP intolerant -  PRN qHS O2 while sleeping  #Depression - Continue home Lexapro    VTE prophylaxis:  Lovenox  GI prophylaxis: Not indicated  Diet: Heart healthy Access: PIV Lines: None Code Status:  DNR/DNI(Do NOT Intubate), discussed with patient at bedside who confirms DNR/DNI Telemetry: Yes  Admission status: Inpatient, Progressive Patient is from: Home  Anticipated d/c is to: Home  Anticipated d/c date is: 2-3  days Patient currently: Receiving IV antibiotics and IV fluid hydration  Family Communication: Daughter at bedside   Consults called: N/A    Severity of Illness: The appropriate patient status for this patient is INPATIENT. Inpatient status is judged to be reasonable and necessary in order to provide the required intensity of service to ensure the patient's safety. The patient's presenting symptoms, physical exam findings, and initial radiographic and laboratory data in the context of their chronic comorbidities is felt to place them at high risk for further clinical deterioration. Furthermore, it is not anticipated that the patient will be medically stable for discharge from the hospital within 2 midnights of admission.   * I certify that at the point of admission it is my clinical judgment that the patient will require inpatient hospital care spanning beyond 2 midnights from the point of admission due to high intensity of service, high risk for further deterioration and high frequency of surveillance required.*  To reach the provider On-Call:   7AM- 7PM see care teams to locate the attending and reach out to them via [email or web address]. Password: [email or web address] 7PM-7AM contact night-coverage If you still have difficulty reaching the appropriate provider, please page the [facility] (Director on Call) for [facility] on amion for assistance  This document was prepared using Conservation officer, historic buildings and may include unintentional dictation errors.  John Rams FNP-BC, PMHNP-BC Nurse Practitioner [facility]

## 2024-01-02 NOTE — Progress Notes (Signed)
 Carryover: admit for sepsis no overt source

## 2024-01-02 NOTE — Plan of Care (Signed)

## 2024-01-02 NOTE — Progress Notes (Signed)
 Pharmacy Antibiotic Note  John Salazar is a 78 y.o. male admitted on 01/01/2024 with vomiting and diarrhea. Pharmacy has been consulted for vancomycin  and cefepime  dosing.  Plan: -Vancomycin  1500 mg IV x 1 given, follow with 750 mg IV q24h -Cefepime  2 g IV q12h  Height: 5' 9 (175.3 cm) Weight: 76.4 kg (168 lb 6.9 oz) IBW/kg (Calculated) : 70.7  Temp (24hrs), Avg:98.8 F (37.1 C), Min:97.9 F (36.6 C), Max:99.9 F (37.7 C)  Recent Labs  Lab 01/01/24 1250 01/01/24 2356 01/02/24 0009 01/02/24 0255  WBC 17.2* 17.4*  --   --   CREATININE 1.66* 1.96*  --   --   LATICACIDVEN  --   --  2.1* 0.8    Estimated Creatinine Clearance: 30.6 mL/min (A) (by C-G formula based on SCr of 1.96 mg/dL (H)).    Allergies  Allergen Reactions   Bee Venom Anaphylaxis and Shortness Of Breath   Penicillins Swelling and Rash    As a child     Antimicrobials this admission: Cefepime  11/25 >> Metronidazole  11/25 >> Vancomycin  11/15 >>  Dose adjustments this admission: NA  Microbiology results: 11/25 BCx: ngtd   Thank you for allowing pharmacy to be a part of this patient's care.  Stefano MARLA Bologna, PharmD, BCPS Clinical Pharmacist 01/02/2024 9:16 AM

## 2024-01-02 NOTE — ED Notes (Signed)
 Patient transported to CT

## 2024-01-02 NOTE — TOC Initial Note (Signed)
 Transition of Care [facility]) - Initial/Assessment Note    Patient Details  Name: John Salazar MRN: [id number] Date of Birth: 1945-09-22  Transition of Care [facility] CM/SW Contact:    Bascom Service, RN Phone Number: 01/02/2024, 11:06 AM  Clinical Narrative: Spoke to Pam(spouse) d/c plan home. Has own transport home.                  Expected Discharge Plan: Home/Self Care Barriers to Discharge: Continued Medical Work up   Patient Goals and CMS Choice Patient states their goals for this hospitalization and ongoing recovery are:: Home CMS Medicare.gov Compare Post Acute Care list provided to:: Patient Represenative (must comment) (Pam(spouse)) Choice offered to / list presented to : Spouse Gervais ownership interest in [facility].provided to:: Spouse    Expected Discharge Plan and Services   Discharge Planning Services: CM Consult   Living arrangements for the past 2 months: Single Family Home                                      Prior Living Arrangements/Services Living arrangements for the past 2 months: Single Family Home Lives with:: Spouse                   Activities of Daily Living   ADL Screening (condition at time of admission) Independently performs ADLs?: Yes (appropriate for developmental age) Is the patient deaf or have difficulty hearing?: No Does the patient have difficulty seeing, even when wearing glasses/contacts?: No Does the patient have difficulty concentrating, remembering, or making decisions?: No  Permission Sought/Granted                  Emotional Assessment              Admission diagnosis:  Dehydration [E86.0] Colitis [K52.9] Sepsis (HCC) [A41.9] Patient Active Problem List   Diagnosis Date Noted   Sepsis (HCC) 01/02/2024   Intolerance of continuous positive airway pressure (CPAP) ventilation 10/30/2023   Former tobacco use 08/25/2023   TIA (transient ischemic attack) 08/14/2023   Acute cough  06/27/2023   Bronchitis 06/27/2023   Sudden idiopathic hearing loss 06/27/2023   Short-term memory loss 03/15/2023   Gastroesophageal reflux disease with esophagitis without hemorrhage 03/15/2023   Gastropathy 03/15/2023   Trigger index finger of left hand 03/15/2023   OSA (obstructive sleep apnea) 11/14/2022   Depression, major, single episode, moderate (HCC) 06/10/2022   Hx of long term use of blood thinners 06/10/2022   Vitamin B12 deficiency 05/23/2022   Preventative health care 05/23/2022   Coronary artery disease involving native coronary artery of native heart with angina pectoris 08/22/2021   Carotid artery disease 08/22/2021   Ascending aorta dilation 08/22/2021   Incomplete RBBB 08/05/2021   Chronic diarrhea 11/23/2020   Brain aneurysm 05/18/2020   Snoring 02/27/2019   Accelerating angina (HCC) 11/15/2018   DNR (do not resuscitate) 08/23/2018   Hx of cataract surgery 08/23/2018   History of right common carotid artery stent placement 06/18/2018   Prediabetes 03/19/2018   Pain in right knee 03/05/2018   CKD (chronic kidney disease) stage 3, GFR 30-59 ml/min (HCC) 02/05/2018   Barrett esophagus 01/08/2018   Benign essential HTN 01/08/2018   BPH (benign prostatic hyperplasia) 01/08/2018   Hyperlipidemia LDL goal <70 01/08/2018   Anemia 01/08/2018   Imbalance 06/26/2017   PCP:  Wendee Lynwood HERO, NP Pharmacy:   [facility]  [address] [facility] - [place], [place] - [address] [facility] [address] [place] [address] Phone: [phone] Fax: [phone]  [facility] [place], [place] - [address] [place] [address] Phone: [phone] Fax: [phone]     Social Drivers of Health (SDOH) Social History: SDOH Screenings   Food Insecurity: No Food Insecurity (01/02/2024)  Housing: Low Risk  (01/02/2024)  Transportation Needs: No Transportation Needs (01/02/2024)  Utilities: Not At Risk (01/02/2024)  Alcohol   Screen: Low Risk  (05/18/2023)  Depression (PHQ2-9): Low Risk  (01/01/2024)  Recent Concern: Depression (PHQ2-9) - High Risk (10/23/2023)  Financial Resource Strain: Low Risk  (08/22/2023)  Physical Activity: Insufficiently Active (08/22/2023)  Social Connections: Socially Integrated (01/02/2024)  Stress: No Stress Concern Present (08/22/2023)  Tobacco Use: Medium Risk (01/02/2024)  Health Literacy: Adequate Health Literacy (05/18/2023)   SDOH Interventions:     Readmission Risk Interventions     No data to display

## 2024-01-02 NOTE — ED Notes (Signed)
 Patient is resting comfortably.

## 2024-01-02 NOTE — ED Notes (Signed)
 [facility] [employer] RN sitting behind desk and heard pt assisted fall by the NTs.  RN assisted team getting pt into stretcher. Pt insisted on still going to the restroom.  RN used a sara steady with pt after pt refused bed pan.  RN performed post fall assessment at this time.

## 2024-01-02 NOTE — Telephone Encounter (Signed)
 Provider spoke with patient and wife about labs.

## 2024-01-02 NOTE — Telephone Encounter (Signed)
 Copied from CRM #[id number]. Topic: Clinical - Lab/Test Results >> Jan 01, 2024  5:42 PM Alfonso HERO wrote: Reason for CRM: patient wife calling for lab results taken this morning

## 2024-01-03 ENCOUNTER — Other Ambulatory Visit (HOSPITAL_COMMUNITY): Payer: Self-pay

## 2024-01-03 ENCOUNTER — Telehealth (HOSPITAL_COMMUNITY): Payer: Self-pay

## 2024-01-03 DIAGNOSIS — R6520 Severe sepsis without septic shock: Secondary | ICD-10-CM | POA: Diagnosis not present

## 2024-01-03 DIAGNOSIS — A419 Sepsis, unspecified organism: Secondary | ICD-10-CM | POA: Diagnosis not present

## 2024-01-03 DIAGNOSIS — J9600 Acute respiratory failure, unspecified whether with hypoxia or hypercapnia: Secondary | ICD-10-CM | POA: Diagnosis not present

## 2024-01-03 LAB — GASTROINTESTINAL PANEL BY PCR, STOOL (REPLACES STOOL CULTURE)

## 2024-01-03 LAB — BASIC METABOLIC PANEL WITH GFR
Anion gap: 8 (ref 5–15)
BUN: 19 mg/dL (ref 8–23)
CO2: 23 mmol/L (ref 22–32)
Calcium: 8.8 mg/dL — ABNORMAL LOW (ref 8.9–10.3)
Chloride: 109 mmol/L (ref 98–111)
Creatinine, Ser: 1.52 mg/dL — ABNORMAL HIGH (ref 0.61–1.24)
GFR, Estimated: 46 mL/min — ABNORMAL LOW (ref >60)
Glucose, Bld: 101 mg/dL — ABNORMAL HIGH (ref 70–99)
Potassium: 3.5 mmol/L (ref 3.5–5.1)
Sodium: 139 mmol/L (ref 135–145)

## 2024-01-03 LAB — CBC
HCT: 32.6 % — ABNORMAL LOW (ref 39.0–52.0)
Hemoglobin: 10.6 g/dL — ABNORMAL LOW (ref 13.0–17.0)
MCH: 29.7 pg (ref 26.0–34.0)
MCHC: 32.5 g/dL (ref 30.0–36.0)
MCV: 91.3 fL (ref 80.0–100.0)
Platelets: 129 K/uL — ABNORMAL LOW (ref 150–400)
RBC: 3.57 MIL/uL — ABNORMAL LOW (ref 4.22–5.81)
RDW: 14.7 % (ref 11.5–15.5)
WBC: 10.2 K/uL (ref 4.0–10.5)
nRBC: 0 % (ref 0.0–0.2)

## 2024-01-03 LAB — CLOSTRIDIUM DIFFICILE BY PCR, REFLEXED
Hypervirulent Strain: NEGATIVE
Toxigenic C. Difficile by PCR: POSITIVE — AB

## 2024-01-03 LAB — PROTIME-INR
INR: 1.1 (ref 0.8–1.2)
Prothrombin Time: 15.3 s — ABNORMAL HIGH (ref 11.4–15.2)

## 2024-01-03 LAB — CORTISOL-AM, BLOOD: Cortisol - AM: 13.8 ug/dL (ref 6.7–22.6)

## 2024-01-03 MED ORDER — VANCOMYCIN HCL 125 MG PO CAPS
125.0000 mg | ORAL_CAPSULE | Freq: Four times a day (QID) | ORAL | Status: DC
  Administered 2024-01-03 – 2024-01-05 (×8): 125 mg via ORAL
  Filled 2024-01-03 (×10): qty 1

## 2024-01-03 NOTE — Telephone Encounter (Signed)
 Pharmacy Patient Advocate Encounter  Insurance verification completed.    The patient is insured through [employer]. Patient has Medicare and is not eligible for a copay card, but may be able to apply for patient assistance or Medicare RX Payment Plan (Patient Must reach out to their plan, if eligible for payment plan), if available.    Ran test claim for Generic Dificid 200mg  and the current 30 day co-pay is $1,468.49.  Ran test claim for Vancomycin  125mg  and the current 30 day co-pay is $22.90.   This test claim was processed through [facility]- copay amounts may vary at other pharmacies due to pharmacy/plan contracts, or as the patient moves through the different stages of their insurance plan.

## 2024-01-03 NOTE — Progress Notes (Signed)
 PROGRESS NOTE  John Salazar  FMW:[id number] DOB: [birth year] DOA: 01/01/2024 PCP: Wendee Lynwood HERO, NP   Brief Narrative:  Patient is a 78 year old male with history of hypertension, hyperlipidemia, GERD, peripheral vascular disease, coronary artery disease, CKD stage IIIb, BPH, OSA, depression, brain aneurysm who was brought to the emergency department because of persistent diarrhea, fever, vomiting, abdominal discomfort. Symptoms started on last Thursday. Patient was also reportedly confused. Patient has been on antibiotics over the last few months for placement of inspire device for sleep apnea and also for pneumonia. On presentation, he was hemodynamically stable, afebrile. CT chest/abdomen/pelvis showed diverticulosis without any signs of diverticulitis. Lab work showed elevated leukocytes, creatinine of 1.9(baseline creatinine of 1.4-1.6), lactic acid was mild elevated 2.1. He was started on broad spectrum antibiotics.  C. difficile antigen came out a positive.  Started on oral vancomycin .  Assessment & Plan:  Principal Problem:   Sepsis (HCC) Active Problems:   Hyperlipidemia LDL goal <70   Diarrhea   Coronary artery disease involving native coronary artery of native heart with angina pectoris   Carotid artery disease   OSA (obstructive sleep apnea)   Gastroesophageal reflux disease with esophagitis without hemorrhage   Acute kidney injury superimposed on chronic kidney disease   Depression   Sepsis secondary to C diff : Presented with fever, leukocytosis, mild elevated lactate, abdominal pain.  Initially started on bowel spectrum antibiotics. intra-abdominal source suspected but CT imaging does not show any acute findings.  C. difficile antigen came out to be positive.  Though toxin is negative.  He clearly has symptoms of C. difficile so started on oral vancomycin .  Other antibiotics will be discontinued.  Follow-up cultures  AKI on CKD stage IIIb: Prerenal AKI due to volume loss  from diarrhea.  Baseline creatinine of 1.5-1.6.  Currently kidney function close to baseline.  Continue IV fluid for today   Hypertension: Continue amlodipine .  Monitor blood pressure   Hyperlipidemia: Continue Crestor    GERD: Takes PPI at home.  Will hold on the background of C. difficile   History of coronary artery disease/peripheral vascular disease: On Plavix .  No anginal symptoms    BPH:On  Flomax    OSA: Intolerant of CPAP.  Recently had inspire device placement   Depression: On Lexapro         DVT prophylaxis:heparin  injection 5,000 Units Start: 01/02/24 1400     Code Status: Limited: Do not attempt resuscitation (DNR) -DNR-LIMITED -Do Not Intubate/DNI   Family Communication: Wife at bedside on 11/26  Patient status: Inpatient  Patient is from : Home  Anticipated discharge to: Home  Estimated DC date: Improvement of the diarrhea   Consultants: None  Procedures: None  Antimicrobials:  Anti-infectives (From admission, onward)    Start     Dose/Rate Route Frequency Ordered Stop   01/03/24 1000  vancomycin  (VANCOCIN ) capsule 125 mg        125 mg Oral 4 times daily 01/03/24 0838 01/13/24 0959   01/03/24 0500  vancomycin  (VANCOREADY) IVPB 750 mg/150 mL  Status:  Discontinued        750 mg 150 mL/hr over 60 Minutes Intravenous Every 24 hours 01/02/24 0913 01/03/24 0809   01/02/24 1500  metroNIDAZOLE  (FLAGYL ) IVPB 500 mg  Status:  Discontinued        500 mg 100 mL/hr over 60 Minutes Intravenous Every 12 hours 01/02/24 0809 01/03/24 0809   01/02/24 1400  ceFEPIme  (MAXIPIME ) 2 g in sodium chloride  0.9 % 100 mL IVPB  Status:  Discontinued        2 g 200 mL/hr over 30 Minutes Intravenous Every 12 hours 01/02/24 0913 01/03/24 0809   01/02/24 0215  ceFEPIme  (MAXIPIME ) 2 g in sodium chloride  0.9 % 100 mL IVPB        2 g 200 mL/hr over 30 Minutes Intravenous  Once 01/02/24 0204 01/02/24 0357   01/02/24 0215  metroNIDAZOLE  (FLAGYL ) IVPB 500 mg        500 mg 100 mL/hr  over 60 Minutes Intravenous  Once 01/02/24 0204 01/02/24 0405   01/02/24 0200  vancomycin  (VANCOREADY) IVPB 1500 mg/300 mL        1,500 mg 150 mL/hr over 120 Minutes Intravenous  Once 01/02/24 0158 01/02/24 0600   01/02/24 0200  piperacillin -tazobactam (ZOSYN ) IVPB 3.375 g  Status:  Discontinued        3.375 g 100 mL/hr over 30 Minutes Intravenous  Once 01/02/24 0158 01/02/24 0204       Subjective: Patient seen and examined at bedside today.  Hemodynamically stable.  Appears more comfortable today.  Still having loose bowel movements but no abdominal pain, nausea or vomiting.  No fever today.   Objective: Vitals:   01/02/24 0805 01/02/24 1437 01/02/24 1953 01/03/24 0410  BP:  112/69 (!) 142/73 104/68  Pulse:  82 87 78  Resp:  18 17 14   Temp:  98.8 F (37.1 C) 98.3 F (36.8 C) 97.9 F (36.6 C)  TempSrc:  Oral Oral   SpO2:  95% 98% 95%  Weight: 76.4 kg     Height: 5' 9 (1.753 m)       Intake/Output Summary (Last 24 hours) at 01/03/2024 1151 Last data filed at 01/03/2024 1042 Gross per 24 hour  Intake 1308.42 ml  Output --  Net 1308.42 ml   Filed Weights   01/02/24 0805  Weight: 76.4 kg    Examination:  General exam: Overall comfortable, not in distress HEENT: PERRL Respiratory system:  no wheezes or crackles  Cardiovascular system: S1 & S2 heard, RRR.  Gastrointestinal system: Abdomen is nondistended, soft and nontender. Central nervous system: Alert and oriented Extremities: No edema, no clubbing ,no cyanosis Skin: No rashes, no ulcers,no icterus     Data Reviewed: I have personally reviewed following labs and imaging studies  CBC: Recent Labs  Lab 01/01/24 1250 01/01/24 2356 01/02/24 0820 01/03/24 0517  WBC 17.2* 17.4* 16.1* 10.2  NEUTROABS 14.9* 15.9*  --   --   HGB 12.6* 13.5 11.2* 10.6*  HCT 37.6* 40.9 34.1* 32.6*  MCV 88.9 91.7 92.2 91.3  PLT 159.0 158 129* 129*   Basic Metabolic Panel: Recent Labs  Lab 01/01/24 1250 01/01/24 2356  01/02/24 0820 01/03/24 0517  NA 133* 133* 135 139  K 4.0 3.6 3.8 3.5  CL 101 98 103 109  CO2 26 21* 22 23  GLUCOSE 120* 130* 101* 101*  BUN 20 23 21 19   CREATININE 1.66* 1.96* 1.65* 1.52*  CALCIUM  9.2 10.1 9.3 8.8*  MG  --   --  1.9  --   PHOS  --   --  3.3  --      Recent Results (from the past 240 hours)  C Difficile Quick Screen w PCR reflex     Status: Abnormal   Collection Time: 01/01/24  5:05 PM   Specimen: STOOL  Result Value Ref Range Status   C Diff antigen POSITIVE (A) NEGATIVE Final   C Diff toxin NEGATIVE NEGATIVE Final   C Diff interpretation  Results are indeterminate. See PCR results.  Final    Comment: Performed at [facility], [address], [place], [place] [address]  Blood culture (routine x 2)     Status: None (Preliminary result)   Collection Time: 01/01/24 11:56 PM   Specimen: BLOOD  Result Value Ref Range Status   Specimen Description   Final    BLOOD SITE NOT SPECIFIED Performed at [facility], [address], [place], [place] [address]    Special Requests   Final    BOTTLES DRAWN AEROBIC AND ANAEROBIC Blood Culture adequate volume Performed at [facility], [address] [place], [place] [address]    Culture   Final    NO GROWTH 1 DAY Performed at [facility], [address] [place], [place] [address]    Report Status PENDING  Incomplete  Blood culture (routine x 2)     Status: None (Preliminary result)   Collection Time: 01/02/24 12:10 AM   Specimen: BLOOD LEFT ARM  Result Value Ref Range Status   Specimen Description   Final    BLOOD LEFT ARM Performed at [facility], [address] [place], [place] [address]    Special Requests   Final    BOTTLES DRAWN AEROBIC AND ANAEROBIC Blood Culture adequate volume Performed at [facility], [address], [place], [place] [address]    Culture   Final    NO GROWTH 1 DAY Performed at [facility], [address], [place], [place] [address]    Report Status PENDING  Incomplete  Gastrointestinal Panel by PCR , Stool     Status: None   Collection Time: 01/02/24  5:05 PM  Result Value Ref Range Status   Campylobacter species NOT DETECTED NOT DETECTED Final   Plesimonas shigelloides NOT DETECTED NOT DETECTED Final   Salmonella species NOT DETECTED NOT DETECTED Final   Yersinia enterocolitica NOT DETECTED NOT DETECTED Final   Vibrio species NOT DETECTED NOT DETECTED Final   Vibrio cholerae NOT DETECTED NOT DETECTED Final   Enteroaggregative E coli (EAEC) NOT DETECTED NOT DETECTED Final   Enteropathogenic E coli (EPEC) NOT DETECTED NOT DETECTED Final   Enterotoxigenic E coli (ETEC) NOT DETECTED NOT DETECTED Final   Shiga like toxin producing E coli (STEC) NOT DETECTED NOT DETECTED Final   Shigella/Enteroinvasive E coli (EIEC) NOT DETECTED NOT DETECTED Final   Cryptosporidium NOT DETECTED NOT DETECTED Final   Cyclospora cayetanensis NOT DETECTED NOT DETECTED Final   Entamoeba histolytica NOT DETECTED NOT DETECTED Final   Giardia lamblia NOT DETECTED NOT DETECTED Final   Adenovirus F40/41 NOT DETECTED NOT DETECTED Final   Astrovirus NOT DETECTED NOT DETECTED Final   Norovirus GI/GII NOT DETECTED NOT DETECTED Final   Rotavirus A NOT DETECTED NOT DETECTED Final   Sapovirus (I, II, IV, and V) NOT DETECTED NOT DETECTED Final    Comment: Performed at [facility], [address] [place], [place] [address]  C. Diff by PCR, Reflexed     Status: Abnormal   Collection Time: 01/02/24  5:05 PM  Result Value Ref Range Status   Toxigenic C. Difficile by PCR POSITIVE (A) NEGATIVE Final    Comment: Positive for toxigenic C. difficile with little to no toxin production. Only treat if clinical presentation suggests symptomatic illness.   Hypervirulent Strain PRESUMPTIVE NEGATIVE PRESUMPTIVE NEGATIVE Final    Comment: Performed at [facility], [address],  [place],  [place] [address]     Radiology Studies: CT CHEST ABDOMEN PELVIS W CONTRAST Result Date: 01/02/2024 EXAM: CT CHEST, ABDOMEN AND PELVIS WITH CONTRAST 01/02/2024 02:38:39 AM TECHNIQUE: CT of the chest, abdomen and pelvis was performed with the administration of 75 mL of iohexol  (OMNIPAQUE ) 300 MG/ML solution. Multiplanar reformatted images are provided for review. Automated exposure control, iterative reconstruction, and/or weight based adjustment of the mA/kV was utilized to reduce the radiation dose to as low as reasonably achievable. COMPARISON: 05/04/2020 CLINICAL HISTORY: Abdominal pain and nausea and vomiting for 2 days. FINDINGS: CHEST: MEDIASTINUM AND LYMPH NODES: Heart and pericardium are unremarkable. No cardiac enlargement is noted. Coronary calcifications are seen. The central airways are clear. No large central pulmonary embolus is noted. Thoracic inlet is within normal limits. No hilar or mediastinal adenopathy is noted. The esophagus is within normal limits. LUNGS AND PLEURA: Lungs are well aerated bilaterally. No focal infiltrate Or sizable effusion is noted. Diffuse emphysematous changes are seen. No sizable parenchymal nodule is noted. No pneumothorax. ABDOMEN AND PELVIS: LIVER: The liver is within normal limits, with the exception of a few small left lobe cysts stable from the prior exam. GALLBLADDER AND BILE DUCTS: The gallbladder is within normal limits. No biliary ductal dilatation. SPLEEN: The spleen is within normal limits. PANCREAS: The pancreas is within normal limits. ADRENAL GLANDS: Adrenal glands are unremarkable. KIDNEYS, URETERS AND BLADDER: Bilateral renal cysts are seen and stable. No follow-up is recommended. No renal calculi or obstructive changes are seen. No hydronephrosis. The bladder is well distended. GI AND BOWEL: Stomach and small bowel are within normal limits. No obstructive or inflammatory changes of the colon are noted. Diverticular changes seen without evidence of  diverticulitis. The appendix is not visualized, consistent with the prior surgical history. There is no bowel obstruction. REPRODUCTIVE ORGANS: The prostate is within normal limits. PERITONEUM AND RETROPERITONEUM: No ascites. No free air. VASCULATURE: Atherosclerotic calcifications of the thoracic aorta and its branches are seen. Abdominal Aortic calcifications are noted. ABDOMINAL AND PELVIS LYMPH NODES: No lymphadenopathy. BONES AND SOFT TISSUES: Degenerative changes of the thoracic spine are seen. IMPRESSION: 1. Diverticulosis without diverticulitis. 2. No acute abnormality noted. Electronically signed by: Oneil Devonshire MD 01/02/2024 02:55 AM EST RP Workstation: [id number]   DG Chest 2 View Result Date: 01/01/2024 CLINICAL DATA:  Cough, fever, and rhonchi EXAM: CHEST - 2 VIEW COMPARISON:  Chest radiograph dated 12/06/2023 FINDINGS: Normal lung volumes. Hazy opacity projecting over the right lower lung. No pleural effusion or pneumothorax. The heart size and mediastinal contours are within normal limits. No acute osseous abnormality. Nerve stimulator generator projects over the anteromedial right chest with lead terminating above the field of view. IMPRESSION: Hazy opacity projecting over the right lower lung, which may represent atelectasis or pneumonia. Electronically Signed   By: Limin  Xu M.D.   On: 01/01/2024 13:52    Scheduled Meds:  amLODipine   5 mg Oral BID   clopidogrel   75 mg Oral Daily   escitalopram   20 mg Oral QHS   heparin   5,000 Units Subcutaneous Q8H   Influenza vac split trivalent PF  0.5 mL Intramuscular Tomorrow-1000   rosuvastatin   20 mg Oral QHS   tamsulosin   0.4 mg Oral QHS   vancomycin   125 mg Oral QID   Continuous Infusions:   LOS: 1 day   Ivonne Mustache, MD [facility] P11/26/2025, 11:51 AM

## 2024-01-04 DIAGNOSIS — R6520 Severe sepsis without septic shock: Secondary | ICD-10-CM | POA: Diagnosis not present

## 2024-01-04 DIAGNOSIS — A419 Sepsis, unspecified organism: Secondary | ICD-10-CM | POA: Diagnosis not present

## 2024-01-04 DIAGNOSIS — J9600 Acute respiratory failure, unspecified whether with hypoxia or hypercapnia: Secondary | ICD-10-CM | POA: Diagnosis not present

## 2024-01-04 MED ORDER — POLYVINYL ALCOHOL 1.4 % OP SOLN
1.0000 [drp] | OPHTHALMIC | Status: DC | PRN
Start: 2024-01-04 — End: 2024-01-05
  Administered 2024-01-04: 1 [drp] via OPHTHALMIC
  Filled 2024-01-04: qty 15

## 2024-01-04 NOTE — Progress Notes (Signed)
Assumed care of pt from off going RN. No changes in initial am assessment at this time. Cont with plan of care

## 2024-01-04 NOTE — Progress Notes (Signed)
 PROGRESS NOTE  John Salazar  FMW:[id number] DOB: 1945-08-17 DOA: 01/01/2024 PCP: Wendee Lynwood HERO, NP   Brief Narrative:  Patient is a 78 year old male with history of hypertension, hyperlipidemia, GERD, peripheral vascular disease, coronary artery disease, CKD stage IIIb, BPH, OSA, depression, brain aneurysm who was brought to the emergency department because of persistent diarrhea, fever, vomiting, abdominal discomfort. Symptoms started on last Thursday. Patient was also reportedly confused. Patient has been on antibiotics over the last few months for placement of inspire device for sleep apnea and also for pneumonia. On presentation, he was hemodynamically stable, afebrile. CT chest/abdomen/pelvis showed diverticulosis without any signs of diverticulitis. Lab work showed elevated leukocytes, creatinine of 1.9(baseline creatinine of 1.4-1.6), lactic acid was mild elevated 2.1. He was started on broad spectrum antibiotics.  C. difficile antigen came out a positive.  Started on oral vancomycin .  Clinically improving.  Possible discharge tomorrow to home  Assessment & Plan:  Principal Problem:   Sepsis (HCC) Active Problems:   Hyperlipidemia LDL goal <70   Diarrhea   Coronary artery disease involving native coronary artery of native heart with angina pectoris   Carotid artery disease   OSA (obstructive sleep apnea)   Gastroesophageal reflux disease with esophagitis without hemorrhage   Acute kidney injury superimposed on chronic kidney disease   Depression   Sepsis secondary to C diff : Presented with fever, leukocytosis, mild elevated lactate, abdominal pain.  Initially started on bowel spectrum antibiotics. intra-abdominal source suspected but CT imaging does not show any acute findings.  C. difficile antigen came out to be positive.  Though toxin is negative.  He clearly has symptoms of C. difficile so started on oral vancomycin .  Other antibiotics  discontinued.  Blood cultures have been  negative so far.  He is clinically improving.  Less frequency of stools.  No abdominal pain, nausea or vomiting.  AKI on CKD stage IIIb: Prerenal AKI due to volume loss from diarrhea.  Baseline creatinine of 1.5-1.6.  Currently kidney function close to baseline.    Hypertension: Continue amlodipine .  Monitor blood pressure   Hyperlipidemia: Continue Crestor    GERD: Takes PPI at home.  Will hold on the background of C. difficile   History of coronary artery disease/peripheral vascular disease: On Plavix .  No anginal symptoms    BPH:On  Flomax    OSA: Intolerant of CPAP.  Recently had inspire device placement   Depression: On Lexapro         DVT prophylaxis:heparin  injection 5,000 Units Start: 01/02/24 1400     Code Status: Limited: Do not attempt resuscitation (DNR) -DNR-LIMITED -Do Not Intubate/DNI   Family Communication: Wife at bedside on 11/26.  Patient status: Inpatient  Patient is from : Home  Anticipated discharge to: Home  Estimated DC date: Tomorrow   Consultants: None  Procedures: None  Antimicrobials:  Anti-infectives (From admission, onward)    Start     Dose/Rate Route Frequency Ordered Stop   01/03/24 1000  vancomycin  (VANCOCIN ) capsule 125 mg        125 mg Oral 4 times daily 01/03/24 0838 01/13/24 0959   01/03/24 0500  vancomycin  (VANCOREADY) IVPB 750 mg/150 mL  Status:  Discontinued        750 mg 150 mL/hr over 60 Minutes Intravenous Every 24 hours 01/02/24 0913 01/03/24 0809   01/02/24 1500  metroNIDAZOLE  (FLAGYL ) IVPB 500 mg  Status:  Discontinued        500 mg 100 mL/hr over 60 Minutes Intravenous Every 12 hours 01/02/24 0809  01/03/24 0809   01/02/24 1400  ceFEPIme  (MAXIPIME ) 2 g in sodium chloride  0.9 % 100 mL IVPB  Status:  Discontinued        2 g 200 mL/hr over 30 Minutes Intravenous Every 12 hours 01/02/24 0913 01/03/24 0809   01/02/24 0215  ceFEPIme  (MAXIPIME ) 2 g in sodium chloride  0.9 % 100 mL IVPB        2 g 200 mL/hr over 30 Minutes  Intravenous  Once 01/02/24 0204 01/02/24 0357   01/02/24 0215  metroNIDAZOLE  (FLAGYL ) IVPB 500 mg        500 mg 100 mL/hr over 60 Minutes Intravenous  Once 01/02/24 0204 01/02/24 0405   01/02/24 0200  vancomycin  (VANCOREADY) IVPB 1500 mg/300 mL        1,500 mg 150 mL/hr over 120 Minutes Intravenous  Once 01/02/24 0158 01/02/24 0600   01/02/24 0200  piperacillin -tazobactam (ZOSYN ) IVPB 3.375 g  Status:  Discontinued        3.375 g 100 mL/hr over 30 Minutes Intravenous  Once 01/02/24 0158 01/02/24 0204       Subjective: Patient seen and examined at bedside today.  Feels much better today.  Less frequency of stools.  No abdominal pain or nausea or vomiting.  Stool is still loose   Objective: Vitals:   01/03/24 0410 01/03/24 1357 01/03/24 1951 01/04/24 0456  BP: 104/68 122/76 123/71 126/76  Pulse: 78 80 75 78  Resp: 14 18 17 17   Temp: 97.9 F (36.6 C) 98.3 F (36.8 C) 98.2 F (36.8 C) 98.2 F (36.8 C)  TempSrc:  Oral    SpO2: 95% 97% 96% 94%  Weight:      Height:        Intake/Output Summary (Last 24 hours) at 01/04/2024 1039 Last data filed at 01/04/2024 0930 Gross per 24 hour  Intake 1160 ml  Output --  Net 1160 ml   Filed Weights   01/02/24 0805  Weight: 76.4 kg    Examination:  General exam: Overall comfortable, not in distress HEENT: PERRL Respiratory system:  no wheezes or crackles  Cardiovascular system: S1 & S2 heard, RRR.  Gastrointestinal system: Abdomen is nondistended, soft and nontender. Central nervous system: Alert and oriented Extremities: No edema, no clubbing ,no cyanosis Skin: No rashes, no ulcers,no icterus     Data Reviewed: I have personally reviewed following labs and imaging studies  CBC: Recent Labs  Lab 01/01/24 1250 01/01/24 2356 01/02/24 0820 01/03/24 0517  WBC 17.2* 17.4* 16.1* 10.2  NEUTROABS 14.9* 15.9*  --   --   HGB 12.6* 13.5 11.2* 10.6*  HCT 37.6* 40.9 34.1* 32.6*  MCV 88.9 91.7 92.2 91.3  PLT 159.0 158 129* 129*    Basic Metabolic Panel: Recent Labs  Lab 01/01/24 1250 01/01/24 2356 01/02/24 0820 01/03/24 0517  NA 133* 133* 135 139  K 4.0 3.6 3.8 3.5  CL 101 98 103 109  CO2 26 21* 22 23  GLUCOSE 120* 130* 101* 101*  BUN 20 23 21 19   CREATININE 1.66* 1.96* 1.65* 1.52*  CALCIUM  9.2 10.1 9.3 8.8*  MG  --   --  1.9  --   PHOS  --   --  3.3  --      Recent Results (from the past 240 hours)  C Difficile Quick Screen w PCR reflex     Status: Abnormal   Collection Time: 01/01/24  5:05 PM   Specimen: STOOL  Result Value Ref Range Status   C Diff  antigen POSITIVE (A) NEGATIVE Final   C Diff toxin NEGATIVE NEGATIVE Final   C Diff interpretation Results are indeterminate. See PCR results.  Final    Comment: Performed at [facility], [address], [place], [place] [address]  Blood culture (routine x 2)     Status: None (Preliminary result)   Collection Time: 01/01/24 11:56 PM   Specimen: BLOOD  Result Value Ref Range Status   Specimen Description   Final    BLOOD SITE NOT SPECIFIED Performed at [facility], [address], [place], [place] [address]    Special Requests   Final    BOTTLES DRAWN AEROBIC AND ANAEROBIC Blood Culture adequate volume Performed at [facility], [address], [place], [place] [address]    Culture   Final    NO GROWTH 2 DAYS Performed at [facility], [address], [place], [place] [address]    Report Status PENDING  Incomplete  Blood culture (routine x 2)     Status: None (Preliminary result)   Collection Time: 01/02/24 12:10 AM   Specimen: BLOOD LEFT ARM  Result Value Ref Range Status   Specimen Description   Final    BLOOD LEFT ARM Performed at [facility], [address], [place], [place] [address]    Special Requests   Final    BOTTLES DRAWN AEROBIC AND ANAEROBIC Blood Culture adequate volume Performed at [facility], [address], [place], [place]  [address]    Culture   Final    NO GROWTH 2 DAYS Performed at [facility], [address] [place], [place] [address]    Report Status PENDING  Incomplete  Gastrointestinal Panel by PCR , Stool     Status: None   Collection Time: 01/02/24  5:05 PM  Result Value Ref Range Status   Campylobacter species NOT DETECTED NOT DETECTED Final   Plesimonas shigelloides NOT DETECTED NOT DETECTED Final   Salmonella species NOT DETECTED NOT DETECTED Final   Yersinia enterocolitica NOT DETECTED NOT DETECTED Final   Vibrio species NOT DETECTED NOT DETECTED Final   Vibrio cholerae NOT DETECTED NOT DETECTED Final   Enteroaggregative E coli (EAEC) NOT DETECTED NOT DETECTED Final   Enteropathogenic E coli (EPEC) NOT DETECTED NOT DETECTED Final   Enterotoxigenic E coli (ETEC) NOT DETECTED NOT DETECTED Final   Shiga like toxin producing E coli (STEC) NOT DETECTED NOT DETECTED Final   Shigella/Enteroinvasive E coli (EIEC) NOT DETECTED NOT DETECTED Final   Cryptosporidium NOT DETECTED NOT DETECTED Final   Cyclospora cayetanensis NOT DETECTED NOT DETECTED Final   Entamoeba histolytica NOT DETECTED NOT DETECTED Final   Giardia lamblia NOT DETECTED NOT DETECTED Final   Adenovirus F40/41 NOT DETECTED NOT DETECTED Final   Astrovirus NOT DETECTED NOT DETECTED Final   Norovirus GI/GII NOT DETECTED NOT DETECTED Final   Rotavirus A NOT DETECTED NOT DETECTED Final   Sapovirus (I, II, IV, and V) NOT DETECTED NOT DETECTED Final    Comment: Performed at [facility], [address] [place], [place] [address]  C. Diff by PCR, Reflexed     Status: Abnormal   Collection Time: 01/02/24  5:05 PM  Result Value Ref Range Status   Toxigenic C. Difficile by PCR POSITIVE (A) NEGATIVE Final    Comment: Positive for toxigenic C. difficile with little to no toxin production. Only treat if clinical presentation suggests symptomatic illness.   Hypervirulent Strain PRESUMPTIVE  NEGATIVE PRESUMPTIVE NEGATIVE Final     Comment: Performed at [facility], [address], [place], [place] [address]     Radiology Studies: No results found.   Scheduled Meds:  amLODipine   5 mg Oral BID   clopidogrel   75 mg Oral Daily   escitalopram   20 mg Oral QHS   heparin   5,000 Units Subcutaneous Q8H   Influenza vac split trivalent PF  0.5 mL Intramuscular Tomorrow-1000   rosuvastatin   20 mg Oral QHS   tamsulosin   0.4 mg Oral QHS   vancomycin   125 mg Oral QID   Continuous Infusions:   LOS: 2 days   Ivonne Mustache, MD [facility] P11/27/2025, 10:39 AM

## 2024-01-04 NOTE — Plan of Care (Signed)

## 2024-01-05 ENCOUNTER — Other Ambulatory Visit (HOSPITAL_COMMUNITY): Payer: Self-pay

## 2024-01-05 DIAGNOSIS — A419 Sepsis, unspecified organism: Secondary | ICD-10-CM | POA: Diagnosis not present

## 2024-01-05 DIAGNOSIS — R6520 Severe sepsis without septic shock: Secondary | ICD-10-CM | POA: Diagnosis not present

## 2024-01-05 DIAGNOSIS — J9600 Acute respiratory failure, unspecified whether with hypoxia or hypercapnia: Secondary | ICD-10-CM | POA: Diagnosis not present

## 2024-01-05 LAB — BASIC METABOLIC PANEL WITH GFR
Anion gap: 9 (ref 5–15)
BUN: 8 mg/dL (ref 8–23)
CO2: 24 mmol/L (ref 22–32)
Calcium: 9 mg/dL (ref 8.9–10.3)
Chloride: 106 mmol/L (ref 98–111)
Creatinine, Ser: 1.18 mg/dL (ref 0.61–1.24)
GFR, Estimated: >60 mL/min (ref >60)
Glucose, Bld: 98 mg/dL (ref 70–99)
Potassium: 3.2 mmol/L — ABNORMAL LOW (ref 3.5–5.1)
Sodium: 140 mmol/L (ref 135–145)

## 2024-01-05 LAB — CBC
HCT: 34.3 % — ABNORMAL LOW (ref 39.0–52.0)
Hemoglobin: 11.4 g/dL — ABNORMAL LOW (ref 13.0–17.0)
MCH: 29.7 pg (ref 26.0–34.0)
MCHC: 33.2 g/dL (ref 30.0–36.0)
MCV: 89.3 fL (ref 80.0–100.0)
Platelets: 173 K/uL (ref 150–400)
RBC: 3.84 MIL/uL — ABNORMAL LOW (ref 4.22–5.81)
RDW: 14.6 % (ref 11.5–15.5)
WBC: 6.8 K/uL (ref 4.0–10.5)
nRBC: 0 % (ref 0.0–0.2)

## 2024-01-05 MED ORDER — VANCOMYCIN HCL 125 MG PO CAPS
125.0000 mg | ORAL_CAPSULE | Freq: Four times a day (QID) | ORAL | 0 refills | Status: AC
  Filled 2024-01-05: qty 32, 8d supply, fill #0

## 2024-01-05 MED ORDER — KETOROLAC TROMETHAMINE 0.5 % OP SOLN
1.0000 [drp] | Freq: Four times a day (QID) | OPHTHALMIC | Status: DC
  Administered 2024-01-05: 1 [drp] via OPHTHALMIC
  Filled 2024-01-05: qty 5

## 2024-01-05 MED ORDER — POTASSIUM CHLORIDE CRYS ER 20 MEQ PO TBCR
40.0000 meq | EXTENDED_RELEASE_TABLET | Freq: Once | ORAL | Status: AC
  Administered 2024-01-05: 40 meq via ORAL
  Filled 2024-01-05: qty 2

## 2024-01-05 MED ORDER — KETOROLAC TROMETHAMINE 0.5 % OP SOLN
1.0000 [drp] | Freq: Four times a day (QID) | OPHTHALMIC | 0 refills | Status: DC
  Filled 2024-01-05: qty 5, 25d supply, fill #0

## 2024-01-05 NOTE — Progress Notes (Signed)
 AVS instructions reviewed with pt, all questions answered. Ivs removed. Meds delivered. Pt has called wife for transportation. Awaiting ride to arrive.

## 2024-01-05 NOTE — Discharge Summary (Signed)
 Physician Discharge Summary  John Salazar FMW:[id number] DOB: [birth year] DOA: 01/01/2024  PCP: Wendee Lynwood HERO, NP  Admit date: 01/01/2024 Discharge date: 01/05/2024  Admitted From: Home Disposition:  Home  Discharge Condition:Stable CODE STATUS: DNR Diet recommendation: Heart Healthy  Brief/Interim Summary:  Patient is a 78 year old male with history of hypertension, hyperlipidemia, GERD, peripheral vascular disease, coronary artery disease, CKD stage IIIb, BPH, OSA, depression, brain aneurysm who was brought to the emergency department because of persistent diarrhea, fever, vomiting, abdominal discomfort. Symptoms started on last Thursday. Patient was also reportedly confused. Patient has been on antibiotics over the last few months for placement of inspire device for sleep apnea and also for pneumonia. On presentation, he was hemodynamically stable, afebrile. CT chest/abdomen/pelvis showed diverticulosis without any signs of diverticulitis. Lab work showed elevated leukocytes, creatinine of 1.9(baseline creatinine of 1.4-1.6), lactic acid was mild elevated 2.1. He was started on broad spectrum antibiotics.  C. difficile antigen came out a positive.  Started on oral vancomycin .  Now clinically improved.  Diarrhea has resolved.  Medically stable for discharge to home today.   Following problems were addressed during the hospitalization:  Sepsis secondary to C diff : Presented with fever, leukocytosis, mild elevated lactate, abdominal pain.  Initially started on bowel spectrum antibiotics. intra-abdominal source suspected but CT imaging does not show any acute findings.  C. difficile antigen came out to be positive.  Though toxin is negative.  He clearly has symptoms of C. difficile so started on oral vancomycin .  Other antibiotics  discontinued.  Blood cultures have been negative so far.  No abdominal pain, nausea or vomiting.  Diarrhea resolved.  Continue vancomycin  to complete 10 days  course.   AKI on CKD stage IIIb: Prerenal AKI due to volume loss from diarrhea.  Baseline creatinine of 1.5-1.6.  Currently kidney function at  baseline.    Hypertension: Continue amlodipine .  Monitor blood pressure at home   Hyperlipidemia: Continue Crestor    GERD: Takes PPI at home.    History of coronary artery disease/peripheral vascular disease: On aspirin .  No anginal symptoms    BPH:On  Flomax    OSA: Intolerant of CPAP.  Recently had inspire device placement   Depression: On Lexapro      Discharge Diagnoses:  Principal Problem:   Sepsis (HCC) Active Problems:   Hyperlipidemia LDL goal <70   Diarrhea   Coronary artery disease involving native coronary artery of native heart with angina pectoris   Carotid artery disease   OSA (obstructive sleep apnea)   Gastroesophageal reflux disease with esophagitis without hemorrhage   Acute kidney injury superimposed on chronic kidney disease   Depression    Discharge Instructions  Discharge Instructions     Diet - low sodium heart healthy   Complete by: As directed    Discharge instructions   Complete by: As directed    1)Please take your medications as instructed 2)Follow up with your PCP next week   Increase activity slowly   Complete by: As directed       Allergies as of 01/05/2024       Reactions   Bee Venom Anaphylaxis, Shortness Of Breath   Penicillins Swelling, Rash, Other (See Comments)   As a child        Medication List     STOP taking these medications    azithromycin  250 MG tablet Commonly known as: ZITHROMAX    cefpodoxime  200 MG tablet Commonly known as: VANTIN    methylPREDNISolone  4 MG Tbpk tablet Commonly known as:  MEDROL  DOSEPAK       TAKE these medications    amLODipine  5 MG tablet Commonly known as: NORVASC  Take 1 tablet (5 mg total) by mouth 2 (two) times daily. What changed:  when to take this additional instructions   aspirin  EC 81 MG tablet Take 1 tablet (81 mg  total) by mouth daily. Swallow whole. What changed: when to take this   cetirizine  10 MG tablet Commonly known as: ZYRTEC  Take 0.5 tablets (5 mg total) by mouth at bedtime. What changed:  when to take this reasons to take this   escitalopram  20 MG tablet Commonly known as: LEXAPRO  Take 1 tablet (20 mg total) by mouth daily. What changed: when to take this   famotidine  20 MG tablet Commonly known as: PEPCID  Take 20 mg by mouth daily as needed for heartburn or indigestion.   isosorbide  mononitrate 30 MG 24 hr tablet Commonly known as: IMDUR  Take 0.5 tablets (15 mg total) by mouth daily. AS NEEDED What changed:  when to take this reasons to take this additional instructions   ketorolac 0.5 % ophthalmic solution Commonly known as: ACULAR Place 1 drop into the left eye 4 (four) times daily for 5 days.   Mucinex Fast-Max DM Max 20-400 MG/20ML Liqd Generic drug: Dextromethorphan-guaiFENesin Take 20 mLs by mouth every 6 (six) hours as needed (for cold-like synptoms).   nitroGLYCERIN  0.4 MG SL tablet Commonly known as: Nitrostat  Place 1 tablet (0.4 mg total) under the tongue every 5 (five) minutes as needed for chest pain.   rosuvastatin  40 MG tablet Commonly known as: CRESTOR  Take one (1) tablet by mouth (40 mg) daily. What changed: See the new instructions.   tamsulosin  0.4 MG Caps capsule Commonly known as: Flomax  Take one capsule once daily ( thirty minutes after same meal daily) What changed:  how much to take how to take this when to take this additional instructions   Tylenol  325 MG tablet Generic drug: acetaminophen  Take 325-650 mg by mouth every 6 (six) hours as needed for mild pain (pain score 1-3) (or headaches).   vancomycin  125 MG capsule Commonly known as: VANCOCIN  Take 1 capsule (125 mg total) by mouth 4 (four) times daily for 8 days.   Voquezna  10 MG Tabs Generic drug: Vonoprazan Fumarate  Take 10 mg by mouth daily. What changed: when to take  this        Follow-up Information     Wendee Lynwood HERO, NP. Schedule an appointment as soon as possible for a visit in 1 week(s).   Specialties: Nurse Practitioner, Family Medicine Contact information: [address] [place] [address] [phone]                Allergies  Allergen Reactions   Bee Venom Anaphylaxis and Shortness Of Breath   Penicillins Swelling, Rash and Other (See Comments)    As a child     Consultations: None   Procedures/Studies: CT CHEST ABDOMEN PELVIS W CONTRAST Result Date: 01/02/2024 EXAM: CT CHEST, ABDOMEN AND PELVIS WITH CONTRAST 01/02/2024 02:38:39 AM TECHNIQUE: CT of the chest, abdomen and pelvis was performed with the administration of 75 mL of iohexol  (OMNIPAQUE ) 300 MG/ML solution. Multiplanar reformatted images are provided for review. Automated exposure control, iterative reconstruction, and/or weight based adjustment of the mA/kV was utilized to reduce the radiation dose to as low as reasonably achievable. COMPARISON: 05/04/2020 CLINICAL HISTORY: Abdominal pain and nausea and vomiting for 2 days. FINDINGS: CHEST: MEDIASTINUM AND LYMPH NODES: Heart and pericardium  are unremarkable. No cardiac enlargement is noted. Coronary calcifications are seen. The central airways are clear. No large central pulmonary embolus is noted. Thoracic inlet is within normal limits. No hilar or mediastinal adenopathy is noted. The esophagus is within normal limits. LUNGS AND PLEURA: Lungs are well aerated bilaterally. No focal infiltrate Or sizable effusion is noted. Diffuse emphysematous changes are seen. No sizable parenchymal nodule is noted. No pneumothorax. ABDOMEN AND PELVIS: LIVER: The liver is within normal limits, with the exception of a few small left lobe cysts stable from the prior exam. GALLBLADDER AND BILE DUCTS: The gallbladder is within normal limits. No biliary ductal dilatation. SPLEEN: The spleen is within normal limits. PANCREAS: The  pancreas is within normal limits. ADRENAL GLANDS: Adrenal glands are unremarkable. KIDNEYS, URETERS AND BLADDER: Bilateral renal cysts are seen and stable. No follow-up is recommended. No renal calculi or obstructive changes are seen. No hydronephrosis. The bladder is well distended. GI AND BOWEL: Stomach and small bowel are within normal limits. No obstructive or inflammatory changes of the colon are noted. Diverticular changes seen without evidence of diverticulitis. The appendix is not visualized, consistent with the prior surgical history. There is no bowel obstruction. REPRODUCTIVE ORGANS: The prostate is within normal limits. PERITONEUM AND RETROPERITONEUM: No ascites. No free air. VASCULATURE: Atherosclerotic calcifications of the thoracic aorta and its branches are seen. Abdominal Aortic calcifications are noted. ABDOMINAL AND PELVIS LYMPH NODES: No lymphadenopathy. BONES AND SOFT TISSUES: Degenerative changes of the thoracic spine are seen. IMPRESSION: 1. Diverticulosis without diverticulitis. 2. No acute abnormality noted. Electronically signed by: Oneil Devonshire MD 01/02/2024 02:55 AM EST RP Workstation: [id number]   DG Chest 2 View Result Date: 01/01/2024 CLINICAL DATA:  Cough, fever, and rhonchi EXAM: CHEST - 2 VIEW COMPARISON:  Chest radiograph dated 12/06/2023 FINDINGS: Normal lung volumes. Hazy opacity projecting over the right lower lung. No pleural effusion or pneumothorax. The heart size and mediastinal contours are within normal limits. No acute osseous abnormality. Nerve stimulator generator projects over the anteromedial right chest with lead terminating above the field of view. IMPRESSION: Hazy opacity projecting over the right lower lung, which may represent atelectasis or pneumonia. Electronically Signed   By: Limin  Xu M.D.   On: 01/01/2024 13:52   X-ray chest PA or AP Result Date: 12/06/2023 EXAM: 1 VIEW(S) XRAY OF THE CHEST 12/06/2023 06:07:00 PM COMPARISON: 10/23/2023 CLINICAL  HISTORY: [id number] Pneumothorax [id number]. Pneumothorax; Post op pacu imaging Pneumothorax; Post op pacu imaging FINDINGS: LINES, TUBES AND DEVICES: Stimulator device is seen over right chest lead extending to right neck. LUNGS AND PLEURA: Minimal bibasilar subsegmental atelectasis is noted. No pulmonary edema. No pleural effusion. No pneumothorax. HEART AND MEDIASTINUM: No acute abnormality of the cardiac and mediastinal silhouettes. BONES AND SOFT TISSUES: No acute osseous abnormality. IMPRESSION: 1. No acute cardiopulmonary process. 2. Minimal bibasilar subsegmental atelectasis. 3. Right chest stimulator device with lead extending into the right neck. Electronically signed by: Lynwood Seip MD 12/06/2023 06:15 PM EDT RP Workstation: [id number]      Subjective: Patient seen and examined the bedside today.  Hemodynamically stable comfortable.  No diarrhea since yesterday.  No abdomen pain, nausea or vomiting.  Medically stable for discharge.  Discussed with wife at bedside about discharge planning  Discharge Exam: Vitals:   01/04/24 1930 01/05/24 0650  BP: 127/69 (!) 143/82  Pulse: 72 78  Resp: 18 18  Temp: 98.5 F (36.9 C) 98.7 F (37.1 C)  SpO2: 96% 96%   Vitals:   01/04/24 1525 01/04/24  1705 01/04/24 1930 01/05/24 0650  BP: 130/77 (!) 154/78 127/69 (!) 143/82  Pulse: 74  72 78  Resp: 16  18 18   Temp: 97.8 F (36.6 C)  98.5 F (36.9 C) 98.7 F (37.1 C)  TempSrc: Oral     SpO2: 98%  96% 96%  Weight:      Height:        General: Pt is alert, awake, not in acute distress Cardiovascular: RRR, S1/S2 +, no rubs, no gallops Respiratory: CTA bilaterally, no wheezing, no rhonchi Abdominal: Soft, NT, ND, bowel sounds + Extremities: no edema, no cyanosis    The results of significant diagnostics from this hospitalization (including imaging, microbiology, ancillary and laboratory) are listed below for reference.     Microbiology: Recent Results (from the past 240 hours)  C Difficile  Quick Screen w PCR reflex     Status: Abnormal   Collection Time: 01/01/24  5:05 PM   Specimen: STOOL  Result Value Ref Range Status   C Diff antigen POSITIVE (A) NEGATIVE Final   C Diff toxin NEGATIVE NEGATIVE Final   C Diff interpretation Results are indeterminate. See PCR results.  Final    Comment: Performed at [facility], [address] [place], [place] [address]  Blood culture (routine x 2)     Status: None (Preliminary result)   Collection Time: 01/01/24 11:56 PM   Specimen: BLOOD  Result Value Ref Range Status   Specimen Description   Final    BLOOD SITE NOT SPECIFIED Performed at [facility], [address], [place], [place] [address]    Special Requests   Final    BOTTLES DRAWN AEROBIC AND ANAEROBIC Blood Culture adequate volume Performed at [facility], [address] [place], [place] [address]    Culture   Final    NO GROWTH 2 DAYS Performed at [facility], [address], [place], [place] [address]    Report Status PENDING  Incomplete  Blood culture (routine x 2)     Status: None (Preliminary result)   Collection Time: 01/02/24 12:10 AM   Specimen: BLOOD LEFT ARM  Result Value Ref Range Status   Specimen Description   Final    BLOOD LEFT ARM Performed at [facility], [address] [place], [place] [address]    Special Requests   Final    BOTTLES DRAWN AEROBIC AND ANAEROBIC Blood Culture adequate volume Performed at [facility], [address] [place], [place] [address]    Culture   Final    NO GROWTH 2 DAYS Performed at [facility], [address] [place], [place] [address]    Report Status PENDING  Incomplete  Gastrointestinal Panel by PCR , Stool     Status: None   Collection Time: 01/02/24  5:05 PM  Result Value Ref Range Status   Campylobacter species NOT DETECTED NOT DETECTED Final   Plesimonas shigelloides NOT DETECTED NOT DETECTED Final    Salmonella species NOT DETECTED NOT DETECTED Final   Yersinia enterocolitica NOT DETECTED NOT DETECTED Final   Vibrio species NOT DETECTED NOT DETECTED Final   Vibrio cholerae NOT DETECTED NOT DETECTED Final   Enteroaggregative E coli (EAEC) NOT DETECTED NOT DETECTED Final   Enteropathogenic E coli (EPEC) NOT DETECTED NOT DETECTED Final   Enterotoxigenic E coli (ETEC) NOT DETECTED NOT DETECTED Final   Shiga like toxin producing E coli (STEC) NOT DETECTED NOT DETECTED Final  Shigella/Enteroinvasive E coli (EIEC) NOT DETECTED NOT DETECTED Final   Cryptosporidium NOT DETECTED NOT DETECTED Final   Cyclospora cayetanensis NOT DETECTED NOT DETECTED Final   Entamoeba histolytica NOT DETECTED NOT DETECTED Final   Giardia lamblia NOT DETECTED NOT DETECTED Final   Adenovirus F40/41 NOT DETECTED NOT DETECTED Final   Astrovirus NOT DETECTED NOT DETECTED Final   Norovirus GI/GII NOT DETECTED NOT DETECTED Final   Rotavirus A NOT DETECTED NOT DETECTED Final   Sapovirus (I, II, IV, and V) NOT DETECTED NOT DETECTED Final    Comment: Performed at [facility], [address], [place], [place] [address]  C. Diff by PCR, Reflexed     Status: Abnormal   Collection Time: 01/02/24  5:05 PM  Result Value Ref Range Status   Toxigenic C. Difficile by PCR POSITIVE (A) NEGATIVE Final    Comment: Positive for toxigenic C. difficile with little to no toxin production. Only treat if clinical presentation suggests symptomatic illness.   Hypervirulent Strain PRESUMPTIVE NEGATIVE PRESUMPTIVE NEGATIVE Final    Comment: Performed at [facility], [address], [place], [place] [address]     Labs: BNP (last 3 results) No results for input(s): BNP in the last 8760 hours. Basic Metabolic Panel: Recent Labs  Lab 01/01/24 1250 01/01/24 2356 01/02/24 0820 01/03/24 0517 01/05/24 0443  NA 133* 133* 135 139 140  K 4.0 3.6 3.8 3.5 3.2*  CL 101 98 103 109 106  CO2 26 21* 22 23 24   GLUCOSE 120* 130*  101* 101* 98  BUN 20 23 21 19 8   CREATININE 1.66* 1.96* 1.65* 1.52* 1.18  CALCIUM  9.2 10.1 9.3 8.8* 9.0  MG  --   --  1.9  --   --   PHOS  --   --  3.3  --   --    Liver Function Tests: Recent Labs  Lab 01/01/24 1250 01/01/24 2356  AST 17 21  ALT 22 25  ALKPHOS 72 91  BILITOT 0.7 0.8  PROT 6.8 7.5  ALBUMIN  4.0 4.1   Recent Labs  Lab 01/01/24 1250 01/01/24 2356  LIPASE 10.0* 19   Recent Labs  Lab 01/01/24 1250  AMMONIA 26   CBC: Recent Labs  Lab 01/01/24 1250 01/01/24 2356 01/02/24 0820 01/03/24 0517 01/05/24 0443  WBC 17.2* 17.4* 16.1* 10.2 6.8  NEUTROABS 14.9* 15.9*  --   --   --   HGB 12.6* 13.5 11.2* 10.6* 11.4*  HCT 37.6* 40.9 34.1* 32.6* 34.3*  MCV 88.9 91.7 92.2 91.3 89.3  PLT 159.0 158 129* 129* 173   Cardiac Enzymes: No results for input(s): CKTOTAL, CKMB, CKMBINDEX, TROPONINI in the last 168 hours. BNP: Invalid input(s): POCBNP CBG: No results for input(s): GLUCAP in the last 168 hours. D-Dimer No results for input(s): DDIMER in the last 72 hours. Hgb A1c No results for input(s): HGBA1C in the last 72 hours. Lipid Profile No results for input(s): CHOL, HDL, LDLCALC, TRIG, CHOLHDL, LDLDIRECT in the last 72 hours. Thyroid  function studies No results for input(s): TSH, T4TOTAL, T3FREE, THYROIDAB in the last 72 hours.  Invalid input(s): FREET3 Anemia work up No results for input(s): VITAMINB12, FOLATE, FERRITIN, TIBC, IRON, RETICCTPCT in the last 72 hours. Urinalysis    Component Value Date/Time   COLORURINE YELLOW 01/01/2024 1250   APPEARANCEUR CLEAR 01/01/2024 1250   LABSPEC 1.021 01/01/2024 1250   PHURINE 5.5 01/01/2024 1250   GLUCOSEU NEGATIVE 01/01/2024 1250   HGBUR TRACE (A) 01/01/2024 1250  BILIRUBINUR Negative 10/26/2021 0835   KETONESUR TRACE (A) 01/01/2024 1250   PROTEINUR 1+ (A) 01/01/2024 1250   UROBILINOGEN 0.2 10/26/2021 0835   NITRITE Negative 10/26/2021 0835    LEUKOCYTESUR Negative 10/26/2021 0835   Sepsis Labs Recent Labs  Lab 01/01/24 2356 01/02/24 0820 01/03/24 0517 01/05/24 0443  WBC 17.4* 16.1* 10.2 6.8   Microbiology Recent Results (from the past 240 hours)  C Difficile Quick Screen w PCR reflex     Status: Abnormal   Collection Time: 01/01/24  5:05 PM   Specimen: STOOL  Result Value Ref Range Status   C Diff antigen POSITIVE (A) NEGATIVE Final   C Diff toxin NEGATIVE NEGATIVE Final   C Diff interpretation Results are indeterminate. See PCR results.  Final    Comment: Performed at [facility], [address], [place], [place] [address]  Blood culture (routine x 2)     Status: None (Preliminary result)   Collection Time: 01/01/24 11:56 PM   Specimen: BLOOD  Result Value Ref Range Status   Specimen Description   Final    BLOOD SITE NOT SPECIFIED Performed at [facility], [address] [place], [place] [address]    Special Requests   Final    BOTTLES DRAWN AEROBIC AND ANAEROBIC Blood Culture adequate volume Performed at [facility], [address], [place], [place] [address]    Culture   Final    NO GROWTH 2 DAYS Performed at [facility], [address], [place], [place] [address]    Report Status PENDING  Incomplete  Blood culture (routine x 2)     Status: None (Preliminary result)   Collection Time: 01/02/24 12:10 AM   Specimen: BLOOD LEFT ARM  Result Value Ref Range Status   Specimen Description   Final    BLOOD LEFT ARM Performed at [facility], [address] [place], [place] [address]    Special Requests   Final    BOTTLES DRAWN AEROBIC AND ANAEROBIC Blood Culture adequate volume Performed at [facility], [address], [place], [place] [address]    Culture   Final    NO GROWTH 2 DAYS Performed at [facility], [address], [place], [place] [address]    Report Status PENDING  Incomplete  Gastrointestinal  Panel by PCR , Stool     Status: None   Collection Time: 01/02/24  5:05 PM  Result Value Ref Range Status   Campylobacter species NOT DETECTED NOT DETECTED Final   Plesimonas shigelloides NOT DETECTED NOT DETECTED Final   Salmonella species NOT DETECTED NOT DETECTED Final   Yersinia enterocolitica NOT DETECTED NOT DETECTED Final   Vibrio species NOT DETECTED NOT DETECTED Final   Vibrio cholerae NOT DETECTED NOT DETECTED Final   Enteroaggregative E coli (EAEC) NOT DETECTED NOT DETECTED Final   Enteropathogenic E coli (EPEC) NOT DETECTED NOT DETECTED Final   Enterotoxigenic E coli (ETEC) NOT DETECTED NOT DETECTED Final   Shiga like toxin producing E coli (STEC) NOT DETECTED NOT DETECTED Final   Shigella/Enteroinvasive E coli (EIEC) NOT DETECTED NOT DETECTED Final   Cryptosporidium NOT DETECTED NOT DETECTED Final   Cyclospora cayetanensis NOT DETECTED NOT DETECTED Final   Entamoeba histolytica NOT DETECTED NOT DETECTED Final   Giardia lamblia NOT DETECTED NOT DETECTED Final   Adenovirus F40/41 NOT DETECTED NOT DETECTED Final   Astrovirus NOT DETECTED NOT DETECTED Final   Norovirus GI/GII NOT DETECTED NOT  DETECTED Final   Rotavirus A NOT DETECTED NOT DETECTED Final   Sapovirus (I, II, IV, and V) NOT DETECTED NOT DETECTED Final    Comment: Performed at [facility], [address] [place], [place] [address]  C. Diff by PCR, Reflexed     Status: Abnormal   Collection Time: 01/02/24  5:05 PM  Result Value Ref Range Status   Toxigenic C. Difficile by PCR POSITIVE (A) NEGATIVE Final    Comment: Positive for toxigenic C. difficile with little to no toxin production. Only treat if clinical presentation suggests symptomatic illness.   Hypervirulent Strain PRESUMPTIVE NEGATIVE PRESUMPTIVE NEGATIVE Final    Comment: Performed at [facility], [address] [place], [place] [address]    Please note: You were cared for by a hospitalist during your hospital stay. Once you are  discharged, your primary care physician will handle any further medical issues. Please note that NO REFILLS for any discharge medications will be authorized once you are discharged, as it is imperative that you return to your primary care physician (or establish a relationship with a primary care physician if you do not have one) for your post hospital discharge needs so that they can reassess your need for medications and monitor your lab values.    Time coordinating discharge: 40 minutes  SIGNED:   Ivonne Mustache, MD  [facility] 01/05/2024, 10:08 AM Pager [phone]  If 7PM-7AM, please contact night-coverage [email or web address] Password [email or web address]

## 2024-01-05 NOTE — TOC Transition Note (Signed)
 Transition of Care [facility]) - Discharge Note   Patient Details  Name: John Salazar MRN: [id number] Date of Birth: [birth year]  [facility] CM/SW Contact:  Bascom Service, RN Phone Number: 01/05/2024, 11:54 AM   Clinical Narrative: d/c home no CM needs.      Final next level of care: Home/Self Care Barriers to Discharge: No Barriers Identified   Patient Goals and CMS Choice Patient states their goals for this hospitalization and ongoing recovery are:: Home CMS Medicare.gov Compare Post Acute Care list provided to:: Patient Represenative (must comment) (Pam(spouse)) Choice offered to / list presented to : Spouse [place] ownership interest in [facility].provided to:: Spouse    Discharge Placement                       Discharge Plan and Services Additional resources added to the After Visit Summary for     Discharge Planning Services: CM Consult                                 Social Drivers of Health (SDOH) Interventions SDOH Screenings   Food Insecurity: No Food Insecurity (01/02/2024)  Housing: Low Risk  (01/02/2024)  Transportation Needs: No Transportation Needs (01/02/2024)  Utilities: Not At Risk (01/02/2024)  Alcohol  Screen: Low Risk  (05/18/2023)  Depression (PHQ2-9): Low Risk  (01/01/2024)  Recent Concern: Depression (PHQ2-9) - High Risk (10/23/2023)  Financial Resource Strain: Low Risk  (08/22/2023)  Physical Activity: Insufficiently Active (08/22/2023)  Social Connections: Socially Integrated (01/02/2024)  Stress: No Stress Concern Present (08/22/2023)  Tobacco Use: Medium Risk (01/02/2024)  Health Literacy: Adequate Health Literacy (05/18/2023)     Readmission Risk Interventions     No data to display

## 2024-01-07 LAB — CULTURE, BLOOD (ROUTINE X 2)
Culture: NO GROWTH
Culture: NO GROWTH
Special Requests: ADEQUATE
Special Requests: ADEQUATE

## 2024-01-08 ENCOUNTER — Encounter: Payer: Self-pay | Admitting: Nurse Practitioner

## 2024-01-08 ENCOUNTER — Telehealth: Payer: Self-pay

## 2024-01-08 DIAGNOSIS — H2000 Unspecified acute and subacute iridocyclitis: Secondary | ICD-10-CM | POA: Diagnosis not present

## 2024-01-08 DIAGNOSIS — Z961 Presence of intraocular lens: Secondary | ICD-10-CM | POA: Diagnosis not present

## 2024-01-08 NOTE — Transitions of Care (Post Inpatient/ED Visit) (Signed)
 01/08/2024  Name: John Salazar MRN: [id number] DOB: 01-May-1945  Today's TOC FU Call Status: Today's TOC FU Call Status:: Successful TOC FU Call Completed Unsuccessful Call (1st Attempt) Date: 01/08/24 [facility] FU Call Complete Date: 01/08/24  Patient's Name and Date of Birth confirmed. Name, DOB  Transition Care Management Follow-up Telephone Call Date of Discharge: 01/05/24 Discharge Facility: [facility]) Type of Discharge: Inpatient Admission Primary Inpatient Discharge Diagnosis:: sepsis How have you been since you were released from the hospital?: Better Any questions or concerns?: Yes Patient Questions/Concerns:: patient states a few days before he left the hospital he started having eye discomfort/ scratchy feeling.  He states he was given eye drops while in hospital.  Patient reports having an eye doctor appointment today for further follow up on eye symptoms. Patient Questions/Concerns Addressed: Other: (discussed concerns with patient.)  Items Reviewed: Did you receive and understand the discharge instructions provided?: Yes Medications obtained,verified, and reconciled?: Yes (Medications Reviewed) Any new allergies since your discharge?: No Dietary orders reviewed?: Yes Type of Diet Ordered:: low salt heart healthy Do you have support at home?: Yes People in Home [RPT]: spouse Name of Support/Comfort Primary Source: [employer]  Medications Reviewed Today: Medications Reviewed Today     Reviewed by Kileigh Ortmann E, RN (Registered Nurse) on 01/08/24 at 1104  Med List Status: <None>   Medication Order Taking? Sig Documenting Provider Last Dose Status Informant  amLODipine  (NORVASC ) 5 MG tablet [id number] Yes Take 1 tablet (5 mg total) by mouth 2 (two) times daily.  Patient taking differently: Take 5 mg by mouth See admin instructions. Am and phyllis Okey Vina LULLA, MD  Active Self  aspirin  EC 81 MG tablet [id number] Yes Take 1 tablet (81 mg total) by mouth daily. Swallow  whole.  Patient taking differently: Take 81 mg by mouth in the morning. Swallow whole.   Okey Vina LULLA, MD  Active Self  cetirizine  (ZYRTEC ) 10 MG tablet [id number]  Take 0.5 tablets (5 mg total) by mouth at bedtime.  Patient not taking: Reported on 01/08/2024   Anice Riis, DO  Active Self  escitalopram  (LEXAPRO ) 20 MG tablet [id number] Yes Take 1 tablet (20 mg total) by mouth daily.  Patient taking differently: Take 20 mg by mouth at bedtime.   Dugal, Tabitha, FNP  Active Self  famotidine  (PEPCID ) 20 MG tablet [id number] Yes Take 20 mg by mouth daily as needed for heartburn or indigestion. [provider]  Active Self  isosorbide  mononitrate (IMDUR ) 30 MG 24 hr tablet [id number] Yes Take 0.5 tablets (15 mg total) by mouth daily. AS NEEDED  Patient taking differently: Take 15 mg by mouth daily as needed (for fatigue, to increase blood flow.).   Okey Vina LULLA, MD  Active Self  ketorolac (ACULAR) 0.5 % ophthalmic solution [id number] Yes Place 1 drop into the left eye 4 (four) times daily for 5 days. Jillian Buttery, MD  Active   MUCINEX FAST-MAX DM MAX 20-400 MG/20ML LIQD [id number]  Take 20 mLs by mouth every 6 (six) hours as needed (for cold-like synptoms).  Patient not taking: Reported on 01/08/2024   [provider]  Active Self  nitroGLYCERIN  (NITROSTAT ) 0.4 MG SL tablet [id number] Yes Place 1 tablet (0.4 mg total) under the tongue every 5 (five) minutes as needed for chest pain. Okey Vina LULLA, MD  Active Self           Med Note MARISA, NATHANEL LOISE Debar Jan 02, 2024  2:07  PM)    rosuvastatin  (CRESTOR ) 40 MG tablet [id number] Yes Take one (1) tablet by mouth (40 mg) daily.  Patient taking differently: Take 40 mg by mouth at bedtime.   Okey Vina GAILS, MD  Active Self  tamsulosin  (FLOMAX ) 0.4 MG CAPS capsule [id number] Yes Take one capsule once daily ( thirty minutes after same meal daily)  Patient taking differently: Take 0.4 mg by mouth at bedtime.   Okey Vina GAILS, MD  Active Self   TYLENOL  325 MG tablet [id number] Yes Take 325-650 mg by mouth every 6 (six) hours as needed for mild pain (pain score 1-3) (or headaches). [provider]  Active Self  vancomycin  (VANCOCIN ) 125 MG capsule [id number] Yes Take 1 capsule (125 mg total) by mouth 4 (four) times daily for 8 days. Jillian Buttery, MD  Active   Vonoprazan Fumarate  (VOQUEZNA ) 10 MG TABS [id number] Yes Take 10 mg by mouth daily.  Patient taking differently: Take 10 mg by mouth in the morning.   Mollie Nestor HERO, PA-C  Active Self            Home Care and Equipment/Supplies: Were Home Health Services Ordered?: No Any new equipment or medical supplies ordered?: No  Functional Questionnaire: Do you need assistance with bathing/showering or dressing?: No Do you need assistance with meal preparation?: No Do you need assistance with eating?: No Do you have difficulty maintaining continence: No Do you need assistance with getting out of bed/getting out of a chair/moving?: No Do you have difficulty managing or taking your medications?: No  Follow up appointments reviewed: PCP Follow-up appointment confirmed?: No (patient states he has spoken with his primary care provider office and is waiting for a call/ response back.) Specialist Hospital Follow-up appointment confirmed?: Yes Date of Specialist follow-up appointment?: 01/26/24 Follow-Up Specialty Provider:: Dr. Mollie Do you need transportation to your follow-up appointment?: No Do you understand care options if your condition(s) worsen?: Yes-patient verbalized understanding  SDOH Interventions Today    Flowsheet Row Most Recent Value  SDOH Interventions   Food Insecurity Interventions Intervention Not Indicated  Housing Interventions Intervention Not Indicated  Transportation Interventions Intervention Not Indicated  Utilities Interventions Intervention Not Indicated   Discussed and offered 30 day TOC program.  Patient declined.  The patient  has been provided with contact information for the care management team and has been advised to call with any health -related questions or concerns.  The patient verbalized understanding with current plan of care.  The patient is directed to their insurance card regarding availability of benefits coverage.    Arvin Seip RN, BSN, CCM [employer], Population Health Case Manager Phone: [phone]

## 2024-01-08 NOTE — Patient Instructions (Signed)
 Visit Information  Thank you for taking time to visit with me today. Please don't hesitate to contact me if I can be of assistance to you   Patient instructions: notify his provider of any worsening symptoms.   take antibiotics as prescribed and until completed.  Schedule follow up visit with primary care provider.  Call [phone] for severe symptoms.    Patient verbalizes understanding of instructions and care plan provided today and agrees to view in MyChart. Active MyChart status and patient understanding of how to access instructions and care plan via MyChart confirmed with patient.     The patient has been provided with contact information for the care management team and has been advised to call with any health related questions or concerns.   Please call the care guide team at [phone] if you need to cancel or reschedule your appointment.   Please call the Suicide and Crisis Lifeline: 988 call the [place]  National Suicide Prevention Lifeline: 5715286871 or TTY: 518-377-6216 TTY 502-437-9767) to talk to a trained counselor call 1-800-273-TALK (toll free, 24 hour hotline) if you are experiencing a Mental Health or Behavioral Health Crisis or need someone to talk to.  Arvin Seip RN, BSN, CCM [employer], Population Health Case Manager Phone: [phone]

## 2024-01-11 ENCOUNTER — Telehealth: Payer: Self-pay | Admitting: Nurse Practitioner

## 2024-01-11 ENCOUNTER — Telehealth: Payer: Self-pay | Admitting: Internal Medicine

## 2024-01-11 ENCOUNTER — Ambulatory Visit: Admitting: Nurse Practitioner

## 2024-01-11 VITALS — BP 118/60 | HR 86 | Temp 98.1°F | Ht 69.0 in | Wt 172.4 lb

## 2024-01-11 DIAGNOSIS — Z09 Encounter for follow-up examination after completed treatment for conditions other than malignant neoplasm: Secondary | ICD-10-CM

## 2024-01-11 DIAGNOSIS — Z23 Encounter for immunization: Secondary | ICD-10-CM | POA: Diagnosis not present

## 2024-01-11 DIAGNOSIS — A0472 Enterocolitis due to Clostridium difficile, not specified as recurrent: Secondary | ICD-10-CM

## 2024-01-11 NOTE — Patient Instructions (Signed)
 Nice to see you today  Do the stool one week after you completed the antibiotics We did update your flu vaccine today Follow up with me in 3 months, sooner if you need me

## 2024-01-11 NOTE — Telephone Encounter (Signed)
 *  STAT* If patient is at the pharmacy, call can be transferred to refill team.   1. Which medications need to be refilled? (please list name of each medication and dose if known)   amLODipine  (NORVASC ) 5 MG tablet   2. Which pharmacy/location (including street and city if local pharmacy) is medication to be sent to?  [facility] [place], [place] - [address]    3. Do they need a 30 day or 90 day supply? 30   Patient is completely out of medication

## 2024-01-11 NOTE — Telephone Encounter (Signed)
 Patient needs refill on amlodipine  5 mg

## 2024-01-11 NOTE — Telephone Encounter (Signed)
 Called patient left message on personal voice mail to call back.

## 2024-01-11 NOTE — Progress Notes (Signed)
 Established Patient Office Visit  Subjective   Patient ID: John Salazar, male    DOB: [birth year]  Age: 78 y.o. MRN: [id number]  Chief Complaint  Patient presents with   Hospitalization Follow-up    Pt complains of doing well.     HPI  Who may we know patient was seen by me on 01/01/2024 for fever confusion and some generalized abdominal pain.  Labs were obtained along with chest x-ray that showed possible atelectasis versus pneumonia patient had recently been on cefdinir  medication but also had underwent a recent procedure on 12/06/2023 for inspire device.  Patient went to the hospital 01/01/2024.  He was diagnosed with colitis and slight AKI.  Treating with IV fluids and started on broad-spectrum antibiotics.  Patient was admitted to the hospital.  Patient was discharged on 01/05/2024.  CT imaging did not support any acute findings patient tested positive for C. difficile and started on oral vancomycin  other antibiotics were discontinued and blood cultures were negative.  Prerenal AKI resolved patient is here for follow-up.  Discussed the use of AI scribe software for clinical note transcription with the patient, who gave verbal consent to proceed.  History of Present Illness John Salazar is a 78 year old male who presents for follow-up after recent hospitalization for Clostridioides difficile infection.  He was hospitalized due to confusion, abdominal pain, and diarrhea. Initial blood work was unremarkable, but an x-ray suggested possible pneumonia or atelectasis. A CT scan of the chest, abdomen, and pelvis did not reveal pneumonia. He was treated with IV antibiotics and fluids and was diagnosed with Clostridioides difficile infection. He was discharged on oral vancomycin , which he has been taking for ten days, with the last dose scheduled for tomorrow.  Since discharge on November 28th, there has been significant improvement in symptoms. The diarrhea resolved mostly by the following  Wednesday, with only minimal residual symptoms when urinating. No current abdominal pain, but some gas, which he attributes to dietary changes. His appetite is improving, and he is consuming three meals a day. He is also drinking water, coffee, and tea without difficulty.  During his hospital stay, he experienced an eye issue described as a scratchy sensation that progressed to pain. He was seen by an eye doctor who diagnosed inflammation and prescribed steroid eye drops, which have been effective. He has a follow-up appointment scheduled with the eye doctor next week.  He has a history of gastrointestinal issues, including gastritis, for which he was advised to double his pantoprazole  dose. He has a follow-up with his gastroenterologist scheduled for December 19th. He also mentions a history of fluctuating bowel habits, alternating between diarrhea and constipation.  He is currently taking amlodipine  twice daily for blood pressure management, having recently increased from once daily. He is also on isosorbide  mononitrate. He mentions a need to follow up with his cardiologist for prescription refills.  He has a history of sleep apnea and is scheduled for a follow-up with pulmonology on December 12th regarding his Inspire device. He also has an ENT follow-up in February related to the device installation.     Review of Systems  Constitutional:  Negative for chills and fever.  Respiratory:  Negative for shortness of breath.   Cardiovascular:  Negative for chest pain.  Gastrointestinal:  Positive for diarrhea (improving). Negative for abdominal pain.  Neurological:  Negative for dizziness and headaches.      Objective:     BP 118/60   Pulse 86   Temp  98.1 F (36.7 C) (Oral)   Ht 5' 9 (1.753 m)   Wt 172 lb 6.4 oz (78.2 kg)   SpO2 96%   BMI 25.46 kg/m  BP Readings from Last 3 Encounters:  01/11/24 118/60  01/05/24 (!) 143/82  01/01/24 110/60   Wt Readings from Last 3 Encounters:   01/11/24 172 lb 6.4 oz (78.2 kg)  01/02/24 168 lb 6.9 oz (76.4 kg)  01/01/24 174 lb (78.9 kg)   SpO2 Readings from Last 3 Encounters:  01/11/24 96%  01/05/24 96%  01/01/24 93%      Physical Exam Vitals and nursing note reviewed.  Constitutional:      Appearance: Normal appearance.  Cardiovascular:     Rate and Rhythm: Normal rate and regular rhythm.     Heart sounds: Normal heart sounds.  Pulmonary:     Effort: Pulmonary effort is normal.     Breath sounds: Normal breath sounds.  Abdominal:     General: Bowel sounds are normal. There is no distension.     Palpations: There is no mass.     Tenderness: There is no abdominal tenderness.     Hernia: No hernia is present.  Skin:    Findings: Bruising present.      Neurological:     Mental Status: He is alert.      No results found for any visits on 01/11/24.    The ASCVD Risk score (Arnett DK, et al., 2019) failed to calculate for the following reasons:   The valid total cholesterol range is 130 to 320 mg/dL    Assessment & Plan:   Problem List Items Addressed This Visit   None Visit Diagnoses       Hospital discharge follow-up    -  Primary     C. difficile diarrhea       Relevant Orders   C. difficile GDH and Toxin A/B     Need for influenza vaccination       Relevant Orders   Flu vaccine HIGH DOSE PF(Fluzone Trivalent) (Completed)      Assessment and Plan Assessment & Plan Clostridioides difficile colitis Diarrhea resolved with oral vancomycin . Mild residual gas and belching due to gut flora imbalance. Discussed recurrence risk. - Continue oral vancomycin  until completion. - Provided stool sample kit for C. difficile testing one week post-antibiotics. - Recommended daily yogurt with live cultures for probiotics. - Discussed over-the-counter probiotic option if he does not want to do yogurt - Reviewed, inpatient note, most recent labs and imaging.  Acute anterior uveitis Steroid eye drops  effective. Follow-up with eye doctor scheduled. - Continue steroid eye drops as prescribed. - Follow up with eye doctor next week.  Gastritis and gastroesophageal reflux disease Current treatment with pantoprazole . Discussed potential switch to Voquezna  for better control. GI follow-up scheduled. - Continue Voquenza as prescribed. - Follow up with GI specialist next week.  Hypertension Managed with amlodipine . Losartan  discontinued. Cardiologist follow-up needed. - Continue amlodipine  twice daily. - Sent message to cardiologist for follow-up.  General health maintenance Up to date on vaccines. Discussed flu and COVID vaccinations. - Administered flu vaccine today. - Discuss COVID vaccination with pharmacy.  Return in about 3 months (around 04/10/2024) for BP recheck/stomach .    Adina Crandall, NP

## 2024-01-11 NOTE — Telephone Encounter (Signed)
 Saw John Salazar today and he mentioned he was needing a refill on amlodipine . I told him I would send you a message  Thanks,  Adina BROCKS

## 2024-01-12 MED ORDER — AMLODIPINE BESYLATE 5 MG PO TABS: 5.0000 mg | ORAL_TABLET | Freq: Two times a day (BID) | ORAL | 0 refills | Status: DC

## 2024-01-12 NOTE — Telephone Encounter (Signed)
 Refill sent

## 2024-01-16 ENCOUNTER — Other Ambulatory Visit: Payer: Self-pay | Admitting: Internal Medicine

## 2024-01-17 DIAGNOSIS — H2000 Unspecified acute and subacute iridocyclitis: Secondary | ICD-10-CM | POA: Diagnosis not present

## 2024-01-19 ENCOUNTER — Ambulatory Visit: Admitting: Primary Care

## 2024-01-19 ENCOUNTER — Telehealth: Payer: Self-pay | Admitting: *Deleted

## 2024-01-19 ENCOUNTER — Encounter: Payer: Self-pay | Admitting: Primary Care

## 2024-01-19 VITALS — BP 120/64 | HR 81 | Temp 97.8°F | Ht 69.0 in | Wt 173.0 lb

## 2024-01-19 DIAGNOSIS — Z87891 Personal history of nicotine dependence: Secondary | ICD-10-CM

## 2024-01-19 DIAGNOSIS — G4733 Obstructive sleep apnea (adult) (pediatric): Secondary | ICD-10-CM

## 2024-01-19 DIAGNOSIS — Z9682 Presence of neurostimulator: Secondary | ICD-10-CM

## 2024-01-19 NOTE — Telephone Encounter (Signed)
 ATC patient x1.  Left detailed VM (DPR) to arrive by 11:15 for 11:30 appointment.  I gave specific instructions on what to download on to phone and where to go to download.  Nothing further needed.

## 2024-01-19 NOTE — Progress Notes (Signed)
 @Patient  ID: John Salazar, male    DOB: 27-Feb-1945, 78 y.o.   MRN: [id number]  Chief Complaint  Patient presents with   Obstructive Sleep Apnea    Inspire     Referring provider: Wendee Lynwood HERO, NP  HPI: 78 year old male, former smoker. PMH significant for CAD, HTN, TIA, OSA, bronchitis, barrett esophagus, CKD, depression, hyperlipidemia, DNR status. Patient of Dr. Neysa, last seen in June 2025.   01/19/2024 Discussed the use of AI scribe software for clinical note transcription with the patient, who gave verbal consent to proceed.  History of Present Illness John Salazar is a 78 year old male with severe sleep apnea who presents for follow-up after Inspire device implantation. He was referred by Dr. Neysa to ENT for [facility] consultation due to low CPAP compliance.  He has a history of severe sleep apnea diagnosed in the fall of 2024 with a sleep study showing 39 apneic events per hour. Initially, he was started on CPAP therapy but had difficulty tolerating it due to being a 'flopper' in his sleep, leading to a compliance rate of about 27%.  Due to low CPAP compliance, he was referred to ENT for consideration of the [facility] device. The Inspire hypoglossal nerve stimulator was implanted on October 29th. Post-implantation, he developed a rash, which was treated with Benadryl  and prednisone , resolving without recurrence. The rash was suspected to be related to a new medication or possibly the use of Hibiclens  soap.  He is currently undergoing activation and titration of the Inspire device. He is 'sleepy all the time' and typically sleeps from 9 PM to 6 or 7 AM, waking 3-4 times a night to use the bathroom but falls back asleep easily.  He is learning to use the remote for the [employer] device.  Inspire activation 01/19/2024 Sensation - 1.1V Functional- 1.0V (lower limit 1.0V- upper limit 2.0V) Start delay- 20 min Pause time- 15 min Duration 9 hours Electrode A Pulse width 90 Rate  33 Hz  Allergies[1]  Immunization History  Administered Date(s) Administered   Fluad Quad(high Dose 65+) 10/04/2018, 12/07/2019, 11/23/2020   INFLUENZA, HIGH DOSE SEASONAL PF 11/07/2017, 01/11/2024   Influenza-Unspecified 11/07/2022   PFIZER(Purple Top)SARS-COV-2 Vaccination 03/16/2019, 04/08/2019, 08/18/2020   Pneumococcal Conjugate-13 07/26/2013   Pneumococcal Polysaccharide-23 11/19/2009   Pneumococcal-Unspecified 03/24/2017   RSV,unspecified 11/07/2022   Tdap 11/12/2010, 09/06/2023   Zoster Recombinant(Shingrix) 09/27/2018, 12/27/2018    Past Medical History:  Diagnosis Date   Anxiety    Ascending aorta dilation 08/22/2021   Echocardiogram 04/2021: 40 mm   Barrett esophagus    BPH (benign prostatic hyperplasia)    Cerebral aneurysm    Chronic kidney disease    stage 2   Colon polyps    Coronary artery disease    mild-mod non-obstructive CAD   Depression    Erectile dysfunction    GERD (gastroesophageal reflux disease)    History of kidney stones    History of stomach ulcers    Hypercholesteremia    Hypertension    Internal carotid artery stenosis, right 06/29/2020   Positive TB test    in the past   Sleep apnea    does not use CPAP    Tobacco History: Tobacco Use History[2] Counseling given: Not Answered   Outpatient Medications Prior to Visit  Medication Sig Dispense Refill   amLODipine  (NORVASC ) 5 MG tablet Take 1 tablet (5 mg total) by mouth 2 (two) times daily. 60 tablet 0   aspirin  EC 81 MG  tablet Take 1 tablet (81 mg total) by mouth daily. Swallow whole.     escitalopram  (LEXAPRO ) 20 MG tablet Take 1 tablet (20 mg total) by mouth daily. (Patient taking differently: Take 20 mg by mouth at bedtime.) 90 tablet 3   famotidine  (PEPCID ) 20 MG tablet Take 20 mg by mouth daily as needed for heartburn or indigestion.     isosorbide  mononitrate (IMDUR ) 30 MG 24 hr tablet Take 0.5 tablets (15 mg total) by mouth daily. AS NEEDED (Patient taking differently: Take  15 mg by mouth daily as needed (for fatigue, to increase blood flow.).) 30 tablet 6   nitroGLYCERIN  (NITROSTAT ) 0.4 MG SL tablet Place 1 tablet (0.4 mg total) under the tongue every 5 (five) minutes as needed for chest pain. 25 tablet prn   rosuvastatin  (CRESTOR ) 40 MG tablet Take 1 tablet (40mg ) by mouth daily. Appointment required for further refills. 90 tablet 0   tamsulosin  (FLOMAX ) 0.4 MG CAPS capsule Take one capsule once daily ( thirty minutes after same meal daily) (Patient taking differently: Take 0.4 mg by mouth at bedtime.)     TYLENOL  325 MG tablet Take 325-650 mg by mouth every 6 (six) hours as needed for mild pain (pain score 1-3) (or headaches).     Vonoprazan Fumarate  (VOQUEZNA ) 10 MG TABS Take 10 mg by mouth daily. (Patient taking differently: Take 10 mg by mouth in the morning.) 90 tablet 1   prednisoLONE acetate (PRED FORTE) 1 % ophthalmic suspension Instill 1 drop into left eye every hour While awake today. Then 4 TIMES A DAY thereafter. (Patient not taking: Reported on 01/19/2024)     No facility-administered medications prior to visit.   Review of Systems  Review of Systems  Constitutional: Negative.   Respiratory: Negative.    Psychiatric/Behavioral: Negative.     Physical Exam  BP 120/64   Pulse 81   Temp 97.8 F (36.6 C)   Ht 5' 9 (1.753 m) Comment: pt stated  Wt 173 lb (78.5 kg)   SpO2 99% Comment: ra  BMI 25.55 kg/m  Physical Exam Constitutional:      Appearance: Normal appearance. He is well-developed.  HENT:     Head: Normocephalic and atraumatic.     Mouth/Throat:     Mouth: Mucous membranes are moist.     Pharynx: Oropharynx is clear.     Comments: Tongue protrusion midline, no weakness Cardiovascular:     Rate and Rhythm: Normal rate and regular rhythm.     Heart sounds: Normal heart sounds.  Pulmonary:     Effort: Pulmonary effort is normal. No respiratory distress.     Breath sounds: Normal breath sounds. No wheezing or rhonchi.   Musculoskeletal:        General: Normal range of motion.     Cervical back: Normal range of motion and neck supple.  Skin:    General: Skin is warm and dry.     Findings: No erythema or rash.  Neurological:     General: No focal deficit present.     Mental Status: He is alert and oriented to person, place, and time. Mental status is at baseline.  Psychiatric:        Mood and Affect: Mood normal.        Behavior: Behavior normal.        Thought Content: Thought content normal.        Judgment: Judgment normal.      Lab Results:  CBC    Component Value  Date/Time   WBC 6.8 01/05/2024 0443   RBC 3.84 (L) 01/05/2024 0443   HGB 11.4 (L) 01/05/2024 0443   HGB 13.7 11/28/2022 1130   HCT 34.3 (L) 01/05/2024 0443   HCT 42.8 11/28/2022 1130   PLT 173 01/05/2024 0443   PLT 178 11/28/2022 1130   MCV 89.3 01/05/2024 0443   MCV 94 11/28/2022 1130   MCH 29.7 01/05/2024 0443   MCHC 33.2 01/05/2024 0443   RDW 14.6 01/05/2024 0443   RDW 12.9 11/28/2022 1130   LYMPHSABS 0.6 (L) 01/01/2024 2356   MONOABS 0.7 01/01/2024 2356   EOSABS 0.0 01/01/2024 2356   BASOSABS 0.0 01/01/2024 2356    BMET    Component Value Date/Time   NA 140 01/05/2024 0443   NA 141 08/24/2023 1239   K 3.2 (L) 01/05/2024 0443   CL 106 01/05/2024 0443   CO2 24 01/05/2024 0443   GLUCOSE 98 01/05/2024 0443   BUN 8 01/05/2024 0443   BUN 16 08/24/2023 1239   CREATININE 1.18 01/05/2024 0443   CALCIUM  9.0 01/05/2024 0443   GFRNONAA >60 01/05/2024 0443   GFRAA 52 (L) 01/13/2020 1138    BNP No results found for: BNP  ProBNP No results found for: PROBNP  Imaging: CT CHEST ABDOMEN PELVIS W CONTRAST Result Date: 01/02/2024 EXAM: CT CHEST, ABDOMEN AND PELVIS WITH CONTRAST 01/02/2024 02:38:39 AM TECHNIQUE: CT of the chest, abdomen and pelvis was performed with the administration of 75 mL of iohexol  (OMNIPAQUE ) 300 MG/ML solution. Multiplanar reformatted images are provided for review. Automated exposure  control, iterative reconstruction, and/or weight based adjustment of the mA/kV was utilized to reduce the radiation dose to as low as reasonably achievable. COMPARISON: 05/04/2020 CLINICAL HISTORY: Abdominal pain and nausea and vomiting for 2 days. FINDINGS: CHEST: MEDIASTINUM AND LYMPH NODES: Heart and pericardium are unremarkable. No cardiac enlargement is noted. Coronary calcifications are seen. The central airways are clear. No large central pulmonary embolus is noted. Thoracic inlet is within normal limits. No hilar or mediastinal adenopathy is noted. The esophagus is within normal limits. LUNGS AND PLEURA: Lungs are well aerated bilaterally. No focal infiltrate Or sizable effusion is noted. Diffuse emphysematous changes are seen. No sizable parenchymal nodule is noted. No pneumothorax. ABDOMEN AND PELVIS: LIVER: The liver is within normal limits, with the exception of a few small left lobe cysts stable from the prior exam. GALLBLADDER AND BILE DUCTS: The gallbladder is within normal limits. No biliary ductal dilatation. SPLEEN: The spleen is within normal limits. PANCREAS: The pancreas is within normal limits. ADRENAL GLANDS: Adrenal glands are unremarkable. KIDNEYS, URETERS AND BLADDER: Bilateral renal cysts are seen and stable. No follow-up is recommended. No renal calculi or obstructive changes are seen. No hydronephrosis. The bladder is well distended. GI AND BOWEL: Stomach and small bowel are within normal limits. No obstructive or inflammatory changes of the colon are noted. Diverticular changes seen without evidence of diverticulitis. The appendix is not visualized, consistent with the prior surgical history. There is no bowel obstruction. REPRODUCTIVE ORGANS: The prostate is within normal limits. PERITONEUM AND RETROPERITONEUM: No ascites. No free air. VASCULATURE: Atherosclerotic calcifications of the thoracic aorta and its branches are seen. Abdominal Aortic calcifications are noted. ABDOMINAL AND  PELVIS LYMPH NODES: No lymphadenopathy. BONES AND SOFT TISSUES: Degenerative changes of the thoracic spine are seen. IMPRESSION: 1. Diverticulosis without diverticulitis. 2. No acute abnormality noted. Electronically signed by: Oneil Devonshire MD 01/02/2024 02:55 AM EST RP Workstation: [id number]   DG Chest 2 View Result Date:  01/01/2024 CLINICAL DATA:  Cough, fever, and rhonchi EXAM: CHEST - 2 VIEW COMPARISON:  Chest radiograph dated 12/06/2023 FINDINGS: Normal lung volumes. Hazy opacity projecting over the right lower lung. No pleural effusion or pneumothorax. The heart size and mediastinal contours are within normal limits. No acute osseous abnormality. Nerve stimulator generator projects over the anteromedial right chest with lead terminating above the field of view. IMPRESSION: Hazy opacity projecting over the right lower lung, which may represent atelectasis or pneumonia. Electronically Signed   By: Limin  Xu M.D.   On: 01/01/2024 13:52     Assessment & Plan:   1. OSA (obstructive sleep apnea) (Primary)   Assessment and Plan Assessment & Plan Obstructive sleep apnea managed with hypoglossal nerve stimulator Severe obstructive sleep apnea with 39 apneic events per hour. CPAP therapy was poorly tolerated due to discomfort and mask displacement. Hypoglossal nerve stimulator (Inspire) was implanted on October 29th, 2024. Initial post-operative rash resolved with Benadryl  and prednisone . No infection or implant-related reaction. Current therapy involves gradual titration of the device to optimize comfort and efficacy. Sensation level 1.1V and functional 1.0V. Device configured with a start delay of 20 minutes, therapy duration of 9 hours, and pause time of 15 minutes. Emphasis on gradual titration to avoid discomfort and ensure compliance. Discussed potential for discomfort if titration is too rapid and the importance of following the titration schedule to ensure optimal device usage. Remote  instructions given and patient was able to demonstrate use.  - Activated Inspire device at level 1. - Instructed to increase device level by one each Friday. - Scheduled follow-up appointment in 4 weeks. - Plan for in-lab titration study after 3 months of consistent use. - Educated on device operation and titration schedule. - Advised to pause device during eating or drinking to prevent choking. - Instructed to bring remote to any procedures requiring sedation.  Inspire activation 01/19/2024 Sensation - 1.1V Functional- 1.0V (lower limit 1.0V- upper limit 2.0V) Start delay- 20 min Pause time- 15 min Duration 9 hours Electrode A Pulse width 90 Rate 33 Hz  Recording duration: 32 minutes  I personally spent a total of 50 minutes in the care of the patient today including performing a medically appropriate exam/evaluation, counseling and educating, placing orders, documenting clinical information in the EHR, and independently interpreting results.   Almarie LELON Ferrari, NP 01/19/2024     [1]  Allergies Allergen Reactions   Bee Venom Anaphylaxis and Shortness Of Breath   Penicillins Swelling, Rash and Other (See Comments)    As a child   [2]  Social History Tobacco Use  Smoking Status Former   Current packs/day: 0.00   Average packs/day: 2.0 packs/day for 25.0 years (50.0 ttl pk-yrs)   Types: Cigarettes   Start date: 03/11/1971   Quit date: 03/10/1996   Years since quitting: 27.8  Smokeless Tobacco Never

## 2024-01-19 NOTE — Patient Instructions (Signed)
 Inspire device was activated today, currently on level one Every Friday increase device 1 level Use Inspire device nightly Follow-up in 4 weeks- Carren will call you to call

## 2024-01-22 ENCOUNTER — Other Ambulatory Visit: Payer: Self-pay

## 2024-01-22 DIAGNOSIS — A0472 Enterocolitis due to Clostridium difficile, not specified as recurrent: Secondary | ICD-10-CM

## 2024-01-23 LAB — C. DIFFICILE GDH AND TOXIN A/B
GDH ANTIGEN: NOT DETECTED
MICRO NUMBER:: 17356153
SPECIMEN QUALITY:: ADEQUATE
TOXIN A AND B: NOT DETECTED

## 2024-01-25 ENCOUNTER — Ambulatory Visit: Payer: Self-pay | Admitting: Nurse Practitioner

## 2024-01-25 NOTE — Progress Notes (Unsigned)
 Chief Complaint: Primary GI MD:  HPI:  *** is a  ***  who was referred to me by Wendee Lynwood HERO, NP for a complaint of *** .     Discussed the use of AI scribe software for clinical note transcription with the patient, who gave verbal consent to proceed.  History of Present Illness      PREVIOUS GI WORKUP   EGD 2016:  LA Grade A esophagitis, no evidence of Barrett's esophagus   Colonoscopy 2020:  Multiple benign rectal polyps, recommended repeat in 5 years    EGD 01/2023 Small hiatal hernia, LA grade a reflux esophagitis, mild reactive gastropathy. negative for intestinal metaplasia or H. pylori    Past Medical History:  Diagnosis Date   Anxiety    Ascending aorta dilation 08/22/2021   Echocardiogram 04/2021: 40 mm   Barrett esophagus    BPH (benign prostatic hyperplasia)    Cerebral aneurysm    Chronic kidney disease    stage 2   Colon polyps    Coronary artery disease    mild-mod non-obstructive CAD   Depression    Erectile dysfunction    GERD (gastroesophageal reflux disease)    History of kidney stones    History of stomach ulcers    Hypercholesteremia    Hypertension    Internal carotid artery stenosis, right 06/29/2020   Positive TB test    in the past   Sleep apnea    does not use CPAP    Past Surgical History:  Procedure Laterality Date   APPENDECTOMY  1970   CATARACT EXTRACTION W/ INTRAOCULAR LENS  IMPLANT, BILATERAL     COLONOSCOPY  10/2012   DRUG INDUCED ENDOSCOPY N/A 10/30/2023   Procedure: DRUG INDUCED SLEEP ENDOSCOPY;  Surgeon: Okey Burns, MD;  Location: [facility];  Service: ENT;  Laterality: N/A;   ESOPHAGOGASTRODUODENOSCOPY ENDOSCOPY  10/2012   HERNIA REPAIR     IMPLANTATION OF HYPOGLOSSAL NERVE STIMULATOR N/A 12/06/2023   Procedure: INSERTION, HYPOGLOSSAL NERVE STIMULATOR;  Surgeon: Anice Riis, DO;  Location: [facility];  Service: ENT;  Laterality: N/A;  Inspire V   IR 3D INDEPENDENT WKST  04/14/2020   IR ANGIO  INTRA EXTRACRAN SEL COM CAROTID INNOMINATE BILAT MOD SED  08/09/2017   IR ANGIO INTRA EXTRACRAN SEL COM CAROTID INNOMINATE BILAT MOD SED  11/08/2018   IR ANGIO INTRA EXTRACRAN SEL COM CAROTID INNOMINATE BILAT MOD SED  04/14/2020   IR ANGIO INTRA EXTRACRAN SEL COM CAROTID INNOMINATE UNI L MOD SED  06/29/2020   IR ANGIO INTRA EXTRACRAN SEL INTERNAL CAROTID UNI L MOD SED  05/20/2020   IR ANGIO VERTEBRAL SEL VERTEBRAL BILAT MOD SED  08/09/2017   IR ANGIO VERTEBRAL SEL VERTEBRAL BILAT MOD SED  11/08/2018   IR ANGIO VERTEBRAL SEL VERTEBRAL BILAT MOD SED  04/14/2020   IR ANGIOGRAM FOLLOW UP STUDY  05/20/2020   IR INTRAVSC STENT CERV CAROTID W/EMB-PROT MOD SED INCL ANGIO  08/23/2017   IR INTRAVSC STENT CERV CAROTID W/EMB-PROT MOD SED INCL ANGIO  06/29/2020   IR RADIOLOGIST EVAL & MGMT  05/05/2020   IR RADIOLOGIST EVAL & MGMT  06/03/2020   IR RADIOLOGIST EVAL & MGMT  07/15/2020   IR TRANSCATH/EMBOLIZ  05/18/2020   IR US  GUIDE VASC ACCESS RIGHT  04/14/2020   IR US  GUIDE VASC ACCESS RIGHT  05/20/2020   IR US  GUIDE VASC ACCESS RIGHT  06/29/2020   LEFT HEART CATH AND CORONARY ANGIOGRAPHY N/A 11/15/2018   Procedure: LEFT HEART  CATH AND CORONARY ANGIOGRAPHY;  Surgeon: Mady Bruckner, MD;  Location: [facility];  Service: Cardiovascular;  Laterality: N/A;   PROSTATE SURGERY  1999   20 years ago, enlarged prostate   RADIOLOGY WITH ANESTHESIA N/A 08/23/2017   Procedure: IR WITH ANESTHESIA STENT PLACEMENT;  Surgeon: Dolphus Carrion, MD;  Location: [facility];  Service: Radiology;  Laterality: N/A;   RADIOLOGY WITH ANESTHESIA N/A 05/18/2020   Procedure: IR WITH ANESTHESIA EMBOLIZATION;  Surgeon: Dolphus Carrion, MD;  Location: [facility];  Service: Radiology;  Laterality: N/A;   RADIOLOGY WITH ANESTHESIA Left 06/29/2020   Procedure: RADIOLOGY WITH ANESTHESIA  LEFT CAROTID STENT PLACEMENT;  Surgeon: Dolphus Carrion, MD;  Location: [facility];  Service: Radiology;  Laterality: Left;    Current Outpatient Medications  Medication Sig  Dispense Refill   amLODipine  (NORVASC ) 5 MG tablet Take 1 tablet (5 mg total) by mouth 2 (two) times daily. 60 tablet 0   aspirin  EC 81 MG tablet Take 1 tablet (81 mg total) by mouth daily. Swallow whole.     escitalopram  (LEXAPRO ) 20 MG tablet Take 1 tablet (20 mg total) by mouth daily. (Patient taking differently: Take 20 mg by mouth at bedtime.) 90 tablet 3   famotidine  (PEPCID ) 20 MG tablet Take 20 mg by mouth daily as needed for heartburn or indigestion.     isosorbide  mononitrate (IMDUR ) 30 MG 24 hr tablet Take 0.5 tablets (15 mg total) by mouth daily. AS NEEDED (Patient taking differently: Take 15 mg by mouth daily as needed (for fatigue, to increase blood flow.).) 30 tablet 6   nitroGLYCERIN  (NITROSTAT ) 0.4 MG SL tablet Place 1 tablet (0.4 mg total) under the tongue every 5 (five) minutes as needed for chest pain. 25 tablet prn   prednisoLONE acetate (PRED FORTE) 1 % ophthalmic suspension Instill 1 drop into left eye every hour While awake today. Then 4 TIMES A DAY thereafter. (Patient not taking: Reported on 01/19/2024)     rosuvastatin  (CRESTOR ) 40 MG tablet Take 1 tablet (40mg ) by mouth daily. Appointment required for further refills. 90 tablet 0   tamsulosin  (FLOMAX ) 0.4 MG CAPS capsule Take one capsule once daily ( thirty minutes after same meal daily) (Patient taking differently: Take 0.4 mg by mouth at bedtime.)     TYLENOL  325 MG tablet Take 325-650 mg by mouth every 6 (six) hours as needed for mild pain (pain score 1-3) (or headaches).     Vonoprazan Fumarate  (VOQUEZNA ) 10 MG TABS Take 10 mg by mouth daily. (Patient taking differently: Take 10 mg by mouth in the morning.) 90 tablet 1   No current facility-administered medications for this visit.    Allergies as of 01/26/2024 - Review Complete 01/19/2024  Allergen Reaction Noted   Bee venom Anaphylaxis and Shortness Of Breath 04/18/2017   Penicillins Swelling, Rash, and Other (See Comments) 03/10/2014    Family History  Problem  Relation Age of Onset   Stroke Mother 28   Aneurysm Father 53   Colon cancer Neg Hx    Esophageal cancer Neg Hx     Social History   Socioeconomic History   Marital status: Married    Spouse name: pamela   Number of children: 2   Years of education: 12   Highest education level: GED or equivalent  Occupational History   Occupation: comptroller.  Tobacco Use   Smoking status: Former    Current packs/day: 0.00    Average packs/day: 2.0 packs/day for 25.0 years (50.0 ttl pk-yrs)  Types: Cigarettes    Start date: 03/11/1971    Quit date: 03/10/1996    Years since quitting: 27.8   Smokeless tobacco: Never  Vaping Use   Vaping status: Never Used  Substance and Sexual Activity   Alcohol  use: No    Alcohol /week: 0.0 standard drinks of alcohol     Comment: in the past drank, quit 1993   Drug use: No   Sexual activity: Not Currently  Other Topics Concern   Not on file  Social History Narrative   Lives with Sharlet (Wife) - together for 28 years   Children - Randine and Mliss -- one is a engineer, civil (consulting) in [facility] and other works for [employer] - 17, lives in [place]   Enjoys - playing golf, goes to [employer] and sponsors people as well, church   Support - family, good friends in [place]   Exercise - walks the dog, runs up hills   Diet - improved from before, tries to drink water - low salt or no salt      Retail buyer for [employer]    Social Drivers of Health   Tobacco Use: Medium Risk (01/19/2024)   Patient History    Smoking Tobacco Use: Former    Smokeless Tobacco Use: Never    Passive Exposure: Not on Actuary Strain: Low Risk (08/22/2023)   Overall Financial Resource Strain (CARDIA)    Difficulty of Paying Living Expenses: Not hard at all  Food Insecurity: No Food Insecurity (01/08/2024)   Epic    Worried About Radiation Protection Practitioner of Food in the Last Year: Never true    Ran Out of Food in the Last Year: Never true  Transportation Needs: No  Transportation Needs (01/08/2024)   Epic    Lack of Transportation (Medical): No    Lack of Transportation (Non-Medical): No  Physical Activity: Insufficiently Active (08/22/2023)   Exercise Vital Sign    Days of Exercise per Week: 4 days    Minutes of Exercise per Session: 30 min  Stress: No Stress Concern Present (08/22/2023)   [employer] of Occupational Health - Occupational Stress Questionnaire    Feeling of Stress: Not at all  Social Connections: Socially Integrated (01/02/2024)   Social Connection and Isolation Panel    Frequency of Communication with Friends and Family: More than three times a week    Frequency of Social Gatherings with Friends and Family: More than three times a week    Attends Religious Services: More than 4 times per year    Active Member of Clubs or Organizations: Yes    Attends Banker Meetings: More than 4 times per year    Marital Status: Married  Catering Manager Violence: Not At Risk (01/08/2024)   Epic    Fear of Current or Ex-Partner: No    Emotionally Abused: No    Physically Abused: No    Sexually Abused: No  Depression (PHQ2-9): Low Risk (01/11/2024)   Depression (PHQ2-9)    PHQ-2 Score: 0  Recent Concern: Depression (PHQ2-9) - High Risk (10/23/2023)   Depression (PHQ2-9)    PHQ-2 Score: 11  Alcohol  Screen: Low Risk (05/18/2023)   Alcohol  Screen    Last Alcohol  Screening Score (AUDIT): 0  Housing: Unknown (01/08/2024)   Epic    Unable to Pay for Housing in the Last Year: No    Number of Times Moved in the Last Year: Not on file    Homeless in the Last  Year: No  Utilities: Not At Risk (01/08/2024)   Epic    Threatened with loss of utilities: No  Health Literacy: Adequate Health Literacy (05/18/2023)   B1300 Health Literacy    Frequency of need for help with medical instructions: Never    Review of Systems:    Constitutional: No weight loss, fever, chills, weakness or fatigue HEENT: Eyes: No change in vision                Ears, Nose, Throat:  No change in hearing or congestion Skin: No rash or itching Cardiovascular: No chest pain, chest pressure or palpitations   Respiratory: No SOB or cough Gastrointestinal: See HPI and otherwise negative Genitourinary: No dysuria or change in urinary frequency Neurological: No headache, dizziness or syncope Musculoskeletal: No new muscle or joint pain Hematologic: No bleeding or bruising Psychiatric: No history of depression or anxiety    Physical Exam:  Vital signs: There were no vitals taken for this visit.  Constitutional: NAD, alert and cooperative Head:  Normocephalic and atraumatic. Eyes:   PEERL, EOMI. No icterus. Conjunctiva pink. Respiratory: Respirations even and unlabored. Lungs clear to auscultation bilaterally.   No wheezes, crackles, or rhonchi.  Cardiovascular:  Regular rate and rhythm. No peripheral edema, cyanosis or pallor.  Gastrointestinal:  Soft, nondistended, nontender. No rebound or guarding. Normal bowel sounds. No appreciable masses or hepatomegaly. Rectal:  Declines Msk:  Symmetrical without gross deformities. Without edema, no deformity or joint abnormality.  Neurologic:  Alert and  oriented x4;  grossly normal neurologically.  Skin:   Dry and intact without significant lesions or rashes. Psychiatric: Oriented to person, place and time. Demonstrates good judgement and reason without abnormal affect or behaviors.  Physical Exam    RELEVANT LABS AND IMAGING: CBC    Component Value Date/Time   WBC 6.8 01/05/2024 0443   RBC 3.84 (L) 01/05/2024 0443   HGB 11.4 (L) 01/05/2024 0443   HGB 13.7 11/28/2022 1130   HCT 34.3 (L) 01/05/2024 0443   HCT 42.8 11/28/2022 1130   PLT 173 01/05/2024 0443   PLT 178 11/28/2022 1130   MCV 89.3 01/05/2024 0443   MCV 94 11/28/2022 1130   MCH 29.7 01/05/2024 0443   MCHC 33.2 01/05/2024 0443   RDW 14.6 01/05/2024 0443   RDW 12.9 11/28/2022 1130   LYMPHSABS 0.6 (L) 01/01/2024 2356   MONOABS 0.7  01/01/2024 2356   EOSABS 0.0 01/01/2024 2356   BASOSABS 0.0 01/01/2024 2356    CMP     Component Value Date/Time   NA 140 01/05/2024 0443   NA 141 08/24/2023 1239   K 3.2 (L) 01/05/2024 0443   CL 106 01/05/2024 0443   CO2 24 01/05/2024 0443   GLUCOSE 98 01/05/2024 0443   BUN 8 01/05/2024 0443   BUN 16 08/24/2023 1239   CREATININE 1.18 01/05/2024 0443   CALCIUM  9.0 01/05/2024 0443   PROT 7.5 01/01/2024 2356   ALBUMIN  4.1 01/01/2024 2356   AST 21 01/01/2024 2356   ALT 25 01/01/2024 2356   ALKPHOS 91 01/01/2024 2356   BILITOT 0.8 01/01/2024 2356   GFRNONAA >60 01/05/2024 0443   GFRAA 52 (L) 01/13/2020 1138     Assessment/Plan:   IBS-D Chronic bloating, gas, and diarrhea. Previously given rifaximin  and low FODMAP diet.  Was doing well at last follow-up visit.  Recent admission with diarrhea, fever, vomiting, abdominal discomfort with normal CT chest abdomen pelvis.  C. difficile antigen positive, toxin negative but due to symptoms patient was  started on oral vancomycin .  Repeat stool study 01/22/2024 with negative GDH antigen and toxin.  - Doing well on low FODMAP diet   GERD On protonix  40mg  twice daily. EGD 01/2023 with LA grade A reflux esophagitis, small hiatal hernia, biopsies with mild reactive gastropathy negative for h pylori.  Persistent symptoms despite PPI twice daily and famotidine  at bedtime.  Symptoms described as reflux, burping, burning - Trial of Voquenza 10 mg once daily (samples provided) - Educated patient on lifestyle modifications and provided patient education handout - Stop pantoprazole  twice daily and famotidine  - Follow-up with me in 6 to 8 weeks   History of colon polyps colonoscopy in 2014 at which time a hyperplastic (benign) polyp was removed.  another colonoscopy in 2020 in which several rectal polyps were removed, one of these was a tubular adenoma. no family history of colon cancer.  With recent colonoscopy and multiple comorbidities in addition  to age no repeat recommended - No follow-up colonoscopy recommended for screening purposes   CAD Cath in 2020 ECHO with EF 60-65% 08/2023   History of bilateral carotid artery stents On ASA and plavix    L ICA aneurysm s/p embolization    Krimson Massmann Mollie RIGGERS [facility] 01/25/2024, 1:29 PM  Cc: Wendee Lynwood HERO, NP

## 2024-01-26 ENCOUNTER — Ambulatory Visit: Admitting: Gastroenterology

## 2024-01-26 ENCOUNTER — Encounter: Payer: Self-pay | Admitting: Gastroenterology

## 2024-01-26 VITALS — BP 138/84 | HR 84 | Wt 177.0 lb

## 2024-01-26 DIAGNOSIS — Z7902 Long term (current) use of antithrombotics/antiplatelets: Secondary | ICD-10-CM | POA: Diagnosis not present

## 2024-01-26 DIAGNOSIS — K580 Irritable bowel syndrome with diarrhea: Secondary | ICD-10-CM

## 2024-01-26 DIAGNOSIS — A0472 Enterocolitis due to Clostridium difficile, not specified as recurrent: Secondary | ICD-10-CM | POA: Diagnosis not present

## 2024-01-26 DIAGNOSIS — A498 Other bacterial infections of unspecified site: Secondary | ICD-10-CM

## 2024-01-26 DIAGNOSIS — Z95828 Presence of other vascular implants and grafts: Secondary | ICD-10-CM

## 2024-01-26 DIAGNOSIS — K2970 Gastritis, unspecified, without bleeding: Secondary | ICD-10-CM

## 2024-01-26 DIAGNOSIS — Z860100 Personal history of colon polyps, unspecified: Secondary | ICD-10-CM | POA: Diagnosis not present

## 2024-01-26 DIAGNOSIS — B9689 Other specified bacterial agents as the cause of diseases classified elsewhere: Secondary | ICD-10-CM | POA: Diagnosis not present

## 2024-01-26 DIAGNOSIS — I6523 Occlusion and stenosis of bilateral carotid arteries: Secondary | ICD-10-CM

## 2024-01-26 DIAGNOSIS — R1013 Epigastric pain: Secondary | ICD-10-CM

## 2024-01-26 DIAGNOSIS — K2100 Gastro-esophageal reflux disease with esophagitis, without bleeding: Secondary | ICD-10-CM | POA: Diagnosis not present

## 2024-01-26 DIAGNOSIS — K259 Gastric ulcer, unspecified as acute or chronic, without hemorrhage or perforation: Secondary | ICD-10-CM

## 2024-01-26 MED ORDER — VOQUEZNA 10 MG PO TABS: 10.0000 mg | ORAL_TABLET | Freq: Every day | ORAL | 3 refills | Status: AC

## 2024-01-26 NOTE — Patient Instructions (Addendum)
 A high fiber diet with plenty of fluids (up to 8 glasses of water daily) is suggested to relieve these symptoms. Benefiber, 1 tablespoon once or twice daily can be used to keep bowels regular if needed.   Thank you for trusting me with your gastrointestinal care!   Bayley McMichael, PA-C  _______________________________________________________  If your blood pressure at your visit was 140/90 or greater, please contact your primary care physician to follow up on this.  _______________________________________________________  If you are age 42 or older, your body mass index should be between 23-30. Your Body mass index is 26.14 kg/m. If this is out of the aforementioned range listed, please consider follow up with your Primary Care Provider.  If you are age 47 or younger, your body mass index should be between 19-25. Your Body mass index is 26.14 kg/m. If this is out of the aformentioned range listed, please consider follow up with your Primary Care Provider.   ________________________________________________________  The [facility] providers would like to encourage you to use MYCHART to communicate with providers for non-urgent requests or questions.  Due to long hold times on the telephone, sending your provider a message by [facility] may be a faster and more efficient way to get a response.  Please allow 48 business hours for a response.  Please remember that this is for non-urgent requests.  _______________________________________________________  [facility] is using a team-based approach to care.  Your team is made up of your doctor and two to three APPS. Our APPS (Nurse Practitioners and Physician Assistants) work with your physician to ensure care continuity for you. They are fully qualified to address your health concerns and develop a treatment plan. They communicate directly with your gastroenterologist to care for you. Seeing the Advanced Practice Practitioners on your  physician's team can help you by facilitating care more promptly, often allowing for earlier appointments, access to diagnostic testing, procedures, and other specialty referrals.

## 2024-01-29 ENCOUNTER — Other Ambulatory Visit (HOSPITAL_COMMUNITY): Payer: Self-pay

## 2024-01-30 NOTE — Progress Notes (Signed)
 Agree with the assessment and plan as outlined by Boone Master, PA-C.

## 2024-02-15 ENCOUNTER — Other Ambulatory Visit: Payer: Self-pay | Admitting: Internal Medicine

## 2024-02-19 ENCOUNTER — Other Ambulatory Visit: Payer: Self-pay

## 2024-02-19 ENCOUNTER — Encounter: Payer: Self-pay | Admitting: Surgical

## 2024-02-19 ENCOUNTER — Ambulatory Visit: Admitting: Surgical

## 2024-02-19 DIAGNOSIS — M25532 Pain in left wrist: Secondary | ICD-10-CM

## 2024-02-19 DIAGNOSIS — M654 Radial styloid tenosynovitis [de Quervain]: Secondary | ICD-10-CM | POA: Diagnosis not present

## 2024-02-19 NOTE — Progress Notes (Signed)
 "  Office Visit Note   Patient: John Salazar           Date of Birth: [birth year]           MRN: [id number] Visit Date: 02/19/2024 Requested by: Wendee Lynwood HERO, NP [address] [place],  [place] [address] PCP: Wendee Lynwood HERO, NP  Subjective: Chief Complaint  Patient presents with   Left Wrist - Pain    HPI: John Salazar is a 79 y.o. male who presents to the office reporting left wrist pain.  Patient localizes pain to the radial aspect of the wrist.  States that pain is similar to presentation at last office visit about 7 months ago.  At that time had injection for de Quervain's tenosynovitis and this provided 100% relief of his symptoms.  He states that pain has returned within the last month and is worse than it was before.  He denies any fevers or chills or redness.  Pain is worse with movement of his thumb or gripping/twisting movements.  He did have recent hospitalization with colitis and diagnosis of C. difficile which has now resolved.  Had various antibiotics during that time..  Denies any injury to the wrist..                ROS: All systems reviewed are negative as they relate to the chief complaint within the history of present illness.  Patient denies fevers or chills.  Assessment & Plan: Visit Diagnoses:  1. De Quervain's disease (radial styloid tenosynovitis)   2. Pain in left wrist     Plan: Impression is 79 year old male who is here for left wrist pain similar to previous visit consistent with de Quervain's tenosynovitis.  We discussed options available to patient.  He would like to try repeat injection.  Due to the various antibiotics used, we will start with Toradol  and if no improvement from that, try cortisone afterward.  Does not appear that fluoroquinolone antibiotic was used during his hospitalization but we will start with Toradol  just in case I missed a dose of fluoroquinolone in order to limit the risk of tendon rupture.  Patient will call back if he has  continued pain.  Follow-Up Instructions: No follow-ups on file.   Orders:  Orders Placed This Encounter  Procedures   XR Wrist Complete Left   US  Guided Needle Placement - No Linked Charges   No orders of the defined types were placed in this encounter.     Procedures: Hand/UE Inj: L extensor compartment 1 for de Quervain's tenosynovitis on 02/19/2024 5:30 PM Indications: therapeutic Details: 25 G needle, ultrasound-guided dorsal approach Medications: 0.5 mL bupivacaine  0.25 % Outcome: tolerated well, no immediate complications  Half cc of Marcaine  mixed with 0.5 cc (15 mg) of Toradol  Procedure, treatment alternatives, risks and benefits explained, specific risks discussed. Consent was given by the patient. Immediately prior to procedure a time out was called to verify the correct patient, procedure, equipment, support staff and site/side marked as required. Patient was prepped and draped in the usual sterile fashion.       Clinical Data: No additional findings.  Objective: Vital Signs: There were no vitals taken for this visit.  Physical Exam:  Constitutional: Patient appears well-developed HEENT:  Head: Normocephalic Eyes:EOM are normal Neck: Normal range of motion Cardiovascular: Normal rate Pulmonary/chest: Effort normal Neurologic: Patient is alert Skin: Skin is warm Psychiatric: Patient has normal mood and affect  Ortho Exam: Ortho exam demonstrates intact EPL, FPL,  finger abduction, grip strength testing.  He has tenderness over the first dorsal compartment just distal to the radial styloid.  Palpable radial pulse rated 2+.  He has positive Finkelstein's test with reproduction of pain.  He does have pain with resisted EPL.  No tenderness throughout the rest of the wrist.  No cellulitis or skin changes noted.  Specialty Comments:  No specialty comments available.  Imaging: No results found.   PMFS History: Patient Active Problem List   Diagnosis Date  Noted   Sepsis (HCC) 01/02/2024   Acute kidney injury superimposed on chronic kidney disease 01/02/2024   Depression 01/02/2024   Intolerance of continuous positive airway pressure (CPAP) ventilation 10/30/2023   Former tobacco use 08/25/2023   TIA (transient ischemic attack) 08/14/2023   Acute cough 06/27/2023   Bronchitis 06/27/2023   Sudden idiopathic hearing loss 06/27/2023   Short-term memory loss 03/15/2023   Gastroesophageal reflux disease with esophagitis without hemorrhage 03/15/2023   Gastropathy 03/15/2023   Trigger index finger of left hand 03/15/2023   OSA (obstructive sleep apnea) 11/14/2022   Depression, major, single episode, moderate (HCC) 06/10/2022   Hx of long term use of blood thinners 06/10/2022   Vitamin B12 deficiency 05/23/2022   Preventative health care 05/23/2022   Coronary artery disease involving native coronary artery of native heart with angina pectoris 08/22/2021   Carotid artery disease 08/22/2021   Ascending aorta dilation 08/22/2021   Incomplete RBBB 08/05/2021   Diarrhea 11/23/2020   Brain aneurysm 05/18/2020   Snoring 02/27/2019   Accelerating angina (HCC) 11/15/2018   DNR (do not resuscitate) 08/23/2018   Hx of cataract surgery 08/23/2018   History of right common carotid artery stent placement 06/18/2018   Prediabetes 03/19/2018   Pain in right knee 03/05/2018   CKD (chronic kidney disease) stage 3, GFR 30-59 ml/min (HCC) 02/05/2018   Barrett esophagus 01/08/2018   Benign essential HTN 01/08/2018   BPH (benign prostatic hyperplasia) 01/08/2018   Hyperlipidemia LDL goal <70 01/08/2018   Anemia 01/08/2018   Imbalance 06/26/2017   Past Medical History:  Diagnosis Date   Anxiety    Ascending aorta dilation 08/22/2021   Echocardiogram 04/2021: 40 mm   Barrett esophagus    BPH (benign prostatic hyperplasia)    Cerebral aneurysm    Chronic kidney disease    stage 2   Colon polyps    Coronary artery disease    mild-mod non-obstructive  CAD   Depression    Erectile dysfunction    GERD (gastroesophageal reflux disease)    History of kidney stones    History of stomach ulcers    Hypercholesteremia    Hypertension    Internal carotid artery stenosis, right 06/29/2020   Positive TB test    in the past   Sleep apnea    does not use CPAP    Family History  Problem Relation Age of Onset   Stroke Mother 54   Aneurysm Father 52   Colon cancer Neg Hx    Esophageal cancer Neg Hx     Past Surgical History:  Procedure Laterality Date   APPENDECTOMY  1970   CATARACT EXTRACTION W/ INTRAOCULAR LENS  IMPLANT, BILATERAL     COLONOSCOPY  10/2012   DRUG INDUCED ENDOSCOPY N/A 10/30/2023   Procedure: DRUG INDUCED SLEEP ENDOSCOPY;  Surgeon: Okey Burns, MD;  Location: [facility];  Service: ENT;  Laterality: N/A;   ESOPHAGOGASTRODUODENOSCOPY ENDOSCOPY  10/2012   HERNIA REPAIR     IMPLANTATION OF HYPOGLOSSAL  NERVE STIMULATOR N/A 12/06/2023   Procedure: INSERTION, HYPOGLOSSAL NERVE STIMULATOR;  Surgeon: Anice Riis, DO;  Location: [facility];  Service: ENT;  Laterality: N/A;  Inspire V   IR 3D INDEPENDENT WKST  04/14/2020   IR ANGIO INTRA EXTRACRAN SEL COM CAROTID INNOMINATE BILAT MOD SED  08/09/2017   IR ANGIO INTRA EXTRACRAN SEL COM CAROTID INNOMINATE BILAT MOD SED  11/08/2018   IR ANGIO INTRA EXTRACRAN SEL COM CAROTID INNOMINATE BILAT MOD SED  04/14/2020   IR ANGIO INTRA EXTRACRAN SEL COM CAROTID INNOMINATE UNI L MOD SED  06/29/2020   IR ANGIO INTRA EXTRACRAN SEL INTERNAL CAROTID UNI L MOD SED  05/20/2020   IR ANGIO VERTEBRAL SEL VERTEBRAL BILAT MOD SED  08/09/2017   IR ANGIO VERTEBRAL SEL VERTEBRAL BILAT MOD SED  11/08/2018   IR ANGIO VERTEBRAL SEL VERTEBRAL BILAT MOD SED  04/14/2020   IR ANGIOGRAM FOLLOW UP STUDY  05/20/2020   IR INTRAVSC STENT CERV CAROTID W/EMB-PROT MOD SED INCL ANGIO  08/23/2017   IR INTRAVSC STENT CERV CAROTID W/EMB-PROT MOD SED INCL ANGIO  06/29/2020   IR RADIOLOGIST EVAL & MGMT  05/05/2020   IR  RADIOLOGIST EVAL & MGMT  06/03/2020   IR RADIOLOGIST EVAL & MGMT  07/15/2020   IR TRANSCATH/EMBOLIZ  05/18/2020   IR US  GUIDE VASC ACCESS RIGHT  04/14/2020   IR US  GUIDE VASC ACCESS RIGHT  05/20/2020   IR US  GUIDE VASC ACCESS RIGHT  06/29/2020   LEFT HEART CATH AND CORONARY ANGIOGRAPHY N/A 11/15/2018   Procedure: LEFT HEART CATH AND CORONARY ANGIOGRAPHY;  Surgeon: Mady Bruckner, MD;  Location: [facility];  Service: Cardiovascular;  Laterality: N/A;   PROSTATE SURGERY  1999   20 years ago, enlarged prostate   RADIOLOGY WITH ANESTHESIA N/A 08/23/2017   Procedure: IR WITH ANESTHESIA STENT PLACEMENT;  Surgeon: Dolphus Carrion, MD;  Location: [facility];  Service: Radiology;  Laterality: N/A;   RADIOLOGY WITH ANESTHESIA N/A 05/18/2020   Procedure: IR WITH ANESTHESIA EMBOLIZATION;  Surgeon: Dolphus Carrion, MD;  Location: [facility];  Service: Radiology;  Laterality: N/A;   RADIOLOGY WITH ANESTHESIA Left 06/29/2020   Procedure: RADIOLOGY WITH ANESTHESIA  LEFT CAROTID STENT PLACEMENT;  Surgeon: Dolphus Carrion, MD;  Location: [facility];  Service: Radiology;  Laterality: Left;   Social History   Occupational History   Occupation: comptroller.  Tobacco Use   Smoking status: Former    Current packs/day: 0.00    Average packs/day: 2.0 packs/day for 25.0 years (50.0 ttl pk-yrs)    Types: Cigarettes    Start date: 03/11/1971    Quit date: 03/10/1996    Years since quitting: 27.9   Smokeless tobacco: Never  Vaping Use   Vaping status: Never Used  Substance and Sexual Activity   Alcohol  use: No    Alcohol /week: 0.0 standard drinks of alcohol     Comment: in the past drank, quit 1993   Drug use: No   Sexual activity: Not Currently        "

## 2024-02-22 ENCOUNTER — Telehealth: Payer: Self-pay | Admitting: *Deleted

## 2024-02-22 NOTE — Telephone Encounter (Signed)
 Copied from CRM [id number]. Topic: Clinical - Order For Equipment >> Feb 20, 2024 11:07 AM John Salazar wrote: Reason for CRM: pt calling about his inspire device malfunctioning and has already spoke customer service and advised him this provider  -----------------------------------------------------------------------------------------------------------------------------------  I called the patient, he sees [name] NP.  He said he had problems with stage 5, he could not sleep.  He just turned it off.  I advised him that if he is not comfortable to go back a level for several days to a week and then if he can go back up a level go back up, but if he cannot, stay on the level he was comfortable on.  He was under the assumption that he had to get to level 11.  I let him know that what level he gets to is individual and everyone's level is different and not everyone gets to level 11, he wants to get to the level that is most comfortable for him and controls his sleep apnea.  He verbalized understanding.  Nothing further needed.

## 2024-02-23 ENCOUNTER — Encounter: Payer: Self-pay | Admitting: Primary Care

## 2024-02-23 ENCOUNTER — Ambulatory Visit (INDEPENDENT_AMBULATORY_CARE_PROVIDER_SITE_OTHER): Admitting: Primary Care

## 2024-02-23 VITALS — BP 126/70 | HR 76 | Temp 97.5°F | Ht 69.0 in | Wt 175.6 lb

## 2024-02-23 DIAGNOSIS — G4733 Obstructive sleep apnea (adult) (pediatric): Secondary | ICD-10-CM

## 2024-02-23 DIAGNOSIS — Z87891 Personal history of nicotine dependence: Secondary | ICD-10-CM | POA: Diagnosis not present

## 2024-02-23 DIAGNOSIS — R682 Dry mouth, unspecified: Secondary | ICD-10-CM

## 2024-02-23 DIAGNOSIS — Z9682 Presence of neurostimulator: Secondary | ICD-10-CM | POA: Diagnosis not present

## 2024-02-23 MED ORDER — BUPIVACAINE HCL 0.25 % IJ SOLN
0.5000 mL | INTRAMUSCULAR | Status: AC | PRN
  Administered 2024-02-19: .5 mL

## 2024-02-23 NOTE — Patient Instructions (Addendum)
" °  VISIT SUMMARY: During your follow-up appointment, we discussed your current usage of the Inspire device for managing obstructive sleep apnea. You mentioned experiencing dry mouth upon waking, which is likely due to mouth breathing during sleep. We reviewed your current device settings and usage patterns, and made some adjustments to improve your comfort and effectiveness of the therapy.  YOUR PLAN: -OBSTRUCTIVE SLEEP APNEA: Obstructive sleep apnea is a condition where the airway becomes blocked during sleep, causing breathing pauses. You are managing this with the [facility] device. We have increased the start delay and pause time to 30 minutes each. You will continue on level 4 for one more week, then increase to level 5. If level 5 is uncomfortable, return to level 4. If level 5 is tolerated, stay on it for two weeks before increasing to level 6. Plan for follow-up in 4-6 weeks and if doing well we will consider ordering titration study to determine the optimal settings for your therapy.  -DRY MOUTH RELATED TO SLEEP APNEA THERAPY: Dry mouth upon waking is likely due to mouth breathing during sleep. To manage this, we recommend using Therabreath mouthwash and zylo melts before bed to help moisturize your mouth.  Follow-up Patient needs 4-6 weeks Inspire follow-up with Landry NP- only (coordinate with bailey if needed)   "

## 2024-02-23 NOTE — Progress Notes (Signed)
 "  @Patient  ID: John Salazar, male    DOB: 1945-06-03, 79 y.o.   MRN: [id number]  No chief complaint on file.   Referring provider: Wendee Lynwood HERO, NP  HPI: 79 year old male, former smoker. PMH significant for CAD, HTN, TIA, OSA, bronchitis, barrett esophagus, CKD, depression, hyperlipidemia, DNR status. Patient of Dr. Neysa, last seen in June 2025.   Previous [facility] pulmonary encounter:  01/19/2024 Discussed the use of AI scribe software for clinical note transcription with the patient, who gave verbal consent to proceed.  History of Present Illness John Salazar is a 79 year old male with severe sleep apnea who presents for follow-up after Inspire device implantation. He was referred by Dr. Neysa to ENT for [facility] consultation due to low CPAP compliance.  He has a history of severe sleep apnea diagnosed in the fall of 2024 with a sleep study showing 39 apneic events per hour. Initially, he was started on CPAP therapy but had difficulty tolerating it due to being a 'flopper' in his sleep, leading to a compliance rate of about 27%.  Due to low CPAP compliance, he was referred to ENT for consideration of the [facility] device. The Inspire hypoglossal nerve stimulator was implanted on October 29th. Post-implantation, he developed a rash, which was treated with Benadryl  and prednisone , resolving without recurrence. The rash was suspected to be related to a new medication or possibly the use of Hibiclens  soap.  He is currently undergoing activation and titration of the Inspire device. He is 'sleepy all the time' and typically sleeps from 9 PM to 6 or 7 AM, waking 3-4 times a night to use the bathroom but falls back asleep easily.  He is learning to use the remote for the [place] device.  Inspire activation 01/19/2024 Sensation - 1.1V Functional- 1.0V (lower limit 1.0V- upper limit 2.0V) Start delay- 20 min Pause time- 15 min Duration 9 hours Electrode A Pulse width 90 Rate 33  Hz      02/23/2024- Interim hx   Discussed the use of AI scribe software for clinical note transcription with the patient, who gave verbal consent to proceed.  History of Present Illness John Salazar is a 79 year old male who presents for follow-up regarding his Inspire device usage.  He experiences dry mouth upon waking, which he attributes to mouth breathing during sleep. He uses the [facility] device with a start delay of 20 minutes and a pause time of 15 minutes, but wishes to increase these to 30 minutes each. He typically goes to bed between 9 and 10 PM and wakes up around 5 AM, estimating about 7 hours of sleep due to waking up a couple of times to use the bathroom. He is currently on level four of the device and uses it 97% of the time for more than four hours per night.  He has not noticed any improvement in snoring, as he does not share a room and was previously unaware of his snoring. He does not wake up gasping or choking. He describes the sensation of the device as a pulse that pushes his tongue, which he tries to control by biting it, but he cannot.      Allergies[1]  Immunization History  Administered Date(s) Administered   Fluad Quad(high Dose 65+) 10/04/2018, 12/07/2019, 11/23/2020   INFLUENZA, HIGH DOSE SEASONAL PF 11/07/2017, 01/11/2024   Influenza-Unspecified 11/07/2022   PFIZER(Purple Top)SARS-COV-2 Vaccination 03/16/2019, 04/08/2019, 08/18/2020   Pneumococcal Conjugate-13 07/26/2013   Pneumococcal Polysaccharide-23 11/19/2009  Pneumococcal-Unspecified 03/24/2017   RSV,unspecified 11/07/2022   Tdap 11/12/2010, 09/06/2023   Zoster Recombinant(Shingrix) 09/27/2018, 12/27/2018    Past Medical History:  Diagnosis Date   Anxiety    Ascending aorta dilation 08/22/2021   Echocardiogram 04/2021: 40 mm   Barrett esophagus    BPH (benign prostatic hyperplasia)    Cerebral aneurysm    Chronic kidney disease    stage 2   Colon polyps    Coronary artery disease     mild-mod non-obstructive CAD   Depression    Erectile dysfunction    GERD (gastroesophageal reflux disease)    History of kidney stones    History of stomach ulcers    Hypercholesteremia    Hypertension    Internal carotid artery stenosis, right 06/29/2020   Positive TB test    in the past   Sleep apnea    does not use CPAP    Tobacco History: Tobacco Use History[2] Counseling given: Not Answered   Outpatient Medications Prior to Visit  Medication Sig Dispense Refill   amLODipine  (NORVASC ) 5 MG tablet Take 1 tablet (5 mg total) by mouth 2 (two) times daily. (schedule an office visit with DR. OKEY) 60 tablet 0   aspirin  EC 81 MG tablet Take 1 tablet (81 mg total) by mouth daily. Swallow whole.     escitalopram  (LEXAPRO ) 20 MG tablet Take 1 tablet (20 mg total) by mouth daily. (Patient taking differently: Take 20 mg by mouth at bedtime.) 90 tablet 3   famotidine  (PEPCID ) 20 MG tablet Take 20 mg by mouth daily as needed for heartburn or indigestion.     isosorbide  mononitrate (IMDUR ) 30 MG 24 hr tablet Take 0.5 tablets (15 mg total) by mouth daily. AS NEEDED (Patient taking differently: Take 15 mg by mouth daily as needed (for fatigue, to increase blood flow.).) 30 tablet 6   nitroGLYCERIN  (NITROSTAT ) 0.4 MG SL tablet Place 1 tablet (0.4 mg total) under the tongue every 5 (five) minutes as needed for chest pain. 25 tablet prn   prednisoLONE acetate (PRED FORTE) 1 % ophthalmic suspension Instill 1 drop into left eye every hour While awake today. Then 4 TIMES A DAY thereafter. (Patient not taking: Reported on 01/19/2024)     rosuvastatin  (CRESTOR ) 40 MG tablet Take 1 tablet (40mg ) by mouth daily. Appointment required for further refills. 90 tablet 0   tamsulosin  (FLOMAX ) 0.4 MG CAPS capsule Take one capsule once daily ( thirty minutes after same meal daily) (Patient taking differently: Take 0.4 mg by mouth at bedtime.)     TYLENOL  325 MG tablet Take 325-650 mg by mouth every 6 (six)  hours as needed for mild pain (pain score 1-3) (or headaches).     Vonoprazan Fumarate  (VOQUEZNA ) 10 MG TABS Take 10 mg by mouth daily. 90 tablet 3   No facility-administered medications prior to visit.   Review of Systems  Review of Systems  Constitutional: Negative.   HENT: Negative.         Dry mouth  Respiratory: Negative.    Cardiovascular: Negative.    Physical Exam  There were no vitals taken for this visit. Physical Exam Constitutional:      Appearance: Normal appearance. He is well-developed.  HENT:     Head: Normocephalic and atraumatic.     Mouth/Throat:     Mouth: Mucous membranes are moist.     Pharynx: Oropharynx is clear.  Cardiovascular:     Rate and Rhythm: Normal rate and regular rhythm.  Heart sounds: Normal heart sounds.  Pulmonary:     Effort: Pulmonary effort is normal. No respiratory distress.     Breath sounds: Normal breath sounds. No wheezing or rhonchi.  Musculoskeletal:        General: Normal range of motion.     Cervical back: Normal range of motion and neck supple.  Skin:    General: Skin is warm and dry.     Findings: No erythema or rash.  Neurological:     General: No focal deficit present.     Mental Status: He is alert and oriented to person, place, and time. Mental status is at baseline.  Psychiatric:        Mood and Affect: Mood normal.        Behavior: Behavior normal.        Thought Content: Thought content normal.        Judgment: Judgment normal.      Lab Results:  CBC    Component Value Date/Time   WBC 6.8 01/05/2024 0443   RBC 3.84 (L) 01/05/2024 0443   HGB 11.4 (L) 01/05/2024 0443   HGB 13.7 11/28/2022 1130   HCT 34.3 (L) 01/05/2024 0443   HCT 42.8 11/28/2022 1130   PLT 173 01/05/2024 0443   PLT 178 11/28/2022 1130   MCV 89.3 01/05/2024 0443   MCV 94 11/28/2022 1130   MCH 29.7 01/05/2024 0443   MCHC 33.2 01/05/2024 0443   RDW 14.6 01/05/2024 0443   RDW 12.9 11/28/2022 1130   LYMPHSABS 0.6 (L) 01/01/2024  2356   MONOABS 0.7 01/01/2024 2356   EOSABS 0.0 01/01/2024 2356   BASOSABS 0.0 01/01/2024 2356    BMET    Component Value Date/Time   NA 140 01/05/2024 0443   NA 141 08/24/2023 1239   K 3.2 (L) 01/05/2024 0443   CL 106 01/05/2024 0443   CO2 24 01/05/2024 0443   GLUCOSE 98 01/05/2024 0443   BUN 8 01/05/2024 0443   BUN 16 08/24/2023 1239   CREATININE 1.18 01/05/2024 0443   CALCIUM  9.0 01/05/2024 0443   GFRNONAA >60 01/05/2024 0443   GFRAA 52 (L) 01/13/2020 1138    BNP No results found for: BNP  ProBNP No results found for: PROBNP  Imaging: US  Guided Needle Placement - No Linked Charges Result Date: 02/19/2024 Ultrasound imaging demonstrates needle placement into the first extensor compartment with successful injection and no complication.  XR Wrist Complete Left Result Date: 02/19/2024 PA, oblique, lateral views of left wrist reviewed.  No fracture or dislocation.  Mild to moderate degenerative changes of STT joint with minimal degenerative changes of the first [facility] joint.  No other significant degenerative changes noted.    Assessment & Plan:   1. OSA (obstructive sleep apnea) (Primary)  Assessment and Plan Assessment & Plan Obstructive sleep apnea Managed with Inspire therapy. Current settings include a start delay of 20 minutes and a pause time of 15 minutes. Usage is 97% with more than 4 hours of use 90% of the time. Average pause is 0.4. No improvement in snoring noted, but no self-reported snoring or waking up gasping or choking. Current level is 4 (1.3V), with plans to increase to level 5 after one week. The goal is to gradually increase levels to find the optimal setting for apnea control.  - Increased start delay to 30 minutes. - Increased pause time to 30 minutes. - Continue on level 4 (1.3V) for one more week, then increase to level 5 (1.4V). - If  level 5 is uncomfortable, return to level 4. - If level 5 is tolerated, stay on it for two weeks before  increasing to level 6. - Plan for 4-6 week follow-up readiness check for titration study to determine optimal settings.  Dry mouth related to sleep apnea therapy Dry mouth noted upon waking, likely due to mouth opening during sleep. No significant issues reported, but recommendations for management were provided. - Recommended using Therabreath mouthwash for dry mouth. - Suggested using Zylo melts before bed to moisturize the mouth.  Recording duration: 14 minutes   Almarie LELON Ferrari, NP 02/23/2024     [1]  Allergies Allergen Reactions   Bee Venom Anaphylaxis and Shortness Of Breath   Penicillins Swelling, Rash and Other (See Comments)    As a child   [2]  Social History Tobacco Use  Smoking Status Former   Current packs/day: 0.00   Average packs/day: 2.0 packs/day for 25.0 years (50.0 ttl pk-yrs)   Types: Cigarettes   Start date: 03/11/1971   Quit date: 03/10/1996   Years since quitting: 27.9  Smokeless Tobacco Never   "

## 2024-02-26 ENCOUNTER — Telehealth: Payer: Self-pay | Admitting: Primary Care

## 2024-02-26 NOTE — Telephone Encounter (Signed)
 Left voicemail for patient to call back to schedule 4-6 week follow up for INSPIRE

## 2024-03-01 ENCOUNTER — Ambulatory Visit: Admitting: Surgical

## 2024-03-05 NOTE — Telephone Encounter (Signed)
 Patient scheduled 04/10/2024 with BW. Nothing further needed

## 2024-03-05 NOTE — Telephone Encounter (Signed)
 Hi Lauren, can you set Alexa up for appointment for de Quervain's injection with myself on Monday?  You can add him into my schedule

## 2024-03-06 ENCOUNTER — Other Ambulatory Visit: Payer: Self-pay | Admitting: Internal Medicine

## 2024-03-10 ENCOUNTER — Telehealth: Payer: Self-pay

## 2024-03-10 NOTE — Telephone Encounter (Signed)
 MYCHART MSG SENT ABOUT APPT

## 2024-03-11 ENCOUNTER — Encounter: Payer: Self-pay | Admitting: Nurse Practitioner

## 2024-03-11 ENCOUNTER — Ambulatory Visit: Admitting: Surgical

## 2024-03-11 ENCOUNTER — Ambulatory Visit: Admitting: Nurse Practitioner

## 2024-03-11 VITALS — BP 108/60 | HR 78 | Ht 69.0 in | Wt 174.0 lb

## 2024-03-11 DIAGNOSIS — E785 Hyperlipidemia, unspecified: Secondary | ICD-10-CM

## 2024-03-11 DIAGNOSIS — I2510 Atherosclerotic heart disease of native coronary artery without angina pectoris: Secondary | ICD-10-CM | POA: Diagnosis not present

## 2024-03-11 DIAGNOSIS — G4733 Obstructive sleep apnea (adult) (pediatric): Secondary | ICD-10-CM | POA: Diagnosis not present

## 2024-03-11 DIAGNOSIS — I10 Essential (primary) hypertension: Secondary | ICD-10-CM

## 2024-03-11 DIAGNOSIS — I671 Cerebral aneurysm, nonruptured: Secondary | ICD-10-CM | POA: Diagnosis not present

## 2024-03-11 DIAGNOSIS — I25119 Atherosclerotic heart disease of native coronary artery with unspecified angina pectoris: Secondary | ICD-10-CM | POA: Diagnosis not present

## 2024-03-11 DIAGNOSIS — N1831 Chronic kidney disease, stage 3a: Secondary | ICD-10-CM | POA: Diagnosis not present

## 2024-03-11 MED ORDER — AMLODIPINE BESYLATE 5 MG PO TABS: 5.0000 mg | ORAL_TABLET | Freq: Two times a day (BID) | ORAL | 3 refills | Status: AC

## 2024-03-11 MED ORDER — ISOSORBIDE MONONITRATE ER 30 MG PO TB24: 15.0000 mg | ORAL_TABLET | Freq: Every day | ORAL | 3 refills | Status: AC

## 2024-03-11 MED ORDER — ROSUVASTATIN CALCIUM 40 MG PO TABS: ORAL_TABLET | ORAL | 3 refills | Status: AC

## 2024-03-11 MED ORDER — NITROGLYCERIN 0.4 MG SL SUBL: 0.4000 mg | SUBLINGUAL_TABLET | SUBLINGUAL | 3 refills | Status: AC | PRN

## 2024-03-11 NOTE — Patient Instructions (Signed)
 Medication Instructions:  Your physician recommends that you continue on your current medications as directed. Please refer to the Current Medication list given to you today.  *If you need a refill on your cardiac medications before your next appointment, please call your pharmacy*  Lab Work: NONE ordered at this time of appointment   Testing/Procedures: NONE ordered at this time of appointment   Follow-Up: At [facility], you and your health needs are our priority.  As part of our continuing mission to provide you with exceptional heart care, our providers are all part of one team.  This team includes your primary Cardiologist (physician) and Advanced Practice Providers or APPs (Physician Assistants and Nurse Practitioners) who all work together to provide you with the care you need, when you need it.  Your next appointment:   1 year(s)  Provider:   Vina Gull, MD    We recommend signing up for the patient portal called MyChart.  Sign up information is provided on this After Visit Summary.  MyChart is used to connect with patients for Virtual Visits (Telemedicine).  Patients are able to view lab/test results, encounter notes, upcoming appointments, etc.  Non-urgent messages can be sent to your provider as well.   To learn more about what you can do with MyChart, go to [email or web address].

## 2024-03-14 ENCOUNTER — Other Ambulatory Visit: Payer: Self-pay

## 2024-03-14 ENCOUNTER — Ambulatory Visit: Admitting: Surgical

## 2024-03-14 DIAGNOSIS — M654 Radial styloid tenosynovitis [de Quervain]: Secondary | ICD-10-CM

## 2024-03-14 DIAGNOSIS — M25532 Pain in left wrist: Secondary | ICD-10-CM

## 2024-03-22 ENCOUNTER — Ambulatory Visit (INDEPENDENT_AMBULATORY_CARE_PROVIDER_SITE_OTHER)

## 2024-04-10 ENCOUNTER — Encounter: Admitting: Primary Care

## 2024-05-20 ENCOUNTER — Ambulatory Visit
